# Patient Record
Sex: Male | Born: 1945 | Race: White | Hispanic: No | Marital: Married | Smoking: Former smoker
Health system: Southern US, Community
[De-identification: ages and names within clinical notes are randomized; demographics above are authoritative.]

## PROBLEM LIST (undated history)

## (undated) DIAGNOSIS — I82409 Acute embolism and thrombosis of unspecified deep veins of unspecified lower extremity: Secondary | ICD-10-CM

## (undated) DIAGNOSIS — M2540 Effusion, unspecified joint: Secondary | ICD-10-CM

## (undated) DIAGNOSIS — N189 Chronic kidney disease, unspecified: Secondary | ICD-10-CM

## (undated) DIAGNOSIS — M1990 Unspecified osteoarthritis, unspecified site: Secondary | ICD-10-CM

## (undated) DIAGNOSIS — G4730 Sleep apnea, unspecified: Secondary | ICD-10-CM

## (undated) DIAGNOSIS — M2550 Pain in unspecified joint: Secondary | ICD-10-CM

## (undated) DIAGNOSIS — Z8719 Personal history of other diseases of the digestive system: Secondary | ICD-10-CM

## (undated) DIAGNOSIS — Z860100 Personal history of colon polyps, unspecified: Secondary | ICD-10-CM

## (undated) DIAGNOSIS — Z8711 Personal history of peptic ulcer disease: Secondary | ICD-10-CM

## (undated) DIAGNOSIS — I10 Essential (primary) hypertension: Secondary | ICD-10-CM

## (undated) DIAGNOSIS — E785 Hyperlipidemia, unspecified: Secondary | ICD-10-CM

## (undated) DIAGNOSIS — K219 Gastro-esophageal reflux disease without esophagitis: Secondary | ICD-10-CM

## (undated) DIAGNOSIS — Z8679 Personal history of other diseases of the circulatory system: Secondary | ICD-10-CM

## (undated) DIAGNOSIS — M109 Gout, unspecified: Secondary | ICD-10-CM

## (undated) DIAGNOSIS — I6529 Occlusion and stenosis of unspecified carotid artery: Secondary | ICD-10-CM

## (undated) DIAGNOSIS — Z9289 Personal history of other medical treatment: Secondary | ICD-10-CM

## (undated) HISTORY — DX: Essential (primary) hypertension: I10

## (undated) HISTORY — DX: Hyperlipidemia, unspecified: E78.5

## (undated) HISTORY — DX: Unspecified osteoarthritis, unspecified site: M19.90

## (undated) HISTORY — DX: Occlusion and stenosis of unspecified carotid artery: I65.29

## (undated) HISTORY — PX: ESOPHAGOGASTRODUODENOSCOPY: SHX1529

## (undated) HISTORY — DX: Personal history of other diseases of the circulatory system: Z86.79

## (undated) HISTORY — PX: COLONOSCOPY: SHX174

## (undated) HISTORY — PX: HERNIA REPAIR: SHX51

## (undated) HISTORY — DX: Acute embolism and thrombosis of unspecified deep veins of unspecified lower extremity: I82.409

## (undated) HISTORY — DX: Gout, unspecified: M10.9

## (undated) HISTORY — PX: COLON SURGERY: SHX602

## (undated) HISTORY — DX: Personal history of other diseases of the digestive system: Z87.19

## (undated) HISTORY — PX: CHOLECYSTECTOMY: SHX55

---

## 1998-12-11 ENCOUNTER — Ambulatory Visit (HOSPITAL_COMMUNITY): Admission: RE | Admit: 1998-12-11 | Discharge: 1998-12-11 | Payer: Self-pay | Admitting: Gastroenterology

## 1999-06-24 ENCOUNTER — Inpatient Hospital Stay (HOSPITAL_COMMUNITY): Admission: EM | Admit: 1999-06-24 | Discharge: 1999-06-27 | Payer: Self-pay | Admitting: Emergency Medicine

## 1999-06-25 ENCOUNTER — Encounter: Payer: Self-pay | Admitting: Gastroenterology

## 1999-06-26 ENCOUNTER — Encounter: Payer: Self-pay | Admitting: Gastroenterology

## 1999-07-28 HISTORY — PX: OTHER SURGICAL HISTORY: SHX169

## 1999-11-09 ENCOUNTER — Inpatient Hospital Stay (HOSPITAL_COMMUNITY): Admission: EM | Admit: 1999-11-09 | Discharge: 1999-11-11 | Payer: Self-pay | Admitting: Emergency Medicine

## 1999-11-09 ENCOUNTER — Encounter: Payer: Self-pay | Admitting: Emergency Medicine

## 1999-11-10 ENCOUNTER — Encounter: Payer: Self-pay | Admitting: Gastroenterology

## 1999-12-17 ENCOUNTER — Encounter: Payer: Self-pay | Admitting: Gastroenterology

## 1999-12-17 ENCOUNTER — Ambulatory Visit (HOSPITAL_COMMUNITY): Admission: RE | Admit: 1999-12-17 | Discharge: 1999-12-17 | Payer: Self-pay | Admitting: Gastroenterology

## 1999-12-17 ENCOUNTER — Inpatient Hospital Stay (HOSPITAL_COMMUNITY): Admission: EM | Admit: 1999-12-17 | Discharge: 1999-12-20 | Payer: Self-pay | Admitting: Emergency Medicine

## 1999-12-29 ENCOUNTER — Encounter: Payer: Self-pay | Admitting: Neurology

## 1999-12-29 ENCOUNTER — Encounter: Admission: RE | Admit: 1999-12-29 | Discharge: 1999-12-29 | Payer: Self-pay | Admitting: Neurology

## 2000-01-08 ENCOUNTER — Encounter: Payer: Self-pay | Admitting: Surgery

## 2000-01-12 ENCOUNTER — Encounter (INDEPENDENT_AMBULATORY_CARE_PROVIDER_SITE_OTHER): Payer: Self-pay | Admitting: *Deleted

## 2000-01-12 ENCOUNTER — Encounter: Payer: Self-pay | Admitting: Surgery

## 2000-01-12 ENCOUNTER — Inpatient Hospital Stay (HOSPITAL_COMMUNITY): Admission: RE | Admit: 2000-01-12 | Discharge: 2000-01-18 | Payer: Self-pay | Admitting: Surgery

## 2004-10-09 ENCOUNTER — Ambulatory Visit: Payer: Self-pay | Admitting: Oncology

## 2005-05-21 ENCOUNTER — Ambulatory Visit: Payer: Self-pay | Admitting: Oncology

## 2006-01-01 ENCOUNTER — Encounter: Admission: RE | Admit: 2006-01-01 | Discharge: 2006-01-01 | Payer: Self-pay | Admitting: Family Medicine

## 2006-04-02 ENCOUNTER — Ambulatory Visit (HOSPITAL_COMMUNITY): Admission: RE | Admit: 2006-04-02 | Discharge: 2006-04-02 | Payer: Self-pay | Admitting: Gastroenterology

## 2006-04-02 ENCOUNTER — Encounter (INDEPENDENT_AMBULATORY_CARE_PROVIDER_SITE_OTHER): Payer: Self-pay | Admitting: Specialist

## 2006-05-19 ENCOUNTER — Ambulatory Visit: Payer: Self-pay | Admitting: Oncology

## 2006-07-27 DIAGNOSIS — I82409 Acute embolism and thrombosis of unspecified deep veins of unspecified lower extremity: Secondary | ICD-10-CM

## 2006-07-27 HISTORY — PX: CAROTID ENDARTERECTOMY: SUR193

## 2006-07-27 HISTORY — DX: Acute embolism and thrombosis of unspecified deep veins of unspecified lower extremity: I82.409

## 2007-02-25 ENCOUNTER — Ambulatory Visit: Payer: Self-pay | Admitting: Vascular Surgery

## 2007-03-07 ENCOUNTER — Ambulatory Visit: Payer: Self-pay | Admitting: Surgery

## 2007-04-11 ENCOUNTER — Ambulatory Visit: Payer: Self-pay | Admitting: Surgery

## 2007-04-21 ENCOUNTER — Encounter: Payer: Self-pay | Admitting: Surgery

## 2007-04-21 ENCOUNTER — Ambulatory Visit: Payer: Self-pay | Admitting: Surgery

## 2007-04-21 ENCOUNTER — Inpatient Hospital Stay (HOSPITAL_COMMUNITY): Admission: AD | Admit: 2007-04-21 | Discharge: 2007-04-22 | Payer: Self-pay | Admitting: Surgery

## 2007-05-09 ENCOUNTER — Ambulatory Visit: Payer: Self-pay | Admitting: Surgery

## 2007-05-12 ENCOUNTER — Ambulatory Visit: Payer: Self-pay | Admitting: Oncology

## 2007-05-17 LAB — COMPREHENSIVE METABOLIC PANEL
ALT: 18 U/L (ref 0–53)
AST: 17 U/L (ref 0–37)
Albumin: 4.4 g/dL (ref 3.5–5.2)
Alkaline Phosphatase: 94 U/L (ref 39–117)
BUN: 27 mg/dL — ABNORMAL HIGH (ref 6–23)
CO2: 23 mEq/L (ref 19–32)
Calcium: 9.3 mg/dL (ref 8.4–10.5)
Chloride: 103 mEq/L (ref 96–112)
Creatinine, Ser: 1.59 mg/dL — ABNORMAL HIGH (ref 0.40–1.50)
Glucose, Bld: 99 mg/dL (ref 70–99)
Potassium: 4.5 mEq/L (ref 3.5–5.3)
Sodium: 138 mEq/L (ref 135–145)
Total Bilirubin: 0.6 mg/dL (ref 0.3–1.2)
Total Protein: 7.2 g/dL (ref 6.0–8.3)

## 2007-05-17 LAB — CBC WITH DIFFERENTIAL/PLATELET
BASO%: 0.7 % (ref 0.0–2.0)
Basophils Absolute: 0 10*3/uL (ref 0.0–0.1)
EOS%: 3.4 % (ref 0.0–7.0)
Eosinophils Absolute: 0.2 10*3/uL (ref 0.0–0.5)
HCT: 36.7 % — ABNORMAL LOW (ref 38.7–49.9)
HGB: 12.8 g/dL — ABNORMAL LOW (ref 13.0–17.1)
LYMPH%: 24.5 % (ref 14.0–48.0)
MCH: 28.4 pg (ref 28.0–33.4)
MCHC: 35 g/dL (ref 32.0–35.9)
MCV: 81.1 fL — ABNORMAL LOW (ref 81.6–98.0)
MONO#: 0.5 10*3/uL (ref 0.1–0.9)
MONO%: 8.4 % (ref 0.0–13.0)
NEUT#: 3.4 10*3/uL (ref 1.5–6.5)
NEUT%: 63 % (ref 40.0–75.0)
Platelets: 247 10*3/uL (ref 145–400)
RBC: 4.52 10*6/uL (ref 4.20–5.71)
RDW: 13.6 % (ref 11.2–14.6)
WBC: 5.4 10*3/uL (ref 4.0–10.0)
lymph#: 1.3 10*3/uL (ref 0.9–3.3)

## 2007-05-17 LAB — IRON AND TIBC
%SAT: 29 % (ref 20–55)
Iron: 88 ug/dL (ref 42–165)
TIBC: 307 ug/dL (ref 215–435)
UIBC: 219 ug/dL

## 2007-05-17 LAB — FERRITIN: Ferritin: 204 ng/mL (ref 22–322)

## 2007-05-17 LAB — LACTATE DEHYDROGENASE: LDH: 135 U/L (ref 94–250)

## 2007-05-19 LAB — TRANSFERRIN RECEPTOR, SOLUABLE: Transferrin Receptor, Soluble: 15.2 nmol/L

## 2007-07-28 HISTORY — PX: RENAL ARTERY STENT: SHX2321

## 2007-08-01 ENCOUNTER — Ambulatory Visit: Payer: Self-pay | Admitting: Surgery

## 2007-12-14 ENCOUNTER — Encounter: Admission: RE | Admit: 2007-12-14 | Discharge: 2007-12-14 | Payer: Self-pay | Admitting: Family Medicine

## 2007-12-29 ENCOUNTER — Encounter: Admission: RE | Admit: 2007-12-29 | Discharge: 2007-12-29 | Payer: Self-pay | Admitting: Family Medicine

## 2008-01-23 ENCOUNTER — Ambulatory Visit: Payer: Self-pay | Admitting: Surgery

## 2008-02-08 ENCOUNTER — Inpatient Hospital Stay (HOSPITAL_COMMUNITY): Admission: AD | Admit: 2008-02-08 | Discharge: 2008-02-09 | Payer: Self-pay | Admitting: Surgery

## 2008-02-09 ENCOUNTER — Ambulatory Visit: Payer: Self-pay | Admitting: Surgery

## 2008-03-19 ENCOUNTER — Ambulatory Visit: Payer: Self-pay | Admitting: Surgery

## 2008-05-16 ENCOUNTER — Ambulatory Visit: Payer: Self-pay | Admitting: Oncology

## 2008-05-18 LAB — CBC WITH DIFFERENTIAL/PLATELET
BASO%: 0.4 % (ref 0.0–2.0)
Basophils Absolute: 0 10*3/uL (ref 0.0–0.1)
EOS%: 3 % (ref 0.0–7.0)
Eosinophils Absolute: 0.2 10*3/uL (ref 0.0–0.5)
HCT: 41.4 % (ref 38.7–49.9)
HGB: 14.2 g/dL (ref 13.0–17.1)
LYMPH%: 20.2 % (ref 14.0–48.0)
MCH: 27.9 pg — ABNORMAL LOW (ref 28.0–33.4)
MCHC: 34.3 g/dL (ref 32.0–35.9)
MCV: 81.3 fL — ABNORMAL LOW (ref 81.6–98.0)
MONO#: 0.4 10*3/uL (ref 0.1–0.9)
MONO%: 7.2 % (ref 0.0–13.0)
NEUT#: 4.1 10*3/uL (ref 1.5–6.5)
NEUT%: 69.2 % (ref 40.0–75.0)
Platelets: 208 10*3/uL (ref 145–400)
RBC: 5.09 10*6/uL (ref 4.20–5.71)
RDW: 14.4 % (ref 11.2–14.6)
WBC: 5.9 10*3/uL (ref 4.0–10.0)
lymph#: 1.2 10*3/uL (ref 0.9–3.3)

## 2008-05-22 LAB — COMPREHENSIVE METABOLIC PANEL
ALT: 17 U/L (ref 0–53)
AST: 18 U/L (ref 0–37)
Albumin: 4.6 g/dL (ref 3.5–5.2)
Alkaline Phosphatase: 97 U/L (ref 39–117)
BUN: 18 mg/dL (ref 6–23)
CO2: 25 mEq/L (ref 19–32)
Calcium: 9.2 mg/dL (ref 8.4–10.5)
Chloride: 104 mEq/L (ref 96–112)
Creatinine, Ser: 1.44 mg/dL (ref 0.40–1.50)
Glucose, Bld: 111 mg/dL — ABNORMAL HIGH (ref 70–99)
Potassium: 4.2 mEq/L (ref 3.5–5.3)
Sodium: 140 mEq/L (ref 135–145)
Total Bilirubin: 0.7 mg/dL (ref 0.3–1.2)
Total Protein: 7.1 g/dL (ref 6.0–8.3)

## 2008-05-22 LAB — TRANSFERRIN RECEPTOR, SOLUABLE: Transferrin Receptor, Soluble: 22 nmol/L

## 2008-05-22 LAB — FERRITIN: Ferritin: 44 ng/mL (ref 22–322)

## 2008-05-22 LAB — IRON AND TIBC
%SAT: 23 % (ref 20–55)
Iron: 73 ug/dL (ref 42–165)
TIBC: 317 ug/dL (ref 215–435)
UIBC: 244 ug/dL

## 2008-05-22 LAB — LACTATE DEHYDROGENASE: LDH: 136 U/L (ref 94–250)

## 2008-09-17 ENCOUNTER — Ambulatory Visit: Payer: Self-pay | Admitting: Surgery

## 2009-03-26 ENCOUNTER — Ambulatory Visit: Payer: Self-pay | Admitting: Surgery

## 2009-05-22 ENCOUNTER — Ambulatory Visit: Payer: Self-pay | Admitting: Oncology

## 2009-05-24 LAB — CBC WITH DIFFERENTIAL/PLATELET
BASO%: 0.5 % (ref 0.0–2.0)
Basophils Absolute: 0 10*3/uL (ref 0.0–0.1)
EOS%: 2.2 % (ref 0.0–7.0)
Eosinophils Absolute: 0.2 10*3/uL (ref 0.0–0.5)
HCT: 44.3 % (ref 38.4–49.9)
HGB: 15.1 g/dL (ref 13.0–17.1)
LYMPH%: 22.1 % (ref 14.0–49.0)
MCH: 28.7 pg (ref 27.2–33.4)
MCHC: 34.1 g/dL (ref 32.0–36.0)
MCV: 84.1 fL (ref 79.3–98.0)
MONO#: 0.4 10*3/uL (ref 0.1–0.9)
MONO%: 5.7 % (ref 0.0–14.0)
NEUT#: 5.4 10*3/uL (ref 1.5–6.5)
NEUT%: 69.5 % (ref 39.0–75.0)
Platelets: 243 10*3/uL (ref 140–400)
RBC: 5.26 10*6/uL (ref 4.20–5.82)
RDW: 13.6 % (ref 11.0–14.6)
WBC: 7.8 10*3/uL (ref 4.0–10.3)
lymph#: 1.7 10*3/uL (ref 0.9–3.3)

## 2009-05-24 LAB — COMPREHENSIVE METABOLIC PANEL
ALT: 16 U/L (ref 0–53)
AST: 16 U/L (ref 0–37)
Albumin: 4.4 g/dL (ref 3.5–5.2)
Alkaline Phosphatase: 111 U/L (ref 39–117)
BUN: 14 mg/dL (ref 6–23)
CO2: 26 mEq/L (ref 19–32)
Calcium: 9.4 mg/dL (ref 8.4–10.5)
Chloride: 104 mEq/L (ref 96–112)
Creatinine, Ser: 1.2 mg/dL (ref 0.40–1.50)
Glucose, Bld: 112 mg/dL — ABNORMAL HIGH (ref 70–99)
Potassium: 4.1 mEq/L (ref 3.5–5.3)
Sodium: 141 mEq/L (ref 135–145)
Total Bilirubin: 0.7 mg/dL (ref 0.3–1.2)
Total Protein: 6.9 g/dL (ref 6.0–8.3)

## 2009-05-24 LAB — LACTATE DEHYDROGENASE: LDH: 152 U/L (ref 94–250)

## 2009-05-24 LAB — IRON AND TIBC
%SAT: 29 % (ref 20–55)
Iron: 93 ug/dL (ref 42–165)
TIBC: 318 ug/dL (ref 215–435)
UIBC: 225 ug/dL

## 2009-05-24 LAB — FERRITIN: Ferritin: 105 ng/mL (ref 22–322)

## 2009-09-23 ENCOUNTER — Ambulatory Visit: Payer: Self-pay | Admitting: Surgery

## 2010-04-04 ENCOUNTER — Ambulatory Visit: Payer: Self-pay | Admitting: Surgery

## 2010-05-21 ENCOUNTER — Ambulatory Visit: Payer: Self-pay | Admitting: Oncology

## 2010-05-23 LAB — CBC WITH DIFFERENTIAL/PLATELET
BASO%: 0.6 % (ref 0.0–2.0)
Basophils Absolute: 0 10*3/uL (ref 0.0–0.1)
EOS%: 3.1 % (ref 0.0–7.0)
Eosinophils Absolute: 0.2 10*3/uL (ref 0.0–0.5)
HCT: 41.5 % (ref 38.4–49.9)
HGB: 14.3 g/dL (ref 13.0–17.1)
LYMPH%: 22.3 % (ref 14.0–49.0)
MCH: 28.7 pg (ref 27.2–33.4)
MCHC: 34.5 g/dL (ref 32.0–36.0)
MCV: 83.3 fL (ref 79.3–98.0)
MONO#: 0.4 10*3/uL (ref 0.1–0.9)
MONO%: 7 % (ref 0.0–14.0)
NEUT#: 4.2 10*3/uL (ref 1.5–6.5)
NEUT%: 67 % (ref 39.0–75.0)
Platelets: 228 10*3/uL (ref 140–400)
RBC: 4.98 10*6/uL (ref 4.20–5.82)
RDW: 13.7 % (ref 11.0–14.6)
WBC: 6.2 10*3/uL (ref 4.0–10.3)
lymph#: 1.4 10*3/uL (ref 0.9–3.3)

## 2010-05-23 LAB — COMPREHENSIVE METABOLIC PANEL
ALT: 13 U/L (ref 0–53)
AST: 17 U/L (ref 0–37)
Albumin: 4.2 g/dL (ref 3.5–5.2)
Alkaline Phosphatase: 125 U/L — ABNORMAL HIGH (ref 39–117)
BUN: 14 mg/dL (ref 6–23)
CO2: 24 mEq/L (ref 19–32)
Calcium: 9.2 mg/dL (ref 8.4–10.5)
Chloride: 104 mEq/L (ref 96–112)
Creatinine, Ser: 1.26 mg/dL (ref 0.40–1.50)
Glucose, Bld: 106 mg/dL — ABNORMAL HIGH (ref 70–99)
Potassium: 3.9 mEq/L (ref 3.5–5.3)
Sodium: 140 mEq/L (ref 135–145)
Total Bilirubin: 0.8 mg/dL (ref 0.3–1.2)
Total Protein: 6.5 g/dL (ref 6.0–8.3)

## 2010-05-23 LAB — FERRITIN: Ferritin: 126 ng/mL (ref 22–322)

## 2010-05-23 LAB — IRON AND TIBC
%SAT: 21 % (ref 20–55)
Iron: 62 ug/dL (ref 42–165)
TIBC: 299 ug/dL (ref 215–435)
UIBC: 237 ug/dL

## 2010-08-17 ENCOUNTER — Encounter: Payer: Self-pay | Admitting: Interventional Radiology

## 2010-10-06 ENCOUNTER — Other Ambulatory Visit (INDEPENDENT_AMBULATORY_CARE_PROVIDER_SITE_OTHER): Payer: Medicare Other

## 2010-10-06 DIAGNOSIS — Z48812 Encounter for surgical aftercare following surgery on the circulatory system: Secondary | ICD-10-CM

## 2010-10-06 DIAGNOSIS — I6529 Occlusion and stenosis of unspecified carotid artery: Secondary | ICD-10-CM

## 2010-10-08 NOTE — Procedures (Unsigned)
CAROTID DUPLEX EXAM  INDICATION:  Right CEA 04/21/2007.  HISTORY: Diabetes:  No. Cardiac:  No. Hypertension:  Yes. Smoking:  Previous. Previous Surgery:  Right CEA. CV History: Amaurosis Fugax No, Paresthesias No, Hemiparesis No                                      RIGHT             LEFT Brachial systolic pressure:         162               162 Brachial Doppler waveforms:         WNL               WNL Vertebral direction of flow:        Antegrade         Antegrade DUPLEX VELOCITIES (cm/sec) CCA peak systolic                   131               123456 ECA peak systolic                   112               [id number] ICA peak systolic                   87                [id number] ICA end diastolic                   22                70 PLAQUE MORPHOLOGY:                                    Heterogeneous PLAQUE AMOUNT:                      N/A               Moderate PLAQUE LOCATION:                                      CCA, ICA, ECA  IMPRESSION: 1. Widely patent right common carotid artery without evidence of     restenosis or hyperplasia. 2. 60% to 79% left internal carotid artery stenosis beginning in the     common carotid artery.  This is an increase from prior exam of     09/23/2009. 3. Antegrade bilateral vertebral arteries. 4. Abnormal left external carotid artery. 5. Results were communicated to Dr. Trula Slade on 10/06/2010.  ___________________________________________ V. Leia Alf, MD  LT/MEDQ  D:  10/06/2010  T:  10/06/2010  Job:  [id number]

## 2010-12-09 NOTE — Procedures (Signed)
RENAL ARTERY DUPLEX EVALUATION   INDICATION:  Right renal artery stent.   HISTORY:  Diabetes:  No.  Cardiac:  No.  Hypertension:  Yes.  Smoking:  Previous.   RENAL ARTERY DUPLEX FINDINGS:  Aorta-Proximal:  58 cm/s  Aorta-Mid:  67 cm/s  Aorta-Distal:  104 cm/s  Celiac Artery Origin:  116 cm/s  SMA Origin:  209 cm/s                                    RIGHT               LEFT  Renal Artery Origin:             124 cm/s            154 cm/s  Renal Artery Proximal:           94 cm/s             135 cm/s  Renal Artery Mid:                54 cm/s             129 cm/s  Renal Artery Distal:             41 cm/s             83 cm/s  Hilar Acceleration Time (AT):  Renal-Aortic Ratio (RAR):        1.9                 2.3  Kidney Size:                     10.5 cm             0000000 cm  End Diastolic Ratio (EDR):  Resistive Index (RI):            0.77                0.69   IMPRESSION:  1. Patent right renal artery stent with no increase in Doppler      velocities of the bilateral renal arteries noted.  2. The right intrarenal resistive index is abnormal with the left      intrarenal resistive index and bilateral kidney length measurements      falling within normal limits.  3. Stable Doppler velocities noted when compared to the previous      studies.   ___________________________________________  V. Leia Alf, MD   CH/MEDQ  D:  03/26/2009  T:  03/26/2009  Job:  [id number]

## 2010-12-09 NOTE — Assessment & Plan Note (Signed)
OFFICE VISIT   James [name], James [name]  DOB:  1946/05/10                                       05/09/2007  [id number]   REASON FOR VISIT:  Status post right carotid endarterectomy.   HISTORY:  This is a 65 year old gentleman who underwent right carotid  endarterectomy with bovine pericardial patch performed on 04/21/2007.  This was done secondary to high grade stenosis as well as  atherosclerotic changes and Hollenhorst plaque on ophthalmologic exam.  Patient had an uncomplicated postoperative course and was discharged  home the day following surgery.  In the interim, since being home, he  has had no complaints.  He has not had any pain.  He has not had any  symptoms consistent with TIA or stroke.   PHYSICAL EXAMINATION:  Heart rate 63, respirations 18, blood pressure on  the left is 140/85, on the right is 147/82.  General:  He is well-  appearing in no acute distress.  Incision:  His right incision is well  healed.  There is no evidence of infection.  Neuro:  The patient has  normal strength, sensation, and motor function in his bilateral upper  and lower extremities.  His tongue is in the midline.  He does not have  any hoarseness.   DIAGNOSTIC STUDIES:  Duplex evaluation of the right carotid was  performed today with no evidence of restenosis in the right internal  carotid artery.   IMPRESSION:  Doing well status post right carotid end arterectomy.   PLAN:  Patient is instructed to continue taking an aspirin every day.  He will follow up in six months with repeat duplex ultrasound.   Eldridge Abrahams, MD  Electronically Signed   VWB/MEDQ  [name]:  05/09/2007  T:  05/10/2007  Job:  [id number]

## 2010-12-09 NOTE — Assessment & Plan Note (Signed)
OFFICE VISIT   KNIGHTON, GEYMAN  DOB:  [birth year]                                       03/07/2007  [id number]   HISTORY:  This is a 65 year old gentleman, referred for evaluation of  carotid disease by Dr. Laverta Baltimore.  Patient comes with an ultrasound.  Per the  patient's report, he was seen in July by the ophthalmologist, who  suggested he had atherosclerotic changes within his eyes.  He was  subsequently referred for a carotid duplex, which revealed high-grade  stenosis on the right and moderate on the left.  He denies having any  symptoms.  Specifically, he denies numbness or weakness in either  extremity.  He denies amaurosis fugax.  He denies episodes of aphasia.   REVIEW OF SYMPTOMS:  As per above.  Denies chest pain, weight-loss, no shortness of breath.  No GI or GU discomfort.  Neurologically, he is without complaints.  VASCULAR:  He denies mini-strokes, claudication.   PAST MEDICAL HISTORY:  Significant for high blood pressure, high  cholesterol.   PAST SURGICAL HISTORY:  Significant for cholecystectomy and small bowel  resection.   FAMILY HISTORY:  No history of heart disease, etc.   SOCIAL HISTORY:  He has a history of tobacco use.  He does not drink.   PHYSICAL EXAMINATION:  Heart rate 73, blood pressure left arm is 198/88,  blood pressure in the right arm is 182/89.  Generally, he is well-  appearing, in no acute distress.  HEENT:  Normocephalic, atraumatic.  Neck is without carotid bruits.  Cardiovascular is regular rate and  rhythm.  Pulmonary:  Respirations are nonlabored.  Abdomen is soft,  nontender, midline incision is well-healed.  Extremities are warm and  well-perfused.   DIAGNOSTIC STUDIES:  Duplex exam was reviewed today.  This revealed an  80-99% stenosis in the right internal carotid artery and 40-59% stenosis  in the left internal carotid artery.  The lesion in the right is in the  proximal internal carotid.   ASSESSMENT AND PLAN:  Asymptomatic carotid stenosis.   PLAN:  At this point in time, I recommended that the patient be placed  on an aspirin.  We talked about blood pressure management, as well as  cholesterol management.  We discussed the risks and benefits of an  operation, including risk of perioperative stroke, versus medical  management.  We also discussed incidence of nerve injury, as well as  bleeding and cardiac risks.  At this point in time, patient is leaning  towards an operation.  However, he has an appointment to see his  cardiologist this afternoon for cardiac evaluation.  He will call the  office once this has been done and schedule a followup appointment.   Eldridge Abrahams, MD  Electronically Signed   VWB/MEDQ  D:  03/07/2007  T:  03/09/2007  Job:  [id number]

## 2010-12-09 NOTE — Assessment & Plan Note (Signed)
OFFICE VISIT   JARQUEZ, MCKNIGHT D  DOB:  02-Apr-1946                                       09/17/2008  [id number]   REASON FOR VISIT:  Follow-up.   HISTORY:  This is a 64 year old gentleman who underwent a right carotid  endarterectomy on April 21, 2007, for asymptomatic disease.  He has  also undergone right renal artery stenting on February 08, 2008, for  hypertension and elevated creatinine.  He has had 1 episode of GI  bleeding which has been attributed to Plavix and he subsequently stopped  that.  He is continuing to take 2 baby aspirin.  He has had no  significant interval medical history.   PHYSICAL EXAMINATION:  Vital Signs:  Blood pressure is 154/79.  He is  well-appearing, in no distress.  Neck:  supple.  No JVD.  No carotid  bruits.  Incision is well-healed.  Cardiovascular:  Regular rate and  rhythm.  Abdomen:  Soft, nontender.  No bruits.  Extremities:  Warm and  well-perfused.   DIAGNOSTIC STUDIES:  Carotid duplex reveals widely patent right carotid  arterial system with no stenosis.  The left shows 40% to 59% stenosis.   Renal duplex reveals no evidence of right renal artery stenosis.   ASSESSMENT/PLAN:  Status post right carotid endarterectomy and right  renal artery stent.   Plan:  The patient will continue on carotid and renal ultrasound  protocols.  He will continue with his medical management of his vascular  disease including aspirin, blood pressure medication and cholesterol  regimen.  I will plan on seeing him back in 1 year, unless he has  interval issues with his duplex screening.   Eldridge Abrahams, MD  Electronically Signed   VWB/MEDQ  D:  09/17/2008  T:  09/18/2008  Job:  [id number]   cc:   Shanon Rosser, Dr.

## 2010-12-09 NOTE — Procedures (Signed)
CAROTID DUPLEX EXAM   INDICATION:  Follow up carotid artery disease.   HISTORY:  Diabetes:  No.  Cardiac:  No.  Hypertension:  Yes.  Smoking:  Previous.  Previous Surgery:  Right CEA with BPP 04/21/07 by Dr. Trula Slade.  CV History:  Asymptomatic.  Amaurosis Fugax No, Paresthesias No, Hemiparesis No.                                       RIGHT             LEFT  Brachial systolic pressure:         140               126  Brachial Doppler waveforms:         WNL               WNL  Vertebral direction of flow:        Antegrade         Antegrade  DUPLEX VELOCITIES (cm/sec)  CCA peak systolic                   136               123456  ECA peak systolic                   109               [id number]  ICA peak systolic                   97                [id number]  ICA end diastolic                   26                55  PLAQUE MORPHOLOGY:                                    Heterogenous  PLAQUE AMOUNT:                      None              Moderate  PLAQUE LOCATION:                                      ICA, ECA   IMPRESSION:  1. Right internal carotid artery shows no evidence of restenosis,      status post carotid endarterectomy.  2. Left internal carotid artery shows evidence of 40-59% stenosis.  3. Left external carotid artery stenosis.  4. No significant changes from previous study, 09/17/2008.   ___________________________________________  V. Leia Alf, MD   AS/MEDQ  D:  09/23/2009  T:  09/23/2009  Job:  [id number]

## 2010-12-09 NOTE — Assessment & Plan Note (Signed)
OFFICE VISIT   James [name], James [name]  DOB:  [birth year]                                       01/23/2008  [id number]   REASON FOR VISIT:  Renal artery stenosis.   HISTORY:  This is a 65 year old gentleman who underwent right carotid  endarterectomy on 04/21/2007.  He has done well from this perspective  and comes in now today to evaluate for right renal artery stenosis.  He  recently has had an increase in his creatinine.  He underwent a renal  duplex which showed a decrease in size of his right kidney, and an MRI  suggested high grade right renal artery stenosis.  He comes in today for  followup.  He is on 2 medicines for blood pressure.  His blood pressure  has been relatively well controlled.  He has had an increase in his  creatinine to 1.7.  Otherwise he is doing well without complaints.   EXAM:  Blood pressure 160/81, pulse 81.  General:  Well-appearing, no  acute distress.  His right carotid endarterectomy incision is well  healed.  He is neurologically intact.  There are no abdominal bruits.   ASSESSMENT/PLAN:  Right renal stenosis.   PLAN:  The patient will be scheduled for an arteriogram with possible  stenting of his right renal artery.  I have scheduled this for Thursday,  July 16th.  Given the patient's increase in his creatinine, he will be  admitted today prior to this procedure for IV hydration.  At that time  he within also receive Mucomyst with the plan of doing bicarb the  morning of his procedure.   Eldridge Abrahams, MD  Electronically Signed   VWB/MEDQ  [name]:  01/23/2008  T:  01/24/2008  Job:  [id number]   cc:   Dr. Shanon Rosser

## 2010-12-09 NOTE — Op Note (Signed)
NAMETERROL, PUSKARICH           ACCOUNT NO.:  [id number]   MEDICAL RECORD NO.:  [id number]          PATIENT TYPE:  OIB   LOCATION:  [id number]                         FACILITY:  [facility]   PHYSICIAN:  Theotis Burrow IV, MDDATE OF BIRTH:  Oct 07, 1945   DATE OF PROCEDURE:  DATE OF DISCHARGE:                               OPERATIVE REPORT   PREOPERATIVE DIAGNOSIS:  Right carotid stenosis.   POSTOPERATIVE DIAGNOSIS:  Right carotid stenosis.   PROCEDURE:  Right carotid endarterectomy with bovine pericardial patch.   CO-SURGEONJudeth Cornfield. Scot Dock, M.D.   ANESTHESIA:  General.   ESTIMATED BLOOD LOSS:  150 mL.   DRAINS:  Jackson-Pratt in right neck.   FINDINGS:  95% stenosis in the right internal carotid.   INDICATIONS FOR PROCEDURE:  This is a 65 year old who initially  presented to me with an ultrasound finding of 80-99% stenosis.  At this  time, patient was asymptomatic.  We had scheduled him for carotid  endarterectomy today.  Upon interviewing him prior to the operation, he  apparently has had some periodic numbness in his left hand over the  weekend.  I am not sure if this represents symptomatic TIAs or not.  Regardless, he had no deficits prior to his surgery and therefore we  have proceeded.   DESCRIPTION OF PROCEDURE:  The patient was identified in the holding  area and taken to room 8.  He was placed supine on the table.  General  endotracheal anesthesia was administered.  A time-out was called and  perioperative antibiotics were administered.  Incision was made along  the anterior border of the sternocleidomastoid.  Bovie cautery was used  to dissect through the subcutaneous tissue and divide the platysma  muscle.  Weitlaner retractors were then placed.  The internal jugular  vein was identified and mobilized into the lateral aspect of the wound.  Dissection was then carried down sharply to the common carotid artery.  The common carotid artery was then exposed  proximally and distally and  an umbilical tape was passed.  The common facial vein was then  identified and divided between 2-0 silk ties.  At this point, the  superior thyroid and external carotid artery were individually isolated  and a red vest loop was placed around the external carotid and a 2-0 tie  around the superior thyroid artery.  Next, I began dissecting the  internal carotid distal.  This was a very high bifurcation of his  carotid.  The hypoglossal nerve was identified and skeletonized and this  enabled [place] to get to a distance of healthy appearing internal carotid  artery.  Once this was done and felt there was adequate exposure to the  internal carotid, patient was systemically heparinized.  Stump pressures  were checked.  Stump pressure had a mean of 40.  For that reason, I  elected not to shunt given the location of the lesion, as well as  adequate perfusion pressure by stump pressure measurements.  Fogarty  clamp was placed proximally on the common carotid artery.  Vessels were  tightened on the external and superior thyroid and a Gregory clamp  was  placed on the internal carotid.  A #11 blade was used to make an  arteriotomy and this was extended with Pott scissors.  A Penfield  elevator was used to perform an endarterectomy.  Heparinized saline was  then used to irrigate the endarterectomized bed to make sure there were  no intimal flaps.  The distal end of the endarterectomy had to be tacked  down with three 7-0 Prolene sutures.  Next, a Bovie pericardial patch  was selected.  It was cut to fit the size of the arteriotomy.  It was  sutured into place using a 6-0 Prolene.  Prior to completion of the  anastomosis, the internal carotid was opened for back-bleeding to flush  the internal carotid out.  This was then reclamped, following by  flushing of the external carotid and the common carotid arteries.  The  anastomosis was completed and the clamp was released, first off  the  internal carotid, followed by the common carotid artery, and ultimately  internal carotid.  Two repair sutures had to be placed at the toe of the  graft.  At this point in time, everything was hemostatic.  Continuous  wave Doppler was used to evaluate the signal in the common, external and  internal carotid arteries, which were normal in auscultation.  Next, the  wound was copiously irrigated.  A drain was placed.  The fascia of the  sternocleidomastoid was reapproximated with interrupted 3-0 Vicryl.  The  platysma muscle was then closed using interrupted 3-0 Vicryl.  The skin  was closed with a running 4-0 Vicryl.  The drain was sutured into place  with a 2-0 nylon.  Dermabond was used on the skin.  Patient tolerated  the procedure well.  He was successfully extubated and found to be  moving all 4 extremities.  He was then taken to the PACU in stable  condition.      Eldridge Abrahams, MD  Electronically Signed     VWB/MEDQ  D:  04/21/2007  T:  04/21/2007  Job:  [id number]

## 2010-12-09 NOTE — Procedures (Signed)
CAROTID DUPLEX EXAM   INDICATION:  Left eye problem (per eye doctor).   HISTORY:  Diabetes:  No  Cardiac:  No  Hypertension:  Yes  Smoking:  Quit in 2001 after smoking 3 packs per day for 15-20 years  Previous Surgery:  No  CV History:  Amaurosis Fugax No  Paresthesias No  Hemiparesis No                                       RIGHT             LEFT  Brachial systolic pressure:         156               150  Brachial Doppler waveforms:         Triphasic         Triphasic  Vertebral direction of flow:        Antegrade         Antegrade  DUPLEX VELOCITIES (cm/sec)  CCA peak systolic                   114               123456  ECA peak systolic                   129               [id number]  ICA peak systolic                   450               123456  ICA end diastolic                   199               58  PLAQUE MORPHOLOGY:                  Calcified         Mixed  PLAQUE AMOUNT:                      Large             Mild to moderate  PLAQUE LOCATION:                    ICA               ICA   IMPRESSION:  80-99% right internal carotid artery stenosis.  40-59% left internal carotid artery stenosis.  A preliminary report was called to Dr. Hayden Pedro office, and they requested  that the patient be seen by one of our surgeons in the near future.  The  patient was set up with a follow-up appointment a week from Monday,  which will be March 07, 2007.   ___________________________________________  Nelda Severe. Kellie Simmering, M.D.   DP/MEDQ  D:  02/25/2007  T:  02/26/2007  Job:  [id number]

## 2010-12-09 NOTE — Assessment & Plan Note (Signed)
OFFICE VISIT   SIG, CLISHAM D  DOB:  1945/11/04                                       03/19/2008  [id number]   REASON FOR VISIT:  Followup.   HISTORY:  This is a 65 year old gentleman who is status post right  carotid endarterectomy on April 21, 2007 for asymptomatic disease.  He most recently was found to have an increase in his creatinine as well  as a renal duplex which shows a right renal artery stenosis which was  also suggested by MRI.  He underwent arteriogram and stenting of right  renal artery stenosis on February 08, 2008.  Comes back in today for  followup.  He states he did have an episode of GI bleeding, which has  been attributed to Plavix, which he has stopped.  He does continue to  take an aspirin.   PHYSICAL EXAMINATION:  He is in no acute distress.  His blood pressure  is 159/82, pulse 69.  His extremities are warm and well perfused.  He is  neurologically intact.   DIAGNOSTIC STUDIES:  The patient had carotid duplexes to be his 1-year  study which shows no evidence of restenosis status post carotid  endarterectomy.  The left side shows 40-59% stenosis at the lower end of  the range.  Renal duplex was also performed.  The right kidney measures  9.1 cm compared the left at 11.6 cm.  There is no evidence of renal  artery stenosis.   ASSESSMENT/PLAN:  Status post right carotid endarterectomy and right  renal artery stent.   PLAN:  1. Carotid stenosis:  The patient's carotid arteries are widely patent      by duplex today.  He will continue on a carotid ultrasound      protocol.  2. Renal stenosis.  The patient's renal stenosis has been corrected by      a, right renal stenting.  He did have an episode of GI bleeding and      has now stopped his Plavix.  We discussed that he should at least      continue his aspirin therapy.  I am a little concerned that he is      stopping his Plavix.  However, the GI bleed seems to be a  more      pressing issue.  He will have follow up renal duplex in 6 months.   Eldridge Abrahams, MD  Electronically Signed   VWB/MEDQ  D:  03/19/2008  T:  03/20/2008  Job:  [id number]   cc:   Dr. Shanon Rosser

## 2010-12-09 NOTE — Procedures (Signed)
RENAL ARTERY DUPLEX EVALUATION   INDICATION:  Right renal artery stent.   HISTORY:  Diabetes:  No.  Cardiac:  No.  Hypertension:  Yes.  Smoking:  Previous.   RENAL ARTERY DUPLEX FINDINGS:  Aorta-Proximal:  80 cm/s  Aorta-Mid:  82 cm/s  Aorta-Distal:  105 cm/s  Celiac Artery Origin:  119 cm/s  SMA Origin:  209 cm/s                                    RIGHT               LEFT  Renal Artery Origin:             140 cm/s            131 cm/s  Renal Artery Proximal:           123 cm/s            127 cm/s  Renal Artery Mid:                72 cm/s             106 cm/s  Renal Artery Distal:             69 cm/s             71 cm/s  Hilar Acceleration Time (AT):  Renal-Aortic Ratio (RAR):        1.75                1.64  Kidney Size:                     10.6 cm             [id number] cm  End Diastolic Ratio (EDR):  Resistive Index (RI):            0.76                0.74   IMPRESSION:  1. Patent right renal artery stent with no increase in Doppler      velocities of the bilateral renal arteries noted.  2. Right resistive index is slightly abnormal.  Left is within normal      limits.  3. Bilateral kidneys appear within normal limits with respect to shape      and size.  4. No significant changes from previous study of 03/26/2009.   ___________________________________________  V. Leia Alf, MD   AS/MEDQ  D:  09/23/2009  T:  09/23/2009  Job:  [id number]

## 2010-12-09 NOTE — Discharge Summary (Signed)
NAMEABHAY, PIASECKI           ACCOUNT NO.:  [id number]   MEDICAL RECORD NO.:  [id number]          PATIENT TYPE:  INP   LOCATION:  [id number]                         FACILITY:  [facility]   PHYSICIAN:  Theotis Burrow IV, MDDATE OF BIRTH:  May 27, 1946   DATE OF ADMISSION:  04/21/2007  DATE OF DISCHARGE:  04/22/2007                               DISCHARGE SUMMARY   REASON FOR ADMISSION:  Right carotid stenosis.   PROCEDURES PERFORMED:  On September 25, patient underwent right carotid  endarterectomy without complication.   SUMMARY OF HOSPITAL COURSE:  A 65 year old gentleman who was admitted  the day following a right carotid endarterectomy which went without  difficulty.  Postoperatively, he was found to be neurologically intact.  He has done quite well overnight.  His drain was removed on the day  following surgery with normal output.  Again, he is neurologically  intact.  Patient will be scheduled to go home today.   DISCHARGE INSTRUCTIONS:  1. The patient is allowed to shower.  2. He should avoid driving while on narcotics.  3. He should gently cleanse the wound.  4. He should call if any signs of weakness, numbness or neurologic      deficit, otherwise he will follow up in two weeks' time.   DISCHARGE MEDICATIONS:  The patient will be discharged on his home  medications which include:  1. The only new medicine is Percocet.  2. He will also be on aspirin and resume all home medicines.      Eldridge Abrahams, MD  Electronically Signed     VWB/MEDQ  D:  04/22/2007  T:  04/22/2007  Job:  [id number]

## 2010-12-09 NOTE — Procedures (Signed)
CAROTID DUPLEX EXAM   INDICATION:  Followup evaluation of known carotid artery disease.   HISTORY:  Diabetes:  No.  Cardiac:  No.  Hypertension:  Yes.  Smoking:  Quit in 2001.  Previous Surgery:  Right carotid endarterectomy with bovine pericardial  patch, done on 04/21/2007, by Dr. Trula Slade.  CV History:  Patient reports no cerebrovascular symptoms at this time.  Amaurosis Fugax:  No, Paresthesias:  No, Hemiparesis:  No                                       RIGHT             LEFT  Brachial systolic pressure:         144               130  Brachial Doppler waveforms:         Triphasic         Triphasic  Vertebral direction of flow:        Antegrade         Antegrade  DUPLEX VELOCITIES (cm/sec)  CCA peak systolic                   86                [id number]  ECA peak systolic                   159               97  ICA peak systolic                   77                [id number]  ICA end diastolic                   18                26  PLAQUE MORPHOLOGY:                  Soft              Soft  PLAQUE AMOUNT:                      Mild              Mild  PLAQUE LOCATION:                    Proximal ECA      Proximal ICA   IMPRESSION:  1. No right internal carotid artery stenosis, status post      endarterectomy.  2. 20% to 39% left internal carotid artery stenosis.  3. No significant change on the right side since previous study      performed 05/09/2007.   ___________________________________________  V. Leia Alf, MD   MC/MEDQ  D:  08/01/2007  T:  08/02/2007  Job:  [id number]

## 2010-12-09 NOTE — Procedures (Signed)
RENAL ARTERY DUPLEX EVALUATION   INDICATION:  Followup right renal artery stent.   HISTORY:  Diabetes:  No.  Cardiac:  No.  Hypertension:  Yes.  Smoking:  Previous.   RENAL ARTERY DUPLEX FINDINGS:  Aorta-Proximal:  53 cm/s  Aorta-Mid:  79 cm/s  Aorta-Distal:  114 cm/s  Celiac Artery Origin:  106 cm/s  SMA Origin:  156 cm/s                                    RIGHT               LEFT  Renal Artery Origin:             158 cm/s            93 cm/s  Renal Artery Proximal:           136 cm/s            96 cm/s  Renal Artery Mid:                82 cm/s             73 cm/s  Renal Artery Distal:             45 cm/s             73 cm/s  Hilar Acceleration Time (AT):  Renal-Aortic Ratio (RAR):        2.0                 1.2  Kidney Size:                     9.9 cm              [id number] cm  End Diastolic Ratio (EDR):  Resistive Index (RI):            0.74                0.74   IMPRESSION:  1. Patent right renal artery stent with no significantly increased      velocities of the bilateral renal arteries noted.  2. The bilateral kidney length measurements and intrarenal resistive      indices are within normal limits.  3. No significant change noted when compared to the previous exam on      09/23/2009.  4. Incidental finding:  There is a cystic structure noted on the upper      pole of the right kidney measuring 1.7 x 1.2 cm.   ___________________________________________  V. Leia Alf, MD   CH/MEDQ  D:  04/04/2010  T:  04/04/2010  Job:  [id number]

## 2010-12-09 NOTE — Procedures (Signed)
CAROTID DUPLEX EXAM   INDICATION:  Follow up of known carotid artery disease.  Patient is  asymptomatic.   HISTORY:  Diabetes:  No.  Cardiac:  No.  Hypertension:  Yes.  Smoking:  Quit in 2001.  Previous Surgery:  Right CEA with bovine paracardial patch done on  04/21/2007 by Dr. Trula Slade IV.  CV History:  Amaurosis Fugax No, Paresthesias No, Hemiparesis No                                       RIGHT             LEFT  Brachial systolic pressure:         130               130  Brachial Doppler waveforms:         WNL  Vertebral direction of flow:        Antegrade  DUPLEX VELOCITIES (cm/sec)  CCA peak systolic                   96  ECA peak systolic                   64  ICA peak systolic                   53  ICA end diastolic                   20  PLAQUE MORPHOLOGY:                  N/A  PLAQUE AMOUNT:                      N/A  PLAQUE LOCATION:                    N/A   IMPRESSION:  No restenosis in right internal carotid artery, status post  carotid endarterectomy.   ___________________________________________  V. Leia Alf, MD   PB/MEDQ  D:  05/09/2007  T:  05/10/2007  Job:  [id number]

## 2010-12-09 NOTE — Assessment & Plan Note (Signed)
OFFICE VISIT   James [name], James [name]  DOB:  1946/02/07                                       04/11/2007  [id number]   REASON FOR VISIT:  Follow up carotid disease.   HISTORY:  This is a 65 year old gentleman whom I saw last month for  evaluation of asymptomatic right carotid stenosis.  In the interim, he  has been evaluated by cardiology and found to have no significant  ischemia demonstrated on his workup.  He comes in to further discuss  carotid endarterectomy.  After discussion regarding the risks and  benefits, he elected to proceed with carotid endarterectomy.  He has a  right-sided stenosis measuring 80-90%.  He has been scheduled for a  right carotid endarterectomy to be performed Thursday September 25.   Eldridge Abrahams, MD  Electronically Signed   VWB/MEDQ  [name]:  04/11/2007  T:  04/12/2007  Job:  [id number]

## 2010-12-09 NOTE — Procedures (Signed)
CAROTID DUPLEX EXAM   INDICATION:  Follow up carotid artery disease.   HISTORY:  Diabetes:  No.  Cardiac:  No.  Hypertension:  Yes.  Smoking:  Quit.  Previous Surgery:  Right CEA with bovine pericardial patch on 04/21/07  by Dr. Trula Slade IV.  CV History:  No.  Amaurosis Fugax No, Paresthesias No, Hemiparesis No.                                       RIGHT             LEFT  Brachial systolic pressure:         148               132  Brachial Doppler waveforms:         Triphasic         Triphasic  Vertebral direction of flow:        Antegrade         Antegrade  DUPLEX VELOCITIES (cm/sec)  CCA peak systolic                   97                97  ECA peak systolic                   102               [id number]  ICA peak systolic                   84                [id number]  ICA end diastolic                   30                50  PLAQUE MORPHOLOGY:                  N/A               Homogenous  PLAQUE AMOUNT:                      N/A               Mild  PLAQUE LOCATION:                    ECA               ICA/bifurcation   IMPRESSION:  1. Right internal carotid artery shows no evidence of restenosis,      status post carotid endarterectomy.  2. Left internal carotid artery shows evidence of 40-59% stenosis (low      end of range).  This is an increase from previous study; however,      consistent with study from one year ago.   ___________________________________________  V. Leia Alf, MD   AS/MEDQ  D:  03/19/2008  T:  03/19/2008  Job:  [id number]

## 2010-12-09 NOTE — Procedures (Signed)
CAROTID DUPLEX EXAM   INDICATION:  Follow-up carotid artery disease.   HISTORY:  Diabetes:  No.  Cardiac:  No.  Hypertension:  Yes.  Smoking:  Quit.  Previous Surgery:  Right carotid endarterectomy with bovine pericardial  patch on 04/21/2007 by Dr. Trula Slade.  CV History:  No.  Amaurosis Fugax No, Paresthesias No, Hemiparesis No.                                       RIGHT             LEFT  Brachial systolic pressure:  Brachial Doppler waveforms:  Vertebral direction of flow:        Antegrade         Antegrade  DUPLEX VELOCITIES (cm/sec)  CCA peak systolic                   122               [id number]  ECA peak systolic                   156               [id number]  ICA peak systolic                   110               0000000  ICA end diastolic                   33                50  PLAQUE MORPHOLOGY:                  None              Heterogeneous  PLAQUE AMOUNT:                      None              Mild  PLAQUE LOCATION:                    None              BIF/ICA   IMPRESSION:  1. Patent right internal carotid artery with no evidence of      restenosis.  2. A 40% to 59% stenosis of the left internal carotid artery.   ___________________________________________  V. Leia Alf, MD   AC/MEDQ  D:  09/17/2008  T:  09/17/2008  Job:  [id number]

## 2010-12-09 NOTE — Assessment & Plan Note (Signed)
OFFICE VISIT   James [name], James [name]  DOB:  07-18-1946                                       09/23/2009  [id number]   REASON FOR VISIT:  Follow-up.   Patient is a 65 year old gentleman who is status post right carotid  endarterectomy on 04/21/07 for asymptomatic disease.  He also has  undergone stenting of his right renal artery on 02/08/08 for  hypertension and elevated creatinine.  He has been unable to take Plavix  secondary to GI bleed.  He comes back in today for follow-up.  There has  been no significant interval change since I last saw him.   REVIEW OF SYSTEMS:  Negative x10, as documented in the encounter form.   SOCIAL HISTORY:  He continues to be a nonsmoker.  Does not drink  alcohol.   PAST MEDICAL HISTORY:  Hypertension, hypercholesterolemia.   PHYSICAL EXAMINATION:  Heart rate 65, blood pressure 146/71 in the right  arm and 149/71 in the left.  Temperature is 98.1.  general:  He is well-  appearing in no distress.  HEENT:  Within normal limits.  Lungs are  clear bilaterally.  No wheezes or rhonchi.  Cardiovascular:  He has a  regular rate and rhythm.  No carotid bruits.  The right carotid incision  is well-healed.  Abdomen:  Soft, nontender.  No pulsatile mass.  Musculoskeletal is without major deformity.  Neuro:  He has no focal  weakness.  Skin is without rash.  Lower extremities reveal mild pitting  edema up to the mid calf.   DIAGNOSTIC STUDIES:  I have independently reviewed his renal artery  duplex which shows no significant stenosis within the stent.  The left  renal artery is widely open.  This is not significantly changed from his  previous study.   Carotid duplex shows no evidence of restenosis, status post carotid  endarterectomy.  The left internal carotid stenosis is stable at 40-59%  stenosis.   ASSESSMENT/PLAN:  1. Renal artery stenosis:  The patient's renal duplex suggested the      stent is widely patent  without evidence of restenosis.  His      creatinine and blood pressure have been stable, according to the      patient.  We will continue with his protocol scan.  The next 1 is      in 6 months.  2. Carotid stenosis:  The patient remains asymptomatic.  The      endarterectomy side on the right is widely patent without evidence      of restenosis.  The left has stable disease in the 40-60% range.      He will be on protocol scan.  I will see him in 1 year.  We will      see him in 1 year for follow-up appointment.     Eldridge Abrahams, MD  Electronically Signed   VWB/MEDQ  [name]:  09/23/2009  T:  09/24/2009  Job:  [id number]   cc:   Chipper Herb, M.[name].  [place]

## 2010-12-09 NOTE — Procedures (Signed)
RENAL ARTERY DUPLEX EVALUATION   INDICATION:  Status post right renal artery stent.   HISTORY:  Diabetes:  No.  Cardiac:  No.  Hypertension:  Yes.  Smoking:  Quit.   RENAL ARTERY DUPLEX FINDINGS:  Aorta-Proximal:  59 cm/s  Aorta-Mid:  56 cm/s  Aorta-Distal:  68 cm/s  Celiac Artery Origin:  179 cm/s  SMA Origin:  203 cm/s                                    RIGHT               LEFT  Renal Artery Origin:             178 cm/s            210 cm/s  Renal Artery Proximal:           157 cm/s            183 cm/s  Renal Artery Mid:                145 cm/s            168 cm/s  Renal Artery Distal:             73 cm/s             107 cm/s  Hilar Acceleration Time (AT):  Renal-Aortic Ratio (RAR):        3.02                3.56  Kidney Size:                     11.1 cm             123456 cm  End Diastolic Ratio (EDR):  Resistive Index (RI):            0.63                0.70   IMPRESSION:  1. Right renal-aortic ratio suggests 40-60% stenosis.  2. Left renal-aortic ratio suggests >60% stenosis.  3. Normal kidney size noted bilaterally.   ___________________________________________  V. Leia Alf, MD   AC/MEDQ  D:  09/17/2008  T:  09/17/2008  Job:  [id number]

## 2010-12-09 NOTE — Op Note (Signed)
NAMEHAZEN, PLOWDEN           ACCOUNT NO.:  [id number]   MEDICAL RECORD NO.:  [id number]          PATIENT TYPE:  INP   LOCATION:  2040                         FACILITY:  [facility]   PHYSICIAN:  Theotis Burrow IV, MDDATE OF BIRTH:  [birth year]   DATE OF PROCEDURE:  02/08/2008  DATE OF DISCHARGE:                               OPERATIVE REPORT   PREPROCEDURE DIAGNOSIS:  Right renal stenosis.   POSTPROCEDURE DIAGNOSIS:  Right renal stenosis.   PROCEDURES PERFORMED:  1. Ultrasound access, right common femoral artery.  2. Abdominal aortogram.  3. First-order catheterization (right renal artery).  4. Right renal artery angiogram.  5. Right renal artery stent.  6. Retrograde right femoral artery closure with right femoral artery      angiogram.  7. Closure device (Star-Close).   HISTORY:  This is a 65 year old gentleman with hypertension who has  recently undergone imaging that has revealed a decreased size in his  right kidney as well as a high-grade stenosis at the origin of his right  renal artery.  His creatinine is 1.7.  He comes in today for his  arteriogram with a possibility of right renal artery intervention.   OPERATIVE PROCEDURE:  After informed consent, the patient was taken to  room #8, where he was placed supine on the table.  Bilateral groins were  prepped and draped in standard sterile fashion.  A time-out was called.  The right common femoral artery was evaluated with ultrasound and found  to be patent.  A 1% lidocaine was used for local anesthesia.  Using  ultrasound, the right common femoral artery was accessed with an 18-  gauge needle and an 0.035 wire was advanced into the abdomen under  fluoroscopic visualization.  A 6-French sheath was then placed.  Over  the wire, a pigtail catheter was placed at the level L1 and abdominal  aortogram was obtained.   FINDINGS:  Aortogram:  The visualized portion of the suprarenal  abdominal aorta showed minimal disease.   There is high-grade stenosis at  the origin of right renal artery.  The left renal artery is widely  patent.   At this point in time, I elected to proceed with intervention on the  right renal artery.  The patient was given systemic heparinization.  A  JR4 guide catheter was advanced over the wire and placed near the  orifice of the right renal artery.  Using the Montrose catheter and a  stabilizer 0.014 wire, the right renal artery was selected.  Catheter  was advanced out to the origin of the right renal artery and a right  renal angiogram was obtained.  This identified high-grade stenosis at  the ostium of the right renal artery.  The lesion was primarily stented.  A Genesis 6 x 12 stent was placed across the lesion.  The balloon was  taken to 10 atmospheres.  Followup angiogram reveals widely patent right  renal artery.  At this point in time, I was satisfied with the results.  Catheters and wires were removed.  A retrograde right femoral angiogram  was obtained for closure device and found to be  adequate and Star-Close  was successfully deployed.  The patient was taken to the recovery room  in stable condition.  There were no complications.  Again, the patient  was systemically heparinized for this procedure.   FINDINGS:  A 95% right renal artery stenosis, successfully treated with  a Genesis 6 x 12 balloon expandable stent.  Residual stenosis was less  than 10%.           ______________________________  V. Leia Alf, MD  Electronically Signed     VWB/MEDQ  D:  02/09/2008  T:  02/09/2008  Job:  [id number]

## 2010-12-09 NOTE — Procedures (Signed)
RENAL ARTERY DUPLEX EVALUATION   INDICATION:  Status post right renal artery stent.   HISTORY:  Diabetes:  No.  Cardiac:  No.  Hypertension:  Yes.  Smoking:  Quit.   RENAL ARTERY DUPLEX FINDINGS:  Aorta-Proximal:  79 cm/s  Aorta-Mid:  Aorta-Distal:  Celiac Artery Origin:  157 cm/s  SMA Origin:  203 cm/s                                    RIGHT               LEFT  Renal Artery Origin:             128/35 cm/s         91/25 cm/s  Renal Artery Proximal:           157/26 cm/s         119/32 cm/s  Renal Artery Mid:                137/30 cm/s         97/29 cm/s  Renal Artery Distal:             79/20 cm/s          72/18 cm/s  Hilar Acceleration Time (AT):    0.029 sec           0.048 sec  Renal-Aortic Ratio (RAR):        1.98                1.51  Kidney Size:                     9.1 cm X 4.69 cm    11.6 cm X 6.3 cm  End Diastolic Ratio (EDR):  Resistive Index (RI):            0.72                0.71   IMPRESSION:  1. Right kidney appears smaller, compared to left, measuring 9.1 cm      and 11.6 cm, respectively.  Right kidney also shows evidence of      cyst at upper pole, measuring 1.31 cm X 1.58 cm.  2. No evidence of right renal artery stenosis, status post stent.  3. No evidence of left renal artery stenosis.   ___________________________________________  V. Leia Alf, MD   AS/MEDQ  D:  03/19/2008  T:  03/19/2008  Job:  [id number]

## 2010-12-12 NOTE — H&P (Signed)
[place]. [facility]  Patient:    James [name], James [name]                  MRN: [id number] Adm. Date:  [id number] Attending:  Ernie Avena CC:         Jeanie Cooks, M.D.             Lillette Boxer. Sabra Heck, M.D., [facility]                         History and Physical  HISTORY OF PRESENT ILLNESS:  This 65 year old gentleman is being admitted to the hospital because of active GI bleeding.  James [name] has been known to me for the past year or so because of recurrent GI bleeding, with both occult and acute presentations.  He initially presented with iron deficiency anemia and heme-negative stool on multiple occasions but eventually he was found to be heme-positive.  His hemoglobin level did not respond well to iron supplementation.  Eventually, approximately six months ago, he had to be hospitalized because of more active GI bleeding characterized by multiple melenic stools and a low hemoglobin and he received several units of packed cells at that time.  More recently, a couple of months ago, he required further transfusion during another hospitalization because of a similar presentation.  Diagnostic evaluation so far has included multiple studies including endoscopy, small bowel enteroscopy (performed at [facility]), CT scanning, mesenteric angiography and colonoscopy.  None of these have ever demonstrated any actual bleeding nor any source of blood loss.  With this in mind, the patient noted a dark stool yesterday evening which was not frankly melenic and this morning, he then passed a frankly melenic stool, without any orthostatic symptoms or significant abdominal pain.  He used some guaiac cards which we had provided him for home use to confirm that the stool was indeed heme-positive and then presented to our office where he was hemodynamically stable and fully ambulatory.  His stool was melenic on exam and we  decided to send him over for a bleeding scan, which was positive, so inpatient management with urgent mesenteric angiography was felt to be clinically appropriate.  ALLERGIES:  No known allergies.  OUTPATIENT MEDICATIONS:  K-Dur, Maxzide 50/75 one q.a.m. and ferrous sulfate three tablets daily.  OPERATIONS:  Laparoscopic cholecystectomy by Dr. Orson Ape. [place], 1992.  CHRONIC MEDICAL ILLNESSES:  None.  HABITS:  The patient stopped smoking about six months ago.  SOCIAL HISTORY:  The patient is married and lives with his wife.  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  REVIEW OF SYSTEMS:  The patient felt slightly queasy today but has not had frank abdominal pain nor any vomiting.  PHYSICAL EXAMINATION:  VITAL SIGNS:  Blood pressure 140/92 with pulse of 100, supine, and 136/100 with a pulse of 110 after standing for a couple of minutes (these vital signs were obtained in my office earlier this afternoon).  HEENT:  The patient is anicteric and has just a little bit of pallor of his palate.  CHEST:  Clear to auscultation.  HEART:  No gallops, rubs, murmurs, clicks or arrhythmias.  ABDOMEN:  Without organomegaly, guarding, mass or tenderness.  RECTAL:  Melenic stool, 4+ heme-positive.  MENTAL STATUS:  The patient is alert, oriented, coherent and appropriate, without obvious cranial nerves or focal motor deficits.  The patient is fully ambulatory.  LABORATORY DATA:  Labs obtained in my  office this afternoon showed a hemoglobin of 11.8 (as compared with 13.0 in the office of Dr. Ralene Ok five days ago), BUN 35, creatinine approximately 1.8, potassium 3.1.  BLEEDING SCAN:  The bleeding scan was positive for active extravasation of blood from the small bowel.  IMPRESSION:  Active but non-destabilizing small bowel bleed of indeterminate etiology.  PLAN: 1. Urgent mesenteric angiography. 2. Repeat blood work with aggressive IV fluids. 3. Further management  to depend on the mesenteric angiographic findings. DD:  12/17/99 TD:  12/18/99 Job: [id number]

## 2010-12-12 NOTE — Op Note (Signed)
[place]. [facility]  Patient:    James [name], James [name]                  MRN: [id number] Proc. Date: 01/12/00 Adm. Date:  [id number] Attending:  Coralie Keens A                           Operative Report  PREOPERATIVE DIAGNOSIS:  Upper gastrointestinal hemorrhage.  POSTOPERATIVE DIAGNOSIS:  Upper gastrointestinal hemorrhage.  PROCEDURES: 1. Exploratory laparotomy. 2. Small bowel resection.  SURGEON:  Coralie Keens, M.D.  ASSISTANT:  Orson Ape. Rise Patience, M.D.  ESTIMATED BLOOD LOSS:  Minimal.  ANESTHESIA:  General endotracheal anesthesia.  INDICATIONS:  The patient is a 65 year old gentleman who has had a GI bleed that has been investigated for approximately a year with multiple endoscopies, arteriograms, and bleeding scans.  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  Prior to surgery today, the patient went to the arteriotomy suite, where the AVM was again demonstrated and coils were placed in the distal supplying vessel.  PROCEDURE IN DETAIL:  The patient was brought to the operating room, identified as [name].  He was placed supine on the operating table and then general anesthesia was induced.  His abdomen was then prepped and draped in the usual sterile fashion.  Using a #10 blade, an upper midline incision was then created.  The incision was carried down through the abdominal fascia with electrocautery.  The peritoneum was then opened the entire length of the incision.  The small bowel was then eviscerated.  The small bowel was then run from the ligament of Treitz to the terminal ileum. The coils placed in interventional radiology can be palpated in the midportion of the proximal mesenteric.  An area of questionable ischemia could be identified also in the proximal small bowel.  At this point, the  small bowel was transected approximately 10 cm distal to this with the GIA-55 stapler.  The mesentry was then taken down with clamps and 2-0 silk ties, as well as, silk ligatures.  The proximal bowel was then clamped, grasped with an Allen clamp, and transected with the scalpel.  The small bowel specimen was then removed from the field.  Fluoroscopy identified the coils in the mesentry of the specimen.  At this point, an end-to-end small bowel anastomosis was performed with interrupted 3-0 silk pop-off sutures and adequate anastomosis appeared to be achieved.  The mesenteric defect was closed with interrupted silks as well.  The abdomen was then copiously irrigated with normal saline.  Hemostasis appeared to be achieved.  The fascia was then closed with a running #1 PDS suture.  The skin was then irrigated and closed with skin staples.  The patient tolerated the procedure well.  All sponge, needle, and instrument counts were correct at the end of the procedure.  The patient was then extubated in the operating room and taken in stable condition to the recovery room. DD:  01/12/00 TD:  01/15/00 Job: [id number]

## 2010-12-12 NOTE — Op Note (Signed)
James [name], James [name]           ACCOUNT NO.:  [id number]   MEDICAL RECORD NO.:  [id number]          PATIENT TYPE:  AMB   LOCATION:  ENDO                         FACILITY:  [facility]   PHYSICIAN:  Ronald Lobo, M.D.   DATE OF BIRTH:  1945/12/01   DATE OF PROCEDURE:  04/02/2006  DATE OF DISCHARGE:                                 OPERATIVE REPORT   PROCEDURE:  Upper endoscopy.   INDICATIONS:  A 65 year old gentleman with transiently Hemoccult positive  stool, without anemia.  He has been on p.r.n. indomethacin as an outpatient  and does have a history of some hemorrhoids which might account for the heme-  positivity.  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  Since that surgery, the patient has  not had any recurrent bleeding problems.   FINDINGS:  Minimal gastric antral erythema.   PROCEDURE:  The nature, purpose and risks of the procedure were familiar to  the patient who provided written consent.  Sedation was fentanyl 50 mcg and  Versed 5 mg IV without arrhythmias or desaturation.  The Olympus small-  caliber adult video endoscope was passed under direct vision.  The larynx  looked normal.  The esophagus was readily entered.   The esophagus was normal, without evidence of reflux esophagitis, Mallory  Weiss tear or any ring stricture, varices, Barrett's esophagus,  inflammation, infection or neoplasia.  No hiatal hernia was appreciated.  The stomach contained a minimal bilious residual and the antrum of the  stomach had some mild erythema without erosive changes or evidence of  vascular ectasia.  No ulcers, polyps or masses were seen including a  retroflexed view of the cardia.   The pylorus, duodenal bulb and second and third portions of the duodenum  looked normal.  I advanced the scope as far as I could down the duodenum.  It is not clear  exactly whether or not I reached the jejunum or whether I  encountered the anastomosis but no anastomotic changes were endoscopically  evident up to the limit of my exam.   The scope was then removed from the patient.  No biopsies were obtained.  He  tolerated the procedure well and there were no apparent complications.   IMPRESSION:  Essentially normal endoscopy, without source of heme-positive  stool identified.  However, it is quite possible that he has had transient  erosive gastric inflammation from his intermittent use of indomethacin,  which might well have accounted for his previous heme positivity.   PLAN:  Proceed to colonoscopic evaluation.           ______________________________  Ronald Lobo, M.D.     RB/MEDQ  D:  04/02/2006  T:  04/02/2006  Job:  [id number]   cc:   Chipper Herb, M.D.

## 2010-12-12 NOTE — Discharge Summary (Signed)
[place]. [facility]  Patient:    James [name], James [name]                  MRN: [id number] Adm. Date:  [id number] Disc. Date: [id number] Attending:  Ernie Avena Dictator:   Evalee Jefferson, P.A. CC:         Jeryl Columbia, M.D.             Jeanie Cooks, M.D.             Posey Rea, M.D., [facility]                           Discharge Summary  DATE OF BIRTH:  March 07, 1946  REASON FOR ADMISSION:  Melenic stools.  FINAL DIAGNOSES: 1. Cryptogenic gastrointestinal bleed, presumably of small-bowel origin. 2. Posthemorrhagic anemia. 3. Hypokalemia.  CONSULTATIONS:  None.  COMPLICATIONS:  None.  PROCEDURES:  The patient received one unit of packed red blood cells on the date of admission due to an admitting hemoglobin level of 7.6.  LABORATORY DATA:  CBC obtained on his date of admission of November 09, 1999, revealed a white blood cell count of 10.4, with a hemoglobin level of 7.6, hematocrit of 20.8, and a platelet count of 240.  His mean cell volume was 76.1, and he had an RDW of 17.2.  A PT was obtained and was 15.4 (slightly elevated), and his INR was 1.4.  His CMET on November 09, 1999, revealed a sodium of 135, potassium of 2.6, chloride of 93, CO2 of 31, BUN of 16, creatinine of 1.0, and a glucose of 122.  His total protein was 6.1, with an albumin of 3.4. His liver functions were within normal range with an AST of 30, an ALT of 19, alkaline phosphatase of 69, and a total bilirubin of 0.3.  Urinalysis obtained on the date of admission was negative.  During the course of his admission, the patients hemoglobin level responded by 1 g status post one unit of packed red blood cells and, by the date of discharge, his hemoglobin level was at 10.1.  His other parameters were essentially unchanged from date of admission, with the exception of his potassium which, after supplementation, increased from an original 2.6 at  admission to 2.8, and it was at 3.2 on the date of discharge.  HOSPITAL COURSE:  James [name] is well known to [place], after having undergone a hospitalization approximately six months ago for a lower GI bleed.  He presented on November 09, 1999, with an approximate 24-hour history of melenic stools and feeling weak and tired.  On the date of his admission, he was found to be guaiac-positive and had a hemoglobin level of 7.6.  He was promptly admitted to the hospital for supportive care and received one unit of packed red blood cells on his date of admission.  He also underwent a nuclear medicine bleed scan on November 09, 1999, which was negative for being able to localize any source of bleeding.  After his transfusion, his hemoglobin responded nicely to a level of 8.8.  His potassium level on the date of admission was low, and this was supplemented in his IV fluids; however, he did not respond appropriately to this IV regimen and was, therefore, given p.o. K-Dur tablets 20 mEq x 3.  Also, on November 10, 1999, the patient underwent an abdominal and pelvic CT to further define abdominal anatomy  and to rule out tumors and other possible sources of bleed.  The CT scan was negative for an intra-abdominal sources of bleeding.  By the morning of November 11, 1999, the patient had been increased to a regular diet and was tolerating this well.  As mentioned above, his hemoglobin by this morning was stable at 10.1, and his potassium was still slightly low but improved at 3.2.  He was discharged to home on November 11, 1999, with some p.o. potassium supplementation, and he will be further worked up for this cryptogenic GI bleed on an outpatient basis.  DISPOSITION:  The patient was discharged to home.  FOLLOW-UP:  He does have a follow-up appointment with Dr. Cristina Gong on Thursday, November 20, 1999, at 10 a.m.  MEDICATIONS:  He was advised to continue his home medications including his hydrochlorothiazide and his  iron supplementation, and he was also given a new prescription for K-Dur 20 mEq, #14, with instructions to take 1 tablet p.o. q.d.  ACTIVITY:  Without restrictions, as tolerated.  DIET:  No restrictions.  SPECIAL INSTRUCTIONS:  Avoid aspirin or aspirin products until his next office visit with Dr. Cristina Gong.  CONDITION ON DISCHARGE:  Improved. DD:  11/11/99 TD:  11/11/99 Job: [id number]

## 2010-12-12 NOTE — Op Note (Signed)
[name], James           ACCOUNT NO.:  [id number]   MEDICAL RECORD NO.:  [id number]          PATIENT TYPE:  AMB   LOCATION:  ENDO                         FACILITY:  [facility]   PHYSICIAN:  Ronald Lobo, M.D.   DATE OF BIRTH:  Dec 12, 1945   DATE OF PROCEDURE:  04/02/2006  DATE OF DISCHARGE:                                 OPERATIVE REPORT   PROCEDURE:  Colonoscopy with biopsy.   INDICATIONS:  Transiently Hemoccult positive stool in a patient with a  negative colonoscopy about 7 years ago.   FINDINGS:  Diminutive rectal polyp removed by cold biopsy.   PROCEDURE:  The nature, purpose and risks of the procedure were familiar to  the patient from prior examinations and he provided written consent.  Sedation was fentanyl 62.5 mcg and Versed 6 mg IV without arrhythmias or  desaturation.  The Olympus adjustable tension pediatric video colonoscope  was advanced to the terminal ileum without significant difficulty and  pullback was then performed.  The terminal ileum looked normal.   The quality of prep was very good and it is felt that all areas were  adequately seen.   The only abnormality on this exam was an erythematous, smooth 5 mm sessile  polyp on a fold in the proximal rectum at about 15 cm from the external anal  opening.  It was removed by two cold biopsies.   No other polyps were seen and there was no evidence of cancer, colitis,  vascular malformations or diverticular disease.  Retroflexion in the rectum  and reinspection of the rectum were unremarkable.  The patient tolerated the  procedure well and there were no apparent complications.   IMPRESSION:  1. Diminutive rectal polyp removed as described above.  2. No source of transiently heme-positive stool identified.  It is thought      possibly to be due to intermittent hemorrhoidal excoriation or      intermittent gastric inflammation from use of indomethacin.   PLAN:  Await pathology on the rectal polyp.         ______________________________  Ronald Lobo, M.D.     RB/MEDQ  D:  04/02/2006  T:  04/02/2006  Job:  [id number]   cc:   Chipper Herb, M.D.

## 2010-12-12 NOTE — Procedures (Signed)
[place]. [facility]  Patient:    James [name]                   MRN: [id number] Proc. Date: 06/25/99 Adm. Date:  [id number] Attending:  Ernie Avena CC:         Lillette Boxer. Sabra Heck, M.D.             Jeanie Cooks, M.D.                           Procedure Report  PROCEDURE PERFORMED:  Colonoscopy.  ENDOSCOPIST:  Cleotis Nipper, M.D.  INDICATIONS:  A 65 year old gentleman with longstanding iron deficiency anemia,  who, for the most part, has had heme negative stool, although on one occasion a  couple of months ago had some dark stools in associated with a drop in hemoglobin. He has had to have intravenous iron to restore his iron levels and hemoglobin level which risen to approximately 14 earlier this month.  Then, he began feeling a little bit weak in associated with some darker than usual stools approximately our days ago and presented to his primary physicians office where his hemoglobin had dropped from 14 to 8 over a three week period.  His stool was heme positive. He was sent home, bleeding scan was arranged, and he returned the next day to the office where the hemoglobin had dropped another two grams to approximately 6. e was therefore admitted to the hospital last night and the bleeding scan, which as performed this morning, came back negative.  The patient has just had a repeat small bowel endoscopy, which was unrevealing.  This procedure is being done to elp exclude a lower tract source of GI blood loss.  Of note, a colonoscopy performed several months ago showed a question of some minimal cecal vascular ectasia.  FINDINGS:  Melenic-appearing stool present in the proximal colon with lighter colored stool present more distally.  No source of bleeding identified.  No evidence of active bleeding at this time.  DESCRIPTION OF PROCEDURE:  The nature, purpose and risks of the procedure were familiar to the patient who  provided written consent.  This procedure was performed immediately following his endoscopic evaluations (proximal small bowel endoscopy) and used the same Olympus pediatric video colonoscope.  Total sedation for the wo procedures was fentanyl 75 mcg and Versed 7 mg without arrhythmias or desaturation.  Digital exam of the prostate was unremarkable. The PCF140L colonoscope was advanced without much difficulty through the sigmoid region (this had previously been examined on his prior colonoscopy with an adult colonoscope which had great difficulty negotiating that area, but there was no trouble using the pediatric colonoscope).  The scope was then advanced fairly easily around the colon to the area just above the cecum.  Unfortunately, we then had to spend about 15 minutes to get the tip of the scope to advance the next three or four inches into the base of the cecum, nd ultimately, this was accomplished with the patient in the prone position. Pull-back was then performed.  There was no evidence of any cecal vascular ectasia as had been noted on his colonoscopy several months ago.  There was no red blood or fresh blood anywhere in the colon but in the proximal colon, there was a film of black pasty material consistent with melena, as opposed to the more distal portions of the colon where the stool was bulkier  and somewhat lighter in color, basically a dark brown/green. During pullback, I encountered no evidence of any fresh blood or red blood nor ny prospective bleeding sites.  Specifically, I did not see any vascular malformations, polyps, cancer, colitis or diverticular disease.  Retroflexion was not performed in the rectum.  This procedure was done unprepped, and as such, isolated or small lesions may have been missed.  The patient tolerated the procedure well and there were no apparent complications. No biopsies were obtained.  IMPRESSION:  Evidence for melenic  stool in the proximal colon but otherwise grossly normal unprepped exam.  No bleeding site identified.  PLAN:  Mesenteric angiography tomorrow. DD:  06/25/99 TD:  06/26/99 Job: [id number]

## 2010-12-12 NOTE — Procedures (Signed)
[place]. [facility]  Patient:    James [name], James [name]                  MRN: [id number] Proc. Date: 12/18/99 Adm. Date:  [id number] Disc. Date: [id number] Attending:  Ernie Avena CC:         Lillette Boxer. Sabra Heck, M.D., [facility]             Coralie Keens, M.D.             Jeanie Cooks, M.D.                           Procedure Report  PROCEDURE:  Small bowel endoscopy.  ENDOSCOPIST:  Cleotis Nipper, M.D.  ANESTHESIA:  INDICATIONS:  A 65 year old gentleman with recurrent cryptogenic GI bleeding, who finally, was diagnosed as having a proximal small bowel AVM, actively bleeding during mesenteric angiography performed urgently following the onset of dark stool and apparent recurrent bleeding.  The bleeding lesion appeared to be just distal to the ligament of Treitz.  The intent of this procedure is to try to localize the offending lesion so that it can be marked in preparation for surgical resection.  FINDINGS:  No bleeding at time of exam and bleeding site was not able to be identified.  DESCRIPTION OF PROCEDURE:  The nature, purpose, and risks of the procedure were familiar to the patient who provided written consent.  Sedation was fentanyl 75 mcg and Versed 6 mg IV plus Glucagon 0.5 mg IV, given to reduce duodenal contractility.  There was no problem with arrhythmias or desaturation during the course of the procedure.  The Olympus PCF-160L adjustable tension pediatric video colonoscope was advanced into the esophagus quite easily.  The vocal cords are not well seen. 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  No esophagitis, Barretts esophagus, varices, infection, neoplasia, or Mallory-Weiss tear were identified.  The stomach was entered.  There were a couple of mucosal hemorrhages, but no blood or  coffee-ground material within the gastric lumen.  The gastric mucosa was normal in all locations without evidence of gastritis, erosions, ulcers, polyps, masses, or any vascular malformations within the stomach including retroflexed view of the proximal stomach.  The pylorus, duodenal bulb, and proximal duodenum looked normal.  The scope was able to be advanced quite far down the small bowel and I estimate that we reached the region of the proximal jejunum, almost certainly as far as the lesion identified on last nights angiography.  However, there was absolutely no blood or coffee-ground material within the lumen of the small bowel, nor did I see any perspective bleeding site.  I reexamined this area twice endoscopically and still could not identify any lesions.  At this point, the scope was withdrawn from the patient.   No biopsies were obtained.  He tolerated the procedure well and there were no apparent complications.  IMPRESSION:  Small hiatal hernia with esophageal ring, otherwise normal proximal small bowel endoscopy without visualization of the bleeding site noted angiographically in the proximal jejunum last night.  PLAN:  If the patient is stable overnight, he will probably be discharged tomorrow morning with the anticipation of elective surgery for resumption of the bleeding lesion (_______ malformation) next week, with anticipation also of preoperative angiography to inject coils and/or dye into the specific vessel  that was feeding the bleeding lesion last night to facilitate intraoperative targeted resection. DD:  12/18/99 TD:  12/23/99 Job: [id number]

## 2010-12-12 NOTE — Discharge Summary (Signed)
[place]. [facility]  Patient:    James [name], James [name]                  MRN: [id number] Adm. Date:  [id number] Disc. Date: [id number] Attending:  Coralie Keens A Dictator:   Evalee Jefferson, P.A. CC:         Jeanie Cooks, M.D.             Alain Honey, M.D., [facility]             Coralie Keens, M.D.                           Discharge Summary  CHIEF COMPLAINT: Gastrointestinal bleed.  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  2. Post-hemorrhagic anemia.  3. Hypertension.  CONSULTATION: Coralie Keens, M.D. (surgery).  COMPLICATIONS: None.  PROCEDURES:  1. Upper endoscopy with small bowel endoscopy performed, looking for a more     proximal source for the patients bleed.  There was no bleeding at the     time of the examination and a bleeding site was not able to be identified.  2. Mesenteric arteriogram, which was revealing for angiodysplasia within the     proximal jejunum and also suspicion of a second larger angiodysplasia in     the cecum arising from the ileocolic artery.  3. Prior to arteriogram the patient underwent a gastrointestinal bleeding     scan, which was revealing of positive activity in the upper abdomen and in     the right lower quadrant.  LABORATORY DATA: A CBC was obtained on the date of admission, revealing a WBC of 10.2, hemoglobin level 11.2, hematocrit 30.5, MCV 79.9, RDW 13.9; platelet count 346,000.  During the course of his three day admission these values were relatively stable with the exception of his hemoglobin and hematocrit, which drifted down to a low of hemoglobin 7.0 and corresponding hematocrit of 19.4. A coag was obtained on Dec 18, 1999, with PT of 15.5 seconds and INR of 1.4, PTT 35 seconds.  A CMET the date of admission revealed a sodium of 137, potassium 2.9,  chloride 97, CO2 28, glucose 124, BUN 34, creatinine 1.1. Calcium was 9.5.  These numbers were relatively unchanged during the course of his hospitalization.  His discharge potassium was 3.2.  HOSPITAL COURSE: [name] was admitted on Dec 17, 1999 due to a long-standing history of occult GI bleeding with current acute active bleed in progress.  The date of his admission he was sent for a bleed scan, which was positive in the small bowel, and he was therefore admitted for emergent mesenteric angiogram and possible surgical intervention.  Throughout the course of his hospitalization Mr. Presser was comfortable, yet tired.  His hemoglobin dropped to a low of 7.0 during the course of his admission, but he did not become hemodynamically unstable, with no evidence of orthostatic hypotension, shortness of breath, or chest pain.  He was maintained on NPO status after his small bowel AVMs were diagnosed via mesenteric angiogram. Due to the course of his hospitalization the patients stools remained dark but they were not melanotic, and although they were Hemoccult positive it did not appear the patient had a continued GI bleed, rather this being a bleed that occurred and then stopped on its own prior  to this admission.  After surgical consultation there was concern over the difficulty in localizing the site of the AVMs intraoperatively.  Because the patient was clinically stable, and there was no further evidence of GI bleeding, the patient was discharged home on Dec 20, 1999.  After discussing options with the patient the decision was made to try to further localize the source of the AVMs within the small bowel using coils and dye on an outpatient basis, and then be readmitted to the hospital once better localization was obtained for small bowel resection.  DISCHARGE MEDICATIONS: He was asked to resume all previous home medications, including:  1. Iron sulfate t.i.d.  2. Maxzide 70/50  mg q.d.  3. K-Dur 20 mEq b.i.d.  DISCHARGE ACTIVITY: He was asked to resume activity level as tolerated, resuming more normal activity as strength returns.  DISCHARGE DIET: There were no dietary restrictions.  SPECIAL INSTRUCTIONS: He was given special instructions to contact our office day or not if recurrent bleeding occurs.  Additionally, he was asked to calcium Dr. Pablo Lawrence office for a follow-up office visit in approximately two months, and also to contact Dr. Adela Ports office for an appointment the week after his discharge.  DISCHARGE CONDITION: Improved. DD:  01/23/00 TD:  01/23/00 Job: [id number]

## 2010-12-12 NOTE — Procedures (Signed)
[place]. [facility]  Patient:    James [name]                   MRN: [id number] Proc. Date: 06/25/99 Adm. Date:  [id number] Attending:  Ernie Avena CC:         Posey Rea, M.D.; [facility]                           Procedure Report  PROCEDURE:  Small bowel endoscopy.  INDICATIONS:  A 65 year old with heme-positive stool, abrupt drop in hemoglobin, and subacute clinical GI bleeding with long-standing history of iron-deficiency  anemia attributed to obscure GI blood loss.  FINDINGS:  Small hiatal hernia with esophageal ring.  Small amount of retained food.  INFORMED CONSENT:  The nature, purpose, and risks of the procedure were familiar to the patient from prior examinations.  He provided written consent.  SEDATION:  Fentanyl 75 mcg and Versed 7 mg IV for this procedure and the colonoscopy which followed it.  No problem with arrhythmias or desaturation.  DESCRIPTION OF PROCEDURE:  The Olympus PCF140L pediatric video extended length colonoscope was used as a small bowel enteroscope and passed under direct vision. The esophagus was entered without undue difficulty and had normal mucosa without evidence of reflux, esophagitis, Barretts esophagus, varices, infection, neoplasia, or any Mallory-Weiss tear.  There was a nice esophageal ring at the squamocolumnar junction, and below this was a 2 cm hiatal hernia.  The stomach was entered and noted to contain a small amount of residual food, most of which was liquid in character and could be suctioned out, although a small amount of debris remained behind.  Along the proximal and greater curve, there as noted to be a small, red, blotchy area thought to be a suction artifact since it corresponded to where the tip of the scope, and the suction portion of the scope, had been located.  The remainder of the stomach was normal, without evidence of  gastritis,  erosions, ulcers, polyps, or masses.  A retroflexed view was unremarkable, showing the cardia of the stomach.  The pylorus, duodenal bulb, second duodenum, third duodenum, and what I believed was probably the fourth portion of the duodenum and/or proximal jejunum, were unremarkable in appearance. Although, a previous small bowel series had raised the question of small bowel nodularity, that was not appreciated on the current exam, even as it had not been noted on a recent small bowel enteroscopy performed at [facility].  The scope was removed from the patient.  No biopsies were obtained.  He tolerated the procedure well, and there were no apparent complications.  IMPRESSION:  Small hiatal hernia, Schatzkis ring, otherwise normal exam without  source of heme-positive stool or severe anemia evident on this exam.  PLAN:  Proceed to colonoscopic evaluation. DD:  06/25/99 TD:  06/26/99 Job: [id number]

## 2010-12-12 NOTE — H&P (Signed)
[place]. [facility]  Patient:    James [name], James [name]                    MRN: [id number] Adm. Date:  11/09/99 Attending:  Jeryl Columbia, M.D. CC:         Jeryl Columbia, M.D.             Cleotis Nipper, M.D.             Jeanie Cooks, M.D.             Posey Rea, M.D., [place]                         History and Physical  HISTORY:  Patient well-known to my partner, Dr. Youlanda Mighty Buccini, with obscure GI bleeding with multiple negative colons and ______  angiograms, CT scan, enteroscopy at [facility], nuclear bleeding scans, who called me last night saying he was feeling weaker, has had black stools for two days and has seen Dr. Haynes Bast. [place]inson in the past for IV iron and requested ER evaluation, which I thought was appropriate.  Dr. Mylinda Latina III of the emergency room kindly saw him for me, found him to be guaiac-positive, a hemoglobin of 7 and he is admitted for further workup, plans and observation.  Mr. Sutherby had been as low at in the past and does say after five days of IV iron, however, his hemoglobin had gotten up to 14.  Currently, he did have a bout of vomiting yesterday after dinner without any blood and has had some vague stomach discomforts and possibly his stools have been a little darker than usual and a little more frequent with just a rare amount of diarrhea.  He, himself, has not seen any blood and they are not ery often black.  PAST MEDICAL HISTORY:  Pertinent for a cholecystectomy, ulcers in the 1970s and  high blood pressure.  ALLERGIES:  No known allergies.  CURRENT MEDICINES:  Iron, potassium and Maxzide.  FAMILY HISTORY:  Negative for any GI problems, bleeding problems, etc.  SOCIAL HISTORY:  He has quit smoking and drinking and does not take any over-the-counter medicines, including aspirin, ibuprofen, Motrin, Goodys, etc.  REVIEW OF SYSTEMS:  Negative except  above.  PHYSICAL EXAMINATION:  GENERAL:  In no acute distress, lying comfortably in bed.  VITAL SIGNS:  Blood pressure 133/79, pulse of 100, respirations 20, temperature  99.3.  HEENT:  Sclerae nonicteric.  NECK:  Supple without adenopathy.  LUNGS:  Clear.  HEART:  Regular rate and rhythm.  ABDOMEN:  Soft and nontender.  RECTAL:  Exam by Dr. Monia Pouch, guaiac positive.  EXTREMITIES:  No pedal edema.  Good peripheral pulses.  LABORATORY DATA:  Labs pertinent for the hemoglobin as mentioned above, 7.6, with an MCV of 76, WBC 10.4, platelets 240,000.  Urinalysis is negative. Chemistries are normal, particularly with a BUN of 16, potassium of 2.6. Liver tests normal.  ASSESSMENT: 1. Chronic gastrointestinal blood loss with negative workup so far, rather    extensive. 2. Chronic anemia, now with a hemoglobin of 7.6. 3. High blood pressure. 4. History of cholecystectomy.  PLAN:  Will admit for observation, clear liquids only, go ahead and transfuse, et a STAT nuclear bleeding scan.  If he is stable, there is no obvious acute bleeding and the scan is negative, I will leave it to Dr. Cristina Gong tomorrow to decide  how  much further workup and plans are needed; however, might proceed with an endoscopy today if signs of continued bleeding or pending bleeding scan results, as above. DD:  11/09/99 TD:  11/09/99 Job: [id number]

## 2010-12-12 NOTE — Discharge Summary (Signed)
[place]. [facility]  Patient:    James [name], James [name]                  MRN: [id number] Adm. Date:  [id number] Disc. Date: [id number] Attending:  Coralie Keens A CC:         Coralie Keens, M.D.                           Discharge Summary  HISTORY OF PRESENT ILLNESS:  James [name] is a pleasant 65 year old gentleman who has had a year long GI bleed which has undergone intensive evaluation in order to determine its source.  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  The patient was admitted for selective visceroarteriography with coil placement and then elective laparotomy.  HOSPITAL COURSE:  The patient was admitted on January 12, 2000, and taken to arteriography where arteriogram was performed.  The patient was found to have the AV malformation.  Coils were placed at this time and the patient was then taken to the operating room where he underwent exploratory laparotomy with a small bowel resection.  Postoperatively, the patient was taken to the surgical floor.  His postoperative hemoglobin was 13.7.  Postoperatively, he did well and was on NG drainage the first several postoperative days until this resolved.  By postop day #4, he tolerated the NG tube being removed.  His hemoglobin at this point was 9.1.  Pathology did show superficial mucosa erosions and mucosal hyperemia with no evidence of malignancy.  He was started on clear liquids which were slowly advanced.  By January 18, 2000, the patient was ambulating well and had good bowel movements.  Hemoglobin was 9.4 and the decision was made to discharge the patient home.  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  DISCHARGE INSTRUCTIONS: 1. Activity as tolerated. 2. No heavy lifting.  DISCHARGE MEDICATIONS: 1. Vicodin for pain. 2. Resume home medications.  FOLLOW-UP  ARRANGEMENTS:  Follow up in Dr. Adela Ports office one week postdischarge. DD:  02/03/00 TD:  02/04/00 Job: [id number]

## 2011-04-24 LAB — CBC
HCT: 38.7 — ABNORMAL LOW
Hemoglobin: 13.1
MCHC: 33.8
MCV: 82.7
Platelets: 228
RBC: 4.69
RDW: 13.5
WBC: 6.2

## 2011-04-24 LAB — BASIC METABOLIC PANEL
BUN: 13
CO2: 27
Calcium: 8.8
Chloride: 104
Creatinine, Ser: 1.22
GFR calc Af Amer: >60
GFR calc non Af Amer: >60
Glucose, Bld: 125 — ABNORMAL HIGH
Potassium: 3.9
Sodium: 138

## 2011-05-07 LAB — CBC
HCT: 35.6 — ABNORMAL LOW
HCT: 42.9
Hemoglobin: 12.2 — ABNORMAL LOW
Hemoglobin: 14.5
MCHC: 33.8
MCHC: 34.2
MCV: 82.8
MCV: 82.8
Platelets: 210
Platelets: 247
RBC: 4.3
RBC: 5.18
RDW: 13.2
RDW: 13.6
WBC: 8.4
WBC: 9.6

## 2011-05-07 LAB — COMPREHENSIVE METABOLIC PANEL
ALT: 18
AST: 19
Albumin: 4
Alkaline Phosphatase: 91
BUN: 26 — ABNORMAL HIGH
CO2: 28
Calcium: 9.6
Chloride: 105
Creatinine, Ser: 1.58 — ABNORMAL HIGH
GFR calc Af Amer: 54 — ABNORMAL LOW
GFR calc non Af Amer: 45 — ABNORMAL LOW
Glucose, Bld: 105 — ABNORMAL HIGH
Potassium: 4.1
Sodium: 138
Total Bilirubin: 0.6
Total Protein: 7.3

## 2011-05-07 LAB — URINALYSIS, ROUTINE W REFLEX MICROSCOPIC
Bilirubin Urine: NEGATIVE
Glucose, UA: NEGATIVE
Hgb urine dipstick: NEGATIVE
Ketones, ur: NEGATIVE
Nitrite: NEGATIVE
Protein, ur: NEGATIVE
Specific Gravity, Urine: 1.021
Urobilinogen, UA: 0.2
pH: 5

## 2011-05-07 LAB — BASIC METABOLIC PANEL
BUN: 22
CO2: 25
Calcium: 8.4
Chloride: 107
Creatinine, Ser: 1.46
GFR calc Af Amer: 59 — ABNORMAL LOW
GFR calc non Af Amer: 49 — ABNORMAL LOW
Glucose, Bld: 131 — ABNORMAL HIGH
Potassium: 4.5
Sodium: 134 — ABNORMAL LOW

## 2011-05-07 LAB — TYPE AND SCREEN
ABO/RH(D): O NEG
Antibody Screen: NEGATIVE

## 2011-05-07 LAB — APTT: aPTT: 39 — ABNORMAL HIGH

## 2011-05-07 LAB — PROTIME-INR
INR: 1
Prothrombin Time: 13.2

## 2011-05-07 LAB — ABO/RH: ABO/RH(D): O NEG

## 2011-05-25 ENCOUNTER — Other Ambulatory Visit (HOSPITAL_COMMUNITY): Payer: Self-pay | Admitting: Oncology

## 2011-05-25 ENCOUNTER — Encounter (HOSPITAL_BASED_OUTPATIENT_CLINIC_OR_DEPARTMENT_OTHER): Payer: Medicare Other | Admitting: Oncology

## 2011-05-25 DIAGNOSIS — D509 Iron deficiency anemia, unspecified: Secondary | ICD-10-CM

## 2011-05-25 LAB — IRON AND TIBC
%SAT: 22 % (ref 20–55)
Iron: 63 ug/dL (ref 42–165)
TIBC: 292 ug/dL (ref 215–435)
UIBC: 229 ug/dL (ref 125–400)

## 2011-05-25 LAB — CBC WITH DIFFERENTIAL/PLATELET
BASO%: 0.7 % (ref 0.0–2.0)
Basophils Absolute: 0 10*3/uL (ref 0.0–0.1)
EOS%: 2.1 % (ref 0.0–7.0)
Eosinophils Absolute: 0.1 10*3/uL (ref 0.0–0.5)
HCT: 43.1 % (ref 38.4–49.9)
HGB: 14.5 g/dL (ref 13.0–17.1)
LYMPH%: 21.3 % (ref 14.0–49.0)
MCH: 28.2 pg (ref 27.2–33.4)
MCHC: 33.5 g/dL (ref 32.0–36.0)
MCV: 84.1 fL (ref 79.3–98.0)
MONO#: 0.4 10*3/uL (ref 0.1–0.9)
MONO%: 7.4 % (ref 0.0–14.0)
NEUT#: 3.8 10*3/uL (ref 1.5–6.5)
NEUT%: 68.5 % (ref 39.0–75.0)
Platelets: 194 10*3/uL (ref 140–400)
RBC: 5.13 10*6/uL (ref 4.20–5.82)
RDW: 13.9 % (ref 11.0–14.6)
WBC: 5.5 10*3/uL (ref 4.0–10.3)
lymph#: 1.2 10*3/uL (ref 0.9–3.3)

## 2011-05-25 LAB — LACTATE DEHYDROGENASE: LDH: 139 U/L (ref 94–250)

## 2011-05-25 LAB — COMPREHENSIVE METABOLIC PANEL
ALT: 17 U/L (ref 0–53)
AST: 16 U/L (ref 0–37)
Albumin: 4.2 g/dL (ref 3.5–5.2)
Alkaline Phosphatase: 143 U/L — ABNORMAL HIGH (ref 39–117)
BUN: 13 mg/dL (ref 6–23)
CO2: 26 mEq/L (ref 19–32)
Calcium: 9.4 mg/dL (ref 8.4–10.5)
Chloride: 104 mEq/L (ref 96–112)
Creatinine, Ser: 1.24 mg/dL (ref 0.50–1.35)
Glucose, Bld: 105 mg/dL — ABNORMAL HIGH (ref 70–99)
Potassium: 3.8 mEq/L (ref 3.5–5.3)
Sodium: 140 mEq/L (ref 135–145)
Total Bilirubin: 1 mg/dL (ref 0.3–1.2)
Total Protein: 6.8 g/dL (ref 6.0–8.3)

## 2011-05-25 LAB — FERRITIN: Ferritin: 203 ng/mL (ref 22–322)

## 2011-10-05 ENCOUNTER — Encounter: Payer: Self-pay | Admitting: Surgery

## 2011-10-09 ENCOUNTER — Encounter: Payer: Self-pay | Admitting: Surgery

## 2011-10-12 ENCOUNTER — Encounter: Payer: Self-pay | Admitting: Surgery

## 2011-10-12 ENCOUNTER — Ambulatory Visit (INDEPENDENT_AMBULATORY_CARE_PROVIDER_SITE_OTHER): Payer: Medicare Other | Admitting: Surgery

## 2011-10-12 ENCOUNTER — Ambulatory Visit (INDEPENDENT_AMBULATORY_CARE_PROVIDER_SITE_OTHER): Payer: Medicare Other | Admitting: Vascular Surgery

## 2011-10-12 VITALS — BP 141/68 | HR 64 | Resp 16 | Ht 70.0 in | Wt 203.0 lb

## 2011-10-12 DIAGNOSIS — Z48812 Encounter for surgical aftercare following surgery on the circulatory system: Secondary | ICD-10-CM

## 2011-10-12 DIAGNOSIS — I6529 Occlusion and stenosis of unspecified carotid artery: Secondary | ICD-10-CM

## 2011-10-12 DIAGNOSIS — I701 Atherosclerosis of renal artery: Secondary | ICD-10-CM | POA: Diagnosis not present

## 2011-10-12 NOTE — Progress Notes (Signed)
[facility] [place]   Patient name: James [name] MRN: [id number] DOB: 1946/01/31 Sex: male     Chief Complaint  Patient presents with  . Carotid    one year f/up w/Labs  Carotid and Renal Artery    HISTORY OF PRESENT ILLNESS: The patient comes back today for followup. He is status post right carotid endarterectomy in September of 2008. This was initially scheduled as an asymptomatic operation with greater than 80% stenosis by ultrasound. Upon interviewing the patient prior to his operation he describes periodic numbness in his left hand over the preceding several days. Intraoperative findings revealed a tight 95% stenosis. The patient has also undergone right renal artery stenting in July of 2009 4 uncontrolled hypertension. This was associated with elevated creatinine. He is back today for routine followup. He denies having any symptoms. Specifically he denies numbness or weakness in either extremity. He denies slurred speech she denies amaurosis fugax. He states his blood pressure runs in the 125 to 130 range. He denies symptoms of claudication  Past Medical History  Diagnosis Date  . Hypertension   . Arthritis   . Hyperlipidemia   . H/O: GI bleed   . Carotid artery occlusion   . H/O renal artery stenosis   . DVT (deep venous thrombosis) 2008    Past Surgical History  Procedure Date  . Renal artery stent 2009    Right renal artery stenting  . Carotid endarterectomy 2008    right CEA    History   Social History  . Marital Status: Married    Spouse Name: N/A    Number of Children: N/A  . Years of Education: N/A   Occupational History  . Not on file.   Social History Main Topics  . Smoking status: Former Research scientist (life sciences)  . Smokeless tobacco: Former Systems developer    Quit date: 12/26/1999  . Alcohol Use: No  . Drug Use: No  . Sexually Active:    Other Topics Concern  . Not on file   Social History Narrative  . No narrative on file    Family History    Problem Relation Age of Onset  . Hypertension Mother   . Hyperlipidemia Father   . Hypertension Father   . Hypertension Sister   . Hypertension Brother     Allergies as of 10/12/2011  . (No Known Allergies)    Current Outpatient Prescriptions on File Prior to Visit  Medication Sig Dispense Refill  . amLODipine (NORVASC) 10 MG tablet Take 10 mg by mouth daily.      Marland Kitchen aspirin 81 MG tablet Take 81 mg by mouth daily.      . fish oil-omega-3 fatty acids 1000 MG capsule Take 1 g by mouth daily.      . metoprolol (LOPRESSOR) 50 MG tablet Take 50 mg by mouth daily.      . pantoprazole (PROTONIX) 40 MG tablet Take 40 mg by mouth daily.      . simvastatin (ZOCOR) 20 MG tablet Take 20 mg by mouth at bedtime.         REVIEW OF SYSTEMS: See history of present illness otherwise negative  PHYSICAL EXAMINATION:   Vital signs are BP 141/68  Pulse 64  Resp 16  Ht 5\' 10"  (1.778 m)  Wt 203 lb (92.08 kg)  BMI 29.13 kg/m2  SpO2 100% General: The patient appears their stated age. HEENT:  No gross abnormalities Pulmonary:  Non labored breathing Abdomen: Soft and non-tender no abdominal  bruits Musculoskeletal: There are no major deformities. Neurologic: No focal weakness or paresthesias are detected, Skin: There are no ulcer or rashes noted. Psychiatric: The patient has normal affect. Cardiovascular: There is a regular rate and rhythm without significant murmur appreciated. No carotid bruits   Diagnostic Studies Renal ultrasound shows a widely patent right renal artery stent without elevated velocities.  Carotid ultrasound reveals a widely patent right carotid endarterectomy site. The stenosis in the left carotid artery has increased. His is in the 60-79% range. He also has common carotid disease.  Assessment: Renal stenosis and carotid stenosis. Plan: The patient's renal artery stenosis has been corrected with right renal artery stenting. Ultrasound shows he has no evidence of renal  artery stenosis. I will follow this with a repeat ultrasound in one year.  With regards to his carotid arteries, his endarterectomy site on the right remains widely patent. There has been slight progression on the left side. For that reason now have him come back in 6 months for a repeat duplex. I may need to proceed with angiogram or CT angiogram to fully evaluate the disease within his common carotid artery as this is not well characterized by duplex ultrasound  V. Leia Alf, M.D. [facility] [place] Office: [phone] Pager:  [phone]

## 2011-10-19 NOTE — Procedures (Unsigned)
RENAL ARTERY DUPLEX EVALUATION  INDICATION:  Renal artery stenosis, history of right renal stent placement 02/08/2008  HISTORY: Diabetes:  no Cardiac:  no Hypertension:  yes Smoking:  previous  RENAL ARTERY DUPLEX FINDINGS: Aorta-Proximal:  80 cm/s Aorta-Mid:  87 cm/s Aorta-Distal:  220 cm/s Celiac Artery Origin:  133 cm/s SMA Origin:  341 cm/s                                   RIGHT               LEFT Renal Artery Origin:             141 cm/s            118 cm/s Renal Artery Proximal:           157 cm/s            171 cm/s Renal Artery Mid:                167 cm/s            175 cm/s Renal Artery Distal:             87 cm/s             88 cm/s Hilar Acceleration Time (AT): Renal-Aortic Ratio (RAR):        2.09                2.19 Kidney Size:                     10.1 cm X  cm       11.3 cm X  cm End Diastolic Ratio (EDR): Resistive Index (RI):  IMPRESSION:  Patent right renal artery stent without significantly increased velocities of the bilateral renal arteries noted. Bilateral renal diameters are within normal limits. Bilateral renal aortic ratios are consistent with stenosis of less than 60% bilaterally. Elevated velocity noted at the distal aorta of 220 cm/s which may suggest stenosis. Essentially unchanged renal artery stenosis since study on 04/04/2010.  ___________________________________________ V. Leia Alf, MD  SH/MEDQ  D:  10/12/2011  T:  10/12/2011  Job:  [id number]

## 2011-10-19 NOTE — Procedures (Unsigned)
CAROTID DUPLEX EXAM  INDICATION:  Carotid stenosis  HISTORY: Diabetes:  No Cardiac:  No Hypertension:  Yes Smoking:  Previous Previous Surgery:  Right carotid endarterectomy on 04/21/2007 CV History:  Currently asymptomatic Amaurosis Fugax No, Paresthesias No, Hemiparesis No                                      RIGHT             LEFT Brachial systolic pressure:         158               150 Brachial Doppler waveforms:         WNL               WNL Vertebral direction of flow:        Antegrade         Antegrade DUPLEX VELOCITIES (cm/sec) CCA peak systolic                   120               [id number] ECA peak systolic                   89                0000000 ICA peak systolic                   79                [id number] ICA end diastolic                   22                93 PLAQUE MORPHOLOGY:                  Not visualized    Heterogeneous PLAQUE AMOUNT:                      Not visualized    Moderate to severe PLAQUE LOCATION:                    Not visualized    CCA/ICA origin  IMPRESSION: 1. Right internal carotid artery is patent with history of     endarterectomy.  No hyperplasia or stenosis is evident. 2. Right external carotid artery is patent. 3. Left external carotid artery stenosis present. 4. Left internal carotid artery stenosis estimated in the 60%-79%     range (high end of range), this may be underestimated due to distal     common carotid artery disease. 5. Stable on the right and slight increase on the left since study on     10/06/2010.  ___________________________________________ V. Leia Alf, MD  SH/MEDQ  D:  10/12/2011  T:  10/12/2011  Job:  [id number]

## 2011-10-22 DIAGNOSIS — I10 Essential (primary) hypertension: Secondary | ICD-10-CM | POA: Diagnosis not present

## 2011-10-22 DIAGNOSIS — Z125 Encounter for screening for malignant neoplasm of prostate: Secondary | ICD-10-CM | POA: Diagnosis not present

## 2011-10-22 DIAGNOSIS — E559 Vitamin D deficiency, unspecified: Secondary | ICD-10-CM | POA: Diagnosis not present

## 2011-10-22 DIAGNOSIS — E785 Hyperlipidemia, unspecified: Secondary | ICD-10-CM | POA: Diagnosis not present

## 2011-10-22 DIAGNOSIS — D539 Nutritional anemia, unspecified: Secondary | ICD-10-CM | POA: Diagnosis not present

## 2011-10-22 DIAGNOSIS — M109 Gout, unspecified: Secondary | ICD-10-CM | POA: Diagnosis not present

## 2012-01-19 DIAGNOSIS — S61409A Unspecified open wound of unspecified hand, initial encounter: Secondary | ICD-10-CM | POA: Diagnosis not present

## 2012-04-08 ENCOUNTER — Other Ambulatory Visit: Payer: Self-pay | Admitting: *Deleted

## 2012-04-08 DIAGNOSIS — Z48812 Encounter for surgical aftercare following surgery on the circulatory system: Secondary | ICD-10-CM

## 2012-04-08 DIAGNOSIS — I6529 Occlusion and stenosis of unspecified carotid artery: Secondary | ICD-10-CM

## 2012-04-11 ENCOUNTER — Other Ambulatory Visit: Payer: Self-pay | Admitting: *Deleted

## 2012-04-11 DIAGNOSIS — Z48812 Encounter for surgical aftercare following surgery on the circulatory system: Secondary | ICD-10-CM

## 2012-04-11 DIAGNOSIS — I701 Atherosclerosis of renal artery: Secondary | ICD-10-CM

## 2012-04-13 ENCOUNTER — Other Ambulatory Visit (INDEPENDENT_AMBULATORY_CARE_PROVIDER_SITE_OTHER): Payer: Medicare Other | Admitting: *Deleted

## 2012-04-13 DIAGNOSIS — I6529 Occlusion and stenosis of unspecified carotid artery: Secondary | ICD-10-CM | POA: Diagnosis not present

## 2012-04-13 DIAGNOSIS — Z48812 Encounter for surgical aftercare following surgery on the circulatory system: Secondary | ICD-10-CM

## 2012-04-20 DIAGNOSIS — K219 Gastro-esophageal reflux disease without esophagitis: Secondary | ICD-10-CM | POA: Diagnosis not present

## 2012-04-20 DIAGNOSIS — I10 Essential (primary) hypertension: Secondary | ICD-10-CM | POA: Diagnosis not present

## 2012-04-20 DIAGNOSIS — E785 Hyperlipidemia, unspecified: Secondary | ICD-10-CM | POA: Diagnosis not present

## 2012-04-20 DIAGNOSIS — M109 Gout, unspecified: Secondary | ICD-10-CM | POA: Diagnosis not present

## 2012-04-22 ENCOUNTER — Telehealth: Payer: Self-pay | Admitting: Oncology

## 2012-04-22 NOTE — Telephone Encounter (Signed)
S/w the pt and he is aware of his nov 2013 appts

## 2012-05-06 ENCOUNTER — Encounter: Payer: Self-pay | Admitting: Surgery

## 2012-05-09 ENCOUNTER — Ambulatory Visit (INDEPENDENT_AMBULATORY_CARE_PROVIDER_SITE_OTHER): Payer: Medicare Other | Admitting: Surgery

## 2012-05-09 ENCOUNTER — Encounter: Payer: Self-pay | Admitting: Surgery

## 2012-05-09 VITALS — BP 138/70 | HR 65 | Ht 70.0 in | Wt 202.0 lb

## 2012-05-09 DIAGNOSIS — I6529 Occlusion and stenosis of unspecified carotid artery: Secondary | ICD-10-CM

## 2012-05-09 NOTE — Progress Notes (Signed)
[facility] [place]   Patient name: James [name] MRN: [id number] DOB: 07-12-1946 Sex: male     Chief Complaint  Patient presents with  . Carotid    discuss results of V lab from 04/13/2012    HISTORY OF PRESENT ILLNESS: The patient comes in today for evaluation of his left carotid stenosis. He is well known to me from having undergone right carotid endarterectomy and September of 2008 as well as a right renal stent in July 2009. He has been unable to take Plavix secondary to a GI bleed. He has had progression of the stenosis within his left carotid artery is now progressed to greater than 80%.  He comes in to have this further evaluated. He denies symptoms. Specifically, he denies numbness or weakness in either extremity. He denies slurred speech. He denies amaurosis fugax.  The patient continues to be medically managed for his hypertension and hyperlipidemia. He has had a GI bleed which required small bowel resection.  Past Medical History  Diagnosis Date  . Hypertension   . Arthritis   . Hyperlipidemia   . H/O: GI bleed   . Carotid artery occlusion   . H/O renal artery stenosis   . DVT (deep venous thrombosis) 2008    Past Surgical History  Procedure Date  . Renal artery stent 2009    Right renal artery stenting  . Carotid endarterectomy 2008    right CEA    History   Social History  . Marital Status: Married    Spouse Name: N/A    Number of Children: N/A  . Years of Education: N/A   Occupational History  . Not on file.   Social History Main Topics  . Smoking status: Former Research scientist (life sciences)  . Smokeless tobacco: Former Systems developer    Quit date: 12/26/1999  . Alcohol Use: No  . Drug Use: No  . Sexually Active: Not on file   Other Topics Concern  . Not on file   Social History Narrative  . No narrative on file    Family History  Problem Relation Age of Onset  . Hypertension Mother   . Hyperlipidemia Father   . Hypertension Father   .  Hypertension Sister   . Hypertension Brother     Allergies as of 05/09/2012  . (No Known Allergies)    Current Outpatient Prescriptions on File Prior to Visit  Medication Sig Dispense Refill  . allopurinol (ZYLOPRIM) 100 MG tablet Take 100 mg by mouth daily.      Marland Kitchen amLODipine (NORVASC) 10 MG tablet Take 10 mg by mouth daily.      Marland Kitchen aspirin 81 MG tablet Take 81 mg by mouth daily.      Marland Kitchen atorvastatin (LIPITOR) 40 MG tablet Take 40 mg by mouth daily.      . fish oil-omega-3 fatty acids 1000 MG capsule Take 1 g by mouth daily.      . metoprolol (LOPRESSOR) 50 MG tablet Take 50 mg by mouth daily.      . pantoprazole (PROTONIX) 40 MG tablet Take 40 mg by mouth daily.      . simvastatin (ZOCOR) 20 MG tablet Take 20 mg by mouth at bedtime.         REVIEW OF SYSTEMS: Cardiovascular: No chest pain, chest pressure, palpitations, orthopnea, or dyspnea on exertion. No claudication or rest pain,  Pulmonary: No productive cough, asthma or wheezing. Neurologic: No weakness, paresthesias, aphasia, or amaurosis. No dizziness. Hematologic: History of GI bleeding,  now resolved after small bowel resection Musculoskeletal: No joint pain or joint swelling. Gastrointestinal: No blood in stool or hematemesis current Genitourinary: No dysuria or hematuria. Psychiatric:: No history of major depression. Integumentary: No rashes or ulcers. Constitutional: No fever or chills.  PHYSICAL EXAMINATION:   Vital signs are BP 138/70  Pulse 65  Ht 5\' 10"  (1.778 m)  Wt 202 lb (91.627 kg)  BMI 28.98 kg/m2  SpO2 100% General: The patient appears their stated age. HEENT:  No gross abnormalities Pulmonary:  Non labored breathing Abdomen: Soft and non-tender Musculoskeletal: There are no major deformities. Neurologic: No focal weakness or paresthesias are detected, Skin: There are no ulcer or rashes noted. Psychiatric: The patient has normal affect. Cardiovascular: There is a regular rate and rhythm without  significant murmur appreciated. No abdominal bruit. No carotid bruit   Diagnostic Studies Duplex ultrasound has been reviewed. This shows greater than 80% left carotid stenosis. He has a widely patent right endarterectomy site. The patient has normal carotid past the stenosis. His bifurcation his mid hyoid.  Assessment: Asymptomatic left carotid stenosis. Plan: The patient has been scheduled for left carotid endarterectomy to be performed on Thursday, November 7. The risks and benefits have been discussed with the patient, including the risk of nerve injury, the risk of stroke, the risk of cardiopulmonary complications, and the risk of infection. All of his questions were answered. Because he has previously undergone right carotid endarterectomy I will sent into the ENTto have his vocal cords evaluated prior to his operation. I have told him not to stop his aspirin  V. Leia Alf, M.D. [facility] [place] Office: [phone] Pager:  [phone]

## 2012-05-11 ENCOUNTER — Other Ambulatory Visit: Payer: Self-pay | Admitting: *Deleted

## 2012-05-12 DIAGNOSIS — I6529 Occlusion and stenosis of unspecified carotid artery: Secondary | ICD-10-CM | POA: Diagnosis not present

## 2012-05-19 ENCOUNTER — Encounter (HOSPITAL_COMMUNITY): Payer: Self-pay | Admitting: Pharmacy Technician

## 2012-05-23 ENCOUNTER — Encounter (HOSPITAL_COMMUNITY): Payer: Self-pay

## 2012-05-23 ENCOUNTER — Encounter (HOSPITAL_COMMUNITY)
Admission: RE | Admit: 2012-05-23 | Discharge: 2012-05-23 | Disposition: A | Discharge status: Home / Self Care | Payer: Medicare Other | Source: Ambulatory Visit | Attending: Surgery | Admitting: Surgery

## 2012-05-23 DIAGNOSIS — I10 Essential (primary) hypertension: Secondary | ICD-10-CM | POA: Diagnosis not present

## 2012-05-23 DIAGNOSIS — I2789 Other specified pulmonary heart diseases: Secondary | ICD-10-CM | POA: Diagnosis not present

## 2012-05-23 DIAGNOSIS — I6529 Occlusion and stenosis of unspecified carotid artery: Secondary | ICD-10-CM | POA: Diagnosis not present

## 2012-05-23 DIAGNOSIS — E785 Hyperlipidemia, unspecified: Secondary | ICD-10-CM | POA: Diagnosis not present

## 2012-05-23 DIAGNOSIS — M109 Gout, unspecified: Secondary | ICD-10-CM | POA: Diagnosis not present

## 2012-05-23 HISTORY — DX: Effusion, unspecified joint: M25.40

## 2012-05-23 HISTORY — DX: Personal history of other diseases of the digestive system: Z87.19

## 2012-05-23 HISTORY — DX: Personal history of colonic polyps: Z86.010

## 2012-05-23 HISTORY — DX: Personal history of colon polyps, unspecified: Z86.0100

## 2012-05-23 HISTORY — DX: Personal history of peptic ulcer disease: Z87.11

## 2012-05-23 HISTORY — DX: Gastro-esophageal reflux disease without esophagitis: K21.9

## 2012-05-23 HISTORY — DX: Pain in unspecified joint: M25.50

## 2012-05-23 HISTORY — DX: Personal history of other medical treatment: Z92.89

## 2012-05-23 HISTORY — DX: Gout, unspecified: M10.9

## 2012-05-23 LAB — URINALYSIS, ROUTINE W REFLEX MICROSCOPIC
Bilirubin Urine: NEGATIVE
Glucose, UA: NEGATIVE mg/dL
Hgb urine dipstick: NEGATIVE
Ketones, ur: NEGATIVE mg/dL
Leukocytes, UA: NEGATIVE
Nitrite: NEGATIVE
Protein, ur: NEGATIVE mg/dL
Specific Gravity, Urine: 1.019 (ref 1.005–1.030)
Urobilinogen, UA: 1 mg/dL (ref 0.0–1.0)
pH: 6.5 (ref 5.0–8.0)

## 2012-05-23 LAB — COMPREHENSIVE METABOLIC PANEL
ALT: 18 U/L (ref 0–53)
AST: 18 U/L (ref 0–37)
Albumin: 4 g/dL (ref 3.5–5.2)
Alkaline Phosphatase: 154 U/L — ABNORMAL HIGH (ref 39–117)
BUN: 13 mg/dL (ref 6–23)
CO2: 30 mEq/L (ref 19–32)
Calcium: 9.4 mg/dL (ref 8.4–10.5)
Chloride: 103 mEq/L (ref 96–112)
Creatinine, Ser: 1.17 mg/dL (ref 0.50–1.35)
GFR calc Af Amer: 73 mL/min — ABNORMAL LOW (ref >90)
GFR calc non Af Amer: 63 mL/min — ABNORMAL LOW (ref >90)
Glucose, Bld: 97 mg/dL (ref 70–99)
Potassium: 3.9 mEq/L (ref 3.5–5.1)
Sodium: 141 mEq/L (ref 135–145)
Total Bilirubin: 0.7 mg/dL (ref 0.3–1.2)
Total Protein: 7.5 g/dL (ref 6.0–8.3)

## 2012-05-23 LAB — CBC
HCT: 42 % (ref 39.0–52.0)
Hemoglobin: 14.6 g/dL (ref 13.0–17.0)
MCH: 28.1 pg (ref 26.0–34.0)
MCHC: 34.8 g/dL (ref 30.0–36.0)
MCV: 80.9 fL (ref 78.0–100.0)
Platelets: 217 10*3/uL (ref 150–400)
RBC: 5.19 MIL/uL (ref 4.22–5.81)
RDW: 13.5 % (ref 11.5–15.5)
WBC: 8.9 10*3/uL (ref 4.0–10.5)

## 2012-05-23 LAB — TYPE AND SCREEN
ABO/RH(D): O NEG
Antibody Screen: NEGATIVE

## 2012-05-23 LAB — SURGICAL PCR SCREEN
MRSA, PCR: NEGATIVE
Staphylococcus aureus: NEGATIVE

## 2012-05-23 LAB — PROTIME-INR
INR: 0.93 (ref 0.00–1.49)
Prothrombin Time: 12.4 seconds (ref 11.6–15.2)

## 2012-05-23 LAB — APTT: aPTT: 40 seconds — ABNORMAL HIGH (ref 24–37)

## 2012-05-23 NOTE — Pre-Procedure Instructions (Signed)
[place]  05/23/2012   Your procedure is scheduled on:  Thurs,Nov 7 @ 7:30 AM  Report to [place] at 5:30 AM.  Call this number if you have problems the morning of surgery: [phone]   Remember:   Do not eat food:After Midnight.    Take these medicines the morning of surgery with A SIP OF WATER: Allopurinol(Zyloprim),Amlodipine(Norvasc),Metoprolol(Lopressor),and Pantoprazole(Protonix)   Do not wear jewelry  Do not wear lotions, powders, or colognes. You may wear deodorant.  Men may shave face and neck.  Do not bring valuables to the hospital.  Contacts, dentures or bridgework may not be worn into surgery.  Leave suitcase in the car. After surgery it may be brought to your room.  For patients admitted to the hospital, checkout time is 11:00 AM the day of discharge.   Patients discharged the day of surgery will not be allowed to drive home.    Special Instructions: Shower using CHG 2 nights before surgery and the night before surgery.  If you shower the day of surgery use CHG.  Use special wash - you have one bottle of CHG for all showers.  You should use approximately 1/3 of the bottle for each shower.   Please read over the following fact sheets that you were given: Pain Booklet, Coughing and Deep Breathing, Blood Transfusion Information, MRSA Information and Surgical Site Infection Prevention

## 2012-05-23 NOTE — Progress Notes (Addendum)
Pt doesn't have a cardiologist-saw Dr.Solomon only once for clearance for a surgery in 2008  Stress test done many,many yrs ago per pt Denies ever having an echo or heart cath  Dr.Moore with [facility] in [place] is Medical MD     Denies EKG or CXR being done within the past yr--more like 21yrs ago

## 2012-05-24 NOTE — Consult Note (Signed)
Anesthesia chart review: Patient is a 66 year old male scheduled for a left carotid endarterectomy by Dr. Trula Slade on 06/02/2012. History includes right CEA 03/2007, former smoker, GERD, renal artery stenosis status post right renal stent '09, DVT 2008, gout, gastric ulcer, colon polyps, hypertension, hyperlipidemia, arthritis.  PCP is Dr. Laurance Flatten at [facility] in [place].  Labs noted.  CXR on 05/23/2012 showed no evidence of acute cardiopulmonary disease.  EKG on 05/23/2012 showed normal sinus rhythm, cannot rule out anterior infarct, age undetermined.  He had finding of cannot rule out anterior infarct on a prior EKG dating back to 01/08/00.  He reported saw Dr. Elisabeth Cara (formerly of [facility]) prior to his CEA in 2008 for a pre-operative evaluation, but is not routinely followed by a Cardiologist.  No CV symptoms were documented at his PAT appointment.  Clinical correlation on the day of surgery, but anticipate that if he remains asymptomatic then he can proceed as planned.  Myra Gianotti, PA-C

## 2012-05-27 ENCOUNTER — Telehealth: Payer: Self-pay | Admitting: *Deleted

## 2012-05-27 ENCOUNTER — Ambulatory Visit: Payer: Medicare Other | Admitting: Oncology

## 2012-05-27 ENCOUNTER — Other Ambulatory Visit: Payer: Self-pay | Admitting: *Deleted

## 2012-05-27 ENCOUNTER — Other Ambulatory Visit: Payer: Medicare Other

## 2012-05-27 DIAGNOSIS — D500 Iron deficiency anemia secondary to blood loss (chronic): Secondary | ICD-10-CM

## 2012-05-27 NOTE — Telephone Encounter (Signed)
Left voice message to inform the patient of the new date and time on 05-30-2012 starting at 9:30am with labs

## 2012-05-27 NOTE — Telephone Encounter (Signed)
Spoke with James [name] and was informed that James [name] will not be able to keep f/u appt today due to upset stomach.   James [name] wished to reschedule f/u appt with Dr. Ralene Ok.   MD notified. James [name]'s  Phone   [phone].

## 2012-05-27 NOTE — Telephone Encounter (Signed)
Pt called and asking for a f/u appt in December with Dr. Ralene Ok.  Pt stated he will have Left Carotid Artery surgery by Dr. Abbie Sons on  Jun 02, 2012.    Pt wished to f/u with md at a later date after surgery.   Message to Dr. Ralene Ok for review.

## 2012-05-30 ENCOUNTER — Other Ambulatory Visit: Payer: Medicare Other | Admitting: Lab

## 2012-05-30 ENCOUNTER — Ambulatory Visit: Payer: Medicare Other | Admitting: Family

## 2012-06-01 MED ORDER — DEXTROSE 5 % IV SOLN
1.5000 g | INTRAVENOUS | Status: AC
  Administered 2012-06-02: 1.5 g via INTRAVENOUS
  Filled 2012-06-01: qty 1.5

## 2012-06-02 ENCOUNTER — Encounter (HOSPITAL_COMMUNITY)
Admission: RE | Disposition: A | Discharge status: Home / Self Care | Payer: Self-pay | Source: Ambulatory Visit | Attending: Surgery

## 2012-06-02 ENCOUNTER — Encounter (HOSPITAL_COMMUNITY): Payer: Self-pay | Admitting: Vascular Surgery

## 2012-06-02 ENCOUNTER — Encounter (HOSPITAL_COMMUNITY): Payer: Self-pay | Admitting: *Deleted

## 2012-06-02 ENCOUNTER — Inpatient Hospital Stay (HOSPITAL_COMMUNITY): Payer: Medicare Other | Admitting: Vascular Surgery

## 2012-06-02 ENCOUNTER — Inpatient Hospital Stay (HOSPITAL_COMMUNITY)
Admission: RE | Admit: 2012-06-02 | Discharge: 2012-06-03 | DRG: 039 | Disposition: A | Discharge status: Home / Self Care | Payer: Medicare Other | Source: Ambulatory Visit | Attending: Surgery | Admitting: Surgery

## 2012-06-02 DIAGNOSIS — Z79899 Other long term (current) drug therapy: Secondary | ICD-10-CM | POA: Diagnosis not present

## 2012-06-02 DIAGNOSIS — Z8711 Personal history of peptic ulcer disease: Secondary | ICD-10-CM

## 2012-06-02 DIAGNOSIS — Z86718 Personal history of other venous thrombosis and embolism: Secondary | ICD-10-CM | POA: Diagnosis not present

## 2012-06-02 DIAGNOSIS — I739 Peripheral vascular disease, unspecified: Secondary | ICD-10-CM | POA: Diagnosis present

## 2012-06-02 DIAGNOSIS — M1990 Unspecified osteoarthritis, unspecified site: Secondary | ICD-10-CM | POA: Diagnosis present

## 2012-06-02 DIAGNOSIS — I10 Essential (primary) hypertension: Secondary | ICD-10-CM | POA: Diagnosis present

## 2012-06-02 DIAGNOSIS — Z860100 Personal history of colon polyps, unspecified: Secondary | ICD-10-CM

## 2012-06-02 DIAGNOSIS — M109 Gout, unspecified: Secondary | ICD-10-CM | POA: Diagnosis present

## 2012-06-02 DIAGNOSIS — I6529 Occlusion and stenosis of unspecified carotid artery: Principal | ICD-10-CM | POA: Diagnosis present

## 2012-06-02 DIAGNOSIS — Z87891 Personal history of nicotine dependence: Secondary | ICD-10-CM | POA: Diagnosis not present

## 2012-06-02 DIAGNOSIS — E785 Hyperlipidemia, unspecified: Secondary | ICD-10-CM | POA: Diagnosis not present

## 2012-06-02 DIAGNOSIS — K219 Gastro-esophageal reflux disease without esophagitis: Secondary | ICD-10-CM | POA: Diagnosis present

## 2012-06-02 DIAGNOSIS — Z7982 Long term (current) use of aspirin: Secondary | ICD-10-CM | POA: Diagnosis not present

## 2012-06-02 HISTORY — PX: PATCH ANGIOPLASTY: SHX6230

## 2012-06-02 HISTORY — PX: ENDARTERECTOMY: SHX5162

## 2012-06-02 SURGERY — ENDARTERECTOMY, CAROTID
Anesthesia: General | Site: Neck | Laterality: Left | Wound class: Clean

## 2012-06-02 MED ORDER — PROTAMINE SULFATE 10 MG/ML IV SOLN
INTRAVENOUS | Status: DC | PRN
  Administered 2012-06-02: 10 mg via INTRAVENOUS
  Administered 2012-06-02: 40 mg via INTRAVENOUS

## 2012-06-02 MED ORDER — PROPOFOL 10 MG/ML IV BOLUS
INTRAVENOUS | Status: DC | PRN
  Administered 2012-06-02: 200 mg via INTRAVENOUS

## 2012-06-02 MED ORDER — LIDOCAINE HCL (PF) 1 % IJ SOLN
INTRAMUSCULAR | Status: AC
  Filled 2012-06-02: qty 30

## 2012-06-02 MED ORDER — ASPIRIN 81 MG PO CHEW
81.0000 mg | CHEWABLE_TABLET | Freq: Two times a day (BID) | ORAL | Status: DC
  Administered 2012-06-03: 81 mg via ORAL
  Filled 2012-06-02: qty 1

## 2012-06-02 MED ORDER — PHENOL 1.4 % MT LIQD: 1.0000 | OROMUCOSAL | Status: DC | PRN

## 2012-06-02 MED ORDER — HYDRALAZINE HCL 20 MG/ML IJ SOLN: 10.0000 mg | INTRAMUSCULAR | Status: DC | PRN

## 2012-06-02 MED ORDER — SODIUM CHLORIDE 0.9 % IV SOLN: 500.0000 mL | Freq: Once | INTRAVENOUS | Status: AC | PRN

## 2012-06-02 MED ORDER — LIDOCAINE HCL (CARDIAC) 20 MG/ML IV SOLN
INTRAVENOUS | Status: DC | PRN
  Administered 2012-06-02: 100 mg via INTRAVENOUS

## 2012-06-02 MED ORDER — LACTATED RINGERS IV SOLN
INTRAVENOUS | Status: DC | PRN
  Administered 2012-06-02: 07:00:00 via INTRAVENOUS

## 2012-06-02 MED ORDER — OXYCODONE-ACETAMINOPHEN 5-325 MG PO TABS: 1.0000 | ORAL_TABLET | ORAL | Status: DC | PRN

## 2012-06-02 MED ORDER — METOPROLOL TARTRATE 50 MG PO TABS
50.0000 mg | ORAL_TABLET | Freq: Every day | ORAL | Status: DC
  Administered 2012-06-03: 50 mg via ORAL
  Filled 2012-06-02: qty 1

## 2012-06-02 MED ORDER — ROCURONIUM BROMIDE 100 MG/10ML IV SOLN
INTRAVENOUS | Status: DC | PRN
  Administered 2012-06-02: 50 mg via INTRAVENOUS

## 2012-06-02 MED ORDER — GLYCOPYRROLATE 0.2 MG/ML IJ SOLN
INTRAMUSCULAR | Status: DC | PRN
Start: 2012-06-02 — End: 2012-06-02
  Administered 2012-06-02: .4 mg via INTRAVENOUS

## 2012-06-02 MED ORDER — SODIUM CHLORIDE 0.9 % IR SOLN
Status: DC | PRN
  Administered 2012-06-02: 07:00:00

## 2012-06-02 MED ORDER — ALUM & MAG HYDROXIDE-SIMETH 200-200-20 MG/5ML PO SUSP: 15.0000 mL | ORAL | Status: DC | PRN

## 2012-06-02 MED ORDER — METOCLOPRAMIDE HCL 5 MG/ML IJ SOLN: 10.0000 mg | Freq: Once | INTRAMUSCULAR | Status: DC | PRN

## 2012-06-02 MED ORDER — ACETAMINOPHEN 325 MG PO TABS: 325.0000 mg | ORAL_TABLET | ORAL | Status: DC | PRN

## 2012-06-02 MED ORDER — HEPARIN SODIUM (PORCINE) 1000 UNIT/ML IJ SOLN
INTRAMUSCULAR | Status: DC | PRN
  Administered 2012-06-02: 8000 [IU] via INTRAVENOUS

## 2012-06-02 MED ORDER — OXYCODONE HCL 5 MG PO TABS: 5.0000 mg | ORAL_TABLET | Freq: Once | ORAL | Status: DC | PRN

## 2012-06-02 MED ORDER — METOPROLOL TARTRATE 1 MG/ML IV SOLN: 2.0000 mg | INTRAVENOUS | Status: DC | PRN

## 2012-06-02 MED ORDER — PHENYLEPHRINE HCL 10 MG/ML IJ SOLN
10.0000 mg | INTRAVENOUS | Status: DC | PRN
  Administered 2012-06-02: 25 ug/min via INTRAVENOUS

## 2012-06-02 MED ORDER — NEOSTIGMINE METHYLSULFATE 1 MG/ML IJ SOLN
INTRAMUSCULAR | Status: DC | PRN
  Administered 2012-06-02: 3 mg via INTRAVENOUS

## 2012-06-02 MED ORDER — FENTANYL CITRATE 0.05 MG/ML IJ SOLN
INTRAMUSCULAR | Status: DC | PRN
  Administered 2012-06-02 (×2): 50 ug via INTRAVENOUS
  Administered 2012-06-02: 100 ug via INTRAVENOUS

## 2012-06-02 MED ORDER — FENTANYL CITRATE 0.05 MG/ML IJ SOLN
INTRAMUSCULAR | Status: AC
  Administered 2012-06-02: 50 ug via INTRAVENOUS
  Filled 2012-06-02: qty 2

## 2012-06-02 MED ORDER — ONDANSETRON HCL 4 MG/2ML IJ SOLN
INTRAMUSCULAR | Status: DC | PRN
  Administered 2012-06-02: 4 mg via INTRAVENOUS

## 2012-06-02 MED ORDER — LABETALOL HCL 5 MG/ML IV SOLN
INTRAVENOUS | Status: DC | PRN
  Administered 2012-06-02 (×2): 5 mg via INTRAVENOUS

## 2012-06-02 MED ORDER — DEXTROSE 5 % IV SOLN
INTRAVENOUS | Status: DC | PRN
  Administered 2012-06-02: 08:00:00 via INTRAVENOUS

## 2012-06-02 MED ORDER — SIMVASTATIN 20 MG PO TABS
20.0000 mg | ORAL_TABLET | Freq: Every day | ORAL | Status: DC
Start: 2012-06-02 — End: 2012-06-03
  Administered 2012-06-02: 20 mg via ORAL
  Filled 2012-06-02 (×2): qty 1

## 2012-06-02 MED ORDER — HEPARIN (PORCINE) IN NACL 100-0.45 UNIT/ML-% IJ SOLN: 400.0000 [IU]/h | INTRAMUSCULAR | Status: DC

## 2012-06-02 MED ORDER — PANTOPRAZOLE SODIUM 40 MG PO TBEC
40.0000 mg | DELAYED_RELEASE_TABLET | Freq: Every day | ORAL | Status: DC
  Administered 2012-06-03: 40 mg via ORAL
  Filled 2012-06-02: qty 1

## 2012-06-02 MED ORDER — POTASSIUM CHLORIDE CRYS ER 20 MEQ PO TBCR: 20.0000 meq | EXTENDED_RELEASE_TABLET | Freq: Every day | ORAL | Status: DC | PRN

## 2012-06-02 MED ORDER — ONDANSETRON HCL 4 MG/2ML IJ SOLN: 4.0000 mg | Freq: Four times a day (QID) | INTRAMUSCULAR | Status: DC | PRN

## 2012-06-02 MED ORDER — 0.9 % SODIUM CHLORIDE (POUR BTL) OPTIME
TOPICAL | Status: DC | PRN
  Administered 2012-06-02: 1000 mL

## 2012-06-02 MED ORDER — DOPAMINE-DEXTROSE 3.2-5 MG/ML-% IV SOLN: 3.0000 ug/kg/min | INTRAVENOUS | Status: DC | PRN

## 2012-06-02 MED ORDER — MIDAZOLAM HCL 5 MG/5ML IJ SOLN
INTRAMUSCULAR | Status: DC | PRN
  Administered 2012-06-02 (×2): 1 mg via INTRAVENOUS

## 2012-06-02 MED ORDER — AMLODIPINE BESYLATE 10 MG PO TABS
10.0000 mg | ORAL_TABLET | Freq: Every day | ORAL | Status: DC
  Administered 2012-06-03: 10 mg via ORAL
  Filled 2012-06-02: qty 1

## 2012-06-02 MED ORDER — FENTANYL CITRATE 0.05 MG/ML IJ SOLN
25.0000 ug | INTRAMUSCULAR | Status: DC | PRN
  Administered 2012-06-02: 50 ug via INTRAVENOUS

## 2012-06-02 MED ORDER — DEXTROSE 5 % IV SOLN
1.5000 g | Freq: Two times a day (BID) | INTRAVENOUS | Status: AC
  Administered 2012-06-02 – 2012-06-03 (×2): 1.5 g via INTRAVENOUS
  Filled 2012-06-02 (×2): qty 1.5

## 2012-06-02 MED ORDER — HEMOSTATIC AGENTS (NO CHARGE) OPTIME
TOPICAL | Status: DC | PRN
  Administered 2012-06-02: 1 via TOPICAL

## 2012-06-02 MED ORDER — SODIUM CHLORIDE 0.9 % IV SOLN
INTRAVENOUS | Status: DC
  Administered 2012-06-02: 100 mL/h via INTRAVENOUS
  Administered 2012-06-02: via INTRAVENOUS

## 2012-06-02 MED ORDER — OXYCODONE HCL 5 MG/5ML PO SOLN: 5.0000 mg | Freq: Once | ORAL | Status: DC | PRN

## 2012-06-02 MED ORDER — ARTIFICIAL TEARS OP OINT
TOPICAL_OINTMENT | OPHTHALMIC | Status: DC | PRN
  Administered 2012-06-02: 1 via OPHTHALMIC

## 2012-06-02 MED ORDER — DOCUSATE SODIUM 100 MG PO CAPS
100.0000 mg | ORAL_CAPSULE | Freq: Every day | ORAL | Status: DC
  Administered 2012-06-03: 100 mg via ORAL
  Filled 2012-06-02: qty 1

## 2012-06-02 MED ORDER — ACETAMINOPHEN 650 MG RE SUPP: 325.0000 mg | RECTAL | Status: DC | PRN

## 2012-06-02 MED ORDER — ASPIRIN 81 MG PO TABS: 81.0000 mg | ORAL_TABLET | Freq: Two times a day (BID) | ORAL | Status: DC

## 2012-06-02 MED ORDER — MORPHINE SULFATE 2 MG/ML IJ SOLN
2.0000 mg | INTRAMUSCULAR | Status: DC | PRN
  Administered 2012-06-02: 2 mg via INTRAVENOUS
  Filled 2012-06-02: qty 1

## 2012-06-02 MED ORDER — GUAIFENESIN-DM 100-10 MG/5ML PO SYRP: 15.0000 mL | ORAL_SOLUTION | ORAL | Status: DC | PRN

## 2012-06-02 MED ORDER — LABETALOL HCL 5 MG/ML IV SOLN
10.0000 mg | INTRAVENOUS | Status: DC | PRN
  Administered 2012-06-02: 10 mg via INTRAVENOUS
  Filled 2012-06-02: qty 4

## 2012-06-02 MED ORDER — SODIUM CHLORIDE 0.9 % IV SOLN: INTRAVENOUS | Status: DC

## 2012-06-02 MED ORDER — ALLOPURINOL 100 MG PO TABS
100.0000 mg | ORAL_TABLET | Freq: Every day | ORAL | Status: DC
Start: 2012-06-03 — End: 2012-06-03
  Administered 2012-06-03: 100 mg via ORAL
  Filled 2012-06-02: qty 1

## 2012-06-02 SURGICAL SUPPLY — 54 items
CANISTER SUCTION 2500CC (MISCELLANEOUS) ×2 IMPLANT
CATH ROBINSON RED A/P 18FR (CATHETERS) ×2 IMPLANT
CATH SUCT 10FR WHISTLE TIP (CATHETERS) ×2 IMPLANT
CLIP TI MEDIUM 24 (CLIP) ×2 IMPLANT
CLIP TI WIDE RED SMALL 24 (CLIP) ×2 IMPLANT
CLOTH BEACON ORANGE TIMEOUT ST (SAFETY) ×2 IMPLANT
COVER SURGICAL LIGHT HANDLE (MISCELLANEOUS) ×2 IMPLANT
CRADLE DONUT ADULT HEAD (MISCELLANEOUS) ×2 IMPLANT
DERMABOND ADVANCED (GAUZE/BANDAGES/DRESSINGS) ×1
DERMABOND ADVANCED .7 DNX12 (GAUZE/BANDAGES/DRESSINGS) ×1 IMPLANT
DRAIN CHANNEL 15F RND FF W/TCR (WOUND CARE) IMPLANT
DRAPE WARM FLUID 44X44 (DRAPE) ×2 IMPLANT
ELECT REM PT RETURN 9FT ADLT (ELECTROSURGICAL) ×2
ELECTRODE REM PT RTRN 9FT ADLT (ELECTROSURGICAL) ×1 IMPLANT
EVACUATOR SILICONE 100CC (DRAIN) IMPLANT
GLOVE BIO SURGEON STRL SZ7 (GLOVE) ×2 IMPLANT
GLOVE BIOGEL PI IND STRL 6.5 (GLOVE) ×2 IMPLANT
GLOVE BIOGEL PI IND STRL 7.0 (GLOVE) ×3 IMPLANT
GLOVE BIOGEL PI IND STRL 7.5 (GLOVE) ×1 IMPLANT
GLOVE BIOGEL PI INDICATOR 6.5 (GLOVE) ×2
GLOVE BIOGEL PI INDICATOR 7.0 (GLOVE) ×3
GLOVE BIOGEL PI INDICATOR 7.5 (GLOVE) ×1
GLOVE SURG SS PI 6.5 STRL IVOR (GLOVE) ×6 IMPLANT
GLOVE SURG SS PI 7.0 STRL IVOR (GLOVE) ×2 IMPLANT
GLOVE SURG SS PI 7.5 STRL IVOR (GLOVE) ×2 IMPLANT
GOWN PREVENTION PLUS XLARGE (GOWN DISPOSABLE) ×2 IMPLANT
GOWN PREVENTION PLUS XXLARGE (GOWN DISPOSABLE) ×2 IMPLANT
GOWN STRL NON-REIN LRG LVL3 (GOWN DISPOSABLE) ×6 IMPLANT
GOWN STRL REIN XL XLG (GOWN DISPOSABLE) ×2 IMPLANT
HEMOSTAT SNOW SURGICEL 2X4 (HEMOSTASIS) ×2 IMPLANT
HEMOSTAT SURGICEL 2X14 (HEMOSTASIS) IMPLANT
INSERT FOGARTY SM (MISCELLANEOUS) IMPLANT
KIT BASIN OR (CUSTOM PROCEDURE TRAY) ×2 IMPLANT
KIT ROOM TURNOVER OR (KITS) ×2 IMPLANT
NS IRRIG 1000ML POUR BTL (IV SOLUTION) ×4 IMPLANT
PACK CAROTID (CUSTOM PROCEDURE TRAY) ×2 IMPLANT
PAD ARMBOARD 7.5X6 YLW CONV (MISCELLANEOUS) ×2 IMPLANT
PATCH VASCULAR VASCU GUARD 1X6 (Vascular Products) ×2 IMPLANT
SHUNT CAROTID BYPASS 10 (VASCULAR PRODUCTS) IMPLANT
SHUNT CAROTID BYPASS 12FRX15.5 (VASCULAR PRODUCTS) IMPLANT
SPECIMEN JAR SMALL (MISCELLANEOUS) ×2 IMPLANT
SPONGE INTESTINAL PEANUT (DISPOSABLE) ×2 IMPLANT
SUT ETHILON 3 0 PS 1 (SUTURE) IMPLANT
SUT PROLENE 6 0 BV (SUTURE) ×4 IMPLANT
SUT PROLENE 7 0 BV 1 (SUTURE) IMPLANT
SUT PROLENE 7 0 BV1 MDA (SUTURE) ×2 IMPLANT
SUT SILK 3 0 (SUTURE) ×2
SUT SILK 3-0 18XBRD TIE 12 (SUTURE) ×1 IMPLANT
SUT VIC AB 3-0 SH 27 (SUTURE) ×4
SUT VIC AB 3-0 SH 27X BRD (SUTURE) ×2 IMPLANT
SUT VICRYL 4-0 PS2 18IN ABS (SUTURE) ×2 IMPLANT
TOWEL OR 17X24 6PK STRL BLUE (TOWEL DISPOSABLE) ×2 IMPLANT
TOWEL OR 17X26 10 PK STRL BLUE (TOWEL DISPOSABLE) ×2 IMPLANT
WATER STERILE IRR 1000ML POUR (IV SOLUTION) ×2 IMPLANT

## 2012-06-02 NOTE — Transfer of Care (Signed)
Immediate Anesthesia Transfer of Care Note  Patient: James [name]  Procedure(s) Performed: Procedure(s) (LRB) with comments: ENDARTERECTOMY CAROTID (Left) PATCH ANGIOPLASTY (Left) - using Vascu-Guard Patch  Patient Location: PACU  Anesthesia Type:General  Level of Consciousness: awake, alert , oriented and patient cooperative  Airway & Oxygen Therapy: Patient Spontanous Breathing and Patient connected to nasal cannula oxygen  Post-op Assessment: Report given to PACU RN, Post -op Vital signs reviewed and stable, Patient moving all extremities X 4 and Patient able to stick tongue midline  Post vital signs: Reviewed and stable  Complications: No apparent anesthesia complications

## 2012-06-02 NOTE — Progress Notes (Signed)
Pt. Having difficulty voiding post op.  Will do bladder scan.

## 2012-06-02 NOTE — Addendum Note (Signed)
Addendum  created 06/02/12 1214 by Wanita Chamberlain, CRNA   Modules edited:Anesthesia Medication Administration

## 2012-06-02 NOTE — Discharge Summary (Signed)
[facility] Discharge Summary   Patient ID:  James [name] MRN: [id number] DOB/AGE: August 15, 1945 66 y.o.  Admit date: 06/02/2012 Discharge date: 06-03-2012 Date of Surgery: 06/02/2012 Surgeon: Surgeon(s): Serafina Mitchell, MD  Admission Diagnosis: LEFT ICA STENOSIS  Discharge Diagnoses:  LEFT ICA STENOSIS  Secondary Diagnoses: Past Medical History  Diagnosis Date  . Arthritis   . Hyperlipidemia     takes Lipitor daily  . H/O: GI bleed   . Carotid artery occlusion   . H/O renal artery stenosis   . DVT (deep venous thrombosis) 2008  . Hypertension     takes Metoprolol and Amlodipine daily  . Joint swelling   . Joint pain   . Gout     takes Allopurinol daily and Indomethacin prn  . GERD (gastroesophageal reflux disease)     takes Protonix daily  . History of gastric ulcer   . History of colon polyps   . History of blood transfusion     Procedure(s): ENDARTERECTOMY CAROTID PATCH ANGIOPLASTY  Discharged Condition: good  HPI:  James [name] is a 66 y.o. male was seen for evaluation of his left carotid stenosis. He is well known to me from having undergone right carotid endarterectomy and September of 2008 as well as a right renal stent in July 2009. He has been unable to take Plavix secondary to a GI bleed. He has had progression of the stenosis within his left carotid artery is now progressed to greater than 80%. He comes in to have this further evaluated. He denies symptoms. Specifically, he denies numbness or weakness in either extremity. He denies slurred speech. He denies amaurosis fugax.   Hospital Course:  James [name] is a 66 y.o. male is S/P Left Procedure(s): ENDARTERECTOMY CAROTID PATCH ANGIOPLASTY Extubated: POD # 0 Post-op wounds healing well Pt. Ambulating, voiding and taking PO diet without difficulty. Pt pain controlled with PO pain meds. Neuro exam is sensation intact smile is symetrical and no facial droop is  noted. Labs as below Complications:none  Consults:     Significant Diagnostic Studies: CBC Lab Results  Component Value Date   WBC 8.9 05/23/2012   HGB 14.6 05/23/2012   HCT 42.0 05/23/2012   MCV 80.9 05/23/2012   PLT 217 05/23/2012    BMET    Component Value Date/Time   NA 141 05/23/2012 1533   K 3.9 05/23/2012 1533   CL 103 05/23/2012 1533   CO2 30 05/23/2012 1533   GLUCOSE 97 05/23/2012 1533   BUN 13 05/23/2012 1533   CREATININE 1.17 05/23/2012 1533   CALCIUM 9.4 05/23/2012 1533   GFRNONAA 63* 05/23/2012 1533   GFRAA 73* 05/23/2012 1533   COAG Lab Results  Component Value Date   INR 0.93 05/23/2012   INR 1.0 04/19/2007     Disposition:  Discharge to :Home Discharge Orders    Future Appointments: Provider: Department: Dept Phone: Center:   10/17/2012 9:00 AM [facility] [phone] [facility]   10/17/2012 10:00 AM [facility] - [phone] [facility]   10/17/2012 11:00 AM Serafina Mitchell, MD [facility] [phone] [facility]     Future Orders Please Complete By Expires   Resume previous diet      Driving Restrictions      Comments:   No driving for 2 weeks   Lifting restrictions      Comments:   No lifting for 4 weeks  Call MD for:  temperature >100.5      Call MD for:  redness, tenderness, or signs of infection (pain, swelling, bleeding, redness, odor or green/yellow discharge around incision site)      Call MD for:  severe or increased pain, loss or decreased feeling  in affected limb(s)      Increase activity slowly      Comments:   Walk with assistance use walker or cane as needed   May shower       Scheduling Instructions:   Saturday   No dressing needed      may wash over wound with mild soap and water      CAROTID Sugery: Call MD for difficulty swallowing or speaking; weakness in arms or legs that is a new symtom; severe headache.  If you have  increased swelling in the neck and/or  are having difficulty breathing, CALL 911         Zappone, Nir Delage  Home Medication Instructions [id number]   Printed on:06/02/12 1015  Medication Information                    pantoprazole (PROTONIX) 40 MG tablet Take 40 mg by mouth daily.           metoprolol (LOPRESSOR) 50 MG tablet Take 50 mg by mouth daily.           amLODipine (NORVASC) 10 MG tablet Take 10 mg by mouth daily.           aspirin 81 MG tablet Take 81 mg by mouth 2 (two) times daily.            allopurinol (ZYLOPRIM) 100 MG tablet Take 100 mg by mouth daily.           indomethacin (INDOCIN) 50 MG capsule Take 50 mg by mouth 2 (two) times daily as needed. For gout flare           simvastatin (ZOCOR) 20 MG tablet Take 20 mg by mouth at bedtime.           oxyCODONE-acetaminophen (ROXICET) 5-325 MG per tablet Take 1 tablet by mouth every 4 (four) hours as needed for pain.            Verbal and written Discharge instructions given to the patient. Wound care per Discharge AVS Follow-up Information    Follow up with Trula Slade IV, [place], MD. In 2 weeks. (office will arrange - sent)    Contact information:   [place] [address] [phone]        D/C after patient voids independently.  SignedRichrd Prime 06/02/2012, 10:15 AM

## 2012-06-02 NOTE — Progress Notes (Signed)
Utilization review completed.  

## 2012-06-02 NOTE — Progress Notes (Signed)
Foley catheter 16 fr. Inserted per MD order.  Inserted per policy and procedure.  Pt. Tolerated well.  Urine immediately produced.  Pt. States relief.

## 2012-06-02 NOTE — Anesthesia Preprocedure Evaluation (Addendum)
Anesthesia Evaluation  Patient identified by MRN, date of birth, ID band Patient awake    Reviewed: Allergy & Precautions, H&P , NPO status , Patient's Chart, lab work & pertinent test results, reviewed documented beta blocker date and time   Airway Mallampati: II TM Distance: >3 FB Neck ROM: full    Dental  (+)    Pulmonary neg pulmonary ROS, former smoker,  --------------------------------------------------------------------  Study Conclusions   - Left ventricle: Septal apical and inferoapical akinesis The cavity    size was mildly dilated. Wall thickness was increased in a pattern    of mild LVH. Systolic function was mildly to moderately reduced.    The estimated ejection fraction was in the range of 40% to 45%.  - Aortic valve: Mild regurgitation.  - Left atrium: The atrium was mildly dilated.  - Atrial septum: No defect or patent foramen ovale was identified.  - Pulmonary arteries: PA peak pressure: 21mm Hg (S).  Transthoracic echocardiography. M-mode, complete 2D, spectral  Doppler, and color Doppler. Height: Height: 160cm. Height: 63in.  Weight: Weight: 86.7kg. Weight: 190.7lb. Body mass index: BMI:  33.9kg/m^2. Body surface area: BSA: 1.1m^2. Blood pressure: 109/72.  Patient status: Inpatient. Location: Echo laboratory. 05-23-12 breath sounds clear to auscultation        Cardiovascular hypertension, On Medications and On Home Beta Blockers + Peripheral Vascular Disease Rhythm:regular Rate:Normal + Carotid Bruit    Neuro/Psych negative psych ROS   GI/Hepatic Neg liver ROS, GERD-  Medicated and Controlled,  Endo/Other  negative endocrine ROS  Renal/GU negative Renal ROS  negative genitourinary   Musculoskeletal  (+) Arthritis -, Osteoarthritis,    Abdominal   Peds  Hematology negative hematology ROS (+)   Anesthesia Other Findings See surgeon's H&P   Reproductive/Obstetrics negative OB ROS                         Anesthesia Physical Anesthesia Plan  ASA: III  Anesthesia Plan: General   Post-op Pain Management:    Induction: Intravenous  Airway Management Planned: Oral ETT  Additional Equipment: Arterial line  Intra-op Plan:   Post-operative Plan: Extubation in OR  Informed Consent: I have reviewed the patients History and Physical, chart, labs and discussed the procedure including the risks, benefits and alternatives for the proposed anesthesia with the patient or authorized representative who has indicated his/her understanding and acceptance.   Dental Advisory Given and Dental advisory given  Plan Discussed with: CRNA and Surgeon  Anesthesia Plan Comments:        Anesthesia Quick Evaluation

## 2012-06-02 NOTE — Anesthesia Procedure Notes (Signed)
Procedure Name: Intubation Date/Time: 06/02/2012 7:52 AM Performed by: Wanita Chamberlain Pre-anesthesia Checklist: Timeout performed, Patient identified, Emergency Drugs available, Suction available and Patient being monitored Patient Re-evaluated:Patient Re-evaluated prior to inductionOxygen Delivery Method: Circle system utilized Preoxygenation: Pre-oxygenation with 100% oxygen Intubation Type: IV induction Ventilation: Mask ventilation without difficulty Laryngoscope Size: Mac and 3 Grade View: Grade III Tube type: Oral Tube size: 7.5 mm Number of attempts: 2 Airway Equipment and Method: Stylet Placement Confirmation: ETT inserted through vocal cords under direct vision,  positive ETCO2,  CO2 detector and breath sounds checked- equal and bilateral Secured at: 23 cm Tube secured with: Tape Dental Injury: Teeth and Oropharynx as per pre-operative assessment  Difficulty Due To: Difficult Airway- due to anterior larynx

## 2012-06-02 NOTE — Progress Notes (Signed)
Pt arrived from PACU, VSS, neuro intact, oriented to unit and routine, call bell within reach, family at bedside, will continue to monitor.

## 2012-06-02 NOTE — Op Note (Signed)
[facility]  Patient name: James [name] MRN: [id number] DOB: 16-Sep-1945 Sex: male  06/02/2012 Pre-operative Diagnosis: Asymptomatic  left carotid stenosis Post-operative diagnosis:  Same Surgeon:  Eldridge Abrahams Assistants:  Wray Kearns, P.A. Procedure:    left carotid Endarterectomy with bovine pericardial patch angioplasty Anesthesia:  General Blood Loss:  See anesthesia record Specimens:  Carotid Plaque to pathology  Findings:  85 %stenosis; Thrombus:  none  Indications:  66 yo s/p right CEA who I have been following for left carotid stenosis.  His last u/s revealed an increase to greater than 80%.  He remains asymptomatic.  He was seen by ENT and found to have normal vocal cord movement.  Procedure:  The patient was identified in the holding area and taken to [place] [facility]  The patient was then placed supine on the table.   General endotrachial anesthesia was administered.  The patient was prepped and draped in the usual sterile fashion.  A time out was called and antibiotics were administered.  The incision was made along the anterior border of the left sternocleidomastoid muscle.  Cautery was used to dissect through the subcutaneous tissue.  The platysma muscle was divided with cautery.  The internal jugular vein was exposed along its anterior medial border.  The common facial vein was exposed and then divided between 2-0 silk ties and metal clips.  The common carotid artery was then circumferentially exposed and encircled with an umbilical tape.  The vagus nerve was identified and protected.  Next sharp dissection was used to expose the external carotid artery and the superior thyroid artery.  The were encircled with a blue vessel loop and a 2-0 silk tie respectively.  Finally, the internal carotid was carefully dissected free.  An umbilical tape was placed around the internal carotid artery distal to the diseased segment.  The hypoglossal nerve  was visualized throughout and protected.  The patient was given systemic heparinization.  A bovine carotid patch was selected and prepared on the back table.  A 10 french shunt was also prepared.  After blood pressure readings were appropriate and the heparin had been given time to circulate, the internal carotid artery was occluded with a baby Gregory clamp.  The external and common carotid arteries were then occluded with vascular clamps and the 2-0 tie tightened on the superior thyroid artery.  A #11 blade was used to make an arteriotomy in the common carotid artery.  This was extended with Potts scissors along the anterior and lateral border of the common and internal carotid artery.  Approximately 85% stenosis was identified.  There was no thrombus identified. A shunt was not placed due to how high the lesion was  And due to the hypoglossal nerve being in the way.  There was pulsatile back-bleeding.  A kleiner kuntz elevator was used to perform endarterectomy.  An eversion endarterectomy was performed in the external carotid artery.  A good distal endpoint was obtained in the internal carotid artery.  The specimen was removed and sent to pathology.  Heparinized saline was used to irrigate the endarterectomized field.  All potential embolic debris was removed.  Bovine pericardial patch angioplasty was then performed using a running 6-0 Prolene. The common internal and external carotid arteries were all appropriately flushed. The artery was again irrigated with heparin saline.  The anastomosis was then secured. The clamp was first released on the external carotid artery followed by the common carotid artery approximately 30 seconds  later, bloodflow was reestablish through the internal carotid artery.  Next, a hand-held  Doppler was used to evaluate the signals in the common, external, and internal  carotid arteries, all of which had appropriate signals. I then administered  50 mg protamine. The wound was then  irrigated.  After hemostasis was achieved, the carotid sheath was reapproximated with 3-0 Vicryl. The  platysma muscle was reapproximated with running 3-0 Vicryl. The skin  was closed with 4-0 Vicryl. Dermabond was placed on the skin. The  patient was then successfully extubated. His neurologic exam was  similar to his preprocedural exam. The patient was then taken to recovery room  in stable condition. There were no complications.     Disposition:  To PACU in stable condition.  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  I did not use a shunt due to the high bifurcation and lesion as well as the hypoglossal nerve was in the way.  He had pulsatile backbleeding.  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  Theotis Burrow, M.D. [facility] [place] Office: [phone] Pager:  [phone]

## 2012-06-02 NOTE — H&P (View-Only) (Signed)
[facility] [place]   Patient name: James [name] MRN: [id number] DOB: 11/12/1945 Sex: male     Chief Complaint  Patient presents with  . Carotid    discuss results of V lab from 04/13/2012    HISTORY OF PRESENT ILLNESS: The patient comes in today for evaluation of his left carotid stenosis. He is well known to me from having undergone right carotid endarterectomy and September of 2008 as well as a right renal stent in July 2009. He has been unable to take Plavix secondary to a GI bleed. He has had progression of the stenosis within his left carotid artery is now progressed to greater than 80%.  He comes in to have this further evaluated. He denies symptoms. Specifically, he denies numbness or weakness in either extremity. He denies slurred speech. He denies amaurosis fugax.  The patient continues to be medically managed for his hypertension and hyperlipidemia. He has had a GI bleed which required small bowel resection.  Past Medical History  Diagnosis Date  . Hypertension   . Arthritis   . Hyperlipidemia   . H/O: GI bleed   . Carotid artery occlusion   . H/O renal artery stenosis   . DVT (deep venous thrombosis) 2008    Past Surgical History  Procedure Date  . Renal artery stent 2009    Right renal artery stenting  . Carotid endarterectomy 2008    right CEA    History   Social History  . Marital Status: Married    Spouse Name: N/A    Number of Children: N/A  . Years of Education: N/A   Occupational History  . Not on file.   Social History Main Topics  . Smoking status: Former Research scientist (life sciences)  . Smokeless tobacco: Former Systems developer    Quit date: 12/26/1999  . Alcohol Use: No  . Drug Use: No  . Sexually Active: Not on file   Other Topics Concern  . Not on file   Social History Narrative  . No narrative on file    Family History  Problem Relation Age of Onset  . Hypertension Mother   . Hyperlipidemia Father   . Hypertension Father   .  Hypertension Sister   . Hypertension Brother     Allergies as of 05/09/2012  . (No Known Allergies)    Current Outpatient Prescriptions on File Prior to Visit  Medication Sig Dispense Refill  . allopurinol (ZYLOPRIM) 100 MG tablet Take 100 mg by mouth daily.      Marland Kitchen amLODipine (NORVASC) 10 MG tablet Take 10 mg by mouth daily.      Marland Kitchen aspirin 81 MG tablet Take 81 mg by mouth daily.      Marland Kitchen atorvastatin (LIPITOR) 40 MG tablet Take 40 mg by mouth daily.      . fish oil-omega-3 fatty acids 1000 MG capsule Take 1 g by mouth daily.      . metoprolol (LOPRESSOR) 50 MG tablet Take 50 mg by mouth daily.      . pantoprazole (PROTONIX) 40 MG tablet Take 40 mg by mouth daily.      . simvastatin (ZOCOR) 20 MG tablet Take 20 mg by mouth at bedtime.         REVIEW OF SYSTEMS: Cardiovascular: No chest pain, chest pressure, palpitations, orthopnea, or dyspnea on exertion. No claudication or rest pain,  Pulmonary: No productive cough, asthma or wheezing. Neurologic: No weakness, paresthesias, aphasia, or amaurosis. No dizziness. Hematologic: History of GI bleeding,  now resolved after small bowel resection Musculoskeletal: No joint pain or joint swelling. Gastrointestinal: No blood in stool or hematemesis current Genitourinary: No dysuria or hematuria. Psychiatric:: No history of major depression. Integumentary: No rashes or ulcers. Constitutional: No fever or chills.  PHYSICAL EXAMINATION:   Vital signs are BP 138/70  Pulse 65  Ht 5\' 10"  (1.778 m)  Wt 202 lb (91.627 kg)  BMI 28.98 kg/m2  SpO2 100% General: The patient appears their stated age. HEENT:  No gross abnormalities Pulmonary:  Non labored breathing Abdomen: Soft and non-tender Musculoskeletal: There are no major deformities. Neurologic: No focal weakness or paresthesias are detected, Skin: There are no ulcer or rashes noted. Psychiatric: The patient has normal affect. Cardiovascular: There is a regular rate and rhythm without  significant murmur appreciated. No abdominal bruit. No carotid bruit   Diagnostic Studies Duplex ultrasound has been reviewed. This shows greater than 80% left carotid stenosis. He has a widely patent right endarterectomy site. The patient has normal carotid past the stenosis. His bifurcation his mid hyoid.  Assessment: Asymptomatic left carotid stenosis. Plan: The patient has been scheduled for left carotid endarterectomy to be performed on Thursday, November 7. The risks and benefits have been discussed with the patient, including the risk of nerve injury, the risk of stroke, the risk of cardiopulmonary complications, and the risk of infection. All of his questions were answered. Because he has previously undergone right carotid endarterectomy I will sent into the ENTto have his vocal cords evaluated prior to his operation. I have told him not to stop his aspirin  V. Leia Alf, M.D. [facility] [place] Office: [phone] Pager:  [phone]

## 2012-06-02 NOTE — Preoperative (Signed)
Beta Blockers   Took Lopressor @ 4am 06-02-12

## 2012-06-02 NOTE — Interval H&P Note (Signed)
History and Physical Interval Note:  06/02/2012 7:29 AM  James [name]  has presented today for surgery, with the diagnosis of LEFT ICA STENOSIS  The various methods of treatment have been discussed with the patient and family. After consideration of risks, benefits and other options for treatment, the patient has consented to  Procedure(s) (LRB) with comments: ENDARTERECTOMY CAROTID (Left) as a surgical intervention .  The patient's history has been reviewed, patient examined, no change in status, stable for surgery.  I have reviewed the patient's chart and labs.  Questions were answered to the patient's satisfaction.     BRABHAM IV, V. WELLS

## 2012-06-02 NOTE — Anesthesia Postprocedure Evaluation (Signed)
Anesthesia Post Note  Patient: James [name]  Procedure(s) Performed: Procedure(s) (LRB): ENDARTERECTOMY CAROTID (Left) PATCH ANGIOPLASTY (Left)  Anesthesia type: general  Patient location: PACU  Post pain: Pain level controlled  Post assessment: Patient's Cardiovascular Status Stable  Last Vitals:  Filed Vitals:   06/02/12 1145  BP:   Pulse: 75  Temp:   Resp: 13    Post vital signs: Reviewed and stable  Level of consciousness: sedated  Complications: No apparent anesthesia complications

## 2012-06-03 ENCOUNTER — Encounter (HOSPITAL_COMMUNITY): Payer: Self-pay | Admitting: Surgery

## 2012-06-03 ENCOUNTER — Telehealth: Payer: Self-pay | Admitting: Surgery

## 2012-06-03 LAB — BASIC METABOLIC PANEL
BUN: 9 mg/dL (ref 6–23)
CO2: 27 mEq/L (ref 19–32)
Calcium: 8.8 mg/dL (ref 8.4–10.5)
Chloride: 105 mEq/L (ref 96–112)
Creatinine, Ser: 0.95 mg/dL (ref 0.50–1.35)
GFR calc Af Amer: >90 mL/min (ref >90)
GFR calc non Af Amer: 85 mL/min — ABNORMAL LOW (ref >90)
Glucose, Bld: 111 mg/dL — ABNORMAL HIGH (ref 70–99)
Potassium: 3.8 mEq/L (ref 3.5–5.1)
Sodium: 141 mEq/L (ref 135–145)

## 2012-06-03 LAB — CBC
HCT: 37.2 % — ABNORMAL LOW (ref 39.0–52.0)
Hemoglobin: 12.7 g/dL — ABNORMAL LOW (ref 13.0–17.0)
MCH: 27.5 pg (ref 26.0–34.0)
MCHC: 34.1 g/dL (ref 30.0–36.0)
MCV: 80.7 fL (ref 78.0–100.0)
Platelets: 178 10*3/uL (ref 150–400)
RBC: 4.61 MIL/uL (ref 4.22–5.81)
RDW: 13.6 % (ref 11.5–15.5)
WBC: 9.9 10*3/uL (ref 4.0–10.5)

## 2012-06-03 MED ORDER — TAMSULOSIN HCL 0.4 MG PO CAPS
0.4000 mg | ORAL_CAPSULE | Freq: Once | ORAL | Status: AC
  Administered 2012-06-03: 0.4 mg via ORAL
  Filled 2012-06-03: qty 1

## 2012-06-03 NOTE — Progress Notes (Signed)
Pt d/c home per MD order, pt VSS, d/c instructions and prescription given, family at [facility] questions answered

## 2012-06-03 NOTE — Telephone Encounter (Addendum)
Message copied by Lujean Amel on Fri Jun 03, 2012 11:10 AM ------      Message from: Alfonso Patten      Created: Fri Jun 03, 2012 10:58 AM       HERE IT IS      ----- Message -----         From: Richrd Prime, PA         Sent: 06/02/2012  10:11 AM           To: Alfonso Patten, RN            2 week F/U - brabham - CEA      I scheduled an appt for the above pt on 06/20/12 at 8:45am. I left a msg for the pt regarding this appt and I also mailed a letter.awt

## 2012-06-03 NOTE — Progress Notes (Addendum)
VASCULAR AND VEIN SURGERY POST - OP CEA PROGRESS NOTE  Date of Surgery: 06/02/2012 Surgeon: Surgeon(s): Serafina Mitchell, MD 1 Day Post-Op left Carotid Endarterectomy .  HPI: James [name] is a 66 y.o. male who is 1 Day Post-Op left Carotid Endarterectomy . Patient is doing well. Pre-operative symptoms are Improved Patient denies headache; Patient denies difficulty swallowing; denies weakness in upper or lower extremities; Pt. denies other symptoms of stroke or TIA.  IMAGING: No results found.  Significant Diagnostic Studies: CBC Lab Results  Component Value Date   WBC 9.9 06/03/2012   HGB 12.7* 06/03/2012   HCT 37.2* 06/03/2012   MCV 80.7 06/03/2012   PLT 178 06/03/2012    BMET    Component Value Date/Time   NA 141 06/03/2012 0430   K 3.8 06/03/2012 0430   CL 105 06/03/2012 0430   CO2 27 06/03/2012 0430   GLUCOSE 111* 06/03/2012 0430   BUN 9 06/03/2012 0430   CREATININE 0.95 06/03/2012 0430   CALCIUM 8.8 06/03/2012 0430   GFRNONAA 85* 06/03/2012 0430   GFRAA >90 06/03/2012 0430    COAG Lab Results  Component Value Date   INR 0.93 05/23/2012   INR 1.0 04/19/2007   No results found for this basename: PTT      Intake/Output Summary (Last 24 hours) at 06/03/12 0745 Last data filed at 06/03/12 [id number]  Gross per 24 hour  Intake   3620 ml  Output   4300 ml  Net   -680 ml    Physical Exam:  BP Readings from Last 3 Encounters:  06/03/12 165/70  06/03/12 165/70  05/23/12 146/75   Temp Readings from Last 3 Encounters:  06/03/12 98.2 F (36.8 C) Oral  06/03/12 98.2 F (36.8 C) Oral  05/23/12 98.6 F (37 C)    SpO2 Readings from Last 3 Encounters:  06/03/12 97%  06/03/12 97%  05/23/12 96%   Pulse Readings from Last 3 Encounters:  06/03/12 79  06/03/12 79  05/23/12 65    Pt is A&O x 3 Gait is normal Speech is fluent left Neck Wound is healing well Patient with Negative tongue deviation and Negative facial droop Pt has good and equal strength in all  extremities  Assessment: James [name] is a 66 y.o. male is S/P Left Carotid endarterectomy Pt is voiding, ambulating and taking po well   Plan: Discharge to: Home Follow-up in 2 weeks Patient has not independently urinated yet.  We will hold D/C until he voids.   Laurence Slate [facility] [id number] 06/03/2012 7:45 AM  Neuro intact Incision looks fine For D/C today.  Judeth Cornfield. Scot Dock, [place], [place] [phone] 06/03/2012

## 2012-06-05 NOTE — Discharge Summary (Signed)
I agree with the above  James [name] 

## 2012-06-17 ENCOUNTER — Encounter: Payer: Self-pay | Admitting: Surgery

## 2012-06-20 ENCOUNTER — Encounter: Payer: Self-pay | Admitting: Surgery

## 2012-06-20 ENCOUNTER — Ambulatory Visit (INDEPENDENT_AMBULATORY_CARE_PROVIDER_SITE_OTHER): Payer: Medicare Other | Admitting: Surgery

## 2012-06-20 VITALS — BP 133/71 | HR 72 | Ht 70.0 in | Wt 197.6 lb

## 2012-06-20 DIAGNOSIS — I6529 Occlusion and stenosis of unspecified carotid artery: Secondary | ICD-10-CM

## 2012-06-20 NOTE — Progress Notes (Signed)
The patient is back today for followup. He is status post left carotid endarterectomy on November 7. This was done in the setting of a high-grade asymptomatic stenosis. His postoperative course was uncomplicated. He has no complaints today.  On examination: His incision is well-healed. He is neurologically intact.  Overall he is doing very well at this point. I will have him return in 6 months for a carotid ultrasound and a renal duplex to evaluate the previously placed renal stent.

## 2012-07-18 DIAGNOSIS — M545 Low back pain, unspecified: Secondary | ICD-10-CM | POA: Diagnosis not present

## 2012-07-18 DIAGNOSIS — IMO0002 Reserved for concepts with insufficient information to code with codable children: Secondary | ICD-10-CM | POA: Diagnosis not present

## 2012-08-12 ENCOUNTER — Other Ambulatory Visit: Payer: Self-pay | Admitting: *Deleted

## 2012-08-12 DIAGNOSIS — Z48812 Encounter for surgical aftercare following surgery on the circulatory system: Secondary | ICD-10-CM

## 2012-08-12 DIAGNOSIS — I701 Atherosclerosis of renal artery: Secondary | ICD-10-CM

## 2012-08-12 DIAGNOSIS — I6529 Occlusion and stenosis of unspecified carotid artery: Secondary | ICD-10-CM

## 2012-10-06 ENCOUNTER — Encounter: Payer: Self-pay | Admitting: *Deleted

## 2012-10-11 ENCOUNTER — Ambulatory Visit
Admission: RE | Admit: 2012-10-11 | Discharge: 2012-10-11 | Disposition: A | Discharge status: Home / Self Care | Payer: Self-pay | Source: Ambulatory Visit | Attending: Physician Assistant | Admitting: Physician Assistant

## 2012-10-11 ENCOUNTER — Encounter: Payer: Self-pay | Admitting: Physician Assistant

## 2012-10-11 ENCOUNTER — Ambulatory Visit (INDEPENDENT_AMBULATORY_CARE_PROVIDER_SITE_OTHER): Payer: Medicare Other

## 2012-10-11 ENCOUNTER — Ambulatory Visit (INDEPENDENT_AMBULATORY_CARE_PROVIDER_SITE_OTHER): Payer: Medicare Other | Admitting: Physician Assistant

## 2012-10-11 VITALS — BP 169/82 | HR 96 | Temp 98.1°F | Ht 69.0 in | Wt 204.2 lb

## 2012-10-11 DIAGNOSIS — Z87891 Personal history of nicotine dependence: Secondary | ICD-10-CM

## 2012-10-11 DIAGNOSIS — R7989 Other specified abnormal findings of blood chemistry: Secondary | ICD-10-CM

## 2012-10-11 DIAGNOSIS — R059 Cough, unspecified: Secondary | ICD-10-CM | POA: Diagnosis not present

## 2012-10-11 DIAGNOSIS — Z0000 Encounter for general adult medical examination without abnormal findings: Secondary | ICD-10-CM

## 2012-10-11 DIAGNOSIS — R05 Cough: Secondary | ICD-10-CM

## 2012-10-11 DIAGNOSIS — E785 Hyperlipidemia, unspecified: Secondary | ICD-10-CM

## 2012-10-11 DIAGNOSIS — M109 Gout, unspecified: Secondary | ICD-10-CM | POA: Insufficient documentation

## 2012-10-11 DIAGNOSIS — I10 Essential (primary) hypertension: Secondary | ICD-10-CM | POA: Diagnosis not present

## 2012-10-11 LAB — POCT URINALYSIS DIPSTICK
Bilirubin, UA: NEGATIVE
Glucose, UA: NEGATIVE
Ketones, UA: NEGATIVE
Nitrite, UA: NEGATIVE
Protein, UA: NEGATIVE
Spec Grav, UA: 1.015
Urobilinogen, UA: NEGATIVE
pH, UA: 6.5

## 2012-10-11 LAB — POCT UA - MICROSCOPIC ONLY
Bacteria, U Microscopic: NEGATIVE
Casts, Ur, LPF, POC: NEGATIVE
Crystals, Ur, HPF, POC: NEGATIVE
Mucus, UA: NEGATIVE
Yeast, UA: NEGATIVE

## 2012-10-11 LAB — POCT CBC
Granulocyte percent: 75.9 %G (ref 37–80)
HCT, POC: 44.5 % (ref 43.5–53.7)
Hemoglobin: 14.6 g/dL (ref 14.1–18.1)
Lymph, poc: 1.6 (ref 0.6–3.4)
MCH, POC: 27.2 pg (ref 27–31.2)
MCHC: 32.9 g/dL (ref 31.8–35.4)
MCV: 82.6 fL (ref 80–97)
MPV: 8.5 fL (ref 0–99.8)
POC Granulocyte: 7 — AB (ref 2–6.9)
POC LYMPH PERCENT: 17.1 %L (ref 10–50)
Platelet Count, POC: 221 10*3/uL (ref 142–424)
RBC: 5.4 M/uL (ref 4.69–6.13)
RDW, POC: 14.2 %
WBC: 9.2 10*3/uL (ref 4.6–10.2)

## 2012-10-11 NOTE — Progress Notes (Signed)
  Subjective:    Patient ID: James [name], male    DOB: 05/07/1946, 67 y.o.   MRN: [id number]  HPI Annual exam.    Review of Systems  Respiratory: Positive for cough.   All other systems reviewed and are negative.       Objective:   Physical Exam  Vitals reviewed. Constitutional: He is oriented to person, place, and time. He appears well-developed and well-nourished.  HENT:  Head: Normocephalic and atraumatic.  Right Ear: External ear normal.  Left Ear: External ear normal.  Mouth/Throat: Oropharynx is clear and moist.  Eyes: Conjunctivae and EOM are normal. Pupils are equal, round, and reactive to light.  Neck: Normal range of motion. Neck supple.  Bilateral carotid repair scars  Cardiovascular: Normal rate, regular rhythm, normal heart sounds and intact distal pulses.   Pulmonary/Chest: Effort normal. No respiratory distress. He has no rales. He exhibits no tenderness.  CXR - within normal limits  Musculoskeletal: Normal range of motion.  Neurological: He is alert and oriented to person, place, and time. He has normal reflexes.  Skin: Skin is warm and dry.          Assessment & Plan:  Complete Physical Exam Hypertension Hyperlipidemia Gout  Orders Placed This Encounter  Procedures  . DG Chest 2 View    Standing Status: Future     Number of Occurrences: 1     Standing Expiration Date: 12/11/2013    Order Specific Question:  Reason for Exam (SYMPTOM  OR DIAGNOSIS REQUIRED)    Answer:  prior smoker    Order Specific Question:  Preferred imaging location?    Answer:  Internal  . DG Chest 2 View    Standing Status: Future     Number of Occurrences: 1     Standing Expiration Date: 12/11/2013    Order Specific Question:  Reason for Exam (SYMPTOM  OR DIAGNOSIS REQUIRED)    Answer:  previous smoker    Order Specific Question:  Preferred imaging location?    Answer:  Internal  . Comprehensive metabolic panel  . Lipid panel  . Uric Acid  . POCT CBC  . POCT  urinalysis dipstick  . POCT UA - Microscopic Only

## 2012-10-12 ENCOUNTER — Telehealth: Payer: Self-pay | Admitting: *Deleted

## 2012-10-12 LAB — COMPREHENSIVE METABOLIC PANEL
ALT: 24 IU/L (ref 0–44)
AST: 24 IU/L (ref 0–40)
Albumin/Globulin Ratio: 1.4 (ref 1.1–2.5)
Albumin: 4.2 g/dL (ref 3.6–4.8)
Alkaline Phosphatase: 174 IU/L — ABNORMAL HIGH (ref 39–117)
BUN/Creatinine Ratio: 9 — ABNORMAL LOW (ref 10–22)
BUN: 10 mg/dL (ref 8–27)
CO2: 25 mmol/L (ref 19–28)
Calcium: 9.9 mg/dL (ref 8.6–10.2)
Chloride: 102 mmol/L (ref 97–108)
Creatinine, Ser: 1.17 mg/dL (ref 0.76–1.27)
GFR calc Af Amer: 74 mL/min/{1.73_m2} (ref >59)
GFR calc non Af Amer: 64 mL/min/{1.73_m2} (ref >59)
Globulin, Total: 3.1 g/dL (ref 1.5–4.5)
Glucose: 109 mg/dL — ABNORMAL HIGH (ref 65–99)
Potassium: 4.1 mmol/L (ref 3.5–5.2)
Sodium: 144 mmol/L (ref 134–144)
Total Bilirubin: 0.8 mg/dL (ref 0.0–1.2)
Total Protein: 7.3 g/dL (ref 6.0–8.5)

## 2012-10-12 LAB — LIPID PANEL
Chol/HDL Ratio: 3.2 ratio units (ref 0.0–5.0)
Cholesterol, Total: 172 mg/dL (ref 100–199)
HDL: 53 mg/dL (ref >39)
LDL Calculated: 90 mg/dL (ref 0–99)
Triglycerides: 146 mg/dL (ref 0–149)
VLDL Cholesterol Cal: 29 mg/dL (ref 5–40)

## 2012-10-12 LAB — URIC ACID: Uric Acid: 7.7 mg/dL (ref 3.7–8.6)

## 2012-10-12 LAB — POCT GLYCOSYLATED HEMOGLOBIN (HGB A1C): Hemoglobin A1C: 5.8

## 2012-10-12 NOTE — Telephone Encounter (Signed)
Pt informed chest x-ray results were normal.

## 2012-10-12 NOTE — Progress Notes (Signed)
Quick Note:  Labs were within normal limits ______ 

## 2012-10-12 NOTE — Addendum Note (Signed)
Addended by: Pollyann Kennedy F on: 10/12/2012 03:42 PM   Modules accepted: Orders

## 2012-10-12 NOTE — Progress Notes (Signed)
Pt given lab results and copy of labs up front for him to come get.

## 2012-10-12 NOTE — Telephone Encounter (Signed)
Message copied by Shelbie Ammons on Wed Oct 12, 2012  9:55 AM ------      Message from: Gearlean Alf      Created: Wed Oct 12, 2012  8:09 AM       Normal chest x-ray with mild hyperinflation of previous smoking ------

## 2012-10-17 ENCOUNTER — Other Ambulatory Visit: Payer: Medicare Other

## 2012-10-17 ENCOUNTER — Ambulatory Visit: Payer: Medicare Other | Admitting: Surgery

## 2012-11-29 ENCOUNTER — Other Ambulatory Visit: Payer: Self-pay

## 2012-11-29 MED ORDER — AMLODIPINE BESYLATE 10 MG PO TABS: 10.0000 mg | ORAL_TABLET | Freq: Every day | ORAL | Status: DC

## 2012-12-02 ENCOUNTER — Other Ambulatory Visit: Payer: Self-pay | Admitting: Nurse Practitioner

## 2012-12-02 ENCOUNTER — Other Ambulatory Visit: Payer: Self-pay | Admitting: Family Medicine

## 2012-12-05 ENCOUNTER — Other Ambulatory Visit: Payer: Self-pay

## 2012-12-05 MED ORDER — ALLOPURINOL 100 MG PO TABS: 100.0000 mg | ORAL_TABLET | Freq: Every day | ORAL | Status: DC

## 2012-12-07 ENCOUNTER — Other Ambulatory Visit: Payer: Self-pay | Admitting: Family Medicine

## 2012-12-23 ENCOUNTER — Encounter: Payer: Self-pay | Admitting: Surgery

## 2012-12-26 ENCOUNTER — Other Ambulatory Visit: Payer: Self-pay | Admitting: *Deleted

## 2012-12-26 ENCOUNTER — Other Ambulatory Visit (INDEPENDENT_AMBULATORY_CARE_PROVIDER_SITE_OTHER): Payer: Medicare Other | Admitting: *Deleted

## 2012-12-26 ENCOUNTER — Encounter: Payer: Self-pay | Admitting: Surgery

## 2012-12-26 ENCOUNTER — Ambulatory Visit (INDEPENDENT_AMBULATORY_CARE_PROVIDER_SITE_OTHER): Payer: Medicare Other | Admitting: Surgery

## 2012-12-26 DIAGNOSIS — Z48812 Encounter for surgical aftercare following surgery on the circulatory system: Secondary | ICD-10-CM

## 2012-12-26 DIAGNOSIS — I6529 Occlusion and stenosis of unspecified carotid artery: Secondary | ICD-10-CM | POA: Diagnosis not present

## 2012-12-26 DIAGNOSIS — I701 Atherosclerosis of renal artery: Secondary | ICD-10-CM

## 2012-12-26 NOTE — Progress Notes (Signed)
[facility] [place]   Patient name: James [name] MRN: [id number] DOB: 04/27/1946 Sex: male     Chief Complaint  Patient presents with  . Re-evaluation    1 year f/u and renal duplex    HISTORY OF PRESENT ILLNESS: The patient is back today for followup. He is status post left carotid endarterectomy for asymptomatic stenosis in November of 2013. He is status post right carotid endarterectomy in 2008. He has also undergone a right renal artery stenting. He reports no neurologic problems. He states that his blood pressure has been well controlled.  Past Medical History  Diagnosis Date  . Arthritis   . Hyperlipidemia     takes Lipitor daily  . H/O: GI bleed   . Carotid artery occlusion   . H/O renal artery stenosis   . DVT (deep venous thrombosis) 2008  . Hypertension     takes Metoprolol and Amlodipine daily  . Joint swelling   . Joint pain   . Gout     takes Allopurinol daily and Indomethacin prn  . GERD (gastroesophageal reflux disease)     takes Protonix daily  . History of gastric ulcer   . History of colon polyps   . History of blood transfusion   . Gout flare   . DVT (deep venous thrombosis)     Past Surgical History  Procedure Laterality Date  . Renal artery stent  2009    Right renal artery stenting  . Carotid endarterectomy  2008    right CEA  . Small intestine removed  2001    d/t random gi bleeding  . Esophagogastroduodenoscopy    . Cholecystectomy  mid 90's  . Colonoscopy    . Hernia repair      umbilical hernia  . Endarterectomy  06/02/2012    Procedure: ENDARTERECTOMY CAROTID;  Surgeon: Serafina Mitchell, MD;  Location: [facility];  Service: Vascular;  Laterality: Left;  . Patch angioplasty  06/02/2012    Procedure: PATCH ANGIOPLASTY;  Surgeon: Serafina Mitchell, MD;  Location: [facility];  Service: Vascular;  Laterality: Left;  using Vascu-Guard Patch  . Colon surgery      History   Social History  . Marital Status: Married   Spouse Name: N/A    Number of Children: N/A  . Years of Education: N/A   Occupational History  . Not on file.   Social History Main Topics  . Smoking status: Former Research scientist (life sciences)  . Smokeless tobacco: Former Systems developer    Quit date: 12/26/1999  . Alcohol Use: No     Comment: Quit in 1998  . Drug Use: No  . Sexually Active: Yes   Other Topics Concern  . Not on file   Social History Narrative  . No narrative on file    Family History  Problem Relation Age of Onset  . Hypertension Mother   . Hyperlipidemia Mother   . Other Mother     varicose veins  . Hyperlipidemia Father   . Hypertension Father   . Diabetes Father   . Clotting disorder Father   . Deep vein thrombosis Father   . Hypertension Sister   . Diabetes Sister   . Hyperlipidemia Sister   . Hypertension Brother   . Hyperlipidemia Brother     Allergies as of 12/26/2012 - Review Complete 12/26/2012  Allergen Reaction Noted  . Plavix (clopidogrel bisulfate)  10/06/2012    Current Outpatient Prescriptions on File Prior to Visit  Medication Sig Dispense  Refill  . allopurinol (ZYLOPRIM) 100 MG tablet Take 1 tablet (100 mg total) by mouth daily.  30 tablet  2  . amLODipine (NORVASC) 10 MG tablet TAKE 1 TABLET (10 MG TOTAL) BY MOUTH DAILY.  30 tablet  3  . aspirin 81 MG tablet Take 81 mg by mouth 2 (two) times daily.       Marland Kitchen atorvastatin (LIPITOR) 40 MG tablet TAKE 1 TABLET BY MOUTH AT BEDTIME  90 tablet  0  . indomethacin (INDOCIN) 50 MG capsule Take 50 mg by mouth 2 (two) times daily as needed. For gout flare      . metoprolol (LOPRESSOR) 50 MG tablet TAKE 1 TABLET BY MOUTH DAILY  90 tablet  0  . pantoprazole (PROTONIX) 40 MG tablet TAKE 1 TABLET BY MOUTH DAILY  90 tablet  0  . oxyCODONE-acetaminophen (ROXICET) 5-325 MG per tablet Take 1 tablet by mouth every 4 (four) hours as needed for pain.  30 tablet  0  . simvastatin (ZOCOR) 20 MG tablet Take 20 mg by mouth at bedtime.       No current facility-administered  medications on file prior to visit.     REVIEW OF SYSTEMS: All systems are negative as documented by the patient in the encounter form  PHYSICAL EXAMINATION:   Vital signs are BP 147/69  Pulse 65  Ht 5\' 9"  (1.753 m)  Wt 204 lb 9.6 oz (92.806 kg)  BMI 30.2 kg/m2  SpO2 100% General: The patient appears their stated age. HEENT:  No gross abnormalities Pulmonary:  Non labored breathingtender Musculoskeletal: There are no major deformities. Neurologic: No focal weakness or paresthesias are detected, Skin: There are no ulcer or rashes noted. Psychiatric: The patient has normal affect. Cardiovascular: There is a regular rate and rhythm without significant murmur appreciated. No carotid bruit   Diagnostic Studies Carotid duplex was ordered and reviewed. This shows widely patent carotid endarterectomy sites bilaterally. The right renal artery stent was difficult to visualize however appears to be patent  Assessment: Carotid stenosis Renal artery stenosis Plan: Carotid stenosis: The patient remained asymptomatic. By ultrasound, both endarterectomy sites are widely patent. I will repeat his ultrasound in one year Renal artery stenosis: By ultrasound, his stent appears to be widely patent. All repeat his ultrasound in one year.  Eldridge Abrahams, M.D. [facility] [place] Office: [phone] Pager:  [phone]

## 2013-04-01 ENCOUNTER — Other Ambulatory Visit: Payer: Self-pay | Admitting: Family Medicine

## 2013-04-04 NOTE — Telephone Encounter (Signed)
Last seen 10/11/12  [facility]  Pharmacy requesting 90 days

## 2013-04-19 ENCOUNTER — Telehealth: Payer: Self-pay | Admitting: Pharmacist

## 2013-04-19 NOTE — Telephone Encounter (Signed)
appt scheduled

## 2013-04-24 ENCOUNTER — Encounter: Payer: Self-pay | Admitting: Family Medicine

## 2013-04-24 ENCOUNTER — Ambulatory Visit (INDEPENDENT_AMBULATORY_CARE_PROVIDER_SITE_OTHER): Payer: Medicare Other | Admitting: Family Medicine

## 2013-04-24 VITALS — BP 154/75 | HR 63 | Temp 97.3°F | Ht 69.25 in | Wt 200.0 lb

## 2013-04-24 DIAGNOSIS — E785 Hyperlipidemia, unspecified: Secondary | ICD-10-CM | POA: Diagnosis not present

## 2013-04-24 DIAGNOSIS — K219 Gastro-esophageal reflux disease without esophagitis: Secondary | ICD-10-CM

## 2013-04-24 DIAGNOSIS — M109 Gout, unspecified: Secondary | ICD-10-CM | POA: Diagnosis not present

## 2013-04-24 DIAGNOSIS — I10 Essential (primary) hypertension: Secondary | ICD-10-CM | POA: Diagnosis not present

## 2013-04-24 DIAGNOSIS — Z0000 Encounter for general adult medical examination without abnormal findings: Secondary | ICD-10-CM | POA: Diagnosis not present

## 2013-04-24 DIAGNOSIS — Z125 Encounter for screening for malignant neoplasm of prostate: Secondary | ICD-10-CM | POA: Diagnosis not present

## 2013-04-24 LAB — POCT URINALYSIS DIPSTICK
Bilirubin, UA: NEGATIVE
Blood, UA: NEGATIVE
Glucose, UA: NEGATIVE
Ketones, UA: NEGATIVE
Leukocytes, UA: NEGATIVE
Nitrite, UA: NEGATIVE
Protein, UA: NEGATIVE
Spec Grav, UA: 1.02
Urobilinogen, UA: NEGATIVE
pH, UA: 5

## 2013-04-24 LAB — POCT UA - MICROSCOPIC ONLY
Bacteria, U Microscopic: NEGATIVE
Casts, Ur, LPF, POC: NEGATIVE
Crystals, Ur, HPF, POC: NEGATIVE
Mucus, UA: NEGATIVE
RBC, urine, microscopic: NEGATIVE
WBC, Ur, HPF, POC: NEGATIVE
Yeast, UA: NEGATIVE

## 2013-04-24 MED ORDER — AMLODIPINE BESYLATE 10 MG PO TABS: 10.0000 mg | ORAL_TABLET | Freq: Every day | ORAL | Status: DC

## 2013-04-24 MED ORDER — PANTOPRAZOLE SODIUM 40 MG PO TBEC: 40.0000 mg | DELAYED_RELEASE_TABLET | Freq: Every day | ORAL | Status: DC

## 2013-04-24 MED ORDER — METOPROLOL TARTRATE 50 MG PO TABS: 50.0000 mg | ORAL_TABLET | Freq: Two times a day (BID) | ORAL | Status: DC

## 2013-04-24 MED ORDER — ATORVASTATIN CALCIUM 40 MG PO TABS: 40.0000 mg | ORAL_TABLET | Freq: Every day | ORAL | Status: DC

## 2013-04-24 MED ORDER — ALLOPURINOL 100 MG PO TABS: 100.0000 mg | ORAL_TABLET | Freq: Every day | ORAL | Status: DC

## 2013-04-24 MED ORDER — METOPROLOL TARTRATE 50 MG PO TABS: 50.0000 mg | ORAL_TABLET | Freq: Every day | ORAL | Status: DC

## 2013-04-24 NOTE — Patient Instructions (Signed)
Atherosclerosis  Atherosclerosis, or hardening of the arteries, is the buildup of plaque within the major arteries in the body. Plaque is made up of fats (lipids), cholesterol, calcium, and fibrous tissue. Plaque can narrow or block blood flow within an artery. Plaque can break off and cause damage to the affected organ. Plaque can also "rupture." When plaque ruptures within an artery, a clot can form, causing a sudden (acute) blockage of the artery. Untreated atherosclerosis can cause serious health problems or death.   COMMON ATHEROSCLEROSIS RISK FACTORS  · High cholesterol levels.  · Smoking.  · Obesity.  · Lack of activity or exercise.  · Eating a diet high in saturated fat.  · Family history.  · Diabetes.  SYMPTOMS   Symptoms of atherosclerosis can occur when blood flow to an artery is slowed or blocked. Severity and onset of symptoms depends on how extensive the narrowing or blockage is. A sudden plaque rupture can bring immediate, life-threatening symptoms. Atherosclerosis can affect different arteries in the body, for example:  · Coronary arteries. The coronary arteries supply the heart with blood. When the coronary arteries are narrowed or blocked from atherosclerosis, this is known as coronary artery disease (CAD). CAD can cause a heart attack. Common heart attack symptoms include:  · Chest pain or pain that radiates to the neck, arm, jaw, or in the upper, middle back (mid-scapular pain).  · Shortness of breath without cause.  · Profuse sweating while at rest.  · Irregular heartbeats.  · Nausea or gastrointestinal upset.  · Carotid arteries. The carotid arteries supply the brain with blood. They are located on each side of your neck. When blood flow to these arteries is slowed or blocked, a transiant ischemic attack (TIA) or stroke can occur. A TIA is considered a "mini-stroke" or "warning stroke." TIA symptoms are the same as stroke symptoms, but they are temporary and last less than 24 hours. A stroke  can cause permanent damage or death. Common TIA and stroke symptoms include:  · Sudden numbness or weakness to one side of your body, such as the face, arm, or leg.  · Sudden confusion or trouble speaking or understanding.  · Sudden trouble seeing out of one or both eyes.  · Sudden trouble walking, loss of balance, or dizziness.  · Sudden, severe headache with no known cause.  · Arteries in the legs. When arteries in the lower legs become narrowed or blocked, this is known as peripheral vascular disease(PVD). PVD can cause a symptom called claudication. Claudication is pain or a burning feeling in your legs when walking or exercising and usually goes away with rest. Very severe PVD can cause pain in your legs while at rest.  · Renal arteries. The renal arteries supply the kidneys with blood. Blockage of the renal arteries can cause a decline in kidney function or high blood pressure (hypertension).  · Gastrointestinal arteries (mesenteric circulation). Abdominal pain may occur after eating.  DIAGNOSIS   Your caregiver may perform the following tests to diagnose atherosclerosis:  · Blood tests.  · Stress Test.  · Echocardiogram.  · Nuclear scan.  · Ankle/brachial index.  · Ultrasonography.  · Computed tomography (CT) scan.  · Angiography.  TREATMENT   Atherosclerosis treatment includes the following:  · Lifestyle changes such as:  · Quitting smoking. Your caregiver can help you with smoking cessation.  · Eat a diet low in saturated fat. A registered dietician can educate you on healthy food options such as helping you   understand the difference between good fat and bad fat.  · Following an exercise program approved by your caregiver.  · Maintaining a healthy weight. Lose weight as approved by your caregiver.  · Have your cholesterol levels checked as directed by your caregiver.  · Medicines. Cholesterol medicines can help slow or stop the progression of atherosclerosis.  · Different procedural or surgical  interventions to treat atherosclerosis include:  · Balloon angioplasty. The technical name for balloon angioplasty is called percutaneous transluminal angioplasty(PTA). In this procedure, a catheter with a small balloon at the tip is inserted through the blocked or narrowed artery. The balloon is then inflated. When the balloon is inflated, the fatty plaque is compressed against the artery wall, allowing better blood flow within the artery.  · Balloon angioplasty and stenting. In this procedure, balloon angioplasty is combined with a stenting procedure. A stent is a small, metal mesh tube that keeps the artery open. After the artery is opened up by the balloon technique, the stent is then deployed. The stent is permanent.  · Open heart surgery or bypass surgery. To perform this type of surgery, a healthy vessel is first "harvested" from either the leg or arm. The harvested vessel is then used to "bypass" the blocked atherosclerotic vessel so new blood flow can be established.  · Atherectomy. Atherectomy is a procedure that uses a catheter with a sharp blade to remove plaque from an artery. A chamber in the catheter collects the plaque.  · Endarterectomy. An endarterectomy is a surgical procedure where a surgeon removes plaque from an artery.  · Amputation. When blockages in the lower legs are very severe and circulation cannot be restored, amputation may be required.  SEEK IMMEDIATE MEDICAL CARE IF:  · You are having heart attack symptoms, such as:  · Chest pain or pain that radiates to the neck, arm, jaw, or in the upper, middle back (mid-scapular pain).  · Shortness of breath without cause.  · Profuse sweating while at rest.  · Irregular heartbeats.  · Nausea or gastrointestinal upset.  · You are having stroke symptoms, such as sudden:  · Numbness or weakness to one side of your body, such as the face, arm, or leg.  · Confusion or trouble speaking or understanding.  · Trouble seeing out of one or both  eyes.  · Trouble walking, loss of balance, or dizziness.  · Severe headache with no known cause.  · Your hands or feet are bluish, cold, or you have pain in them.  · You have bad abdominal pain after eating.  Seek help immediately if you have heart attack or stroke symptoms. Do not drive yourself to the hospital. Call your local emergency service immediately! Do not wait to see if these symptoms go away:  Document Released: 10/03/2003 Document Revised: 01/12/2012 Document Reviewed: 09/15/2011  [employer]® Patient Information ©2014 [employer].

## 2013-04-24 NOTE — Progress Notes (Signed)
  Subjective:    Patient ID: James [name], male    DOB: 1945/10/01, 67 y.o.   MRN: [id number]  HPI This 67 y.o. male presents for evaluation of routine follow up.  He has hx of left carotid artery stenosis and subsequent CEA surgery.  He sees vascular surgery.  He has hx of RAS and has a renal artery Stent.  He has hx of hypertension and gout.  He has hyperlipidemia.  He needs refills.   Review of Systems No chest pain, SOB, HA, dizziness, vision change, N/V, diarrhea, constipation, dysuria, urinary urgency or frequency, myalgias, arthralgias or rash.     Objective:   Physical Exam Vital signs noted  Well developed well nourished male.  HEENT - Head atraumatic Normocephalic                Eyes - PERRLA, Conjuctiva - clear Sclera- Clear EOMI                Ears - EAC's Wnl TM's Wnl Gross Hearing WNL                Nose - Nares patent                 Throat - oropharanx wnl Respiratory - Lungs CTA bilateral Cardiac - RRR S1 and S2 without murmur GI - Abdomen soft Nontender and bowel sounds active x 4 Extremities - No edema. Neuro - Grossly intact.       Assessment & Plan:  GERD (gastroesophageal reflux disease) - Plan: pantoprazole (PROTONIX) 40 MG tablet, CBC With differential/Platelet  Essential hypertension, benign - Plan: amLODipine (NORVASC) 10 MG tablet, CBC With differential/Platelet  Gout - Plan: allopurinol (ZYLOPRIM) 100 MG tablet, CBC With differential/Platelet  Other and unspecified hyperlipidemia - Plan: atorvastatin (LIPITOR) 40 MG tablet, CBC With differential/Platelet  Routine general medical examination at a health care facility - Plan: CMP14+EGFR, Lipid panel, Thyroid Panel With TSH, PSA, total and free, POCT UA - Microscopic Only, POCT urinalysis dipstick, CBC With differential/Platelet, CANCELED: POCT CBC.  Lysbeth Penner FNP

## 2013-04-25 LAB — CMP14+EGFR
ALT: 16 IU/L (ref 0–44)
AST: 21 IU/L (ref 0–40)
Albumin/Globulin Ratio: 1.6 (ref 1.1–2.5)
Albumin: 4.4 g/dL (ref 3.6–4.8)
Alkaline Phosphatase: 175 IU/L — ABNORMAL HIGH (ref 39–117)
BUN/Creatinine Ratio: 10 (ref 10–22)
BUN: 12 mg/dL (ref 8–27)
CO2: 22 mmol/L (ref 18–29)
Calcium: 9.5 mg/dL (ref 8.6–10.2)
Chloride: 99 mmol/L (ref 97–108)
Creatinine, Ser: 1.2 mg/dL (ref 0.76–1.27)
GFR calc Af Amer: 72 mL/min/{1.73_m2} (ref >59)
GFR calc non Af Amer: 62 mL/min/{1.73_m2} (ref >59)
Globulin, Total: 2.8 g/dL (ref 1.5–4.5)
Glucose: 97 mg/dL (ref 65–99)
Potassium: 4.4 mmol/L (ref 3.5–5.2)
Sodium: 141 mmol/L (ref 134–144)
Total Bilirubin: 0.5 mg/dL (ref 0.0–1.2)
Total Protein: 7.2 g/dL (ref 6.0–8.5)

## 2013-04-25 LAB — THYROID PANEL WITH TSH
Free Thyroxine Index: 2.5 (ref 1.2–4.9)
T3 Uptake Ratio: 26 % (ref 24–39)
T4, Total: 9.8 ug/dL (ref 4.5–12.0)
TSH: 1.01 u[IU]/mL (ref 0.450–4.500)

## 2013-04-25 LAB — CBC WITH DIFFERENTIAL
Basophils Absolute: 0.1 10*3/uL (ref 0.0–0.2)
Basos: 2 %
Eos: 2 %
Eosinophils Absolute: 0.2 10*3/uL (ref 0.0–0.4)
HCT: 46 % (ref 37.5–51.0)
Hemoglobin: 15.9 g/dL (ref 12.6–17.7)
Immature Grans (Abs): 0 10*3/uL (ref 0.0–0.1)
Immature Granulocytes: 0 %
Lymphocytes Absolute: 1.6 10*3/uL (ref 0.7–3.1)
Lymphs: 18 %
MCH: 27.9 pg (ref 26.6–33.0)
MCHC: 34.6 g/dL (ref 31.5–35.7)
MCV: 81 fL (ref 79–97)
Monocytes Absolute: 0.6 10*3/uL (ref 0.1–0.9)
Monocytes: 6 %
Neutrophils Absolute: 6.6 10*3/uL (ref 1.4–7.0)
Neutrophils Relative %: 72 %
Platelets: 272 10*3/uL (ref 150–379)
RBC: 5.69 x10E6/uL (ref 4.14–5.80)
RDW: 14.2 % (ref 12.3–15.4)
WBC: 9.2 10*3/uL (ref 3.4–10.8)

## 2013-04-25 LAB — LIPID PANEL
Chol/HDL Ratio: 2.5 ratio units (ref 0.0–5.0)
Cholesterol, Total: 135 mg/dL (ref 100–199)
HDL: 53 mg/dL (ref >39)
LDL Calculated: 58 mg/dL (ref 0–99)
Triglycerides: 120 mg/dL (ref 0–149)
VLDL Cholesterol Cal: 24 mg/dL (ref 5–40)

## 2013-04-25 LAB — PSA, TOTAL AND FREE
PSA, Free Pct: 28.7 %
PSA, Free: 0.86 ng/mL
PSA: 3 ng/mL (ref 0.0–4.0)

## 2013-07-11 ENCOUNTER — Other Ambulatory Visit: Payer: Self-pay | Admitting: Nurse Practitioner

## 2013-07-26 DIAGNOSIS — L719 Rosacea, unspecified: Secondary | ICD-10-CM | POA: Diagnosis not present

## 2013-07-26 DIAGNOSIS — L738 Other specified follicular disorders: Secondary | ICD-10-CM | POA: Diagnosis not present

## 2013-07-26 DIAGNOSIS — L821 Other seborrheic keratosis: Secondary | ICD-10-CM | POA: Diagnosis not present

## 2013-09-05 ENCOUNTER — Other Ambulatory Visit: Payer: Self-pay | Admitting: Surgery

## 2013-09-05 DIAGNOSIS — I701 Atherosclerosis of renal artery: Secondary | ICD-10-CM

## 2013-09-05 DIAGNOSIS — Z48812 Encounter for surgical aftercare following surgery on the circulatory system: Secondary | ICD-10-CM

## 2013-09-05 DIAGNOSIS — I6529 Occlusion and stenosis of unspecified carotid artery: Secondary | ICD-10-CM

## 2013-12-29 ENCOUNTER — Encounter: Payer: Self-pay | Admitting: Family

## 2014-01-01 ENCOUNTER — Ambulatory Visit (HOSPITAL_COMMUNITY)
Admission: RE | Admit: 2014-01-01 | Discharge: 2014-01-01 | Disposition: A | Discharge status: Home / Self Care | Payer: Medicare Other | Source: Ambulatory Visit | Attending: Family | Admitting: Family

## 2014-01-01 ENCOUNTER — Ambulatory Visit (INDEPENDENT_AMBULATORY_CARE_PROVIDER_SITE_OTHER): Payer: Medicare Other | Admitting: Family

## 2014-01-01 ENCOUNTER — Other Ambulatory Visit: Payer: Self-pay | Admitting: Surgery

## 2014-01-01 ENCOUNTER — Encounter: Payer: Self-pay | Admitting: Family

## 2014-01-01 ENCOUNTER — Other Ambulatory Visit (HOSPITAL_COMMUNITY): Payer: Self-pay

## 2014-01-01 ENCOUNTER — Ambulatory Visit (INDEPENDENT_AMBULATORY_CARE_PROVIDER_SITE_OTHER)
Admission: RE | Admit: 2014-01-01 | Discharge: 2014-01-01 | Disposition: A | Discharge status: Home / Self Care | Payer: Medicare Other | Source: Ambulatory Visit | Attending: Surgery | Admitting: Surgery

## 2014-01-01 VITALS — BP 162/87 | HR 68 | Resp 16 | Ht 70.0 in | Wt 205.0 lb

## 2014-01-01 DIAGNOSIS — Z48812 Encounter for surgical aftercare following surgery on the circulatory system: Secondary | ICD-10-CM

## 2014-01-01 DIAGNOSIS — I6529 Occlusion and stenosis of unspecified carotid artery: Secondary | ICD-10-CM

## 2014-01-01 DIAGNOSIS — I701 Atherosclerosis of renal artery: Secondary | ICD-10-CM | POA: Diagnosis not present

## 2014-01-01 NOTE — Progress Notes (Signed)
Established Carotid Patient   History of Present Illness  James [name] is a 68 y.o. male patient of Dr. Trula Slade. The patient is back today for followup. He is status post left carotid endarterectomy for asymptomatic stenosis in November of 2013. He is status post right carotid endarterectomy in 2008. He has also undergone a right renal artery stenting. Pt states his blood pressure usually is 130-140's/ 60's -75.  Patient has Negative history of TIA or stroke symptom.  The patient denies amaurosis fugax or monocular blindness.  The patient  denies facial drooping.  Pt. denies hemiplegia.  The patient denies receptive or expressive aphasia.  Pt. denies extremity weakness. Pt denies claudication symptoms with walking, denies non healing wounds.   Pt denies New Medical or Surgical History.  Pt Diabetic: No Pt smoker: former smoker, quit in 2001 when he had a partial small bowel resection for GI bleed  Pt meds include: Statin : Yes ASA: Yes Other anticoagulants/antiplatelets: no   Past Medical History  Diagnosis Date  . Arthritis   . Hyperlipidemia     takes Lipitor daily  . H/O: GI bleed   . Carotid artery occlusion   . H/O renal artery stenosis   . DVT (deep venous thrombosis) 2008  . Hypertension     takes Metoprolol and Amlodipine daily  . Joint swelling   . Joint pain   . Gout     takes Allopurinol daily and Indomethacin prn  . GERD (gastroesophageal reflux disease)     takes Protonix daily  . History of gastric ulcer   . History of colon polyps   . History of blood transfusion   . Gout flare   . DVT (deep venous thrombosis)     Social History History  Substance Use Topics  . Smoking status: Former Research scientist (life sciences)  . Smokeless tobacco: Former Systems developer    Quit date: 12/26/1999  . Alcohol Use: No     Comment: Quit in 1998    Family History Family History  Problem Relation Age of Onset  . Hypertension Mother   . Hyperlipidemia Mother   . Other Mother      varicose veins  . Hyperlipidemia Father   . Hypertension Father   . Diabetes Father   . Clotting disorder Father   . Deep vein thrombosis Father   . Hypertension Sister   . Diabetes Sister   . Hyperlipidemia Sister   . Hypertension Brother   . Hyperlipidemia Brother     Surgical History Past Surgical History  Procedure Laterality Date  . Renal artery stent  2009    Right renal artery stenting  . Carotid endarterectomy  2008    right CEA  . Small intestine removed  2001    d/t random gi bleeding  . Esophagogastroduodenoscopy    . Cholecystectomy  mid 90's  . Colonoscopy    . Hernia repair      umbilical hernia  . Endarterectomy  06/02/2012    Procedure: ENDARTERECTOMY CAROTID;  Surgeon: Serafina Mitchell, MD;  Location: [facility];  Service: Vascular;  Laterality: Left;  . Patch angioplasty  06/02/2012    Procedure: PATCH ANGIOPLASTY;  Surgeon: Serafina Mitchell, MD;  Location: [facility];  Service: Vascular;  Laterality: Left;  using Vascu-Guard Patch  . Colon surgery      Allergies  Allergen Reactions  . Plavix [Clopidogrel Bisulfate]     GI Bleed    Current Outpatient Prescriptions  Medication Sig Dispense Refill  . allopurinol (ZYLOPRIM)  100 MG tablet Take 1 tablet (100 mg total) by mouth daily.  90 tablet  3  . amLODipine (NORVASC) 10 MG tablet Take 1 tablet (10 mg total) by mouth daily.  90 tablet  3  . aspirin 81 MG tablet Take 81 mg by mouth 2 (two) times daily.       Marland Kitchen atorvastatin (LIPITOR) 40 MG tablet Take 1 tablet (40 mg total) by mouth daily.  90 tablet  3  . indomethacin (INDOCIN) 50 MG capsule TAKE 1 CAPSULE THREE TIMES A DAY  30 capsule  2  . metoprolol (LOPRESSOR) 50 MG tablet Take 1 tablet (50 mg total) by mouth daily.  90 tablet  3  . pantoprazole (PROTONIX) 40 MG tablet Take 1 tablet (40 mg total) by mouth daily.  90 tablet  3   No current facility-administered medications for this visit.    Review of Systems : See HPI for pertinent positives and  negatives.  Physical Examination  Filed Vitals:   01/01/14 1032 01/01/14 1036  BP: 159/87 162/87  Pulse: 66 68  Resp:  16  Height:  5\' 10"  (1.778 m)  Weight:  205 lb (92.987 kg)  SpO2:  98%   Body mass index is 29.41 kg/(m^2).  General: WDWN male in NAD GAIT: normal Eyes: PERRLA Pulmonary:  Non-labored, CTAB, Negative  Rales, Negative rhonchi, & Negative wheezing.  Cardiac: regular Rhythm ,  Negative detected murmur.  VASCULAR EXAM Carotid Bruits Left Right   Negative Negative    Aorta is not palpable. Radial pulses are 2+ palpable and equal.                                                                                                                            LE Pulses LEFT RIGHT       POPLITEAL  not palpable   not palpable       POSTERIOR TIBIAL  not palpable   not palpable        DORSALIS PEDIS      ANTERIOR TIBIAL 2+ palpable   2+ palpable     Gastrointestinal: soft, nontender, BS WNL, no r/g,  negative masses.  Musculoskeletal: Negative muscle atrophy/wasting. M/S 5/5 throughout, Extremities without ischemic changes.  Neurologic: A&O X 3; Appropriate Affect ; SENSATION ;normal;  Speech is normal CN 2-12 intact, Pain and light touch intact in extremities, Motor exam as listed above.   Non-Invasive Vascular Imaging CAROTID DUPLEX 01/01/2014   CEREBROVASCULAR DUPLEX EVALUATION    INDICATION: Carotid endarterectomy     PREVIOUS INTERVENTION(S): Right carotid endarterectomy on 04/21/07; Left carotid endarterectomy on 06/02/12    DUPLEX EXAM:     RIGHT  LEFT  Peak Systolic Velocities (cm/s) End Diastolic Velocities (cm/s) Plaque LOCATION Peak Systolic Velocities (cm/s) End Diastolic Velocities (cm/s) Plaque  115 20  CCA PROXIMAL 105 16   113 24  CCA MID 111 20   90 18  CCA DISTAL 97 21   106 14  ECA 30 7   68 20  ICA PROXIMAL 70 24   72 26  ICA MID 68 24   53 16  ICA DISTAL 47 19     Not Calculated ICA / CCA Ratio (PSV) Not Calculated  Antegrade  Vertebral Flow Antegrade  [id number] Brachial Systolic Pressure (mmHg) [id number]  Multiphasic (subclavian artery) Brachial Artery Waveforms Multiphasic (subclavian artery)    Plaque Morphology:  HM = Homogeneous, HT = Heterogeneous, CP = Calcific Plaque, SP = Smooth Plaque, IP = Irregular Plaque     ADDITIONAL FINDINGS: No significant stenosis of the bilateral external or common carotid arteries.    IMPRESSION: Patent bilateral carotid endarterectomy sites with no bilateral internal carotid artery stenoses.    Compared to the previous exam:  No significant change noted when compared to the previous exam on 12/26/12.      RENAL ARTERY DUPLEX EVALUATION    INDICATION: Renal artery stenosis    PREVIOUS INTERVENTION(S): Right renal artery stent on 02/08/08    DUPLEX EXAM:     AORTA Peak Systolic Velocity (cm/s): 65    RIGHT  LEFT   Peak Systolic Velocities (cm/s) Comments  Peak Systolic Velocities (cm/s) Comments  177  Renal Artery Origin/Proximal 109   118  Renal Artery Mid 121   86  Renal Artery Distal 60     Accessory Renal Artery (when present)    2.7  Renal / Aortic Ratio (RAR) 1.9  9.9 Kidney Size (cm) 10.6  Patent  Renal Vein Patent    ADDITIONAL FINDINGS:   Decreased visualization of the abdominal vasculature and renal artery stent due to overlying bowel gas patterns.   The bilateral kidney length measurements are within normal limits.   There appears to be a 1.5cm cyst in the upper pole of the right kidney.    IMPRESSION: Patent right renal artery stent with Doppler velocities suggestive of no stenosis of greater than 60% noted in the bilateral renal arteries, based on limited visualization, as described above.    Compared to the previous exam:  No significant change noted when compared to the previous exam on 12/26/12.      Assessment: James [name] is a 68 y.o. male who is status post left carotid endarterectomy for asymptomatic stenosis in November of 2013. He is status  post right carotid endarterectomy in 2008. He has also undergone a right renal artery stenting. He has no history of stroke or TIA. Patent bilateral carotid endarterectomy sites with no bilateral internal carotid artery stenoses. No significant change noted when compared to the previous exam on 12/26/12.  Pt states his blood pressure usually is 130-140's/ 60's -75. Patent right renal artery stent with Doppler velocities suggestive of no stenosis of greater than 60% noted in the bilateral renal arteries, based on limited visualization. No significant change noted when compared to the previous exam on 12/26/12.  Plan: Follow-up in 1 year with Carotid Duplex scan and bilateral renal artery Duplex.   I discussed in depth with the patient the nature of atherosclerosis, and emphasized the importance of maximal medical management including strict control of blood pressure, blood glucose, and lipid levels, obtaining regular exercise, and continued cessation of smoking.  The patient is aware that without maximal medical management the underlying atherosclerotic disease process will progress, limiting the benefit of any interventions. The patient was given information about stroke prevention and what symptoms should prompt the patient to seek immediate medical care. Thank you for allowing [place] to participate in this  patient's care.  Clemon Chambers, RN, MSN, FNP-C [facility] Office: [facility] Physician: Trula Slade  01/01/2014 10:03 AM

## 2014-01-01 NOTE — Patient Instructions (Addendum)
Stroke Prevention Some medical conditions and behaviors are associated with an increased chance of having a stroke. You may prevent a stroke by making healthy choices and managing medical conditions. HOW CAN I REDUCE MY RISK OF HAVING A STROKE?   Stay physically active. Get at least 30 minutes of activity on most or all days.  Do not smoke. It may also be helpful to avoid exposure to secondhand smoke.  Limit alcohol use. Moderate alcohol use is considered to be:  No more than 2 drinks per day for men.  No more than 1 drink per day for nonpregnant women.  Eat healthy foods. This involves  Eating 5 or more servings of fruits and vegetables a day.  Following a diet that addresses high blood pressure (hypertension), high cholesterol, diabetes, or obesity.  Manage your cholesterol levels.  A diet low in saturated fat, trans fat, and cholesterol and high in fiber may control cholesterol levels.  Take any prescribed medicines to control cholesterol as directed by your health care provider.  Manage your diabetes.  A controlled-carbohydrate, controlled-sugar diet is recommended to manage diabetes.  Take any prescribed medicines to control diabetes as directed by your health care provider.  Control your hypertension.  A low-salt (sodium), low-saturated fat, low-trans fat, and low-cholesterol diet is recommended to manage hypertension.  Take any prescribed medicines to control hypertension as directed by your health care provider.  Maintain a healthy weight.  A reduced-calorie, low-sodium, low-saturated fat, low-trans fat, low-cholesterol diet is recommended to manage weight.  Stop drug abuse.  Avoid taking birth control pills.  Talk to your health care provider about the risks of taking birth control pills if you are over 74 years old, smoke, get migraines, or have ever had a blood clot.  Get evaluated for sleep disorders (sleep apnea).  Talk to your health care provider about  getting a sleep evaluation if you snore a lot or have excessive sleepiness.  Take medicines as directed by your health care provider.  For some people, aspirin or blood thinners (anticoagulants) are helpful in reducing the risk of forming abnormal blood clots that can lead to stroke. If you have the irregular heart rhythm of atrial fibrillation, you should be on a blood thinner unless there is a good reason you cannot take them.  Understand all your medicine instructions.  Make sure that other other conditions (such as anemia or atherosclerosis) are addressed. SEEK IMMEDIATE MEDICAL CARE IF:   You have sudden weakness or numbness of the face, arm, or leg, especially on one side of the body.  Your face or eyelid droops to one side.  You have sudden confusion.  You have trouble speaking (aphasia) or understanding.  You have sudden trouble seeing in one or both eyes.  You have sudden trouble walking.  You have dizziness.  You have a loss of balance or coordination.  You have a sudden, severe headache with no known cause.  You have new chest pain or an irregular heartbeat. Any of these symptoms may represent a serious problem that is an emergency. Do not wait to see if the symptoms will go away. Get medical help at once. Call your local emergency services  (911 in [place] Do not drive yourself to the hospital. Document Released: 08/20/2004 Document Revised: 05/03/2013 Document Reviewed: 01/13/2013 [facility] Patient Information 2014 [place].   Renal Artery Stenosis Renal artery stenosis (RAS) is the narrowing of the artery that supplies blood to the kidney. If the narrowing is critical and  the kidney does not get enough blood, hypertension (high blood pressure) can develop. This is called renal vascular hypertension (RVH). This is a common, uncommon cause of secondary hypertension. It does not usually happen until there is at least a 70% narrowing of the artery. Decreased blood  flow through the renal artery causes the kidney to release increased amounts of a hormone. It is called renin. Renin is a strong blood pressure regulator. When it is high, it causes changes that lead to hypertension. Eventually the kidney not receiving enough blood may shrink in size and become less useful. The high blood pressure that is produced can eventually damage and destroy the remaining kidney. This is called hypertensive nephrosclerosis. If both kidneys fail, it will lead to chronic renal failure.  CAUSES  Most renal artery stenosis is caused by a hardening of the arteries (atherosclerosis). This is called Atherosclerotic Renal Artery Stenosis (AS-RAS). It is caused by a build-up of cholesterol (plaques) on the inner lining of the renal artery. A much less common cause is Fibromuscular Dysplasia (FMD). With it, there is an abnormality in the muscular lining of the renal artery. FMD-RAS occurs almost exclusively in women aged 50 to 107. It rarely affects African Americans or Asians.  SYMPTOMS  Often high blood pressure is discovered on a routine blood pressure check. It may be the only sign that something is wrong. Other problems that may occur are:  You may develop calf pain when walking. This is called intermittent claudication. It may be a sign of bad circulation in the legs.  Inability to use certain blood pressure pills such as angiotensin-I (ACE-I) inhibitors or angiotensin receptor blockers (ARB's). These could cause sudden drops in blood pressure with worsening of kidney function.  More than three antihypertensive medications may be needed for blood pressure control.  New onset of high blood pressure if you are over 55. DIAGNOSIS  Your caregiver may find suggestions of this on exam if he finds bruits (like murmurs) on listening to your abdomen (belly) or the large arteries in your neck. Your caregiver may also suspect this there is a sudden worsening of your blood pressure when it has  been well controlled and you are over age 25. Additional testing that may be done includes:  One diagnostic method used for renal artery stenosis (RAS) is to measure and compare the level of renin, (blood pressure-regulating hormone released by the kidneys), in the right to the left renal veins. If the amount of renin released by one-side is markedly higher than the other, this identifies a high renin-releasing kidney consistent with RAS.  FMD-RAS is often found on renal scan with ACE-inhibitor challenge, or ultrasound with Doppler.  FMD responds well to angioplasty and stenting. The results of stenting in FMD are usually long lasting. RISK FACTORS  Most renal artery stenosis is caused by a hardening of the arteries. This is called atherosclerosis. Other risk factors associated with the development of atherosclerotic RAS include the following:   Carotid artery disease.  Obesity.  High blood pressure.  Heredity.  Old age.  Fibromuscular dysplasia.  Diabetes mellitus.  Smoking.  Hardening of the arteries. TREATMENT   Renal vascular hypertension can be very severe. It can also be difficult to control.  Medication is used to control high blood pressure (hypertension). Blood pressure medications that directly affect the renin angiotensin pathway can be used toe help control blood pressure. ACE inhibitors and angiotensin receptor blockers (ARB's) are often effective in patients with unilateral RAS. In  some cases, patients with RAS are resistant to these medications.  In patients with bilateral RAS, these medications must be used carefully. They may cause acute renal failure (ARF). If acute renal failure develops (if creatinine increases by more than 30%), the medication is discontinued. The patient is evaluated for bilateral RAS.  Angioplasty and stenting may be used to improve blood flow. The goal is to improve the circulation of blood flow to the kidney and prevent the release of  excess renin, which can help to decrease blood pressure. This helps to prevent atrophy of the kidney. In general, patients with AS-RAS should have stenting done. This is because plasty by itself has a high incidence of re-stenosis.  Surgery to bypass the narrowing may be done. If the kidney with RAS has diminished in size or strength (atrophied ), surgical removal of the kidney may be advised. This is called nephrectomy. Document Released: 04/08/2005 Document Revised: 10/05/2011 Document Reviewed: 07/12/2008 [facility] Patient Information 2014 [place].

## 2014-01-01 NOTE — Addendum Note (Signed)
Addended by: Mena Goes on: 01/01/2014 05:24 PM   Modules accepted: Orders

## 2014-03-26 ENCOUNTER — Other Ambulatory Visit: Payer: Self-pay | Admitting: Family Medicine

## 2014-03-27 NOTE — Telephone Encounter (Signed)
Patient last seen in on 04-24-13. Please advise

## 2014-04-19 ENCOUNTER — Other Ambulatory Visit: Payer: Self-pay | Admitting: Family Medicine

## 2014-06-07 ENCOUNTER — Other Ambulatory Visit: Payer: Self-pay | Admitting: Family Medicine

## 2014-06-12 ENCOUNTER — Ambulatory Visit (INDEPENDENT_AMBULATORY_CARE_PROVIDER_SITE_OTHER): Payer: Medicare Other

## 2014-06-12 ENCOUNTER — Encounter: Payer: Self-pay | Admitting: Family Medicine

## 2014-06-12 ENCOUNTER — Ambulatory Visit (INDEPENDENT_AMBULATORY_CARE_PROVIDER_SITE_OTHER): Payer: Medicare Other | Admitting: Family Medicine

## 2014-06-12 VITALS — BP 126/69 | HR 80 | Temp 97.9°F | Ht 69.0 in | Wt 196.9 lb

## 2014-06-12 DIAGNOSIS — I2510 Atherosclerotic heart disease of native coronary artery without angina pectoris: Secondary | ICD-10-CM | POA: Diagnosis not present

## 2014-06-12 DIAGNOSIS — I10 Essential (primary) hypertension: Secondary | ICD-10-CM | POA: Diagnosis not present

## 2014-06-12 DIAGNOSIS — K2100 Gastro-esophageal reflux disease with esophagitis, without bleeding: Secondary | ICD-10-CM

## 2014-06-12 DIAGNOSIS — M10019 Idiopathic gout, unspecified shoulder: Secondary | ICD-10-CM | POA: Diagnosis not present

## 2014-06-12 DIAGNOSIS — M1000 Idiopathic gout, unspecified site: Secondary | ICD-10-CM | POA: Diagnosis not present

## 2014-06-12 DIAGNOSIS — Z23 Encounter for immunization: Secondary | ICD-10-CM

## 2014-06-12 DIAGNOSIS — E785 Hyperlipidemia, unspecified: Secondary | ICD-10-CM | POA: Diagnosis not present

## 2014-06-12 DIAGNOSIS — Z139 Encounter for screening, unspecified: Secondary | ICD-10-CM

## 2014-06-12 DIAGNOSIS — Z125 Encounter for screening for malignant neoplasm of prostate: Secondary | ICD-10-CM | POA: Diagnosis not present

## 2014-06-12 LAB — POCT UA - MICROSCOPIC ONLY
Bacteria, U Microscopic: NEGATIVE
Casts, Ur, LPF, POC: NEGATIVE
Crystals, Ur, HPF, POC: NEGATIVE
Mucus, UA: NEGATIVE
RBC, urine, microscopic: NEGATIVE
WBC, Ur, HPF, POC: NEGATIVE
Yeast, UA: NEGATIVE

## 2014-06-12 LAB — POCT CBC
Granulocyte percent: 74.6 %G (ref 37–80)
HCT, POC: 46.3 % (ref 43.5–53.7)
Hemoglobin: 15 g/dL (ref 14.1–18.1)
Lymph, poc: 1.6 (ref 0.6–3.4)
MCH, POC: 27 pg (ref 27–31.2)
MCHC: 32.3 g/dL (ref 31.8–35.4)
MCV: 83.6 fL (ref 80–97)
MPV: 8.9 fL (ref 0–99.8)
POC Granulocyte: 5.6 (ref 2–6.9)
POC LYMPH PERCENT: 20.8 %L (ref 10–50)
Platelet Count, POC: 222 10*3/uL (ref 142–424)
RBC: 5.5 M/uL (ref 4.69–6.13)
RDW, POC: 14.3 %
WBC: 7.5 10*3/uL (ref 4.6–10.2)

## 2014-06-12 LAB — POCT URINALYSIS DIPSTICK
Bilirubin, UA: NEGATIVE
Blood, UA: NEGATIVE
Glucose, UA: NEGATIVE
Ketones, UA: NEGATIVE
Leukocytes, UA: NEGATIVE
Nitrite, UA: NEGATIVE
Protein, UA: NEGATIVE
Spec Grav, UA: 1.02
Urobilinogen, UA: NEGATIVE
pH, UA: 5

## 2014-06-12 MED ORDER — AMLODIPINE BESYLATE 10 MG PO TABS: ORAL_TABLET | ORAL | Status: DC

## 2014-06-12 MED ORDER — PANTOPRAZOLE SODIUM 40 MG PO TBEC: DELAYED_RELEASE_TABLET | ORAL | Status: DC

## 2014-06-12 MED ORDER — ATORVASTATIN CALCIUM 40 MG PO TABS: ORAL_TABLET | ORAL | Status: DC

## 2014-06-12 MED ORDER — ALLOPURINOL 100 MG PO TABS: ORAL_TABLET | ORAL | Status: DC

## 2014-06-12 MED ORDER — INDOMETHACIN 50 MG PO CAPS: ORAL_CAPSULE | ORAL | Status: DC

## 2014-06-12 MED ORDER — METOPROLOL TARTRATE 50 MG PO TABS: 50.0000 mg | ORAL_TABLET | Freq: Every day | ORAL | Status: DC

## 2014-06-12 NOTE — Progress Notes (Signed)
   Subjective:    Patient ID: James [name], male    DOB: 03-28-1946, 68 y.o.   MRN: [id number]  HPI Patient is here for CPE.  He has hx of hyperlipidemia, htn, gout, GERD, carotid artery stenosis, and hypertension.  He denies any c/o chest pain or SOB.  He has s/p right ECA and sees vascular. He is due for labs.  He has hx of ASCVD and would like CXR.   Review of Systems  Constitutional: Negative for fever.  HENT: Negative for ear pain.   Eyes: Negative for discharge.  Respiratory: Negative for cough.   Cardiovascular: Negative for chest pain.  Gastrointestinal: Negative for abdominal distention.  Endocrine: Negative for polyuria.  Genitourinary: Negative for difficulty urinating.  Musculoskeletal: Negative for gait problem and neck pain.  Skin: Negative for color change and rash.  Neurological: Negative for speech difficulty and headaches.  Psychiatric/Behavioral: Negative for agitation.       Objective:    BP 126/69 mmHg  Pulse 80  Temp(Src) 97.9 F (36.6 C) (Oral)  Ht $R'5\' 9"'KW$  (1.753 m)  Wt 196 lb 14.4 oz (89.313 kg)  BMI 29.06 kg/m2   Physical Exam  Constitutional: He is oriented to person, place, and time. He appears well-developed and well-nourished.  HENT:  Head: Normocephalic and atraumatic.  Mouth/Throat: Oropharynx is clear and moist.  Eyes: Pupils are equal, round, and reactive to light.  Neck: Normal range of motion. Neck supple.  Cardiovascular: Normal rate and regular rhythm.   No murmur heard. Pulmonary/Chest: Effort normal and breath sounds normal.  Abdominal: Soft. Bowel sounds are normal. There is no tenderness.  Neurological: He is alert and oriented to person, place, and time.  Skin: Skin is warm and dry.  Psychiatric: He has a normal mood and affect.          Assessment & Plan:     ICD-9-CM ICD-10-CM   1. Hyperlipemia 272.4 E78.5 CMP14+EGFR     Lipid panel     atorvastatin (LIPITOR) 40 MG tablet  2. Essential hypertension, benign  401.1 I10 CMP14+EGFR     amLODipine (NORVASC) 10 MG tablet     metoprolol (LOPRESSOR) 50 MG tablet  3. Screening V82.9 Z13.9 POCT CBC     PSA, total and free     Thyroid Panel With TSH  4. Acute idiopathic gout of shoulder, unspecified laterality 274.01 M10.019 allopurinol (ZYLOPRIM) 100 MG tablet     indomethacin (INDOCIN) 50 MG capsule     Uric acid  5. Gastroesophageal reflux disease with esophagitis 530.11 K21.0 pantoprazole (PROTONIX) 40 MG tablet  6. ASCVD (arteriosclerotic cardiovascular disease) 429.2 I25.10 DG Chest 2 View   440.9       Return in about 1 year (around 06/13/2015).  Lysbeth Penner FNP

## 2014-06-12 NOTE — Addendum Note (Signed)
Addended by: Marin Olp on: 06/12/2014 11:10 AM   Modules accepted: Orders

## 2014-06-13 ENCOUNTER — Other Ambulatory Visit: Payer: Self-pay | Admitting: Family Medicine

## 2014-06-13 ENCOUNTER — Telehealth: Payer: Self-pay | Admitting: Family Medicine

## 2014-06-13 DIAGNOSIS — M10019 Idiopathic gout, unspecified shoulder: Secondary | ICD-10-CM

## 2014-06-13 LAB — CMP14+EGFR
ALT: 16 IU/L (ref 0–44)
AST: 20 IU/L (ref 0–40)
Albumin/Globulin Ratio: 1.8 (ref 1.1–2.5)
Albumin: 4.6 g/dL (ref 3.6–4.8)
Alkaline Phosphatase: 142 IU/L — ABNORMAL HIGH (ref 39–117)
BUN/Creatinine Ratio: 10 (ref 10–22)
BUN: 14 mg/dL (ref 8–27)
CO2: 25 mmol/L (ref 18–29)
Calcium: 9.9 mg/dL (ref 8.6–10.2)
Chloride: 98 mmol/L (ref 97–108)
Creatinine, Ser: 1.46 mg/dL — ABNORMAL HIGH (ref 0.76–1.27)
GFR calc Af Amer: 56 mL/min/{1.73_m2} — ABNORMAL LOW (ref >59)
GFR calc non Af Amer: 49 mL/min/{1.73_m2} — ABNORMAL LOW (ref >59)
Globulin, Total: 2.5 g/dL (ref 1.5–4.5)
Glucose: 112 mg/dL — ABNORMAL HIGH (ref 65–99)
Potassium: 5 mmol/L (ref 3.5–5.2)
Sodium: 140 mmol/L (ref 134–144)
Total Bilirubin: 0.8 mg/dL (ref 0.0–1.2)
Total Protein: 7.1 g/dL (ref 6.0–8.5)

## 2014-06-13 LAB — THYROID PANEL WITH TSH
Free Thyroxine Index: 3 (ref 1.2–4.9)
T3 Uptake Ratio: 29 % (ref 24–39)
T4, Total: 10.3 ug/dL (ref 4.5–12.0)
TSH: 1.09 u[IU]/mL (ref 0.450–4.500)

## 2014-06-13 LAB — PSA, TOTAL AND FREE
PSA, Free Pct: 34.8 %
PSA, Free: 0.87 ng/mL
PSA: 2.5 ng/mL (ref 0.0–4.0)

## 2014-06-13 LAB — LIPID PANEL
Chol/HDL Ratio: 2.7 ratio units (ref 0.0–5.0)
Cholesterol, Total: 131 mg/dL (ref 100–199)
HDL: 48 mg/dL (ref >39)
LDL Calculated: 60 mg/dL (ref 0–99)
Triglycerides: 116 mg/dL (ref 0–149)
VLDL Cholesterol Cal: 23 mg/dL (ref 5–40)

## 2014-06-13 LAB — URIC ACID: Uric Acid: 9.3 mg/dL — ABNORMAL HIGH (ref 3.7–8.6)

## 2014-06-13 MED ORDER — ALLOPURINOL 100 MG PO TABS: ORAL_TABLET | ORAL | Status: DC

## 2014-06-14 NOTE — Telephone Encounter (Signed)
Reviewed pt's lab results with him and then discussed the duplicate med orders, pt voiced understanding, will close encounter

## 2014-06-29 DIAGNOSIS — H02834 Dermatochalasis of left upper eyelid: Secondary | ICD-10-CM | POA: Diagnosis not present

## 2014-06-29 DIAGNOSIS — H02831 Dermatochalasis of right upper eyelid: Secondary | ICD-10-CM | POA: Diagnosis not present

## 2014-06-29 DIAGNOSIS — H2513 Age-related nuclear cataract, bilateral: Secondary | ICD-10-CM | POA: Diagnosis not present

## 2014-09-04 DIAGNOSIS — H02831 Dermatochalasis of right upper eyelid: Secondary | ICD-10-CM | POA: Diagnosis not present

## 2014-09-04 DIAGNOSIS — H02834 Dermatochalasis of left upper eyelid: Secondary | ICD-10-CM | POA: Diagnosis not present

## 2014-09-21 ENCOUNTER — Telehealth: Payer: Self-pay

## 2014-09-21 NOTE — Telephone Encounter (Signed)
Phone call from pt.  Stated he is scheduled to have surgery on his eyelids on 10/25/14.  Is questioning if it is okay to hold his Aspirin 10 days prior to surgery.  Discussed with Dr. Trula Slade.  Advised that it will be okay to hold his ASA prior to the scheduled surgery, and to resume as soon as the Opthalmologist gives the approval to get back on it.  Notified pt. Of Dr. Stephens Shire recommendation.  Verb. Understanding.

## 2014-09-24 ENCOUNTER — Telehealth: Payer: Self-pay | Admitting: Family Medicine

## 2014-09-24 NOTE — Telephone Encounter (Signed)
Pt had questions regarding upcoming eye surgery. He was given a list of medications he couldn't take 10 days prior to the surgery and wanted to make sure he wasn't on any of them or if the medications he currently took would have any adverse reaction. Advised pt to call the surgeon that will be doing his eye surgery to verify his medications. Pt voiced understanding.

## 2014-10-25 DIAGNOSIS — H02834 Dermatochalasis of left upper eyelid: Secondary | ICD-10-CM | POA: Diagnosis not present

## 2014-10-25 DIAGNOSIS — H53453 Other localized visual field defect, bilateral: Secondary | ICD-10-CM | POA: Diagnosis not present

## 2014-10-25 DIAGNOSIS — H02831 Dermatochalasis of right upper eyelid: Secondary | ICD-10-CM | POA: Diagnosis not present

## 2014-12-31 ENCOUNTER — Encounter: Payer: Self-pay | Admitting: Family

## 2015-01-01 ENCOUNTER — Encounter: Payer: Self-pay | Admitting: Family

## 2015-01-02 ENCOUNTER — Ambulatory Visit (INDEPENDENT_AMBULATORY_CARE_PROVIDER_SITE_OTHER): Payer: Medicare Other | Admitting: Family

## 2015-01-02 ENCOUNTER — Encounter: Payer: Self-pay | Admitting: Family

## 2015-01-02 ENCOUNTER — Ambulatory Visit (HOSPITAL_COMMUNITY)
Admission: RE | Admit: 2015-01-02 | Discharge: 2015-01-02 | Disposition: A | Discharge status: Home / Self Care | Payer: Medicare Other | Source: Ambulatory Visit | Attending: Family | Admitting: Family

## 2015-01-02 ENCOUNTER — Ambulatory Visit (INDEPENDENT_AMBULATORY_CARE_PROVIDER_SITE_OTHER)
Admission: RE | Admit: 2015-01-02 | Discharge: 2015-01-02 | Disposition: A | Discharge status: Home / Self Care | Payer: Medicare Other | Source: Ambulatory Visit | Attending: Family | Admitting: Family

## 2015-01-02 VITALS — BP 141/69 | HR 57 | Resp 14 | Ht 70.0 in | Wt 202.0 lb

## 2015-01-02 DIAGNOSIS — Z87448 Personal history of other diseases of urinary system: Secondary | ICD-10-CM

## 2015-01-02 DIAGNOSIS — Z8679 Personal history of other diseases of the circulatory system: Secondary | ICD-10-CM

## 2015-01-02 DIAGNOSIS — Z48812 Encounter for surgical aftercare following surgery on the circulatory system: Secondary | ICD-10-CM

## 2015-01-02 DIAGNOSIS — Z87891 Personal history of nicotine dependence: Secondary | ICD-10-CM | POA: Diagnosis not present

## 2015-01-02 DIAGNOSIS — I701 Atherosclerosis of renal artery: Secondary | ICD-10-CM

## 2015-01-02 DIAGNOSIS — Z9889 Other specified postprocedural states: Secondary | ICD-10-CM

## 2015-01-02 DIAGNOSIS — I6523 Occlusion and stenosis of bilateral carotid arteries: Secondary | ICD-10-CM | POA: Insufficient documentation

## 2015-01-02 DIAGNOSIS — Z959 Presence of cardiac and vascular implant and graft, unspecified: Secondary | ICD-10-CM

## 2015-01-02 NOTE — Patient Instructions (Signed)
Stroke Prevention Some medical conditions and behaviors are associated with an increased chance of having a stroke. You may prevent a stroke by making healthy choices and managing medical conditions. HOW CAN I REDUCE MY RISK OF HAVING A STROKE?   Stay physically active. Get at least 30 minutes of activity on most or all days.  Do not smoke. It may also be helpful to avoid exposure to secondhand smoke.  Limit alcohol use. Moderate alcohol use is considered to be:  No more than 2 drinks per day for men.  No more than 1 drink per day for nonpregnant women.  Eat healthy foods. This involves:  Eating 5 or more servings of fruits and vegetables a day.  Making dietary changes that address high blood pressure (hypertension), high cholesterol, diabetes, or obesity.  Manage your cholesterol levels.  Making food choices that are high in fiber and low in saturated fat, trans fat, and cholesterol may control cholesterol levels.  Take any prescribed medicines to control cholesterol as directed by your health care provider.  Manage your diabetes.  Controlling your carbohydrate and sugar intake is recommended to manage diabetes.  Take any prescribed medicines to control diabetes as directed by your health care provider.  Control your hypertension.  Making food choices that are low in salt (sodium), saturated fat, trans fat, and cholesterol is recommended to manage hypertension.  Take any prescribed medicines to control hypertension as directed by your health care provider.  Maintain a healthy weight.  Reducing calorie intake and making food choices that are low in sodium, saturated fat, trans fat, and cholesterol are recommended to manage weight.  Stop drug abuse.  Avoid taking birth control pills.  Talk to your health care provider about the risks of taking birth control pills if you are over 35 years old, smoke, get migraines, or have ever had a blood clot.  Get evaluated for sleep  disorders (sleep apnea).  Talk to your health care provider about getting a sleep evaluation if you snore a lot or have excessive sleepiness.  Take medicines only as directed by your health care provider.  For some people, aspirin or blood thinners (anticoagulants) are helpful in reducing the risk of forming abnormal blood clots that can lead to stroke. If you have the irregular heart rhythm of atrial fibrillation, you should be on a blood thinner unless there is a good reason you cannot take them.  Understand all your medicine instructions.  Make sure that other conditions (such as anemia or atherosclerosis) are addressed. SEEK IMMEDIATE MEDICAL CARE IF:   You have sudden weakness or numbness of the face, arm, or leg, especially on one side of the body.  Your face or eyelid droops to one side.  You have sudden confusion.  You have trouble speaking (aphasia) or understanding.  You have sudden trouble seeing in one or both eyes.  You have sudden trouble walking.  You have dizziness.  You have a loss of balance or coordination.  You have a sudden, severe headache with no known cause.  You have new chest pain or an irregular heartbeat. Any of these symptoms may represent a serious problem that is an emergency. Do not wait to see if the symptoms will go away. Get medical help at once. Call your local emergency services (911 in [place] Do not drive yourself to the hospital. Document Released: 08/20/2004 Document Revised: 11/27/2013 Document Reviewed: 01/13/2013 [employer] Patient Information 2015 [employer]. This information is not intended to replace advice given   to you by your health care provider. Make sure you discuss any questions you have with your health care provider.  Renal Artery Stenosis Renal artery stenosis (RAS) is the narrowing of the artery that supplies blood to the kidney. If the narrowing is critical and the kidney does not get enough blood, hypertension (high  blood pressure) can develop. This is called renal vascular hypertension (RVH). This is a common, uncommon cause of secondary hypertension. It does not usually happen until there is at least a 70% narrowing of the artery. Decreased blood flow through the renal artery causes the kidney to release increased amounts of a hormone. It is called renin. Renin is a strong blood pressure regulator. When it is high, it causes changes that lead to hypertension. Eventually the kidney not receiving enough blood may shrink in size and become less useful. The high blood pressure that is produced can eventually damage and destroy the remaining kidney. This is called hypertensive nephrosclerosis. If both kidneys fail, it will lead to chronic renal failure.  CAUSES  Most renal artery stenosis is caused by a hardening of the arteries (atherosclerosis). This is called Atherosclerotic Renal Artery Stenosis (AS-RAS). It is caused by a build-up of cholesterol (plaques) on the inner lining of the renal artery. A much less common cause is Fibromuscular Dysplasia (FMD). With it, there is an abnormality in the muscular lining of the renal artery. FMD-RAS occurs almost exclusively in women aged 58 to 33. It rarely affects African Americans or Asians.  SYMPTOMS  Often high blood pressure is discovered on a routine blood pressure check. It may be the only sign that something is wrong. Other problems that may occur are: You may develop calf pain when walking. This is called intermittent claudication. It may be a sign of bad circulation in the legs. Inability to use certain blood pressure pills such as angiotensin-I (ACE-I) inhibitors or angiotensin receptor blockers (ARB's). These could cause sudden drops in blood pressure with worsening of kidney function. More than three antihypertensive medications may be needed for blood pressure control. New onset of high blood pressure if you are over 55. DIAGNOSIS  Your caregiver may find  suggestions of this on exam if he finds bruits (like murmurs) on listening to your abdomen (belly) or the large arteries in your neck. Your caregiver may also suspect this there is a sudden worsening of your blood pressure when it has been well controlled and you are over age 84. Additional testing that may be done includes: One diagnostic method used for renal artery stenosis (RAS) is to measure and compare the level of renin, (blood pressure-regulating hormone released by the kidneys), in the right to the left renal veins. If the amount of renin released by one-side is markedly higher than the other, this identifies a high renin-releasing kidney consistent with RAS. FMD-RAS is often found on renal scan with ACE-inhibitor challenge, or ultrasound with Doppler. FMD responds well to angioplasty and stenting. The results of stenting in FMD are usually long lasting. RISK FACTORS  Most renal artery stenosis is caused by a hardening of the arteries. This is called atherosclerosis. Other risk factors associated with the development of atherosclerotic RAS include the following:  Carotid artery disease. Obesity. High blood pressure. Heredity. Old age. Fibromuscular dysplasia. Diabetes mellitus. Smoking. Hardening of the arteries. TREATMENT  Renal vascular hypertension can be very severe. It can also be difficult to control. Medication is used to control high blood pressure (hypertension). Blood pressure medications that directly affect  the renin angiotensin pathway can be used toe help control blood pressure. ACE inhibitors and angiotensin receptor blockers (ARB's) are often effective in patients with unilateral RAS. In some cases, patients with RAS are resistant to these medications. In patients with bilateral RAS, these medications must be used carefully. They may cause acute renal failure (ARF). If acute renal failure develops (if creatinine increases by more than 30%), the medication is discontinued.  The patient is evaluated for bilateral RAS. Angioplasty and stenting may be used to improve blood flow. The goal is to improve the circulation of blood flow to the kidney and prevent the release of excess renin, which can help to decrease blood pressure. This helps to prevent atrophy of the kidney. In general, patients with AS-RAS should have stenting done. This is because plasty by itself has a high incidence of re-stenosis. Surgery to bypass the narrowing may be done. If the kidney with RAS has diminished in size or strength (atrophied ), surgical removal of the kidney may be advised. This is called nephrectomy. Document Released: 04/08/2005 Document Revised: 10/05/2011 Document Reviewed: 10/26/2013 [facility] Patient Information 2015 [place], [place]. This information is not intended to replace advice given to you by your health care provider. Make sure you discuss any questions you have with your health care provider.

## 2015-01-02 NOTE — Progress Notes (Signed)
Established Carotid Patient   History of Present Illness  James [name] is a 69 y.o. male patient of Dr. Trula Slade. The patient is back today for followup. He is status post left carotid endarterectomy for asymptomatic stenosis in November of 2013. He is status post right carotid endarterectomy in 2008. He has also undergone a right renal artery stenting. Pt states his blood pressure usually is 140's/75, but is higher in a medical office.  Patient has Negative history of TIA or stroke symptom. The patient denies amaurosis fugax or monocular blindness. The patient denies facial drooping. Pt. denies hemiplegia. The patient denies receptive or expressive aphasia. Pt. denies extremity weakness. Pt denies claudication symptoms with walking, denies non healing wounds.  Pt denies New Medical or Surgical History.  Pt Diabetic: No Pt smoker: former smoker, quit in 2001 when he had a partial small bowel resection for GI bleed  Pt meds include: Statin : Yes ASA: Yes Other anticoagulants/antiplatelets: no  Past Medical History  Diagnosis Date  . Arthritis   . Hyperlipidemia     takes Lipitor daily  . H/O: GI bleed   . Carotid artery occlusion   . H/O renal artery stenosis   . DVT (deep venous thrombosis) 2008  . Hypertension     takes Metoprolol and Amlodipine daily  . Joint swelling   . Joint pain   . Gout     takes Allopurinol daily and Indomethacin prn  . GERD (gastroesophageal reflux disease)     takes Protonix daily  . History of gastric ulcer   . History of colon polyps   . History of blood transfusion   . Gout flare   . DVT (deep venous thrombosis)     Social History History  Substance Use Topics  . Smoking status: Former Smoker    Quit date: 01/02/2000  . Smokeless tobacco: Former Systems developer    Quit date: 12/26/1999  . Alcohol Use: No     Comment: Quit in 1998    Family History Family History  Problem Relation Age of Onset  . Hypertension Mother   .  Hyperlipidemia Mother   . Other Mother     varicose veins  . Hyperlipidemia Father   . Hypertension Father   . Diabetes Father   . Clotting disorder Father   . Deep vein thrombosis Father   . Hypertension Sister   . Diabetes Sister   . Hyperlipidemia Sister   . Varicose Veins Sister   . Other Sister     Bleeding problems  . Hypertension Brother   . Hyperlipidemia Brother     Surgical History Past Surgical History  Procedure Laterality Date  . Renal artery stent  2009    Right renal artery stenting-  Right Kidney  . Carotid endarterectomy  2008    right CEA  . Small intestine removed  2001    d/t random gi bleeding  . Esophagogastroduodenoscopy    . Cholecystectomy  mid 90's  . Colonoscopy    . Hernia repair      umbilical hernia  . Endarterectomy  06/02/2012    Procedure: ENDARTERECTOMY CAROTID;  Surgeon: Serafina Mitchell, MD;  Location: [facility];  Service: Vascular;  Laterality: Left;  . Patch angioplasty  06/02/2012    Procedure: PATCH ANGIOPLASTY;  Surgeon: Serafina Mitchell, MD;  Location: [facility];  Service: Vascular;  Laterality: Left;  using Vascu-Guard Patch  . Colon surgery      Allergies  Allergen Reactions  . Plavix [Clopidogrel  Bisulfate] Other (See Comments)    GI Bleed    Current Outpatient Prescriptions  Medication Sig Dispense Refill  . allopurinol (ZYLOPRIM) 100 MG tablet Take 2 po qd 60 tablet 11  . amLODipine (NORVASC) 10 MG tablet TAKE 1 TABLET DAILY 90 tablet 3  . aspirin 81 MG tablet Take 81 mg by mouth 2 (two) times daily.     Marland Kitchen atorvastatin (LIPITOR) 40 MG tablet TAKE 1 TABLET DAILY 90 tablet 3  . indomethacin (INDOCIN) 50 MG capsule PRN 30 capsule 3  . metoprolol (LOPRESSOR) 50 MG tablet Take 1 tablet (50 mg total) by mouth daily. 90 tablet 3  . pantoprazole (PROTONIX) 40 MG tablet TAKE 1 TABLET DAILY 90 tablet 3   No current facility-administered medications for this visit.    Review of Systems : See HPI for pertinent positives and  negatives.  Physical Examination  Filed Vitals:   01/02/15 1008 01/02/15 1013  BP: 158/78 141/69  Pulse: 58 57  Resp:  14  Height:  5\' 10"  (1.778 m)  Weight:  202 lb (91.627 kg)  SpO2:  98%   Body mass index is 28.98 kg/(m^2).  General: WDWN male in NAD GAIT: normal Eyes: PERRLA Pulmonary: Non-labored, CTAB, Negative Rales, Negative rhonchi, & Negative wheezing.  Cardiac: regular Rhythm , Negative detected murmur.  VASCULAR EXAM Carotid Bruits Left Right   Negative Negative   Aorta is not palpable. Radial pulses are 2+ palpable and equal.      LE Pulses LEFT RIGHT   POPLITEAL not palpable  not palpable   POSTERIOR TIBIAL not palpable  not palpable    DORSALIS PEDIS  ANTERIOR TIBIAL 2+ palpable  2+ palpable     Gastrointestinal: soft, nontender, BS WNL, no r/g,no palpable masses.  Musculoskeletal: Negative muscle atrophy/wasting. M/S 5/5 throughout, Extremities without ischemic changes.  Neurologic: A&O X 3; Appropriate Affect, Speech is normal CN 2-12 intact, Pain and light touch intact in extremities, Motor exam as listed above.        Non-Invasive Vascular Imaging CAROTID DUPLEX 01/02/2015   CEREBROVASCULAR DUPLEX EVALUATION    INDICATION: Carotid artery disease    PREVIOUS INTERVENTION(S): Right carotid endarterectomy 04/21/2007; Left carotid endarterectomy 06/02/2012    DUPLEX EXAM: Carotid duplex    RIGHT  LEFT  Peak Systolic Velocities (cm/s) End Diastolic Velocities (cm/s) Plaque LOCATION Peak Systolic Velocities (cm/s) End Diastolic Velocities (cm/s) Plaque  123 26 - CCA PROXIMAL 123 19 -  116 26 - CCA MID 123 24 -  116 17 HM CCA DISTAL 122 22 HM  116 17 - ECA 146 18 HT  77 16 - ICA PROXIMAL 68 18 -  97 26 - ICA MID 85 28 -  81 28 - ICA DISTAL 77 25 -    N/A ICA / CCA  Ratio (PSV) N/A  Antegrade Vertebral Flow Antegrade  [id number] Brachial Systolic Pressure (mmHg) [id number]  Triphasic Brachial Artery Waveforms Triphasic    Plaque Morphology:  HM = Homogeneous, HT = Heterogeneous, CP = Calcific Plaque, SP = Smooth Plaque, IP = Irregular Plaque  ADDITIONAL FINDINGS:     IMPRESSION: 1. Patent bilateral carotid endartectomy sites with less than 49%  internal carotid artery stenosis    Compared to the previous exam:  No significant change since 01/01/2014    RENAL ARTERY DUPLEX EVALUATION    INDICATION: Renal artery stenosis    PREVIOUS INTERVENTION(S): Right renal artery stent placed 02/08/2008    DUPLEX EXAM:     AORTA Peak Systolic Velocity (  cm/s): 77    RIGHT  LEFT   Peak Systolic Velocities (cm/s) Comments  Peak Systolic Velocities (cm/s) Comments  -  Renal Artery Origin/Proximal 121   50  Renal Artery Mid 147   158  Renal Artery Distal 96   -  Accessory Renal Artery (when present) -   N/A  Renal / Aortic Ratio (RAR) 1.9  9.8 Kidney Size (cm) 11.0  Phasic  Renal Vein Phasic    Peak Systolic Velocities (cm/s) Comments  Renal Artery Stent (  ) Peak Systolic Velocities (cm/s) Comments  >245  Proximal    >245  Mid    >274  Distal      ADDITIONAL FINDINGS: 1.3 x 1.2 cm right kidney cyst; 1.9 x 1.7 left kidney cyst.    IMPRESSION: 1. Patent right renal artery stent with velocities in the 60 - 99% range 2. 1 - 59% left renal artery stenosis. 3. Technically difficult exam due to body habitus and bowel gas.    Compared to the previous exam:  No significant change      Assessment: MYRNA MAJID is a 69 y.o. male who is status post left carotid endarterectomy for asymptomatic stenosis in November of 2013. He is status post right carotid endarterectomy in 2008. He has also undergone a right renal artery stenting. Pt states his blood pressure usually is 140's/75, but is higher in a medical office. Pt denies any history of stroke or TIA. Today's  carotid Duplex suggests bilateral carotid endartectomy sites with less than 49%  internal carotid artery stenosis; no significant change since 01/01/2014.  Today's renal artery Duplex suggests right renal artery stent with velocities in the 60 - 99% range and 1 - 59% left renal artery stenosis. Technically difficult exam due to body habitus and bowel gas. Right renal stent velocities are >245 and >274; discussed with Dr. Oneida Alar.  No serum creatinine recent results on file. Pt states his blood pressure has been stable, no new blood pressure medications added, he takes 2 antihypertensive medications now.  Pt will return in 3 months with repeat right renal stent Duplex and see Dr. Trula Slade. He next sees his PCP in possibly September 2016. If his serum creatinine shows a steady increase, and/or his blood pressure becomes more difficult to control, these would be possible indications for intervention and re intervention of renal arteries.   Face to face time with patient was 25 minutes. Over 50% of this time was spent on counseling and coordination of care.   Plan: Follow-up in 3 months with right renal artery stent evaluation and see Dr. Trula Slade, and carotid Duplex in a year.   I discussed in depth with the patient the nature of atherosclerosis, and emphasized the importance of maximal medical management including strict control of blood pressure, blood glucose, and lipid levels, obtaining regular exercise, and continued cessation of smoking.  The patient is aware that without maximal medical management the underlying atherosclerotic disease process will progress, limiting the benefit of any interventions. The patient was given information about stroke prevention and what symptoms should prompt the patient to seek immediate medical care. Thank you for allowing [place] to participate in this patient's care.  Clemon Chambers, RN, MSN, FNP-C [facility] Office:  [phone]  Clinic Physician: Scot Dock  01/02/2015 10:25 AM

## 2015-01-04 NOTE — Addendum Note (Signed)
Addended by: Mena Goes on: 01/04/2015 04:34 PM   Modules accepted: Orders

## 2015-01-17 ENCOUNTER — Ambulatory Visit (INDEPENDENT_AMBULATORY_CARE_PROVIDER_SITE_OTHER): Payer: Medicare Other | Admitting: Nurse Practitioner

## 2015-01-17 ENCOUNTER — Encounter: Payer: Self-pay | Admitting: Nurse Practitioner

## 2015-01-17 DIAGNOSIS — M545 Low back pain, unspecified: Secondary | ICD-10-CM

## 2015-01-17 MED ORDER — PREDNISONE 20 MG PO TABS: ORAL_TABLET | ORAL | Status: DC

## 2015-01-17 MED ORDER — CYCLOBENZAPRINE HCL 5 MG PO TABS: 5.0000 mg | ORAL_TABLET | Freq: Three times a day (TID) | ORAL | Status: DC | PRN

## 2015-01-17 NOTE — Patient Instructions (Signed)
Back Pain, Adult Low back pain is very common. About 1 in 5 people have back pain.The cause of low back pain is rarely dangerous. The pain often gets better over time.About half of people with a sudden onset of back pain feel better in just 2 weeks. About 8 in 10 people feel better by 6 weeks.  CAUSES Some common causes of back pain include:  Strain of the muscles or ligaments supporting the spine.  Wear and tear (degeneration) of the spinal discs.  Arthritis.  Direct injury to the back. DIAGNOSIS Most of the time, the direct cause of low back pain is not known.However, back pain can be treated effectively even when the exact cause of the pain is unknown.Answering your caregiver's questions about your overall health and symptoms is one of the most accurate ways to make sure the cause of your pain is not dangerous. If your caregiver needs more information, he or she may order lab work or imaging tests (X-rays or MRIs).However, even if imaging tests show changes in your back, this usually does not require surgery. HOME CARE INSTRUCTIONS For many people, back pain returns.Since low back pain is rarely dangerous, it is often a condition that people can learn to manageon their own.   Remain active. It is stressful on the back to sit or stand in one place. Do not sit, drive, or stand in one place for more than 30 minutes at a time. Take short walks on level surfaces as soon as pain allows.Try to increase the length of time you walk each day.  Do not stay in bed.Resting more than 1 or 2 days can delay your recovery.  Do not avoid exercise or work.Your body is made to move.It is not dangerous to be active, even though your back may hurt.Your back will likely heal faster if you return to being active before your pain is gone.  Pay attention to your body when you bend and lift. Many people have less discomfortwhen lifting if they bend their knees, keep the load close to their bodies,and  avoid twisting. Often, the most comfortable positions are those that put less stress on your recovering back.  Find a comfortable position to sleep. Use a firm mattress and lie on your side with your knees slightly bent. If you lie on your back, put a pillow under your knees.  Only take over-the-counter or prescription medicines as directed by your caregiver. Over-the-counter medicines to reduce pain and inflammation are often the most helpful.Your caregiver may prescribe muscle relaxant drugs.These medicines help dull your pain so you can more quickly return to your normal activities and healthy exercise.  Put ice on the injured area.  Put ice in a plastic bag.  Place a towel between your skin and the bag.  Leave the ice on for 15-20 minutes, 03-04 times a day for the first 2 to 3 days. After that, ice and heat may be alternated to reduce pain and spasms.  Ask your caregiver about trying back exercises and gentle massage. This may be of some benefit.  Avoid feeling anxious or stressed.Stress increases muscle tension and can worsen back pain.It is important to recognize when you are anxious or stressed and learn ways to manage it.Exercise is a great option. SEEK MEDICAL CARE IF:  You have pain that is not relieved with rest or medicine.  You have pain that does not improve in 1 week.  You have new symptoms.  You are generally not feeling well. SEEK   IMMEDIATE MEDICAL CARE IF:   You have pain that radiates from your back into your legs.  You develop new bowel or bladder control problems.  You have unusual weakness or numbness in your arms or legs.  You develop nausea or vomiting.  You develop abdominal pain.  You feel faint. Document Released: 07/13/2005 Document Revised: 01/12/2012 Document Reviewed: 11/14/2013 [employer] Patient Information 2015 [employer]. This information is not intended to replace advice given to you by your health care provider. Make sure you  discuss any questions you have with your health care provider.  

## 2015-01-17 NOTE — Progress Notes (Signed)
   Subjective:    Patient ID: James [name], male    DOB: Sep 11, 1945, 69 y.o.   MRN: [id number]  HPI Patient in today c/o low back pain that started 4 days ago. Denies injury- but says that he was lifting heavy material the day before it started. Pain in constant on left side- does not radiate down leg. Rates pain 7-8/10 and is intermittent. Certain movements can increase pain. Rising from sitting also increases pain- pain improves when he gets on a comfortable position. Denies any urinary symptoms.    Review of Systems  Constitutional: Negative.   HENT: Negative.   Respiratory: Negative.   Cardiovascular: Negative.   Gastrointestinal: Negative.   Genitourinary: Negative.  Negative for dysuria, urgency and frequency.  Neurological: Negative.   Psychiatric/Behavioral: Negative.   All other systems reviewed and are negative.      Objective:   Physical Exam  Constitutional: He is oriented to person, place, and time. He appears well-developed and well-nourished.  Cardiovascular: Normal rate, regular rhythm and normal heart sounds.   Pulmonary/Chest: Effort normal and breath sounds normal.  Musculoskeletal:  Decrease ROM of lumbar spine with pain on rotation to the right (-) SLR bil Motor strength and sensation distally intact  Neurological: He is alert and oriented to person, place, and time.  Skin: Skin is warm.  Psychiatric: He has a normal mood and affect. His behavior is normal. Judgment and thought content normal.   BP 164/84 mmHg  Pulse 73  Temp(Src) 96.8 F (36 C) (Oral)  Ht 5\' 10"  (1.778 m)  Wt 200 lb (90.719 kg)  BMI 28.70 kg/m2        Assessment & Plan:  1. Left-sided low back pain without sciatica Moist heat  To area No heavy lifting RTO prn Sedation precautions with flexeril - cyclobenzaprine (FLEXERIL) 5 MG tablet; Take 1 tablet (5 mg total) by mouth 3 (three) times daily as needed for muscle spasms.  Dispense: 30 tablet; Refill: 1 - predniSONE  (DELTASONE) 20 MG tablet; 2 po at sametime daily for 5 days  Dispense: 10 tablet; Refill: 0  Mary-Margaret Hassell Done, FNP

## 2015-03-31 ENCOUNTER — Other Ambulatory Visit: Payer: Self-pay | Admitting: Family Medicine

## 2015-04-02 NOTE — Telephone Encounter (Signed)
no more refills without being seen  

## 2015-04-02 NOTE — Telephone Encounter (Signed)
Last seen 06/12/14  [facility] [place]  Requesting 90 day supply

## 2015-04-08 ENCOUNTER — Ambulatory Visit: Payer: Medicare Other | Admitting: Surgery

## 2015-04-08 ENCOUNTER — Encounter (HOSPITAL_COMMUNITY): Payer: Medicare Other

## 2015-04-25 ENCOUNTER — Encounter: Payer: Self-pay | Admitting: Surgery

## 2015-04-29 ENCOUNTER — Ambulatory Visit (INDEPENDENT_AMBULATORY_CARE_PROVIDER_SITE_OTHER): Payer: Medicare Other | Admitting: Surgery

## 2015-04-29 ENCOUNTER — Encounter: Payer: Self-pay | Admitting: Surgery

## 2015-04-29 ENCOUNTER — Ambulatory Visit (HOSPITAL_COMMUNITY)
Admission: RE | Admit: 2015-04-29 | Discharge: 2015-04-29 | Disposition: A | Discharge status: Home / Self Care | Payer: Medicare Other | Source: Ambulatory Visit | Attending: Family | Admitting: Family

## 2015-04-29 VITALS — BP 179/84 | HR 66 | Ht 70.0 in | Wt 196.1 lb

## 2015-04-29 DIAGNOSIS — I739 Peripheral vascular disease, unspecified: Secondary | ICD-10-CM

## 2015-04-29 DIAGNOSIS — Z959 Presence of cardiac and vascular implant and graft, unspecified: Secondary | ICD-10-CM | POA: Insufficient documentation

## 2015-04-29 DIAGNOSIS — I701 Atherosclerosis of renal artery: Secondary | ICD-10-CM | POA: Diagnosis not present

## 2015-04-29 NOTE — Addendum Note (Signed)
Addended by: Dorthula Rue L on: 04/29/2015 04:06 PM   Modules accepted: Orders

## 2015-04-29 NOTE — Progress Notes (Signed)
Patient name: James [name] MRN: [id number] DOB: [birth year] Sex: male     Chief Complaint  Patient presents with  . Re-evaluation    3 month f/u     HISTORY OF PRESENT ILLNESS: The patient is back today for followup. He is status post left carotid endarterectomy for asymptomatic stenosis in November of 2013. He is status post right carotid endarterectomy in 2008. He has also undergone a right renal artery stenting.  His blood pressure has been trending up.  His creatinine to 2 years ago was 1.2.  Last year was 1.4.  He is scheduled for repeat labs next month.  He has had ultrasound studies which have shown elevated velocities within his right renal artery stent, however visualization was limited secondary to body habitus.  Past Medical History  Diagnosis Date  . Arthritis   . Hyperlipidemia     takes Lipitor daily  . H/O: GI bleed   . Carotid artery occlusion   . H/O renal artery stenosis   . DVT (deep venous thrombosis) ([place]) 2008  . Hypertension     takes Metoprolol and Amlodipine daily  . Joint swelling   . Joint pain   . Gout     takes Allopurinol daily and Indomethacin prn  . GERD (gastroesophageal reflux disease)     takes Protonix daily  . History of gastric ulcer   . History of colon polyps   . History of blood transfusion   . Gout flare   . DVT (deep venous thrombosis) [facility])     Past Surgical History  Procedure Laterality Date  . Renal artery stent  2009    Right renal artery stenting-  Right Kidney  . Carotid endarterectomy  2008    right CEA  . Small intestine removed  2001    d/t random gi bleeding  . Esophagogastroduodenoscopy    . Cholecystectomy  mid 90's  . Colonoscopy    . Hernia repair      umbilical hernia  . Endarterectomy  06/02/2012    Procedure: ENDARTERECTOMY CAROTID;  Surgeon: Serafina Mitchell, MD;  Location: [facility];  Service: Vascular;  Laterality: Left;  . Patch angioplasty  06/02/2012    Procedure: PATCH ANGIOPLASTY;  Surgeon:  Serafina Mitchell, MD;  Location: [place];  Service: Vascular;  Laterality: Left;  using Vascu-Guard Patch  . Colon surgery      Social History   Social History  . Marital Status: Married    Spouse Name: N/A  . Number of Children: N/A  . Years of Education: N/A   Occupational History  . Not on file.   Social History Main Topics  . Smoking status: Former Smoker    Quit date: 01/02/2000  . Smokeless tobacco: Former Systems developer    Quit date: 12/26/1999  . Alcohol Use: No     Comment: Quit in 1998  . Drug Use: No  . Sexual Activity: Yes   Other Topics Concern  . Not on file   Social History Narrative    Family History  Problem Relation Age of Onset  . Hypertension Mother   . Hyperlipidemia Mother   . Other Mother     varicose veins  . Hyperlipidemia Father   . Hypertension Father   . Diabetes Father   . Clotting disorder Father   . Deep vein thrombosis Father   . Hypertension Sister   . Diabetes Sister   . Hyperlipidemia Sister   . Varicose Veins Sister   .  Other Sister     Bleeding problems  . Hypertension Brother   . Hyperlipidemia Brother     Allergies as of 04/29/2015 - Review Complete 04/29/2015  Allergen Reaction Noted  . Plavix [clopidogrel bisulfate] Other (See Comments) 10/06/2012    Current Outpatient Prescriptions on File Prior to Visit  Medication Sig Dispense Refill  . allopurinol (ZYLOPRIM) 100 MG tablet Take 2 po qd 60 tablet 11  . amLODipine (NORVASC) 10 MG tablet TAKE 1 TABLET DAILY 90 tablet 3  . aspirin 81 MG tablet Take 81 mg by mouth 2 (two) times daily.     Marland Kitchen atorvastatin (LIPITOR) 40 MG tablet TAKE 1 TABLET DAILY 90 tablet 3  . indomethacin (INDOCIN) 50 MG capsule PRN 30 capsule 3  . metoprolol (LOPRESSOR) 50 MG tablet TAKE 1 TABLET DAILY 90 tablet 0  . pantoprazole (PROTONIX) 40 MG tablet TAKE 1 TABLET DAILY 90 tablet 3  . cyclobenzaprine (FLEXERIL) 5 MG tablet Take 1 tablet (5 mg total) by mouth 3 (three) times daily as needed for muscle  spasms. (Patient not taking: Reported on 04/29/2015) 30 tablet 1  . predniSONE (DELTASONE) 20 MG tablet 2 po at sametime daily for 5 days (Patient not taking: Reported on 04/29/2015) 10 tablet 0   No current facility-administered medications on file prior to visit.     REVIEW OF SYSTEMS: No changes from visit this summer.  PHYSICAL EXAMINATION:   Vital signs are  Filed Vitals:   04/29/15 0955 04/29/15 0957  BP: 187/80 179/84  Pulse: 66   Height: 5\' 10"  (1.778 m)   Weight: 196 lb 1.6 oz (88.95 kg)   SpO2: 98%    Body mass index is 28.14 kg/(m^2). General: The patient appears their stated age. HEENT:  No gross abnormalities Pulmonary:  Non labored breathing Abdomen: Soft and non-tender Musculoskeletal: There are no major deformities. Neurologic: No focal weakness or paresthesias are detected, Skin: There are no ulcer or rashes noted. Psychiatric: The patient has normal affect.   Diagnostic Studies Patient had a repeat renal artery ultrasound today which was again a technically limited study there do appear to be elevated velocities on the right in the 60-79% category.  There is also velocity elevation within the left common iliac artery  Assessment: Renal artery stenosis Plan: I discussed with the patient that he needs to contact my office after the results of his lab work have returned.  If his creatinine has increased, he will need to undergo angiography with possible intervention on the right renal artery stent.  If he does undergo angiography, I would like to evaluate the stenosis within the left common iliac artery detected on ultrasound today which is asymptomatic.  I have scheduled the patient for return follow-up in 6 months with a renal artery duplex.  Eldridge Abrahams, M.D. [facility] Office: [phone] Pager:  [phone]

## 2015-04-29 NOTE — Addendum Note (Signed)
Addended by: Mena Goes on: 04/29/2015 03:08 PM   Modules accepted: Orders

## 2015-04-29 NOTE — Addendum Note (Signed)
Addended by: Dorthula Rue L on: 04/29/2015 12:08 PM   Modules accepted: Orders

## 2015-04-30 ENCOUNTER — Other Ambulatory Visit: Payer: Self-pay | Admitting: Family Medicine

## 2015-05-14 ENCOUNTER — Encounter: Payer: Self-pay | Admitting: Family Medicine

## 2015-06-10 IMAGING — CR DG CHEST 2V
2 series · 2 of 2 positions shown · non-contrast
Comparison: October 11, 2012.

CLINICAL DATA: Atherosclerotic cardiovascular disease.

EXAM:
CHEST  2 VIEW

[view not recorded (1 of 2)]
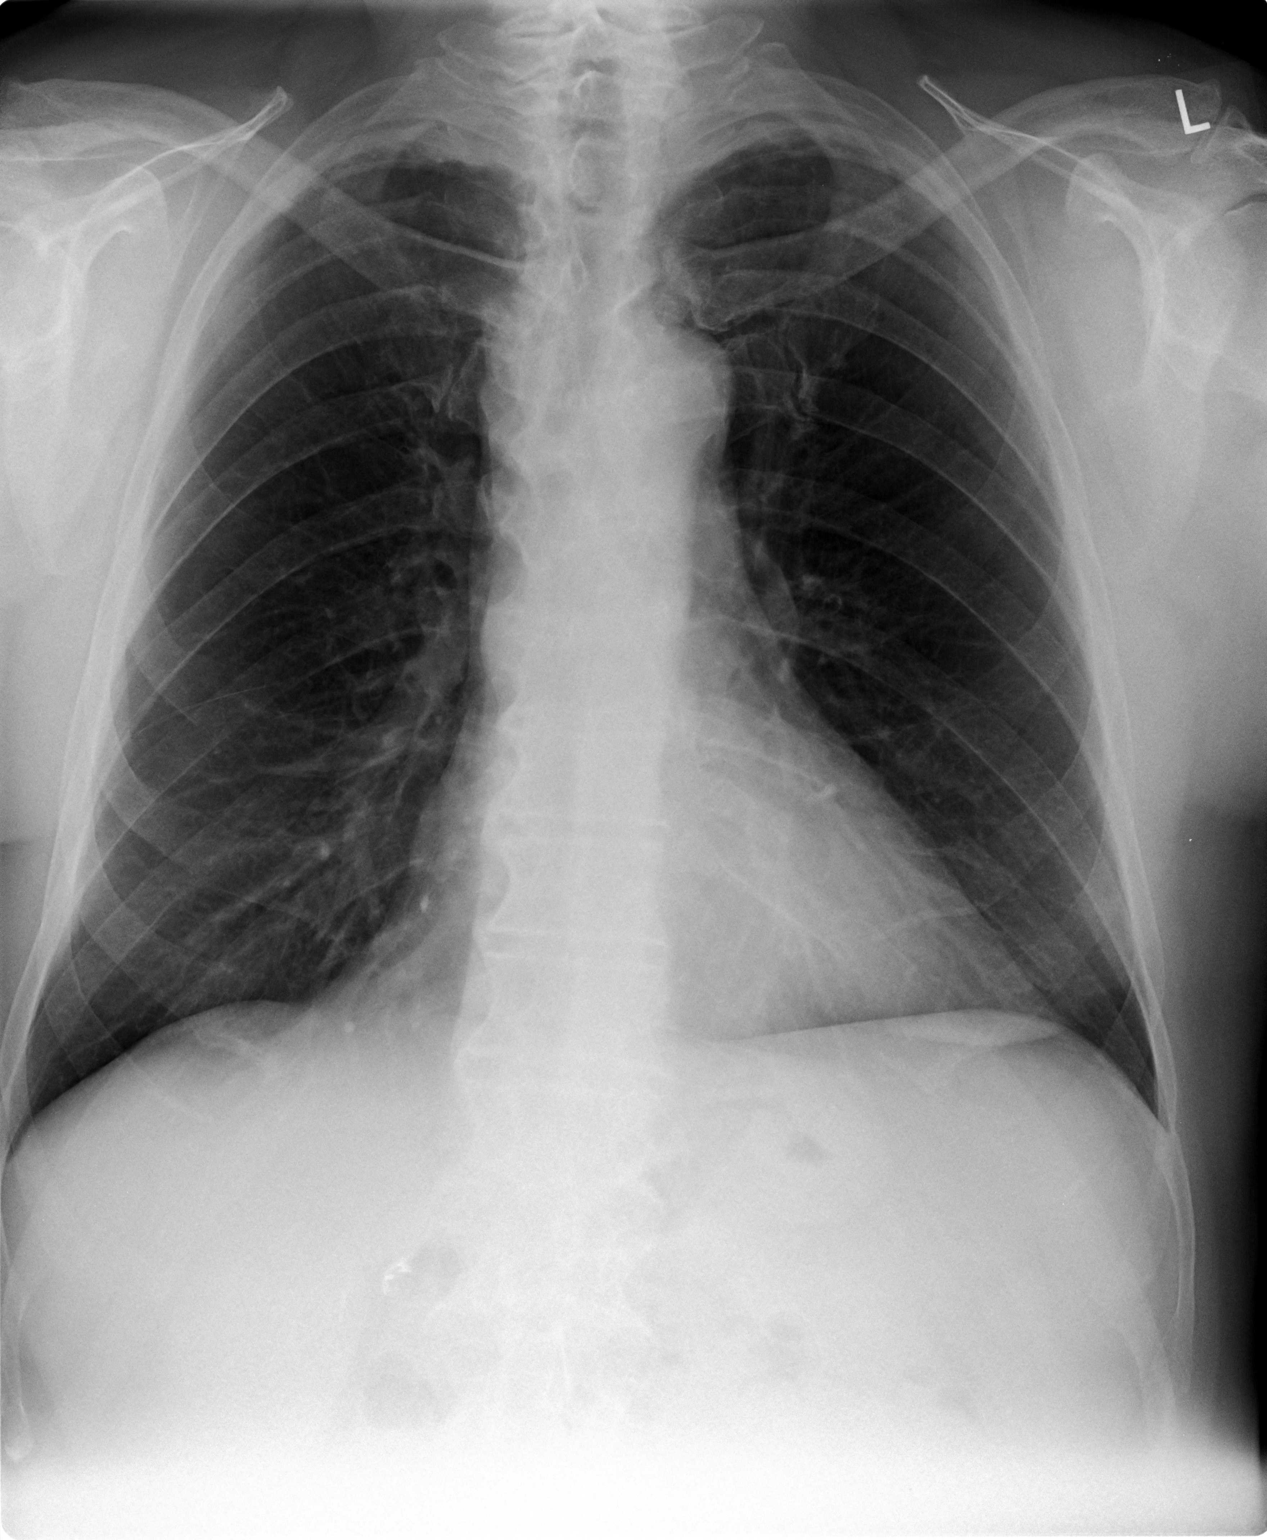

[view not recorded (2 of 2)]
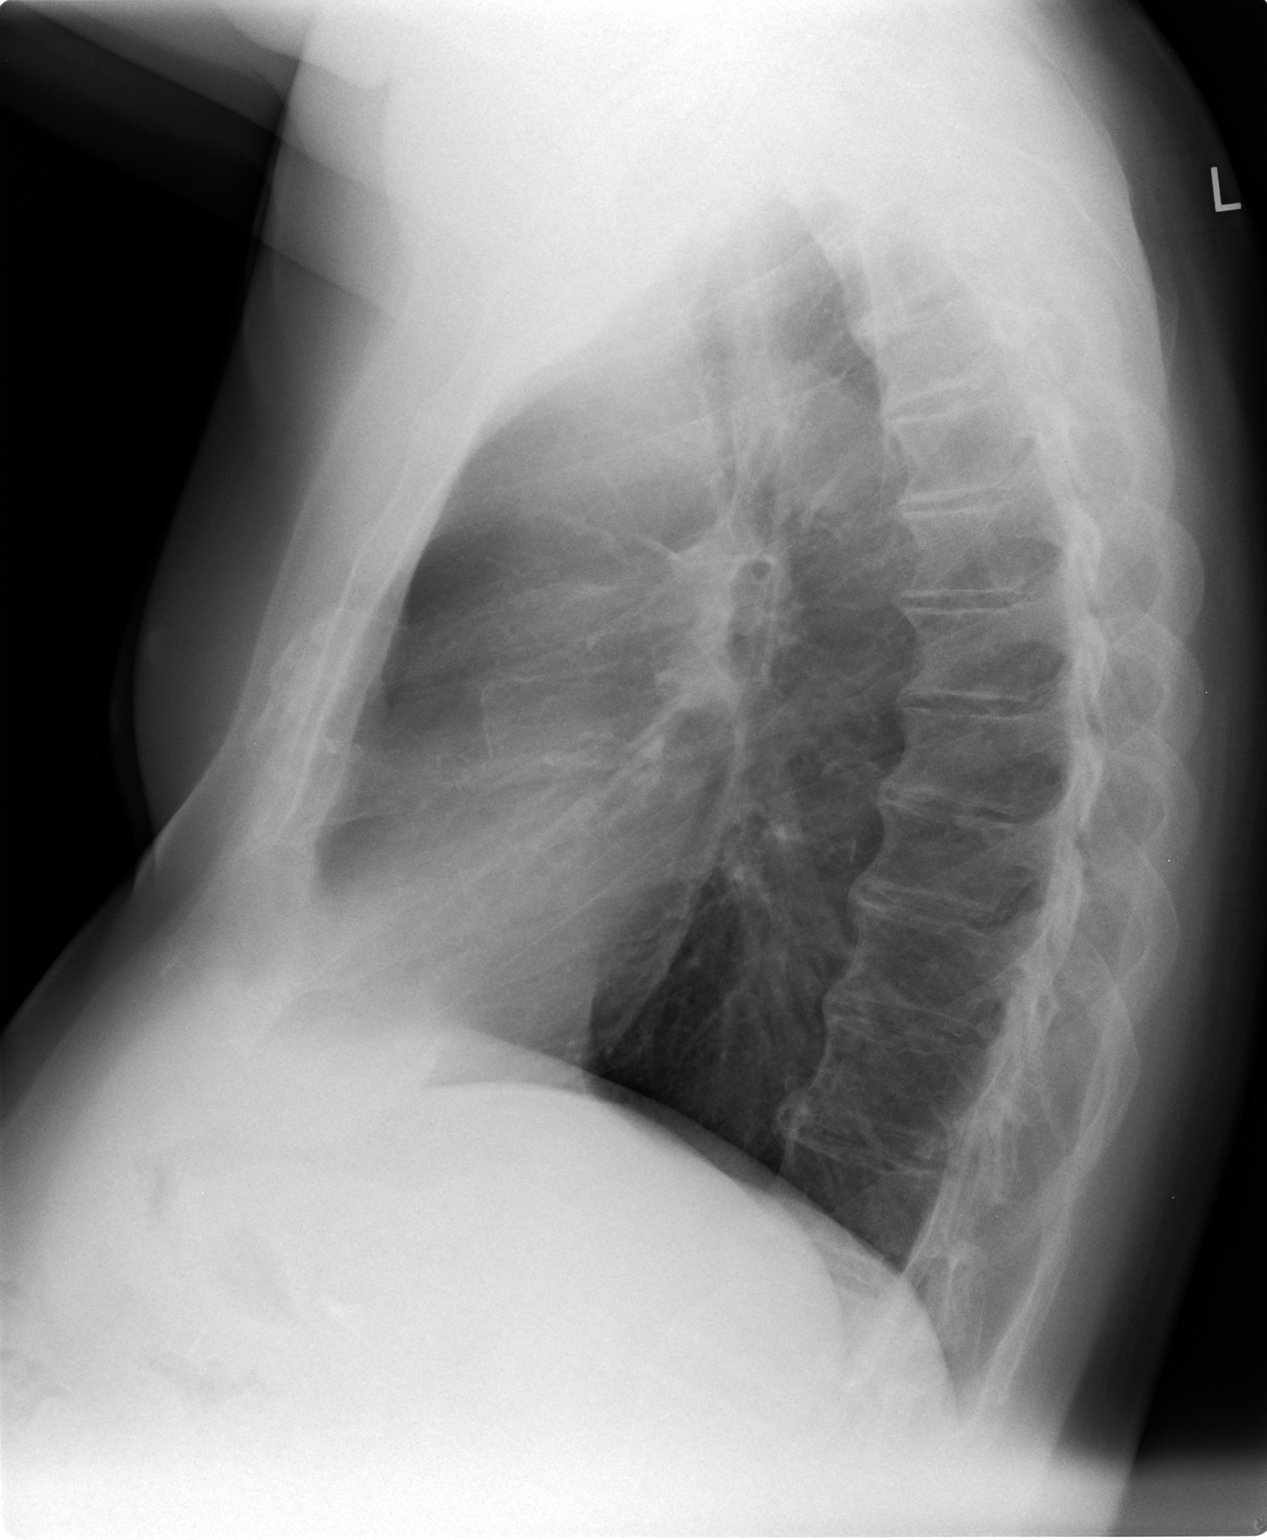

[2 of 2 positions shown; findings below may reference images not displayed]

FINDINGS: The heart size and mediastinal contours are within normal limits.
Both lungs are clear. No pneumothorax or pleural effusion is noted.
The visualized skeletal structures are unremarkable.
IMPRESSION: No acute cardiopulmonary abnormality seen.

## 2015-06-14 ENCOUNTER — Encounter: Payer: Self-pay | Admitting: Family Medicine

## 2015-06-14 ENCOUNTER — Ambulatory Visit (INDEPENDENT_AMBULATORY_CARE_PROVIDER_SITE_OTHER): Payer: Medicare Other | Admitting: Family Medicine

## 2015-06-14 VITALS — BP 154/73 | HR 68 | Temp 97.2°F | Ht 70.0 in | Wt 193.2 lb

## 2015-06-14 DIAGNOSIS — Z1159 Encounter for screening for other viral diseases: Secondary | ICD-10-CM | POA: Diagnosis not present

## 2015-06-14 DIAGNOSIS — E785 Hyperlipidemia, unspecified: Secondary | ICD-10-CM

## 2015-06-14 DIAGNOSIS — M109 Gout, unspecified: Secondary | ICD-10-CM

## 2015-06-14 DIAGNOSIS — R7301 Impaired fasting glucose: Secondary | ICD-10-CM

## 2015-06-14 DIAGNOSIS — I10 Essential (primary) hypertension: Secondary | ICD-10-CM | POA: Diagnosis not present

## 2015-06-14 DIAGNOSIS — I701 Atherosclerosis of renal artery: Secondary | ICD-10-CM | POA: Diagnosis not present

## 2015-06-14 DIAGNOSIS — Z23 Encounter for immunization: Secondary | ICD-10-CM

## 2015-06-14 DIAGNOSIS — R351 Nocturia: Secondary | ICD-10-CM

## 2015-06-14 LAB — POCT GLYCOSYLATED HEMOGLOBIN (HGB A1C): Hemoglobin A1C: 5.9

## 2015-06-14 NOTE — Progress Notes (Signed)
BP 154/73 mmHg  Pulse 68  Temp(Src) 97.2 F (36.2 C) (Oral)  Ht 5' 10"  (1.778 m)  Wt 193 lb 3.2 oz (87.635 kg)  BMI 27.72 kg/m2   Subjective:    Patient ID: James [name], male    DOB: [birth year], 69 y.o.   MRN: [id number]  HPI: James [name] is a 69 y.o. male presenting on 06/14/2015 for Annual Exam   HPI Hypertension Patient is coming in today for recheck on his hypertension. He is currently taking Norvasc 10 mg and metoprolol 50 mg. His blood pressure today is slightly elevated at 154/73. Patient denies headaches, blurred vision, chest pains, shortness of breath, or weakness. Denies any side effects from medication and is content with current medication. He says his blood pressure has been running better at home. He does have some baseline renal dysfunction.  Hyperlipidemia Patient is also coming in for a recheck on his cholesterol and he is currently taking atorvastatin 40 mg. He denies any difficulties with the medication or myalgias. He is due for recheck.  Gout Patient would like to discuss some of his gout medication. He is currently taking allopurinol 100 mg twice daily and indomethacin as needed 50 mg when he gets a flare. He says he is currently having a flare in his left leg and knee that has been lasting for a month and is starting to get better.  Nocturia Patient is content is been having increasing nocturia and would like to get some labs checked such as prostate and diabetes and CVA, because for why he is having nocturia.  Relevant past medical, surgical, family and social history reviewed and updated as indicated. Interim medical history since our last visit reviewed. Allergies and medications reviewed and updated.  Review of Systems  Constitutional: Negative for fever.  HENT: Negative for ear discharge and ear pain.   Eyes: Negative for discharge and visual disturbance.  Respiratory: Negative for shortness of breath and wheezing.   Cardiovascular:  Negative for chest pain and leg swelling.  Gastrointestinal: Negative for abdominal pain, diarrhea and constipation.  Genitourinary: Positive for frequency (especially at night). Negative for difficulty urinating.  Musculoskeletal: Positive for arthralgias. Negative for back pain and gait problem.  Skin: Negative for rash.  Neurological: Negative for syncope, light-headedness and headaches.  All other systems reviewed and are negative.   Per HPI unless specifically indicated above     Medication List       This list is accurate as of: 06/14/15 11:22 AM.  Always use your most recent med list.               allopurinol 100 MG tablet  Commonly known as:  ZYLOPRIM  TAKE 2 TABLETS DAILY     amLODipine 10 MG tablet  Commonly known as:  NORVASC  TAKE 1 TABLET DAILY     aspirin 81 MG tablet  Take 81 mg by mouth 2 (two) times daily.     atorvastatin 40 MG tablet  Commonly known as:  LIPITOR  TAKE 1 TABLET DAILY     indomethacin 50 MG capsule  Commonly known as:  INDOCIN  PRN     metoprolol 50 MG tablet  Commonly known as:  LOPRESSOR  TAKE 1 TABLET DAILY     pantoprazole 40 MG tablet  Commonly known as:  PROTONIX  TAKE 1 TABLET DAILY           Objective:    BP 154/73 mmHg  Pulse 68  Temp(Src)  97.2 F (36.2 C) (Oral)  Ht 5' 10"  (1.778 m)  Wt 193 lb 3.2 oz (87.635 kg)  BMI 27.72 kg/m2  Wt Readings from Last 3 Encounters:  06/14/15 193 lb 3.2 oz (87.635 kg)  04/29/15 196 lb 1.6 oz (88.95 kg)  01/17/15 200 lb (90.719 kg)    Physical Exam  Constitutional: He is oriented to person, place, and time. He appears well-developed and well-nourished. No distress.  Eyes: Conjunctivae and EOM are normal. Pupils are equal, round, and reactive to light. Right eye exhibits no discharge. No scleral icterus.  Cardiovascular: Normal rate, regular rhythm, normal heart sounds and intact distal pulses.   No murmur heard. Pulmonary/Chest: Effort normal and breath sounds normal.  No respiratory distress. He has no wheezes.  Musculoskeletal: Normal range of motion. He exhibits no edema.  Tenderness to palpation in right knee and ankle but no erythema or warmth is noted. No swelling noted as well.  Neurological: He is alert and oriented to person, place, and time. Coordination normal.  Skin: Skin is warm and dry. No rash noted. He is not diaphoretic.  Psychiatric: He has a normal mood and affect. His behavior is normal.  Vitals reviewed.   Results for orders placed or performed in visit on 06/12/14  Lipid panel  Result Value Ref Range   Cholesterol, Total 131 100 - 199 mg/dL   Triglycerides 116 0 - 149 mg/dL   HDL 48 >39 mg/dL   VLDL Cholesterol Cal 23 5 - 40 mg/dL   LDL Calculated 60 0 - 99 mg/dL   Chol/HDL Ratio 2.7 0.0 - 5.0 ratio units  PSA, total and free  Result Value Ref Range   PSA 2.5 0.0 - 4.0 ng/mL   PSA, Free 0.87 N/A ng/mL   PSA, Free Pct 34.8 %  Thyroid Panel With TSH  Result Value Ref Range   TSH 1.090 0.450 - 4.500 uIU/mL   T4, Total 10.3 4.5 - 12.0 ug/dL   T3 Uptake Ratio 29 24 - 39 %   Free Thyroxine Index 3.0 1.2 - 4.9  Uric acid  Result Value Ref Range   Uric Acid 9.3 (H) 3.7 - 8.6 mg/dL  CMP14+EGFR  Result Value Ref Range   Glucose 112 (H) 65 - 99 mg/dL   BUN 14 8 - 27 mg/dL   Creatinine, Ser 1.46 (H) 0.76 - 1.27 mg/dL   GFR calc non Af Amer 49 (L) >59 mL/min/1.73   GFR calc Af Amer 56 (L) >59 mL/min/1.73   BUN/Creatinine Ratio 10 10 - 22   Sodium 140 134 - 144 mmol/L   Potassium 5.0 3.5 - 5.2 mmol/L   Chloride 98 97 - 108 mmol/L   CO2 25 18 - 29 mmol/L   Calcium 9.9 8.6 - 10.2 mg/dL   Total Protein 7.1 6.0 - 8.5 g/dL   Albumin 4.6 3.6 - 4.8 g/dL   Globulin, Total 2.5 1.5 - 4.5 g/dL   Albumin/Globulin Ratio 1.8 1.1 - 2.5   Total Bilirubin 0.8 0.0 - 1.2 mg/dL   Alkaline Phosphatase 142 (H) 39 - 117 IU/L   AST 20 0 - 40 IU/L   ALT 16 0 - 44 IU/L  POCT CBC  Result Value Ref Range   WBC 7.5 4.6 - 10.2 K/uL   Lymph, poc  1.6 0.6 - 3.4   POC LYMPH PERCENT 20.8 10 - 50 %L   POC Granulocyte 5.6 2 - 6.9   Granulocyte percent 74.6 37 - 80 %G   RBC  5.5 4.69 - 6.13 M/uL   Hemoglobin 15.0 14.1 - 18.1 g/dL   HCT, POC 46.3 43.5 - 53.7 %   MCV 83.6 80 - 97 fL   MCH, POC 27.0 27 - 31.2 pg   MCHC 32.3 31.8 - 35.4 g/dL   RDW, POC 14.3 %   Platelet Count, POC 222.0 142 - 424 K/uL   MPV 8.9 0 - 99.8 fL  POCT UA - Microscopic Only  Result Value Ref Range   WBC, Ur, HPF, POC NEG    RBC, urine, microscopic NEG    Bacteria, U Microscopic NEG    Mucus, UA NEG    Epithelial cells, urine per micros OCC    Crystals, Ur, HPF, POC NEG    Casts, Ur, LPF, POC NEG    Yeast, UA NEG   POCT urinalysis dipstick  Result Value Ref Range   Color, UA STRAW    Clarity, UA CLEAR    Glucose, UA NEG    Bilirubin, UA NEG    Ketones, UA NEG    Spec Grav, UA 1.020    Blood, UA NEG    pH, UA 5.0    Protein, UA NEG    Urobilinogen, UA negative    Nitrite, UA NEG    Leukocytes, UA Negative       Assessment & Plan:   Problem List Items Addressed This Visit      Cardiovascular and Mediastinum   HTN (hypertension) - Primary   Relevant Orders   CMP14+EGFR (Completed)   Microalbumin, urine (Completed)     Other   Gout   Relevant Orders   Uric acid (Completed)   Microalbumin, urine (Completed)    Other Visit Diagnoses    Hyperlipidemia LDL goal <130        Relevant Orders    Lipid panel (Completed)    Microalbumin, urine (Completed)    Elevated fasting glucose        Relevant Orders    POCT glycosylated hemoglobin (Hb A1C) (Completed)    Microalbumin, urine (Completed)    Need for hepatitis C screening test        Relevant Orders    Microalbumin, urine (Completed)    Nocturia        Relevant Orders    PSA, total and free (Completed)    Microalbumin, urine (Completed)    Encounter for immunization            Follow up plan: Return in about 4 weeks (around 07/12/2015), or if symptoms worsen or fail to improve,  for htn recheck.  Counseling provided for all of the vaccine components Orders Placed This Encounter  Procedures  . CMP14+EGFR  . Lipid panel  . Uric acid  . POCT glycosylated hemoglobin (Hb A1C)    Caryl Pina, MD [facility] 06/14/2015, 11:22 AM

## 2015-06-15 LAB — CMP14+EGFR
ALT: 12 IU/L (ref 0–44)
AST: 20 IU/L (ref 0–40)
Albumin/Globulin Ratio: 1.8 (ref 1.1–2.5)
Albumin: 4.4 g/dL (ref 3.6–4.8)
Alkaline Phosphatase: 180 IU/L — ABNORMAL HIGH (ref 39–117)
BUN/Creatinine Ratio: 11 (ref 10–22)
BUN: 14 mg/dL (ref 8–27)
Bilirubin Total: 0.9 mg/dL (ref 0.0–1.2)
CO2: 26 mmol/L (ref 18–29)
Calcium: 9.4 mg/dL (ref 8.6–10.2)
Chloride: 100 mmol/L (ref 97–106)
Creatinine, Ser: 1.27 mg/dL (ref 0.76–1.27)
GFR calc Af Amer: 66 mL/min/{1.73_m2} (ref >59)
GFR calc non Af Amer: 57 mL/min/{1.73_m2} — ABNORMAL LOW (ref >59)
Globulin, Total: 2.4 g/dL (ref 1.5–4.5)
Glucose: 105 mg/dL — ABNORMAL HIGH (ref 65–99)
Potassium: 4 mmol/L (ref 3.5–5.2)
Sodium: 141 mmol/L (ref 136–144)
Total Protein: 6.8 g/dL (ref 6.0–8.5)

## 2015-06-15 LAB — LIPID PANEL
Chol/HDL Ratio: 2.6 ratio units (ref 0.0–5.0)
Cholesterol, Total: 134 mg/dL (ref 100–199)
HDL: 51 mg/dL (ref >39)
LDL Calculated: 61 mg/dL (ref 0–99)
Triglycerides: 111 mg/dL (ref 0–149)
VLDL Cholesterol Cal: 22 mg/dL (ref 5–40)

## 2015-06-15 LAB — MICROALBUMIN, URINE: Microalbumin, Urine: 163.9 ug/mL

## 2015-06-15 LAB — PSA, TOTAL AND FREE
PSA, Free Pct: 28.1 %
PSA, Free: 0.87 ng/mL
Prostate Specific Ag, Serum: 3.1 ng/mL (ref 0.0–4.0)

## 2015-06-15 LAB — URIC ACID: Uric Acid: 6.6 mg/dL (ref 3.7–8.6)

## 2015-06-29 ENCOUNTER — Other Ambulatory Visit: Payer: Self-pay | Admitting: Nurse Practitioner

## 2015-07-15 ENCOUNTER — Encounter: Payer: Self-pay | Admitting: Family Medicine

## 2015-07-15 ENCOUNTER — Ambulatory Visit (INDEPENDENT_AMBULATORY_CARE_PROVIDER_SITE_OTHER): Payer: Medicare Other | Admitting: Family Medicine

## 2015-07-15 VITALS — BP 158/87 | HR 71 | Temp 97.2°F | Ht 70.0 in | Wt 192.6 lb

## 2015-07-15 DIAGNOSIS — I10 Essential (primary) hypertension: Secondary | ICD-10-CM

## 2015-07-15 DIAGNOSIS — K2100 Gastro-esophageal reflux disease with esophagitis, without bleeding: Secondary | ICD-10-CM

## 2015-07-15 DIAGNOSIS — I701 Atherosclerosis of renal artery: Secondary | ICD-10-CM

## 2015-07-15 NOTE — Progress Notes (Signed)
BP 158/87 mmHg  Pulse 71  Temp(Src) 97.2 F (36.2 C) (Oral)  Ht _0  (1.778 m)  Wt 192 lb 9.6 oz (87.363 kg)  BMI 27.64 kg/m2   Subjective:    Patient ID: James [name], male    DOB: 15-Apr-1946, 69 y.o.   MRN: [id number]  HPI: James [name] is a 69 y.o. male presenting on 07/15/2015 for Hypertension   HPI GERD Patient has been taking Protonix and needs a refill for Protonix. Feels like Protonix is doing very well for him to control his reflux symptoms. He denies any vomiting he denies any blood in the stool. And he did denies any abdominal pain currently. Discussed dietary changes that he could implement to help prevent GERD from worsening.  Hypertension Patient is currently on metoprolol 50 mg extended release daily and Norvasc 10 mg daily for his hypertension. He is seen in nephrology because of kidney damage and they are concerned about a left-sided renal artery stenosis. He is artery had his right side stented. Discussed his blood pressure being elevated to 158/87 and that it's been up there consistently. He has seen his nephrologist but they did not mention that. I discussed with him the option of some medications that we could use to control his blood pressure better. He would like to go discussed these options with the nephrologist prior to starting anything.  Relevant past medical, surgical, family and social history reviewed and updated as indicated. Interim medical history since our last visit reviewed. Allergies and medications reviewed and updated.  Review of Systems  Constitutional: Negative for fever and chills.  HENT: Negative for congestion, ear discharge and ear pain.   Eyes: Negative for discharge and visual disturbance.  Respiratory: Negative for shortness of breath and wheezing.   Cardiovascular: Negative for chest pain and leg swelling.  Gastrointestinal: Negative for nausea, vomiting, abdominal pain, diarrhea, constipation and abdominal  distention.  Genitourinary: Negative for difficulty urinating.  Musculoskeletal: Negative for back pain and gait problem.  Skin: Negative for rash.  Neurological: Negative for dizziness, syncope, light-headedness and headaches.  All other systems reviewed and are negative.   Per HPI unless specifically indicated above     Medication List       This list is accurate as of: 07/15/15 10:23 AM.  Always use your most recent med list.               allopurinol 100 MG tablet  Commonly known as:  ZYLOPRIM  TAKE 2 TABLETS DAILY     amLODipine 10 MG tablet  Commonly known as:  NORVASC  TAKE 1 TABLET DAILY     aspirin 81 MG tablet  Take 81 mg by mouth 2 (two) times daily.     atorvastatin 40 MG tablet  Commonly known as:  LIPITOR  TAKE 1 TABLET DAILY     indomethacin 50 MG capsule  Commonly known as:  INDOCIN  PRN     metoprolol 50 MG tablet  Commonly known as:  LOPRESSOR  TAKE 1 TABLET DAILY     pantoprazole 40 MG tablet  Commonly known as:  PROTONIX  TAKE 1 TABLET DAILY           Objective:    BP 158/87 mmHg  Pulse 71  Temp(Src) 97.2 F (36.2 C) (Oral)  Ht _1  (1.778 m)  Wt 192 lb 9.6 oz (87.363 kg)  BMI 27.64 kg/m2  Wt Readings from Last 3 Encounters:  07/15/15 192 lb 9.6 oz (  87.363 kg)  06/14/15 193 lb 3.2 oz (87.635 kg)  04/29/15 196 lb 1.6 oz (88.95 kg)    Physical Exam  Constitutional: He is oriented to person, place, and time. He appears well-developed and well-nourished. No distress.  Eyes: Conjunctivae and EOM are normal. Pupils are equal, round, and reactive to light. Right eye exhibits no discharge. No scleral icterus.  Cardiovascular: Normal rate, regular rhythm, normal heart sounds and intact distal pulses.   No murmur heard. Pulmonary/Chest: Effort normal and breath sounds normal. No respiratory distress. He has no wheezes.  Abdominal: Soft. Bowel sounds are normal. He exhibits no distension. There is no tenderness. There is no rebound.    Musculoskeletal: Normal range of motion. He exhibits no edema.  Neurological: He is alert and oriented to person, place, and time. Coordination normal.  Skin: Skin is warm and dry. No rash noted. He is not diaphoretic.  Psychiatric: He has a normal mood and affect. His behavior is normal.  Vitals reviewed.   Results for orders placed or performed in visit on 06/14/15  CMP14+EGFR  Result Value Ref Range   Glucose 105 (H) 65 - 99 mg/dL   BUN 14 8 - 27 mg/dL   Creatinine, Ser 1.27 0.76 - 1.27 mg/dL   GFR calc non Af Amer 57 (L) >59 mL/min/1.73   GFR calc Af Amer 66 >59 mL/min/1.73   BUN/Creatinine Ratio 11 10 - 22   Sodium 141 136 - 144 mmol/L   Potassium 4.0 3.5 - 5.2 mmol/L   Chloride 100 97 - 106 mmol/L   CO2 26 18 - 29 mmol/L   Calcium 9.4 8.6 - 10.2 mg/dL   Total Protein 6.8 6.0 - 8.5 g/dL   Albumin 4.4 3.6 - 4.8 g/dL   Globulin, Total 2.4 1.5 - 4.5 g/dL   Albumin/Globulin Ratio 1.8 1.1 - 2.5   Bilirubin Total 0.9 0.0 - 1.2 mg/dL   Alkaline Phosphatase 180 (H) 39 - 117 IU/L   AST 20 0 - 40 IU/L   ALT 12 0 - 44 IU/L  Lipid panel  Result Value Ref Range   Cholesterol, Total 134 100 - 199 mg/dL   Triglycerides 111 0 - 149 mg/dL   HDL 51 >39 mg/dL   VLDL Cholesterol Cal 22 5 - 40 mg/dL   LDL Calculated 61 0 - 99 mg/dL   Chol/HDL Ratio 2.6 0.0 - 5.0 ratio units  Uric acid  Result Value Ref Range   Uric Acid 6.6 3.7 - 8.6 mg/dL  PSA, total and free  Result Value Ref Range   Prostate Specific Ag, Serum 3.1 0.0 - 4.0 ng/mL   PSA, Free 0.87 N/A ng/mL   PSA, Free Pct 28.1 %  Microalbumin, urine  Result Value Ref Range   Microalbum.,U,Random 163.9 Not Estab. ug/mL  POCT glycosylated hemoglobin (Hb A1C)  Result Value Ref Range   Hemoglobin A1C 5.9       Assessment & Plan:   Problem List Items Addressed This Visit      Cardiovascular and Mediastinum   Essential hypertension, benign    Hypertension, elevated wrote down recommendations of possibilities but patient will  go to discuss them with his nephrologist first        Digestive   Gastroesophageal reflux disease with esophagitis - Primary    Continue Protonix, controlled at this point.          Follow up plan: Return if symptoms worsen or fail to improve.  Counseling provided for all of the  vaccine components No orders of the defined types were placed in this encounter.    Caryl Pina, MD [facility] 07/15/2015, 10:23 AM

## 2015-07-17 DIAGNOSIS — I159 Secondary hypertension, unspecified: Secondary | ICD-10-CM | POA: Insufficient documentation

## 2015-07-17 DIAGNOSIS — K2100 Gastro-esophageal reflux disease with esophagitis, without bleeding: Secondary | ICD-10-CM | POA: Insufficient documentation

## 2015-07-17 NOTE — Assessment & Plan Note (Signed)
Continue Protonix, controlled at this point.

## 2015-07-17 NOTE — Assessment & Plan Note (Signed)
Hypertension, elevated wrote down recommendations of possibilities but patient will go to discuss them with his nephrologist first

## 2015-07-29 ENCOUNTER — Other Ambulatory Visit: Payer: Self-pay | Admitting: Family Medicine

## 2015-07-30 ENCOUNTER — Other Ambulatory Visit: Payer: Self-pay | Admitting: Family Medicine

## 2015-08-01 ENCOUNTER — Encounter: Payer: Self-pay | Admitting: Family Medicine

## 2015-08-01 ENCOUNTER — Ambulatory Visit (INDEPENDENT_AMBULATORY_CARE_PROVIDER_SITE_OTHER): Payer: Medicare Other | Admitting: Family Medicine

## 2015-08-01 VITALS — BP 174/83 | HR 80 | Temp 97.9°F | Ht 70.0 in | Wt 194.2 lb

## 2015-08-01 DIAGNOSIS — I10 Essential (primary) hypertension: Secondary | ICD-10-CM | POA: Diagnosis not present

## 2015-08-01 DIAGNOSIS — M545 Low back pain, unspecified: Secondary | ICD-10-CM

## 2015-08-01 DIAGNOSIS — M549 Dorsalgia, unspecified: Secondary | ICD-10-CM | POA: Insufficient documentation

## 2015-08-01 MED ORDER — TRAMADOL HCL 50 MG PO TABS: 50.0000 mg | ORAL_TABLET | Freq: Three times a day (TID) | ORAL | Status: DC | PRN

## 2015-08-01 MED ORDER — CYCLOBENZAPRINE HCL 5 MG PO TABS: 5.0000 mg | ORAL_TABLET | Freq: Three times a day (TID) | ORAL | Status: DC | PRN

## 2015-08-01 NOTE — Patient Instructions (Signed)
Great to see you!  Come back in 2 weeks for a nurse visit to check your blood pressure  You will be called to arrange physical therapy  Both medications I have prescribed can make you sleepy, so be very cautious after you take them  Come back in 6 weeks to see how you are doing

## 2015-08-01 NOTE — Progress Notes (Signed)
   HPI  Patient presents today  Discussed left-sided low back pain.  He describes a L sided sharp stabbing type pain worse with exercise or movement., No radiation NO leg weakness, saddle anestsesia or loss of bowel or bladder control No fevers or malaise Has been taking indocin with some improvement Has had previous episode which was very responsive to flexeril  HTN Good med compliance No HA, Chest pain, dyspnea, or palpitations States it is higher at the doctor's office usually.   PMH: Smoking status noted ROS: Per HPI  Objective: BP 174/83 mmHg  Pulse 80  Temp(Src) 97.9 F (36.6 C) (Oral)  Ht 5\' 10"  (1.778 m)  Wt 194 lb 3.2 oz (88.089 kg)  BMI 27.86 kg/m2 Gen: NAD, alert, cooperative with exam HEENT: NCAT CV: RRR, good S1/S2, no murmur Resp: CTABL, no wheezes, non-labored Ext: No edema, warm Neuro: Alert and oriented, Normal gait  MSK: No paraspinal muscle or bony tenderness of BL lumbar back  Assessment and plan:  # Low back pain MSK back pain without sciatica 3 weeks duration Flexeril, tramadol as I am concerned indocin is contributing to elevated BP PT Follow up 6 weeks  # HTN Elevated today Nurse BP check in 2 weeks No new meds given pain and high NSAID load right now Suspect some portion of white coat HTN  Orders Placed This Encounter  Procedures  . Ambulatory referral to Physical Therapy    Referral Priority:  Routine    Referral Type:  Physical Medicine    Referral Reason:  Specialty Services Required    Requested Specialty:  Physical Therapy    Number of Visits Requested:  1    Meds ordered this encounter  Medications  . cyclobenzaprine (FLEXERIL) 5 MG tablet    Sig: Take 1 tablet (5 mg total) by mouth 3 (three) times daily as needed for muscle spasms.    Dispense:  30 tablet    Refill:  1  . traMADol (ULTRAM) 50 MG tablet    Sig: Take 1 tablet (50 mg total) by mouth every 8 (eight) hours as needed.    Dispense:  30 tablet    Refill:   0    Laroy Apple, MD [facility] 08/01/2015, 3:16 PM

## 2015-08-15 ENCOUNTER — Encounter: Payer: Self-pay | Admitting: Physical Therapy

## 2015-08-15 ENCOUNTER — Ambulatory Visit: Payer: Medicare Other | Attending: Family Medicine | Admitting: Physical Therapy

## 2015-08-15 DIAGNOSIS — M545 Low back pain, unspecified: Secondary | ICD-10-CM

## 2015-08-15 DIAGNOSIS — M2560 Stiffness of unspecified joint, not elsewhere classified: Secondary | ICD-10-CM | POA: Diagnosis not present

## 2015-08-15 DIAGNOSIS — R6889 Other general symptoms and signs: Secondary | ICD-10-CM | POA: Diagnosis not present

## 2015-08-15 NOTE — Therapy (Signed)
[facility] [place], [place], [address] Phone: [phone]   Fax:  [phone]  Physical Therapy Evaluation  Patient Details  Name: James [name] MRN: [id number] Date of Birth: [birth year] Referring Provider: Kenn File, MD  Encounter Date: 08/15/2015      PT End of Session - 08/15/15 0905    Visit Number 1   Number of Visits 12   Date for PT Re-Evaluation 09/26/15   PT Start Time 0915   PT Stop Time 0955   PT Time Calculation (min) 40 min   Activity Tolerance Patient tolerated treatment well   Behavior During Therapy [facility] for tasks assessed/performed      Past Medical History  Diagnosis Date  . Arthritis   . Hyperlipidemia     takes Lipitor daily  . H/O: GI bleed   . Carotid artery occlusion   . H/O renal artery stenosis   . DVT (deep venous thrombosis) ([place]) 2008  . Hypertension     takes Metoprolol and Amlodipine daily  . Joint swelling   . Joint pain   . Gout     takes Allopurinol daily and Indomethacin prn  . GERD (gastroesophageal reflux disease)     takes Protonix daily  . History of gastric ulcer   . History of colon polyps   . History of blood transfusion   . Gout flare   . DVT (deep venous thrombosis) [facility])     Past Surgical History  Procedure Laterality Date  . Renal artery stent  2009    Right renal artery stenting-  Right Kidney  . Carotid endarterectomy  2008    right CEA  . Small intestine removed  2001    d/t random gi bleeding  . Esophagogastroduodenoscopy    . Cholecystectomy  mid 90's  . Colonoscopy    . Hernia repair      umbilical hernia  . Endarterectomy  06/02/2012    Procedure: ENDARTERECTOMY CAROTID;  Surgeon: Serafina Mitchell, MD;  Location: [place];  Service: Vascular;  Laterality: Left;  . Patch angioplasty  06/02/2012    Procedure: PATCH ANGIOPLASTY;  Surgeon: Serafina Mitchell, MD;  Location: [place];  Service: Vascular;  Laterality: Left;  using Vascu-Guard Patch   . Colon surgery      There were no vitals filed for this visit.  Visit Diagnosis:  Left-sided low back pain without sciatica - Plan: PT plan of care cert/re-cert  Stiffness of joints, multiple sites - Plan: PT plan of care cert/re-cert  Activity intolerance - Plan: PT plan of care cert/re-cert      Subjective Assessment - 08/15/15 0912    Subjective Patient reports onset of LBP about 2 months ago after picking up some heavy items. He had another incident with pain in the same place about 1.5 years ago again with pickig up something on an angle. He really notices it when he's driving his tractor and looking back.   Pertinent History HTN   Patient Stated Goals decrease pain, increase strength and movement   Currently in Pain? Yes   Pain Score 3    Pain Location Back   Pain Orientation Left   Pain Descriptors / Indicators Stabbing   Pain Type Acute pain   Pain Onset More than a month ago   Pain Frequency Intermittent   Aggravating Factors  bending, twisting   Pain Relieving Factors changing positions   Effect of Pain on Daily Activities doesn't want to move  [facility] PT Assessment - 08/15/15 0001    Assessment   Medical Diagnosis L sided LBP without sciatica   Referring Provider Kenn File, MD   Onset Date/Surgical Date 06/15/15   Next MD Visit March   Prior Therapy n   Precautions   Precautions None   Restrictions   Weight Bearing Restrictions No   Balance Screen   Has the patient fallen in the past 6 months No   Has the patient had a decrease in activity level because of a fear of falling?  No   Is the patient reluctant to leave their home because of a fear of falling?  No   Prior Function   Level of Independence Independent   Vocation Retired   ROM / Strength   AROM / PROM / Strength AROM;Strength   AROM   AROM Assessment Site Lumbar   Lumbar Flexion full   Lumbar Extension 12  feels it   Lumbar - Right Side Bend 13   Lumbar - Left Side Bend  28   Lumbar - Right Rotation 75%   Lumbar - Left Rotation 75%   Strength   Strength Assessment Site Hip;Knee   Right/Left Hip Right;Left   Right Hip Flexion 4+/5   Right Hip Extension 5/5   Right Hip ABduction 5/5   Left Hip Flexion 3-/5   Left Hip Extension 5/5   Left Hip ABduction 5/5   Right/Left Knee Right;Left   Right Knee Flexion 5/5   Right Knee Extension 5/5   Left Knee Flexion 4+/5   Left Knee Extension 5/5   Flexibility   Soft Tissue Assessment /Muscle Length --  tight B piriformis, hip ER and L HS   Palpation   Spinal mobility decreased PA mobility L4/5/S1   Palpation comment Left QL                   [facility] Adult PT Treatment/Exercise - 08/15/15 0001    Modalities   Modalities Electrical Stimulation;Moist Heat   Moist Heat Therapy   Number Minutes Moist Heat 15 Minutes   Moist Heat Location Lumbar Spine   Electrical Stimulation   Electrical Stimulation Location L QL and R Lumbar; Premod 80-150 hz to tolerance x 15 min   Electrical Stimulation Goals Pain                     PT Long Term Goals - 08/15/15 0905    PT LONG TERM GOAL #1   Title I with HEP   Time 6   Period Weeks   Status New   PT LONG TERM GOAL #2   Title Able to perform ADLS with decreased pain in LB to 1/10 or less.   Time 6   Period Weeks   Status New   PT LONG TERM GOAL #3   Title Able to move around in bed with 1/10 LBP or less   Time 6   Period Weeks   Status New               Plan - 08/15/15 0906    Clinical Impression Statement Patient is 70 year old male with c/o L LBP beginning 2 months ago limiting movement, bed mobility and ADLS. He has active TPs in L QL and decreased spinal mobility.   Pt will benefit from skilled therapeutic intervention in order to improve on the following deficits Decreased range of motion;Increased muscle spasms;Decreased activity tolerance;Pain;Impaired flexibility;Decreased mobility;Decreased strength   Rehab Potential  Excellent   PT Frequency 2x / week   PT Duration 6 weeks   PT Treatment/Interventions ADLs/Self Care Home Management;Electrical Stimulation;Moist Heat;Therapeutic exercise;Manual techniques;Passive range of motion;Patient/family education;Neuromuscular re-education;Ultrasound;Dry needling   PT Next Visit Plan [place] to R QL, STW, spinal mobs, B hip flexibility, core strengthening. Estim for pain prn.   Consulted and Agree with Plan of Care Patient          G-Codes - 21-Aug-2015 1250    Functional Assessment Tool Used FOTO = 51% LIMITED   Functional Limitation Mobility: Walking and moving around   Mobility: Walking and Moving Around Current Status [phone]) At least 40 percent but less than 60 percent impaired, limited or restricted   Mobility: Walking and Moving Around Goal Status [phone]) At least 20 percent but less than 40 percent impaired, limited or restricted       Problem List Patient Active Problem List   Diagnosis Date Noted  . Back pain 08/01/2015  . Gastroesophageal reflux disease with esophagitis 07/17/2015  . Essential hypertension, benign 07/17/2015  . S/P arterial stent 04/29/2015  . Renal artery stenosis ([place]) 12/26/2012  . Other and unspecified hyperlipidemia 10/11/2012  . Gout 10/11/2012  . Occlusion and stenosis of carotid artery without mention of cerebral infarction 10/12/2011   Madelyn Flavors PT  August 21, 2015, 12:53 PM  [facility] [address] [place], [place], [address] Phone: [phone]   Fax:  [phone]  Name: James [name] MRN: [id number] Date of Birth: 12-28-1945

## 2015-08-21 ENCOUNTER — Encounter: Payer: Self-pay | Admitting: Physical Therapy

## 2015-08-21 ENCOUNTER — Ambulatory Visit: Payer: Medicare Other | Admitting: Physical Therapy

## 2015-08-21 DIAGNOSIS — M2560 Stiffness of unspecified joint, not elsewhere classified: Secondary | ICD-10-CM | POA: Diagnosis not present

## 2015-08-21 DIAGNOSIS — R6889 Other general symptoms and signs: Secondary | ICD-10-CM | POA: Diagnosis not present

## 2015-08-21 DIAGNOSIS — M545 Low back pain, unspecified: Secondary | ICD-10-CM

## 2015-08-21 NOTE — Patient Instructions (Signed)
Stretching: Hamstring (Sitting)    With right leg straight, tuck other foot near groin. Reach down until stretch is felt in back of thigh. Keep back straight. Hold _30___ seconds. Repeat __3__ times per set. Do __1__ sets per session. Do __2-3__ sessions per day.  http://orth.exer.us/660   Copyright  VHI. All rights reserved.  Stretching: Piriformis   Bend your right knee up and cross your left ankle over your right knee while laying on your back. Hold __30__ seconds. Repeat _3___ times per set. Do _1___ sets per session. Do __2-3__ sessions per day.  http://orth.exer.us/650   Copyright  VHI. All rights reserved.  Knee-to-Chest Stretch: Unilateral    With hand behind right knee, pull knee in to chest until a comfortable stretch is felt in lower back and buttocks. Keep back relaxed. Hold __30__ seconds. Repeat _3__ times per set. Do __1__ sets per session. Do _2-3___ sessions per day.  http://orth.exer.us/126   Copyright  VHI. All rights reserved.

## 2015-08-21 NOTE — Therapy (Signed)
[facility] [place], [place], [address] Phone: [phone]   Fax:  [phone]  Physical Therapy Treatment  Patient Details  Name: James [name] MRN: [id number] Date of Birth: [birth year] Referring Provider: Kenn File, MD  Encounter Date: 08/21/2015      PT End of Session - 08/21/15 0905    Visit Number 2   Number of Visits 12   Date for PT Re-Evaluation 09/26/15   PT Start Time 0903   PT Stop Time 0947   PT Time Calculation (min) 44 min   Activity Tolerance Patient tolerated treatment well   Behavior During Therapy [facility] for tasks assessed/performed      Past Medical History  Diagnosis Date  . Arthritis   . Hyperlipidemia     takes Lipitor daily  . H/O: GI bleed   . Carotid artery occlusion   . H/O renal artery stenosis   . DVT (deep venous thrombosis) ([employer]) 2008  . Hypertension     takes Metoprolol and Amlodipine daily  . Joint swelling   . Joint pain   . Gout     takes Allopurinol daily and Indomethacin prn  . GERD (gastroesophageal reflux disease)     takes Protonix daily  . History of gastric ulcer   . History of colon polyps   . History of blood transfusion   . Gout flare   . DVT (deep venous thrombosis) [facility])     Past Surgical History  Procedure Laterality Date  . Renal artery stent  2009    Right renal artery stenting-  Right Kidney  . Carotid endarterectomy  2008    right CEA  . Small intestine removed  2001    d/t random gi bleeding  . Esophagogastroduodenoscopy    . Cholecystectomy  mid 90's  . Colonoscopy    . Hernia repair      umbilical hernia  . Endarterectomy  06/02/2012    Procedure: ENDARTERECTOMY CAROTID;  Surgeon: Serafina Mitchell, MD;  Location: [place];  Service: Vascular;  Laterality: Left;  . Patch angioplasty  06/02/2012    Procedure: PATCH ANGIOPLASTY;  Surgeon: Serafina Mitchell, MD;  Location: [place];  Service: Vascular;  Laterality: Left;  using Vascu-Guard Patch   . Colon surgery      There were no vitals filed for this visit.  Visit Diagnosis:  Left-sided low back pain without sciatica  Stiffness of joints, multiple sites  Activity intolerance      Subjective Assessment - 08/21/15 0902    Subjective Reports that his low back is much better than it was last week. Reports pain with certain movements.   Pertinent History HTN   Patient Stated Goals decrease pain, increase strength and movement   Currently in Pain? No/denies            [facility] PT Assessment - 08/21/15 0001    Assessment   Medical Diagnosis L sided LBP without sciatica   Onset Date/Surgical Date 06/15/15   Next MD Visit March   Precautions   Precautions None                     [facility] Adult PT Treatment/Exercise - 08/21/15 0001    Exercises   Exercises Lumbar   Lumbar Exercises: Stretches   Active Hamstring Stretch 3 reps;30 seconds  LLE   Single Knee to Chest Stretch 3 reps;30 seconds;Other (comment)  LLE   Piriformis Stretch 3 reps;30 seconds;Other (comment)  LLE  Modalities   Modalities Electrical Stimulation;Moist Heat;Ultrasound   Moist Heat Therapy   Number Minutes Moist Heat 15 Minutes   Moist Heat Location Lumbar Spine   Electrical Stimulation   Electrical Stimulation Location L low back   Electrical Stimulation Action Pre-Mod   Electrical Stimulation Parameters 80-150 Hz x15 min   Electrical Stimulation Goals Pain   Ultrasound   Ultrasound Location L low back   Ultrasound Parameters 1.5 w/cm2, 100%,1 mhz x10 min   Ultrasound Goals Pain                PT Education - 08/21/15 0916    Education provided Yes   Education Details HEP- SKTC, HS, and Piriforimis stretch   Person(s) Educated Patient   Methods Explanation;Demonstration;Verbal cues;Handout   Comprehension Verbalized understanding;Returned demonstration;Verbal cues required             PT Long Term Goals - 08/21/15 0954    PT LONG TERM GOAL #1   Title I  with HEP   Time 6   Period Weeks   Status On-going   PT LONG TERM GOAL #2   Title Able to perform ADLS with decreased pain in LB to 1/10 or less.   Time 6   Period Weeks   Status On-going   PT LONG TERM GOAL #3   Title Able to move around in bed with 1/10 LBP or less   Time 6   Period Weeks   Status On-going               Plan - 08/21/15 0935    Clinical Impression Statement Patient tolerated today's treatment well with only reports of pain with bed mobility from supine to sidelying and with initial movement into L SKTC. Patient's ability to complete log rolling for bed mobility was assessed and he is competant of the technique but further enforcement may be required for patient to understand the importance of logrolling to reduce pain. Tolerated low back and hip stretches well with moderate multimodal cueing for correct stretch technique. Patient had no pain at rest today per patient report only with lumbar rotation motions which he was educated to avoid. Patient was able to pinpoint the area of pain which he indicated as L low back just superior to L iliac crest. Patient accepted low back stretches HEP without questions and verbalized understanding. Normal modalities response noted following removal of the modalities. Patient denied pain with sitting following today's treatment.   Pt will benefit from skilled therapeutic intervention in order to improve on the following deficits Decreased range of motion;Increased muscle spasms;Decreased activity tolerance;Pain;Impaired flexibility;Decreased mobility;Decreased strength   Rehab Potential Excellent   PT Frequency 2x / week   PT Duration 6 weeks   PT Treatment/Interventions ADLs/Self Care Home Management;Electrical Stimulation;Moist Heat;Therapeutic exercise;Manual techniques;Passive range of motion;Patient/family education;Neuromuscular re-education;Ultrasound;Dry needling   PT Next Visit Plan [place] to R QL, STW, spinal mobs, B hip  flexibility, core strengthening. Estim for pain prn.   PT Home Exercise Plan HEP- HS, SKTC, Piriformis stretch   Consulted and Agree with Plan of Care Patient        Problem List Patient Active Problem List   Diagnosis Date Noted  . Back pain 08/01/2015  . Gastroesophageal reflux disease with esophagitis 07/17/2015  . Essential hypertension, benign 07/17/2015  . S/P arterial stent 04/29/2015  . Renal artery stenosis ([place]) 12/26/2012  . Other and unspecified hyperlipidemia 10/11/2012  . Gout 10/11/2012  . Occlusion and stenosis of carotid artery without  mention of cerebral infarction 10/12/2011    Wynelle Fanny, PTA 08/21/2015, 9:56 AM  [facility] [address] [place], [place], [address] Phone: [phone]   Fax:  [phone]  Name: KAVIK YERTON MRN: [id number] Date of Birth: Jul 06, 1946

## 2015-08-23 ENCOUNTER — Ambulatory Visit: Payer: Medicare Other | Admitting: Physical Therapy

## 2015-08-23 DIAGNOSIS — M2560 Stiffness of unspecified joint, not elsewhere classified: Secondary | ICD-10-CM | POA: Diagnosis not present

## 2015-08-23 DIAGNOSIS — R6889 Other general symptoms and signs: Secondary | ICD-10-CM

## 2015-08-23 DIAGNOSIS — M545 Low back pain, unspecified: Secondary | ICD-10-CM

## 2015-08-23 NOTE — Therapy (Signed)
[facility] [place], [place], [address] Phone: [phone]   Fax:  [phone]  Physical Therapy Treatment  Patient Details  Name: James [name] MRN: [id number] Date of Birth: [birth year] Referring Provider: Kenn File, MD  Encounter Date: 08/23/2015      PT End of Session - 08/23/15 0853    Visit Number 3   Number of Visits 12   Date for PT Re-Evaluation 09/26/15   PT Start Time 0900   PT Stop Time 1000   PT Time Calculation (min) 60 min   Activity Tolerance Patient tolerated treatment well   Behavior During Therapy [facility] for tasks assessed/performed      Past Medical History  Diagnosis Date  . Arthritis   . Hyperlipidemia     takes Lipitor daily  . H/O: GI bleed   . Carotid artery occlusion   . H/O renal artery stenosis   . DVT (deep venous thrombosis) ([place]) 2008  . Hypertension     takes Metoprolol and Amlodipine daily  . Joint swelling   . Joint pain   . Gout     takes Allopurinol daily and Indomethacin prn  . GERD (gastroesophageal reflux disease)     takes Protonix daily  . History of gastric ulcer   . History of colon polyps   . History of blood transfusion   . Gout flare   . DVT (deep venous thrombosis) [facility])     Past Surgical History  Procedure Laterality Date  . Renal artery stent  2009    Right renal artery stenting-  Right Kidney  . Carotid endarterectomy  2008    right CEA  . Small intestine removed  2001    d/t random gi bleeding  . Esophagogastroduodenoscopy    . Cholecystectomy  mid 90's  . Colonoscopy    . Hernia repair      umbilical hernia  . Endarterectomy  06/02/2012    Procedure: ENDARTERECTOMY CAROTID;  Surgeon: Serafina Mitchell, MD;  Location: [place];  Service: Vascular;  Laterality: Left;  . Patch angioplasty  06/02/2012    Procedure: PATCH ANGIOPLASTY;  Surgeon: Serafina Mitchell, MD;  Location: [place];  Service: Vascular;  Laterality: Left;  using Vascu-Guard Patch   . Colon surgery      There were no vitals filed for this visit.  Visit Diagnosis:  Left-sided low back pain without sciatica  Stiffness of joints, multiple sites  Activity intolerance      Subjective Assessment - 08/23/15 0904    Subjective Patient states that when he gets the pain it can be about a 10/10. He reports getting pain with certain movements like from supne to lumbar flexion, stand to supine, L hip flexion while sitting.   Patient Stated Goals decrease pain, increase strength and movement   Currently in Pain? Yes   Pain Score 2    Pain Location Back   Pain Orientation Left   Pain Descriptors / Indicators --  pulling   Pain Type Acute pain   Pain Onset More than a month ago   Pain Frequency Intermittent   Aggravating Factors  see subjective   Pain Relieving Factors changing positions   Effect of Pain on Daily Activities doesn't want to move                         [facility] Adult PT Treatment/Exercise - 08/23/15 0001    Exercises   Exercises Lumbar;Knee/Hip  Lumbar Exercises: Stretches   Prone on Elbows Stretch --  attempted but painful   Quadruped Mid Back Stretch --  attempted but difficul   Quadruped Mid Back Stretch Limitations also tried seated forward flexion but ended with standing "7" position grabbing plinth and doing left and right as well.   Lumbar Exercises: Supine   Ab Set 10 reps;5 seconds   Clam 10 reps   Heel Slides 10 reps   Bent Knee Raise 10 reps   Bridge 5 reps  pain reported   Lumbar Exercises: Prone   Other Prone Lumbar Exercises POE painful   Knee/Hip Exercises: Aerobic   Nustep L6 x 6 min   Modalities   Modalities Electrical Stimulation;Moist Heat;Ultrasound   Moist Heat Therapy   Number Minutes Moist Heat 15 Minutes   Moist Heat Location Lumbar Spine   Electrical Stimulation   Electrical Stimulation Location L low back   Electrical Stimulation Action premod   Electrical Stimulation Parameters 80-150 Hz to  tolerance x 15 min   Electrical Stimulation Goals Pain   Manual Therapy   Manual Therapy Joint mobilization   Joint Mobilization PA mobs to L2/3 to L5/S1 B                PT Education - 08/23/15 1235    Education provided Yes   Education Details HEP   Person(s) Educated Patient   Methods Explanation;Demonstration;Handout   Comprehension Returned demonstration;Verbalized understanding             PT Long Term Goals - 08/21/15 0954    PT LONG TERM GOAL #1   Title I with HEP   Time 6   Period Weeks   Status On-going   PT LONG TERM GOAL #2   Title Able to perform ADLS with decreased pain in LB to 1/10 or less.   Time 6   Period Weeks   Status On-going   PT LONG TERM GOAL #3   Title Able to move around in bed with 1/10 LBP or less   Time 6   Period Weeks   Status On-going               Plan - 08/23/15 1235    Clinical Impression Statement Patient did well with TE today. He was able to engage TrAb with minimal cueing. He has marked tightness in B quads/HF. Stiffness continues with PA mobs, but mobility improved as mobs progressed.  He has marked tightness of B paraspinals throughout spine.    Pt will benefit from skilled therapeutic intervention in order to improve on the following deficits Decreased range of motion;Increased muscle spasms;Decreased activity tolerance;Pain;Impaired flexibility;Decreased mobility;Decreased strength   Rehab Potential Excellent   PT Frequency 2x / week   PT Duration 6 weeks   PT Next Visit Plan STW to B paraspinals, spinal mobs, Add prone quad stretch, B hip flexibility, core strengthening. Estim/US for pain prn.   PT Home Exercise Plan Tr AB progression and "7" stretch for paraspinals.        Problem List Patient Active Problem List   Diagnosis Date Noted  . Back pain 08/01/2015  . Gastroesophageal reflux disease with esophagitis 07/17/2015  . Essential hypertension, benign 07/17/2015  . S/P arterial stent 04/29/2015   . Renal artery stenosis ([place]) 12/26/2012  . Other and unspecified hyperlipidemia 10/11/2012  . Gout 10/11/2012  . Occlusion and stenosis of carotid artery without mention of cerebral infarction 10/12/2011    Madelyn Flavors PT  08/23/2015, 12:41 PM  [facility] [place], [place], [address] Phone: [phone]   Fax:  [phone]  Name: KIEON COLETTI MRN: [id number] Date of Birth: August 24, 1945

## 2015-08-23 NOTE — Patient Instructions (Signed)
Lower abdominal/core stability exercises  1. Practice your breathing technique: Inhale through your nose expanding your belly and rib cage. Try not to breathe into your chest. Exhale slowly and gradually out your mouth feeling a sense of softness to your body. Practice multiple times. This can be performed unlimited.  2. Finding the lower abdominals. Laying on your back with the knees bent, place your fingers just below your belly button. Using your breathing technique from above, on your exhale gently pull the belly button away from your fingertips without tensing any other muscles. Practice this 5x. Next, as you exhale, draw belly button inwards and hold onto it...then feel as if you are pulling that muscle across your pelvis like you are tightening a belt. This can be hard to do at first so be patient and practice. Do 5-10 reps 1-3 x day. Always recognize quality over quantity; if your abdominal muscles become tired you will notice you may tighten/contract other muscles. This is the time to take a break.   Practice this first laying on your back, then in sitting, progressing to standing and finally adding it to all your daily movements.   3. Adding leg movements. Add the following leg movements to challenge your ability to keep your core stable:  1. Single leg drop outs: Laying on your back with knees bent feet flat. Inhale,  dropping one knee outward KEEPING YOUR PELVIS STILL. Exhale as you bring the leg back, simultaneously performing your lower abdominal contraction. Do 5-10 on each leg.   2. Marching: While keeping your pelvis still, lift the right foot a few inches, put it down then lift left foot. This will mimic a march. Start slow to establish control. Once you have control you may speed it up. Do 10-20x. You MUST keep your lower abdominlas contracted while you march. Breathe naturally    3. Single leg slides: Inhale while you slowly slide one leg out keeping your pelvis still. Only slide  your leg as far as you can keep your pelvis still. Exhale as you bring the leg back to the start, contracting the lower abdominals as you do that. Keep your upper body relaxed. Do 5-10 on each side.    James [name], PT 08/23/2015 9:46 AM [facility] [address] [place], [place], [address] Phone: [phone]   Fax:  [phone]

## 2015-08-27 ENCOUNTER — Encounter: Payer: Self-pay | Admitting: Physical Therapy

## 2015-08-27 ENCOUNTER — Ambulatory Visit: Payer: Medicare Other | Admitting: Physical Therapy

## 2015-08-27 DIAGNOSIS — M2560 Stiffness of unspecified joint, not elsewhere classified: Secondary | ICD-10-CM | POA: Diagnosis not present

## 2015-08-27 DIAGNOSIS — R6889 Other general symptoms and signs: Secondary | ICD-10-CM | POA: Diagnosis not present

## 2015-08-27 DIAGNOSIS — M545 Low back pain, unspecified: Secondary | ICD-10-CM

## 2015-08-27 NOTE — Therapy (Signed)
[facility] [place], [place], [address] Phone: [phone]   Fax:  [phone]  Physical Therapy Treatment  Patient Details  Name: James [name] MRN: [id number] Date of Birth: 20-Jun-1946 Referring Provider: Kenn File, MD  Encounter Date: 08/27/2015      PT End of Session - 08/27/15 1030    Visit Number 4   Number of Visits 12   Date for PT Re-Evaluation 09/26/15   PT Start Time 0954   PT Stop Time 1039   PT Time Calculation (min) 45 min   Activity Tolerance Patient tolerated treatment well;Patient limited by pain   Behavior During Therapy [facility] for tasks assessed/performed      Past Medical History  Diagnosis Date  . Arthritis   . Hyperlipidemia     takes Lipitor daily  . H/O: GI bleed   . Carotid artery occlusion   . H/O renal artery stenosis   . DVT (deep venous thrombosis) ([place]) 2008  . Hypertension     takes Metoprolol and Amlodipine daily  . Joint swelling   . Joint pain   . Gout     takes Allopurinol daily and Indomethacin prn  . GERD (gastroesophageal reflux disease)     takes Protonix daily  . History of gastric ulcer   . History of colon polyps   . History of blood transfusion   . Gout flare   . DVT (deep venous thrombosis) [facility])     Past Surgical History  Procedure Laterality Date  . Renal artery stent  2009    Right renal artery stenting-  Right Kidney  . Carotid endarterectomy  2008    right CEA  . Small intestine removed  2001    d/t random gi bleeding  . Esophagogastroduodenoscopy    . Cholecystectomy  mid 90's  . Colonoscopy    . Hernia repair      umbilical hernia  . Endarterectomy  06/02/2012    Procedure: ENDARTERECTOMY CAROTID;  Surgeon: Serafina Mitchell, MD;  Location: [place];  Service: Vascular;  Laterality: Left;  . Patch angioplasty  06/02/2012    Procedure: PATCH ANGIOPLASTY;  Surgeon: Serafina Mitchell, MD;  Location: [place];  Service: Vascular;  Laterality: Left;   using Vascu-Guard Patch  . Colon surgery      There were no vitals filed for this visit.  Visit Diagnosis:  Left-sided low back pain without sciatica  Stiffness of joints, multiple sites  Activity intolerance      Subjective Assessment - 08/27/15 1027    Subjective Reports that he woke up laying on L side and may have slept ackwardly. Reports pulling feeling in L low back and pain with movements in bed.   Pertinent History HTN   Patient Stated Goals decrease pain, increase strength and movement   Currently in Pain? Other (Comment)  Reports no pain at rest but pain with movement but no pain rating given            [facility] PT Assessment - 08/27/15 0001    Assessment   Medical Diagnosis L sided LBP without sciatica   Onset Date/Surgical Date 06/15/15   Next MD Visit March   Precautions   Precautions None   Restrictions   Weight Bearing Restrictions No                     [facility] Adult PT Treatment/Exercise - 08/27/15 0001    Lumbar Exercises: Standing   Other Standing  Lumbar Exercises Standing 7 stretch 3x30 sec   Lumbar Exercises: Supine   Other Supine Lumbar Exercises L Hip IR stretch in supine 3 x 30 sec   Modalities   Modalities Electrical Stimulation;Moist Heat;Ultrasound   Moist Heat Therapy   Number Minutes Moist Heat 15 Minutes   Moist Heat Location Lumbar Spine   Electrical Stimulation   Electrical Stimulation Location L low back   Electrical Stimulation Action Pre-Mod   Electrical Stimulation Parameters 80-150 Hz x15 min   Electrical Stimulation Goals Pain   Ultrasound   Ultrasound Location L lumbar QL/ lumbar paraspinals   Ultrasound Parameters 1.5 w/cm2, 100%,1 mhz x10 min   Ultrasound Goals Pain   Manual Therapy   Manual Therapy Myofascial release   Myofascial Release MFR to L QL, lumbar paraspinals, posterior hip musculature in R sidelying to decrease pain and tightness                     PT Long Term Goals - 08/21/15  0954    PT LONG TERM GOAL #1   Title I with HEP   Time 6   Period Weeks   Status On-going   PT LONG TERM GOAL #2   Title Able to perform ADLS with decreased pain in LB to 1/10 or less.   Time 6   Period Weeks   Status On-going   PT LONG TERM GOAL #3   Title Able to move around in bed with 1/10 LBP or less   Time 6   Period Weeks   Status On-going               Plan - 08/27/15 1037    Clinical Impression Statement Patient tolerated today's treatment fairly well although he reported low back discomfort upon waking this morning. Normal modalities response noted following end of the modaiities to decrease lumbar tightness. Tightness noted in L lumbar parspinals, QL and posterior hip musculature during manual therapy today. Tolerated stretches well today although transition movements still difficult and painful for patient even with use of log rolling. Patient experienced better mobility and decreased tightness but noted that he knew "its still there" following today's treatment.   Pt will benefit from skilled therapeutic intervention in order to improve on the following deficits Decreased range of motion;Increased muscle spasms;Decreased activity tolerance;Pain;Impaired flexibility;Decreased mobility;Decreased strength   Rehab Potential Excellent   PT Frequency 2x / week   PT Duration 6 weeks   PT Treatment/Interventions ADLs/Self Care Home Management;Electrical Stimulation;Moist Heat;Therapeutic exercise;Manual techniques;Passive range of motion;Patient/family education;Neuromuscular re-education;Ultrasound;Dry needling   PT Next Visit Plan STW to B paraspinals, spinal mobs, Add prone quad stretch, B hip flexibility, core strengthening. Estim/US for pain prn.   PT Home Exercise Plan Tr AB progression and "7" stretch for paraspinals.   Consulted and Agree with Plan of Care Patient        Problem List Patient Active Problem List   Diagnosis Date Noted  . Back pain 08/01/2015   . Gastroesophageal reflux disease with esophagitis 07/17/2015  . Essential hypertension, benign 07/17/2015  . S/P arterial stent 04/29/2015  . Renal artery stenosis ([place]) 12/26/2012  . Other and unspecified hyperlipidemia 10/11/2012  . Gout 10/11/2012  . Occlusion and stenosis of carotid artery without mention of cerebral infarction 10/12/2011    Wynelle Fanny, PTA 08/27/2015, 10:44 AM  [facility] [address] [place], [place], [address] Phone: [phone]   Fax:  [phone]  Name: James [name] MRN:  [id number] Date of Birth: [birth year]

## 2015-08-30 ENCOUNTER — Ambulatory Visit: Payer: Medicare Other | Attending: Family Medicine | Admitting: *Deleted

## 2015-08-30 ENCOUNTER — Encounter: Payer: Self-pay | Admitting: *Deleted

## 2015-08-30 DIAGNOSIS — M2560 Stiffness of unspecified joint, not elsewhere classified: Secondary | ICD-10-CM | POA: Diagnosis not present

## 2015-08-30 DIAGNOSIS — M545 Low back pain, unspecified: Secondary | ICD-10-CM

## 2015-08-30 DIAGNOSIS — R6889 Other general symptoms and signs: Secondary | ICD-10-CM | POA: Diagnosis not present

## 2015-08-30 NOTE — Therapy (Signed)
[facility] [place], [place], [address] Phone: [phone]   Fax:  [phone]  Physical Therapy Treatment  Patient Details  Name: James [name] MRN: [id number] Date of Birth: [birth year] Referring Provider: Kenn File, MD  Encounter Date: 08/30/2015      PT End of Session - 08/30/15 0926    Visit Number 5   Number of Visits 12   Date for PT Re-Evaluation 09/26/15   PT Start Time 0900   PT Stop Time 0951   PT Time Calculation (min) 51 min      Past Medical History  Diagnosis Date  . Arthritis   . Hyperlipidemia     takes Lipitor daily  . H/O: GI bleed   . Carotid artery occlusion   . H/O renal artery stenosis   . DVT (deep venous thrombosis) ([place]) 2008  . Hypertension     takes Metoprolol and Amlodipine daily  . Joint swelling   . Joint pain   . Gout     takes Allopurinol daily and Indomethacin prn  . GERD (gastroesophageal reflux disease)     takes Protonix daily  . History of gastric ulcer   . History of colon polyps   . History of blood transfusion   . Gout flare   . DVT (deep venous thrombosis) [facility])     Past Surgical History  Procedure Laterality Date  . Renal artery stent  2009    Right renal artery stenting-  Right Kidney  . Carotid endarterectomy  2008    right CEA  . Small intestine removed  2001    d/t random gi bleeding  . Esophagogastroduodenoscopy    . Cholecystectomy  mid 90's  . Colonoscopy    . Hernia repair      umbilical hernia  . Endarterectomy  06/02/2012    Procedure: ENDARTERECTOMY CAROTID;  Surgeon: Serafina Mitchell, MD;  Location: [place];  Service: Vascular;  Laterality: Left;  . Patch angioplasty  06/02/2012    Procedure: PATCH ANGIOPLASTY;  Surgeon: Serafina Mitchell, MD;  Location: [place];  Service: Vascular;  Laterality: Left;  using Vascu-Guard Patch  . Colon surgery      There were no vitals filed for this visit.  Visit Diagnosis:  Left-sided low back pain  without sciatica  Stiffness of joints, multiple sites  Activity intolerance      Subjective Assessment - 08/30/15 0902    Subjective Woke up with minimal LBP today. A good feeling   Patient Stated Goals decrease pain, increase strength and movement   Pain Location Back   Pain Orientation Left   Pain Type Acute pain   Pain Onset More than a month ago   Pain Frequency Intermittent   Pain Relieving Factors changing positions                         [facility] Adult PT Treatment/Exercise - 08/30/15 0001    Exercises   Exercises Lumbar;Knee/Hip   Lumbar Exercises: Stretches   Active Hamstring Stretch --   Single Knee to Chest Stretch 3 reps;30 seconds;Other (comment)  LLE   Piriformis Stretch 3 reps;30 seconds;Other (comment)  LLE   Lumbar Exercises: Supine   Ab Set 10 reps;5 seconds  Drawin   Clam 10 reps   Bent Knee Raise 10 reps  Marching   Modalities   Modalities Electrical Stimulation;Moist Heat;Ultrasound   Moist Heat Therapy   Number Minutes Moist Heat 15 Minutes  Moist Heat Location Lumbar Spine   Electrical Stimulation   Electrical Stimulation Location L low back Premod 80-150 hz x 15   Electrical Stimulation Goals Pain   Ultrasound   Ultrasound Location LT lumbar   Ultrasound Parameters 1.5 w/cm2 x 10 mins   Ultrasound Goals Pain                     PT Long Term Goals - 08/21/15 0954    PT LONG TERM GOAL #1   Title I with HEP   Time 6   Period Weeks   Status On-going   PT LONG TERM GOAL #2   Title Able to perform ADLS with decreased pain in LB to 1/10 or less.   Time 6   Period Weeks   Status On-going   PT LONG TERM GOAL #3   Title Able to move around in bed with 1/10 LBP or less   Time 6   Period Weeks   Status On-going               Plan - 08/30/15 0906    Clinical Impression Statement Pt was doing better today with minimal LBP when rising this morning. He was able to complete core exs today with minimal pain  and did better with bridging with Drawin. Normal response with modalities and pain had no pain when leaving.   Pt will benefit from skilled therapeutic intervention in order to improve on the following deficits Decreased range of motion;Increased muscle spasms;Decreased activity tolerance;Pain;Impaired flexibility;Decreased mobility;Decreased strength   Rehab Potential Excellent   PT Frequency 2x / week   PT Duration 6 weeks   PT Next Visit Plan STW to B paraspinals, spinal mobs, Add prone quad stretch, B hip flexibility, core strengthening. Estim/US for pain prn.   PT Home Exercise Plan Tr AB progression and "7" stretch for paraspinals.   Consulted and Agree with Plan of Care Patient        Problem List Patient Active Problem List   Diagnosis Date Noted  . Back pain 08/01/2015  . Gastroesophageal reflux disease with esophagitis 07/17/2015  . Essential hypertension, benign 07/17/2015  . S/P arterial stent 04/29/2015  . Renal artery stenosis ([place]) 12/26/2012  . Other and unspecified hyperlipidemia 10/11/2012  . Gout 10/11/2012  . Occlusion and stenosis of carotid artery without mention of cerebral infarction 10/12/2011    RAMSEUR,CHRIS, PTA 08/30/2015, 10:01 AM  [facility] [address] [place], [place], [address] Phone: [phone]   Fax:  [phone]  Name: James [name] MRN: [id number] Date of Birth: Nov 29, 1945

## 2015-09-02 ENCOUNTER — Ambulatory Visit: Payer: Medicare Other | Admitting: Physical Therapy

## 2015-09-02 ENCOUNTER — Encounter: Payer: Self-pay | Admitting: Physical Therapy

## 2015-09-02 DIAGNOSIS — R6889 Other general symptoms and signs: Secondary | ICD-10-CM

## 2015-09-02 DIAGNOSIS — M545 Low back pain, unspecified: Secondary | ICD-10-CM

## 2015-09-02 DIAGNOSIS — M2560 Stiffness of unspecified joint, not elsewhere classified: Secondary | ICD-10-CM

## 2015-09-02 NOTE — Therapy (Signed)
[facility] [place], [place], [address] Phone: [phone]   Fax:  [phone]  Physical Therapy Treatment  Patient Details  Name: James [name] MRN: [id number] Date of Birth: 05/09/1946 Referring Provider: Kenn File, MD  Encounter Date: 09/02/2015      PT End of Session - 09/02/15 1002    Visit Number 6   Number of Visits 12   Date for PT Re-Evaluation 09/26/15   PT Start Time 0916   PT Stop Time 1000   PT Time Calculation (min) 44 min   Activity Tolerance Patient tolerated treatment well;Patient limited by pain   Behavior During Therapy [facility] for tasks assessed/performed      Past Medical History  Diagnosis Date  . Arthritis   . Hyperlipidemia     takes Lipitor daily  . H/O: GI bleed   . Carotid artery occlusion   . H/O renal artery stenosis   . DVT (deep venous thrombosis) ([place]) 2008  . Hypertension     takes Metoprolol and Amlodipine daily  . Joint swelling   . Joint pain   . Gout     takes Allopurinol daily and Indomethacin prn  . GERD (gastroesophageal reflux disease)     takes Protonix daily  . History of gastric ulcer   . History of colon polyps   . History of blood transfusion   . Gout flare   . DVT (deep venous thrombosis) [facility])     Past Surgical History  Procedure Laterality Date  . Renal artery stent  2009    Right renal artery stenting-  Right Kidney  . Carotid endarterectomy  2008    right CEA  . Small intestine removed  2001    d/t random gi bleeding  . Esophagogastroduodenoscopy    . Cholecystectomy  mid 90's  . Colonoscopy    . Hernia repair      umbilical hernia  . Endarterectomy  06/02/2012    Procedure: ENDARTERECTOMY CAROTID;  Surgeon: Serafina Mitchell, MD;  Location: [place];  Service: Vascular;  Laterality: Left;  . Patch angioplasty  06/02/2012    Procedure: PATCH ANGIOPLASTY;  Surgeon: Serafina Mitchell, MD;  Location: [place];  Service: Vascular;  Laterality: Left;   using Vascu-Guard Patch  . Colon surgery      There were no vitals filed for this visit.  Visit Diagnosis:  Left-sided low back pain without sciatica  Stiffness of joints, multiple sites  Activity intolerance      Subjective Assessment - 09/02/15 0938    Subjective better after last treatment   Pertinent History HTN   Patient Stated Goals decrease pain, increase strength and movement   Currently in Pain? No/denies                         [facility] Adult PT Treatment/Exercise - 09/02/15 0001    Lumbar Exercises: Aerobic   Stationary Bike Nustep L4 x69min, draw in focus with hold relax, monitored for progresson, for core activation and strengthening   Moist Heat Therapy   Number Minutes Moist Heat 15 Minutes   Moist Heat Location Lumbar Spine   Electrical Stimulation   Electrical Stimulation Location L low back Premod 80-150 hz x 15   Electrical Stimulation Action premod   Electrical Stimulation Parameters 80-150hz    Electrical Stimulation Goals Pain   Ultrasound   Ultrasound Location lt lumbar   Ultrasound Parameters 1.5w/cm2/50%/50mhz x  10min   Ultrasound Goals  Pain                     PT Long Term Goals - 08/21/15 0954    PT LONG TERM GOAL #1   Title I with HEP   Time 6   Period Weeks   Status On-going   PT LONG TERM GOAL #2   Title Able to perform ADLS with decreased pain in LB to 1/10 or less.   Time 6   Period Weeks   Status On-going   PT LONG TERM GOAL #3   Title Able to move around in bed with 1/10 LBP or less   Time 6   Period Weeks   Status On-going               Plan - 09/02/15 1013    Clinical Impression Statement Patient tolerated treatment well today and reported he felt the best he has felt thus far. Today treatment on nustep with good hold relax core activation technique to strengthen back and to progress with lumbar strengthening. patient requested continued US/ES after ther ex today. goals ongoing at this time  due to pain deficits, yet if consistant will meet goals accordingly.   Pt will benefit from skilled therapeutic intervention in order to improve on the following deficits Decreased range of motion;Increased muscle spasms;Decreased activity tolerance;Pain;Impaired flexibility;Decreased mobility;Decreased strength   Rehab Potential Excellent   PT Frequency 2x / week   PT Duration 6 weeks   PT Treatment/Interventions ADLs/Self Care Home Management;Electrical Stimulation;Moist Heat;Therapeutic exercise;Manual techniques;Passive range of motion;Patient/family education;Neuromuscular re-education;Ultrasound;Dry needling   PT Next Visit Plan STW to B paraspinals, spinal mobs, Add prone quad stretch, B hip flexibility, core strengthening. Estim/US for pain prn.   Consulted and Agree with Plan of Care Patient        Problem List Patient Active Problem List   Diagnosis Date Noted  . Back pain 08/01/2015  . Gastroesophageal reflux disease with esophagitis 07/17/2015  . Essential hypertension, benign 07/17/2015  . S/P arterial stent 04/29/2015  . Renal artery stenosis ([place]) 12/26/2012  . Other and unspecified hyperlipidemia 10/11/2012  . Gout 10/11/2012  . Occlusion and stenosis of carotid artery without mention of cerebral infarction 10/12/2011    Phillips Climes, PTA 09/02/2015, 10:21 AM  [facility] [place], [place], [address] Phone: [phone]   Fax:  [phone]  Name: James [name] MRN: [id number] Date of Birth: [birth year]

## 2015-09-05 ENCOUNTER — Ambulatory Visit: Payer: Medicare Other | Admitting: Physical Therapy

## 2015-09-05 ENCOUNTER — Encounter: Payer: Self-pay | Admitting: Physical Therapy

## 2015-09-05 DIAGNOSIS — M545 Low back pain, unspecified: Secondary | ICD-10-CM

## 2015-09-05 DIAGNOSIS — R6889 Other general symptoms and signs: Secondary | ICD-10-CM | POA: Diagnosis not present

## 2015-09-05 DIAGNOSIS — M2560 Stiffness of unspecified joint, not elsewhere classified: Secondary | ICD-10-CM | POA: Diagnosis not present

## 2015-09-05 NOTE — Therapy (Signed)
[facility] [place], [place], [address] Phone: [phone]   Fax:  [phone]  Physical Therapy Treatment  Patient Details  Name: James [name] MRN: [id number] Date of Birth: 02/07/1946 Referring Provider: Kenn File, MD  Encounter Date: 09/05/2015      PT End of Session - 09/05/15 0900    Visit Number 7   Number of Visits 12   Date for PT Re-Evaluation 09/26/15   PT Start Time 0900   PT Stop Time 0940   PT Time Calculation (min) 40 min   Activity Tolerance Patient tolerated treatment well   Behavior During Therapy [facility] for tasks assessed/performed      Past Medical History  Diagnosis Date  . Arthritis   . Hyperlipidemia     takes Lipitor daily  . H/O: GI bleed   . Carotid artery occlusion   . H/O renal artery stenosis   . DVT (deep venous thrombosis) ([place]) 2008  . Hypertension     takes Metoprolol and Amlodipine daily  . Joint swelling   . Joint pain   . Gout     takes Allopurinol daily and Indomethacin prn  . GERD (gastroesophageal reflux disease)     takes Protonix daily  . History of gastric ulcer   . History of colon polyps   . History of blood transfusion   . Gout flare   . DVT (deep venous thrombosis) [facility])     Past Surgical History  Procedure Laterality Date  . Renal artery stent  2009    Right renal artery stenting-  Right Kidney  . Carotid endarterectomy  2008    right CEA  . Small intestine removed  2001    d/t random gi bleeding  . Esophagogastroduodenoscopy    . Cholecystectomy  mid 90's  . Colonoscopy    . Hernia repair      umbilical hernia  . Endarterectomy  06/02/2012    Procedure: ENDARTERECTOMY CAROTID;  Surgeon: Serafina Mitchell, MD;  Location: [facility];  Service: Vascular;  Laterality: Left;  . Patch angioplasty  06/02/2012    Procedure: PATCH ANGIOPLASTY;  Surgeon: Serafina Mitchell, MD;  Location: [place];  Service: Vascular;  Laterality: Left;  using Vascu-Guard Patch   . Colon surgery      There were no vitals filed for this visit.  Visit Diagnosis:  Left-sided low back pain without sciatica  Stiffness of joints, multiple sites  Activity intolerance      Subjective Assessment - 09/05/15 0903    Subjective Patient states that he is much better and inquired about D/C from therapy. Reports bed mobilty and ADLs easier but reports pain only with certain positions. States that he is no longer experiencing LE symptoms.   Pertinent History HTN   Patient Stated Goals decrease pain, increase strength and movement   Currently in Pain? No/denies            [facility] PT Assessment - 09/05/15 0001    Assessment   Medical Diagnosis L sided LBP without sciatica   Onset Date/Surgical Date 06/15/15   Next MD Visit March   Precautions   Precautions None   Restrictions   Weight Bearing Restrictions No                     [facility] Adult PT Treatment/Exercise - 09/05/15 0001    Lumbar Exercises: Aerobic   Stationary Bike L6 x15 min   Lumbar Exercises: Machines for Strengthening  Cybex Lumbar Extension 50# 3x10 reps   Other Lumbar Machine Exercise 60# 3x10 reps   Lumbar Exercises: Standing   Lifting Other (comment)  from low plinth with 14# box 3x10 reps   Row Strengthening;Both;Other (comment)  3x10 reps with Pink XTS   Shoulder Extension Strengthening;Both  3x10 reps with Pink XTS   Lumbar Exercises: Supine   Clam Other (comment)  3x10 reps yellow theraband   Bent Knee Raise Other (comment)  3x10 reps yellow theraband   Bridge Other (comment)  3x10 reps with lumbar pull reported but no pain   Straight Leg Raise Other (comment)  BLE 3x10 reps each                     PT Long Term Goals - 09-11-15 0901    PT LONG TERM GOAL #1   Title I with HEP   Time 6   Period Weeks   Status Achieved   PT LONG TERM GOAL #2   Title Able to perform ADLS with decreased pain in LB to 1/10 or less.   Time 6   Period Weeks   Status  Partially Met  Patient states that he can do ADLs around home and only has pain with certain positions per patient report 2015/09/11   PT LONG TERM GOAL #3   Title Able to move around in bed with 1/10 LBP or less   Time 6   Period Weeks   Status Partially Met  Patient states that bed mobility easier and only has pain with awakened due to ackward positioning per patient report 09-11-2015               Plan - 11-Sep-2015 0944    Clinical Impression Statement Patient tolerated today's treatment with no reports of pain prior to or after today's treatment. All exercises completed today with VCs for core activation to further core activation. Patient completed all exercises without complaint of pain only a pull with supine bridging. Patient demonstrated good posture following multimodal cueing for proper squat positioning. Patient educated to avoid twisting and rotation motion to not exaggerate low back pain. Patient has achieved most goals set at evaluation although two goals partially achieved secondary to patient reporting certain movements causing pain.   Pt will benefit from skilled therapeutic intervention in order to improve on the following deficits Decreased range of motion;Increased muscle spasms;Decreased activity tolerance;Pain;Impaired flexibility;Decreased mobility;Decreased strength   Rehab Potential Excellent   PT Frequency 2x / week   PT Duration 6 weeks   PT Treatment/Interventions ADLs/Self Care Home Management;Electrical Stimulation;Moist Heat;Therapeutic exercise;Manual techniques;Passive range of motion;Patient/family education;Neuromuscular re-education;Ultrasound;Dry needling   PT Next Visit Plan Communicate need for D/C summary to MPT.   PT Home Exercise Plan Tr AB progression and "7" stretch for paraspinals.   Consulted and Agree with Plan of Care Patient          G-Codes - 09/11/15 1414    Functional Assessment Tool Used FOTO.  16% at discharge.   Functional  Limitation Mobility: Walking and moving around   Mobility: Walking and Moving Around Current Status [phone]) At least 1 percent but less than 20 percent impaired, limited or restricted   Mobility: Walking and Moving Around Goal Status [phone]) At least 20 percent but less than 40 percent impaired, limited or restricted   Mobility: Walking and Moving Around Discharge Status [phone]) At least 1 percent but less than 20 percent impaired, limited or restricted      Problem List  Patient Active Problem List   Diagnosis Date Noted  . Back pain 08/01/2015  . Gastroesophageal reflux disease with esophagitis 07/17/2015  . Essential hypertension, benign 07/17/2015  . S/P arterial stent 04/29/2015  . Renal artery stenosis ([place]) 12/26/2012  . Other and unspecified hyperlipidemia 10/11/2012  . Gout 10/11/2012  . Occlusion and stenosis of carotid artery without mention of cerebral infarction 10/12/2011   PHYSICAL THERAPY DISCHARGE SUMMARY  Visits from Start of Care: 7  Current functional level related to goals / functional outcomes: Please see above.   Remaining deficits: Goals essentially met.   Education / Equipment: HEP.  Plan: Patient agrees to discharge.  Patient goals were partially met. Patient is being discharged due to being pleased with the current functional level.  ?????      APPLEGATE, [place] MPT 09/05/2015, 2:15 PM  [facility] [address] [place], [place], [address] Phone: [phone]   Fax:  [phone]  Name: TYCHO CHERAMIE MRN: [id number] Date of Birth: February 24, 1946

## 2015-09-05 NOTE — Therapy (Signed)
[facility] [place], [place], [address] Phone: [phone]   Fax:  [phone]  Physical Therapy Treatment  Patient Details  Name: James [name] MRN: [id number] Date of Birth: 11/20/1945 Referring Provider: Kenn File, MD  Encounter Date: 09/05/2015      PT End of Session - 09/05/15 0900    Visit Number 7   Number of Visits 12   Date for PT Re-Evaluation 09/26/15   PT Start Time 0900   PT Stop Time 0940   PT Time Calculation (min) 40 min   Activity Tolerance Patient tolerated treatment well   Behavior During Therapy [facility] for tasks assessed/performed      Past Medical History  Diagnosis Date  . Arthritis   . Hyperlipidemia     takes Lipitor daily  . H/O: GI bleed   . Carotid artery occlusion   . H/O renal artery stenosis   . DVT (deep venous thrombosis) ([place]) 2008  . Hypertension     takes Metoprolol and Amlodipine daily  . Joint swelling   . Joint pain   . Gout     takes Allopurinol daily and Indomethacin prn  . GERD (gastroesophageal reflux disease)     takes Protonix daily  . History of gastric ulcer   . History of colon polyps   . History of blood transfusion   . Gout flare   . DVT (deep venous thrombosis) [facility])     Past Surgical History  Procedure Laterality Date  . Renal artery stent  2009    Right renal artery stenting-  Right Kidney  . Carotid endarterectomy  2008    right CEA  . Small intestine removed  2001    d/t random gi bleeding  . Esophagogastroduodenoscopy    . Cholecystectomy  mid 90's  . Colonoscopy    . Hernia repair      umbilical hernia  . Endarterectomy  06/02/2012    Procedure: ENDARTERECTOMY CAROTID;  Surgeon: Serafina Mitchell, MD;  Location: [facility];  Service: Vascular;  Laterality: Left;  . Patch angioplasty  06/02/2012    Procedure: PATCH ANGIOPLASTY;  Surgeon: Serafina Mitchell, MD;  Location: [facility];  Service: Vascular;  Laterality: Left;  using Vascu-Guard Patch   . Colon surgery      There were no vitals filed for this visit.  Visit Diagnosis:  Left-sided low back pain without sciatica  Stiffness of joints, multiple sites  Activity intolerance      Subjective Assessment - 09/05/15 0903    Subjective Patient states that he is much better and inquired about D/C from therapy. Reports bed mobilty and ADLs easier but reports pain only with certain positions. States that he is no longer experiencing LE symptoms.   Pertinent History HTN   Patient Stated Goals decrease pain, increase strength and movement   Currently in Pain? No/denies            [facility] PT Assessment - 09/05/15 0001    Assessment   Medical Diagnosis L sided LBP without sciatica   Onset Date/Surgical Date 06/15/15   Next MD Visit March   Precautions   Precautions None   Restrictions   Weight Bearing Restrictions No                     [facility] Adult PT Treatment/Exercise - 09/05/15 0001    Lumbar Exercises: Aerobic   Stationary Bike L6 x15 min   Lumbar Exercises: Machines for Strengthening  Cybex Lumbar Extension 50# 3x10 reps   Other Lumbar Machine Exercise 60# 3x10 reps   Lumbar Exercises: Standing   Lifting Other (comment)  from low plinth with 14# box 3x10 reps   Row Strengthening;Both;Other (comment)  3x10 reps with Pink XTS   Shoulder Extension Strengthening;Both  3x10 reps with Pink XTS   Lumbar Exercises: Supine   Clam Other (comment)  3x10 reps yellow theraband   Bent Knee Raise Other (comment)  3x10 reps yellow theraband   Bridge Other (comment)  3x10 reps with lumbar pull reported but no pain   Straight Leg Raise Other (comment)  BLE 3x10 reps each                     PT Long Term Goals - 09/05/15 0901    PT LONG TERM GOAL #1   Title I with HEP   Time 6   Period Weeks   Status Achieved   PT LONG TERM GOAL #2   Title Able to perform ADLS with decreased pain in LB to 1/10 or less.   Time 6   Period Weeks   Status  Partially Met  Patient states that he can do ADLs around home and only has pain with certain positions per patient report 09/05/2015   PT LONG TERM GOAL #3   Title Able to move around in bed with 1/10 LBP or less   Time 6   Period Weeks   Status Partially Met  Patient states that bed mobility easier and only has pain with awakened due to ackward positioning per patient report 09/05/2015               Plan - 09/05/15 0944    Clinical Impression Statement Patient tolerated today's treatment with no reports of pain prior to or after today's treatment. All exercises completed today with VCs for core activation to further core activation. Patient completed all exercises without complaint of pain only a pull with supine bridging. Patient demonstrated good posture following multimodal cueing for proper squat positioning. Patient educated to avoid twisting and rotation motion to not exaggerate low back pain. Patient has achieved most goals set at evaluation although two goals partially achieved secondary to patient reporting certain movements causing pain.   Pt will benefit from skilled therapeutic intervention in order to improve on the following deficits Decreased range of motion;Increased muscle spasms;Decreased activity tolerance;Pain;Impaired flexibility;Decreased mobility;Decreased strength   Rehab Potential Excellent   PT Frequency 2x / week   PT Duration 6 weeks   PT Treatment/Interventions ADLs/Self Care Home Management;Electrical Stimulation;Moist Heat;Therapeutic exercise;Manual techniques;Passive range of motion;Patient/family education;Neuromuscular re-education;Ultrasound;Dry needling   PT Next Visit Plan Communicate need for D/C summary to MPT.   PT Home Exercise Plan Tr AB progression and "7" stretch for paraspinals.   Consulted and Agree with Plan of Care Patient        Problem List Patient Active Problem List   Diagnosis Date Noted  . Back pain 08/01/2015  .  Gastroesophageal reflux disease with esophagitis 07/17/2015  . Essential hypertension, benign 07/17/2015  . S/P arterial stent 04/29/2015  . Renal artery stenosis ([place]) 12/26/2012  . Other and unspecified hyperlipidemia 10/11/2012  . Gout 10/11/2012  . Occlusion and stenosis of carotid artery without mention of cerebral infarction 10/12/2011    Ahmed Prima, PTA 09/05/2015 9:58 AM  [facility] [address] [place], [place], [address] Phone: [phone]   Fax:  [phone]  Name: James [name] MRN:  [id number] Date of Birth: [birth year]

## 2015-10-04 ENCOUNTER — Encounter: Payer: Self-pay | Admitting: *Deleted

## 2015-10-18 ENCOUNTER — Encounter: Payer: Self-pay | Admitting: Surgery

## 2015-10-21 ENCOUNTER — Ambulatory Visit: Payer: Medicare Other | Admitting: Family Medicine

## 2015-10-28 ENCOUNTER — Ambulatory Visit (HOSPITAL_COMMUNITY)
Admission: RE | Admit: 2015-10-28 | Discharge: 2015-10-28 | Disposition: A | Discharge status: Home / Self Care | Payer: Medicare Other | Source: Ambulatory Visit | Attending: Surgery | Admitting: Surgery

## 2015-10-28 ENCOUNTER — Ambulatory Visit (INDEPENDENT_AMBULATORY_CARE_PROVIDER_SITE_OTHER): Payer: Medicare Other | Admitting: Surgery

## 2015-10-28 ENCOUNTER — Encounter: Payer: Self-pay | Admitting: Surgery

## 2015-10-28 VITALS — BP 202/94 | HR 66 | Ht 70.0 in | Wt 184.0 lb

## 2015-10-28 DIAGNOSIS — I10 Essential (primary) hypertension: Secondary | ICD-10-CM | POA: Insufficient documentation

## 2015-10-28 DIAGNOSIS — I701 Atherosclerosis of renal artery: Secondary | ICD-10-CM | POA: Diagnosis not present

## 2015-10-28 DIAGNOSIS — K219 Gastro-esophageal reflux disease without esophagitis: Secondary | ICD-10-CM | POA: Insufficient documentation

## 2015-10-28 DIAGNOSIS — Z959 Presence of cardiac and vascular implant and graft, unspecified: Secondary | ICD-10-CM | POA: Insufficient documentation

## 2015-10-28 DIAGNOSIS — E785 Hyperlipidemia, unspecified: Secondary | ICD-10-CM | POA: Diagnosis not present

## 2015-10-28 NOTE — Progress Notes (Signed)
[facility]  Patient name: James [name] MRN: [id number] DOB: [birth year] Sex: male  REASON FOR VISIT: follow up  HPI: The patient is back today for followup. He is status post left carotid endarterectomy for asymptomatic stenosis in November of 2013. He is status post right carotid endarterectomy in 2008. He has also undergone a right renal artery stenting. His blood pressure has been trending up. His creatinine to 2 years ago was 1.2. Last year was 1.4. It was back down to 1.2 in November 2016.  He reports some difficulty with his blood pressure and suffers from whitecoat hypertension.  He is also concerned about gout in his left foot.  He is scheduled see his medical doctor Monday  Past Medical History  Diagnosis Date  . Arthritis   . Hyperlipidemia     takes Lipitor daily  . H/O: GI bleed   . Carotid artery occlusion   . H/O renal artery stenosis   . DVT (deep venous thrombosis) ([place]) 2008  . Hypertension     takes Metoprolol and Amlodipine daily  . Joint swelling   . Joint pain   . Gout     takes Allopurinol daily and Indomethacin prn  . GERD (gastroesophageal reflux disease)     takes Protonix daily  . History of gastric ulcer   . History of colon polyps   . History of blood transfusion   . Gout flare   . DVT (deep venous thrombosis) ([facility])     Family History  Problem Relation Age of Onset  . Hypertension Mother   . Hyperlipidemia Mother   . Other Mother     varicose veins  . Hyperlipidemia Father   . Hypertension Father   . Diabetes Father   . Clotting disorder Father   . Deep vein thrombosis Father   . Hypertension Sister   . Diabetes Sister   . Hyperlipidemia Sister   . Varicose Veins Sister   . Other Sister     Bleeding problems  . Hypertension Brother   . Hyperlipidemia Brother     SOCIAL HISTORY: Social History  Substance Use Topics  . Smoking status: Former Smoker    Quit date: 01/02/2000  . Smokeless  tobacco: Former Systems developer    Quit date: 12/26/1999  . Alcohol Use: No     Comment: Quit in 1998    Allergies  Allergen Reactions  . Plavix [Clopidogrel Bisulfate] Other (See Comments)    GI Bleed    Current Outpatient Prescriptions  Medication Sig Dispense Refill  . amLODipine (NORVASC) 10 MG tablet TAKE 1 TABLET DAILY 90 tablet 1  . aspirin 81 MG tablet Take 81 mg by mouth 2 (two) times daily.     Marland Kitchen atorvastatin (LIPITOR) 40 MG tablet TAKE 1 TABLET DAILY 90 tablet 1  . indomethacin (INDOCIN) 50 MG capsule TAKE AS NEEDED 30 capsule 02  . metoprolol (LOPRESSOR) 50 MG tablet TAKE 1 TABLET DAILY 90 tablet 1  . pantoprazole (PROTONIX) 40 MG tablet TAKE 1 TABLET DAILY 90 tablet 1  . cyclobenzaprine (FLEXERIL) 5 MG tablet Take 1 tablet (5 mg total) by mouth 3 (three) times daily as needed for muscle spasms. (Patient not taking: Reported on 10/28/2015) 30 tablet 1  . traMADol (ULTRAM) 50 MG tablet Take 1 tablet (50 mg total) by mouth every 8 (eight) hours as needed. (Patient not taking: Reported on 10/28/2015) 30 tablet 0   No current facility-administered medications for this visit.  REVIEW OF SYSTEMS:  [X]  denotes positive finding, [ ]  denotes negative finding Cardiac  Comments:  Chest pain or chest pressure:    Shortness of breath upon exertion:    Short of breath when lying flat:    Irregular heart rhythm:        Vascular    Pain in calf, thigh, or hip brought on by ambulation:    Pain in feet at night that wakes you up from your sleep:     Blood clot in your veins:    Leg swelling:  x Gout       Pulmonary    Oxygen at home:    Productive cough:     Wheezing:         Neurologic    Sudden weakness in arms or legs:     Sudden numbness in arms or legs:     Sudden onset of difficulty speaking or slurred speech:    Temporary loss of vision in one eye:     Problems with dizziness:         Gastrointestinal    Blood in stool:     Vomited blood:         Genitourinary    Burning  when urinating:     Blood in urine:        Psychiatric    Major depression:         Hematologic    Bleeding problems:    Problems with blood clotting too easily:        Skin    Rashes or ulcers:        Constitutional    Fever or chills:      PHYSICAL EXAM: Filed Vitals:   10/28/15 0931 10/28/15 0933  BP: 200/94 202/94  Pulse: 66   Height: 5\' 10"  (1.778 m)   Weight: 184 lb (83.462 kg)   SpO2: 98%     GENERAL: The patient is a well-nourished male, in no acute distress. The vital signs are documented above. CARDIAC: There is a regular rate and rhythm.  VASCULAR: No carotid bruits.  Right sided abdominal bruit PULMONARY: There is good air exchange bilaterally without wheezing or rales. ABDOMEN: Soft and non-tender with normal pitched bowel sounds.  MUSCULOSKELETAL: There are no major deformities or cyanosis. NEUROLOGIC: No focal weakness or paresthesias are detected. SKIN: There are no ulcers or rashes noted. PSYCHIATRIC: The patient has a normal affect.  DATA:  The patient's last carotid Doppler studies were a year ago and showed less than 39% stenosis bilaterally.  Patient had a renal duplex today that shows in-stent stenosis on the right with peak velocity of 4 28 cm/s.  The right kidney is 9 cm the left is 11 cm  MEDICAL ISSUES: Carotid stenosis: Patient will have routine carotid Doppler studies in June as previously scheduled  Renal artery stenosis: I discussed the ultrasound findings today with the patient.  There is a discrepancy in the size of his right and left kidney with elevated velocities within the right renal stent.  Therefore I have recommended that the patient undergo abdominal aortogram with focus on the right renal stent and intervention as indicated.  The patient will contact me with the most appropriate timing.   Annamarie Major [facility] [employer]: [phone]

## 2015-10-31 ENCOUNTER — Other Ambulatory Visit: Payer: Self-pay

## 2015-11-04 ENCOUNTER — Encounter: Payer: Self-pay | Admitting: Family Medicine

## 2015-11-04 ENCOUNTER — Ambulatory Visit (INDEPENDENT_AMBULATORY_CARE_PROVIDER_SITE_OTHER): Payer: Medicare Other | Admitting: Family Medicine

## 2015-11-04 VITALS — BP 179/84 | HR 71 | Temp 98.6°F | Ht 70.0 in | Wt 183.4 lb

## 2015-11-04 DIAGNOSIS — E785 Hyperlipidemia, unspecified: Secondary | ICD-10-CM

## 2015-11-04 DIAGNOSIS — I701 Atherosclerosis of renal artery: Secondary | ICD-10-CM

## 2015-11-04 DIAGNOSIS — I10 Essential (primary) hypertension: Secondary | ICD-10-CM | POA: Diagnosis not present

## 2015-11-04 DIAGNOSIS — G4730 Sleep apnea, unspecified: Secondary | ICD-10-CM | POA: Diagnosis not present

## 2015-11-04 NOTE — Progress Notes (Signed)
BP 179/84 mmHg  Pulse 71  Temp(Src) 98.6 F (37 C) (Oral)  Ht 5' 10"  (1.778 m)  Wt 183 lb 6.4 oz (83.19 kg)  BMI 26.32 kg/m2   Subjective:    Patient ID: James [name], male    DOB: 06-27-1946, 70 y.o.   MRN: [id number]  HPI: James [name] is a 70 y.o. male presenting on 11/04/2015 for Hyperlipidemia and Hypertension   HPI Hypertension Patient comes in today for hypertension recheck. His blood pressure today is 179/84. He admits that he did not take his medication this morning. He didn't take his Norvasc last night though. He says his blood pressure is always running higher in the office then when it runs at home. He says over the past few weeks his blood pressure has been running in the 150s over 70s at home. Patient denies headaches, blurred vision, chest pains, shortness of breath, or weakness. Denies any side effects from medication and is content with current medication. She is urologist is talking about removing the renal artery stent that he has been seen if his blood pressure improved after that.  Hyperlipidemia Patient is coming in today for a cholesterol recheck. He is currently taking atorvastatin 40 mg. His cholesterol was controlled last time he came in.  Sleep apnea symptoms Patient has been told by family members that he snores like a "freight train". They have told him that he probably needs get checked out for sleep apnea. He also admits that he has nonrestorative sleep and can fall asleep easily throughout the day. He also has hypertension that is not well controlled.  Relevant past medical, surgical, family and social history reviewed and updated as indicated. Interim medical history since our last visit reviewed. Allergies and medications reviewed and updated.  Review of Systems  Constitutional: Negative for fever, chills and unexpected weight change.  HENT: Negative for congestion, ear discharge and ear pain.   Eyes: Negative for discharge and visual  disturbance.  Respiratory: Negative for cough, chest tightness, shortness of breath and wheezing.   Cardiovascular: Negative for chest pain and leg swelling.  Gastrointestinal: Negative for abdominal pain, diarrhea and constipation.  Genitourinary: Negative for difficulty urinating.  Musculoskeletal: Negative for back pain and gait problem.  Skin: Negative for rash.  Neurological: Negative for dizziness, syncope, light-headedness and headaches.  Psychiatric/Behavioral: Positive for sleep disturbance.  All other systems reviewed and are negative.   Per HPI unless specifically indicated above     Medication List       This list is accurate as of: 11/04/15  9:21 AM.  Always use your most recent med list.               amLODipine 10 MG tablet  Commonly known as:  NORVASC  TAKE 1 TABLET DAILY     aspirin 81 MG tablet  Take 81 mg by mouth 2 (two) times daily.     atorvastatin 40 MG tablet  Commonly known as:  LIPITOR  TAKE 1 TABLET DAILY     indomethacin 50 MG capsule  Commonly known as:  INDOCIN  TAKE AS NEEDED     metoprolol 50 MG tablet  Commonly known as:  LOPRESSOR  TAKE 1 TABLET DAILY     pantoprazole 40 MG tablet  Commonly known as:  PROTONIX  TAKE 1 TABLET DAILY           Objective:    BP 179/84 mmHg  Pulse 71  Temp(Src) 98.6 F (37 C) (Oral)  Ht 5' 10"  (1.778 m)  Wt 183 lb 6.4 oz (83.19 kg)  BMI 26.32 kg/m2  Wt Readings from Last 3 Encounters:  11/04/15 183 lb 6.4 oz (83.19 kg)  10/28/15 184 lb (83.462 kg)  08/01/15 194 lb 3.2 oz (88.089 kg)    Physical Exam  Constitutional: He is oriented to person, place, and time. He appears well-developed and well-nourished. No distress.  Eyes: Conjunctivae and EOM are normal. Pupils are equal, round, and reactive to light. Right eye exhibits no discharge. No scleral icterus.  Neck: Neck supple. No thyromegaly present.  Cardiovascular: Normal rate, regular rhythm, normal heart sounds and intact distal  pulses.   No murmur heard. Pulmonary/Chest: Effort normal and breath sounds normal. No respiratory distress. He has no wheezes.  Musculoskeletal: Normal range of motion. He exhibits no edema.  Lymphadenopathy:    He has no cervical adenopathy.  Neurological: He is alert and oriented to person, place, and time. Coordination normal.  Skin: Skin is warm and dry. No rash noted. He is not diaphoretic.  Psychiatric: He has a normal mood and affect. His behavior is normal.  Nursing note and vitals reviewed.     Assessment & Plan:   Problem List Items Addressed This Visit      Cardiovascular and Mediastinum   Essential hypertension, benign - Primary   Relevant Orders   CMP14+EGFR     Other   Hyperlipidemia LDL goal <100   Relevant Orders   Lipid panel    Other Visit Diagnoses    Sleep apnea        Relevant Orders    Ambulatory referral to Sleep Studies       Follow up plan: Return in about 3 months (around 02/03/2016), or if symptoms worsen or fail to improve, for Hypertension recheck.  Counseling provided for all of the vaccine components Orders Placed This Encounter  Procedures  . CMP14+EGFR  . Lipid panel  . Ambulatory referral to Sleep Studies    Caryl Pina, MD [facility] 11/04/2015, 9:21 AM

## 2015-11-05 LAB — CMP14+EGFR
ALT: 14 IU/L (ref 0–44)
AST: 21 IU/L (ref 0–40)
Albumin/Globulin Ratio: 1.6 (ref 1.2–2.2)
Albumin: 4.4 g/dL (ref 3.5–4.8)
Alkaline Phosphatase: 153 IU/L — ABNORMAL HIGH (ref 39–117)
BUN/Creatinine Ratio: 11 (ref 10–24)
BUN: 14 mg/dL (ref 8–27)
Bilirubin Total: 1.4 mg/dL — ABNORMAL HIGH (ref 0.0–1.2)
CO2: 25 mmol/L (ref 18–29)
Calcium: 9.3 mg/dL (ref 8.6–10.2)
Chloride: 99 mmol/L (ref 96–106)
Creatinine, Ser: 1.29 mg/dL — ABNORMAL HIGH (ref 0.76–1.27)
GFR calc Af Amer: 65 mL/min/{1.73_m2} (ref >59)
GFR calc non Af Amer: 56 mL/min/{1.73_m2} — ABNORMAL LOW (ref >59)
Globulin, Total: 2.8 g/dL (ref 1.5–4.5)
Glucose: 112 mg/dL — ABNORMAL HIGH (ref 65–99)
Potassium: 3.2 mmol/L — ABNORMAL LOW (ref 3.5–5.2)
Sodium: 144 mmol/L (ref 134–144)
Total Protein: 7.2 g/dL (ref 6.0–8.5)

## 2015-11-05 LAB — LIPID PANEL
Chol/HDL Ratio: 2.7 ratio units (ref 0.0–5.0)
Cholesterol, Total: 159 mg/dL (ref 100–199)
HDL: 59 mg/dL (ref >39)
LDL Calculated: 77 mg/dL (ref 0–99)
Triglycerides: 117 mg/dL (ref 0–149)
VLDL Cholesterol Cal: 23 mg/dL (ref 5–40)

## 2015-12-03 ENCOUNTER — Ambulatory Visit (HOSPITAL_COMMUNITY)
Admission: RE | Admit: 2015-12-03 | Discharge: 2015-12-03 | Disposition: A | Discharge status: Home / Self Care | Payer: Medicare Other | Source: Ambulatory Visit | Attending: Surgery | Admitting: Surgery

## 2015-12-03 ENCOUNTER — Encounter (HOSPITAL_COMMUNITY)
Admission: RE | Disposition: A | Discharge status: Home / Self Care | Payer: Self-pay | Source: Ambulatory Visit | Attending: Surgery

## 2015-12-03 DIAGNOSIS — E785 Hyperlipidemia, unspecified: Secondary | ICD-10-CM | POA: Diagnosis not present

## 2015-12-03 DIAGNOSIS — M109 Gout, unspecified: Secondary | ICD-10-CM | POA: Diagnosis not present

## 2015-12-03 DIAGNOSIS — M1990 Unspecified osteoarthritis, unspecified site: Secondary | ICD-10-CM | POA: Insufficient documentation

## 2015-12-03 DIAGNOSIS — I10 Essential (primary) hypertension: Secondary | ICD-10-CM | POA: Insufficient documentation

## 2015-12-03 DIAGNOSIS — I701 Atherosclerosis of renal artery: Secondary | ICD-10-CM | POA: Insufficient documentation

## 2015-12-03 DIAGNOSIS — T82858A Stenosis of vascular prosthetic devices, implants and grafts, initial encounter: Secondary | ICD-10-CM | POA: Diagnosis not present

## 2015-12-03 DIAGNOSIS — Z8719 Personal history of other diseases of the digestive system: Secondary | ICD-10-CM | POA: Insufficient documentation

## 2015-12-03 DIAGNOSIS — I6529 Occlusion and stenosis of unspecified carotid artery: Secondary | ICD-10-CM | POA: Diagnosis not present

## 2015-12-03 DIAGNOSIS — K219 Gastro-esophageal reflux disease without esophagitis: Secondary | ICD-10-CM | POA: Diagnosis not present

## 2015-12-03 DIAGNOSIS — Y812 Prosthetic and other implants, materials and accessory general- and plastic-surgery devices associated with adverse incidents: Secondary | ICD-10-CM | POA: Insufficient documentation

## 2015-12-03 DIAGNOSIS — Z87891 Personal history of nicotine dependence: Secondary | ICD-10-CM | POA: Insufficient documentation

## 2015-12-03 DIAGNOSIS — Z86010 Personal history of colonic polyps: Secondary | ICD-10-CM | POA: Diagnosis not present

## 2015-12-03 DIAGNOSIS — Z86718 Personal history of other venous thrombosis and embolism: Secondary | ICD-10-CM | POA: Diagnosis not present

## 2015-12-03 DIAGNOSIS — Z7982 Long term (current) use of aspirin: Secondary | ICD-10-CM | POA: Diagnosis not present

## 2015-12-03 DIAGNOSIS — Z8249 Family history of ischemic heart disease and other diseases of the circulatory system: Secondary | ICD-10-CM | POA: Diagnosis not present

## 2015-12-03 HISTORY — PX: PERIPHERAL VASCULAR CATHETERIZATION: SHX172C

## 2015-12-03 LAB — POCT I-STAT, CHEM 8
BUN: 13 mg/dL (ref 6–20)
Calcium, Ion: 1.06 mmol/L — ABNORMAL LOW (ref 1.13–1.30)
Chloride: 99 mmol/L — ABNORMAL LOW (ref 101–111)
Creatinine, Ser: 1.4 mg/dL — ABNORMAL HIGH (ref 0.61–1.24)
Glucose, Bld: 104 mg/dL — ABNORMAL HIGH (ref 65–99)
HCT: 41 % (ref 39.0–52.0)
Hemoglobin: 13.9 g/dL (ref 13.0–17.0)
Potassium: 3.1 mmol/L — ABNORMAL LOW (ref 3.5–5.1)
Sodium: 140 mmol/L (ref 135–145)
TCO2: 28 mmol/L (ref 0–100)

## 2015-12-03 LAB — POCT ACTIVATED CLOTTING TIME
Activated Clotting Time: 223 seconds
Activated Clotting Time: 193 seconds
Activated Clotting Time: 178 seconds

## 2015-12-03 SURGERY — PERIPHERAL VASCULAR BALLOON ANGIOPLASTY
Laterality: Right

## 2015-12-03 MED ORDER — GUAIFENESIN-DM 100-10 MG/5ML PO SYRP
15.0000 mL | ORAL_SOLUTION | ORAL | Status: DC | PRN
  Filled 2015-12-03: qty 15

## 2015-12-03 MED ORDER — MIDAZOLAM HCL 2 MG/2ML IJ SOLN
INTRAMUSCULAR | Status: DC | PRN
  Administered 2015-12-03: 1 mg via INTRAVENOUS

## 2015-12-03 MED ORDER — HEPARIN (PORCINE) IN NACL 2-0.9 UNIT/ML-% IJ SOLN
INTRAMUSCULAR | Status: AC
  Filled 2015-12-03: qty 1000

## 2015-12-03 MED ORDER — LIDOCAINE HCL (PF) 1 % IJ SOLN
INTRAMUSCULAR | Status: AC
  Filled 2015-12-03: qty 30

## 2015-12-03 MED ORDER — MIDAZOLAM HCL 2 MG/2ML IJ SOLN
INTRAMUSCULAR | Status: AC
  Filled 2015-12-03: qty 2

## 2015-12-03 MED ORDER — HYDRALAZINE HCL 20 MG/ML IJ SOLN: 5.0000 mg | INTRAMUSCULAR | Status: DC | PRN

## 2015-12-03 MED ORDER — ONDANSETRON HCL 4 MG/2ML IJ SOLN: 4.0000 mg | Freq: Four times a day (QID) | INTRAMUSCULAR | Status: DC | PRN

## 2015-12-03 MED ORDER — HEPARIN SODIUM (PORCINE) 1000 UNIT/ML IJ SOLN
INTRAMUSCULAR | Status: DC | PRN
Start: 2015-12-03 — End: 2015-12-03
  Administered 2015-12-03: 8000 [IU] via INTRAVENOUS

## 2015-12-03 MED ORDER — ALUM & MAG HYDROXIDE-SIMETH 200-200-20 MG/5ML PO SUSP
15.0000 mL | ORAL | Status: DC | PRN
  Filled 2015-12-03: qty 30

## 2015-12-03 MED ORDER — LIDOCAINE HCL (PF) 1 % IJ SOLN
INTRAMUSCULAR | Status: DC | PRN
  Administered 2015-12-03: 12 mL

## 2015-12-03 MED ORDER — SODIUM CHLORIDE 0.9 % IV SOLN: 1.0000 mL/kg/h | INTRAVENOUS | Status: DC

## 2015-12-03 MED ORDER — IODIXANOL 320 MG/ML IV SOLN
INTRAVENOUS | Status: DC | PRN
  Administered 2015-12-03: 50 mL via INTRAVENOUS

## 2015-12-03 MED ORDER — PHENOL 1.4 % MT LIQD: 1.0000 | OROMUCOSAL | Status: DC | PRN

## 2015-12-03 MED ORDER — METOPROLOL TARTRATE 5 MG/5ML IV SOLN: 2.0000 mg | INTRAVENOUS | Status: DC | PRN

## 2015-12-03 MED ORDER — SODIUM CHLORIDE 0.9 % IV SOLN
INTRAVENOUS | Status: DC
  Administered 2015-12-03: 08:00:00 via INTRAVENOUS

## 2015-12-03 MED ORDER — DOCUSATE SODIUM 100 MG PO CAPS
100.0000 mg | ORAL_CAPSULE | Freq: Every day | ORAL | Status: DC
  Filled 2015-12-03: qty 1

## 2015-12-03 MED ORDER — MORPHINE SULFATE (PF) 10 MG/ML IV SOLN: 2.0000 mg | INTRAVENOUS | Status: DC | PRN

## 2015-12-03 MED ORDER — LABETALOL HCL 5 MG/ML IV SOLN
INTRAVENOUS | Status: AC
Start: 2015-12-03 — End: 2015-12-04
  Filled 2015-12-03: qty 4

## 2015-12-03 MED ORDER — OXYCODONE HCL 5 MG PO TABS: 5.0000 mg | ORAL_TABLET | ORAL | Status: DC | PRN

## 2015-12-03 MED ORDER — HEPARIN (PORCINE) IN NACL 2-0.9 UNIT/ML-% IJ SOLN
INTRAMUSCULAR | Status: DC | PRN
  Administered 2015-12-03: 1000 mL

## 2015-12-03 MED ORDER — FENTANYL CITRATE (PF) 100 MCG/2ML IJ SOLN
INTRAMUSCULAR | Status: AC
  Filled 2015-12-03: qty 2

## 2015-12-03 MED ORDER — ACETAMINOPHEN 325 MG PO TABS
325.0000 mg | ORAL_TABLET | ORAL | Status: DC | PRN
  Filled 2015-12-03: qty 2

## 2015-12-03 MED ORDER — LABETALOL HCL 5 MG/ML IV SOLN
10.0000 mg | INTRAVENOUS | Status: DC | PRN
  Administered 2015-12-03: 10 mg via INTRAVENOUS

## 2015-12-03 MED ORDER — FENTANYL CITRATE (PF) 100 MCG/2ML IJ SOLN
INTRAMUSCULAR | Status: DC | PRN
  Administered 2015-12-03: 25 ug via INTRAVENOUS

## 2015-12-03 MED ORDER — ACETAMINOPHEN 325 MG RE SUPP
325.0000 mg | RECTAL | Status: DC | PRN
  Filled 2015-12-03: qty 2

## 2015-12-03 SURGICAL SUPPLY — 16 items
BALLN VIATRAC 6X15X135 (BALLOONS) ×3
BALLOON VIATRAC 6X15X135 (BALLOONS) ×2 IMPLANT
CATH OMNI FLUSH 5F 65CM (CATHETERS) ×3 IMPLANT
DEVICE CONTINUOUS FLUSH (MISCELLANEOUS) ×3 IMPLANT
GUIDE CATH VISTA JR4 6F (CATHETERS) ×3 IMPLANT
KIT ENCORE 26 ADVANTAGE (KITS) ×3 IMPLANT
KIT MICROINTRODUCER STIFF 5F (SHEATH) ×3 IMPLANT
KIT PV (KITS) ×3 IMPLANT
SHEATH PINNACLE 5F 10CM (SHEATH) ×3 IMPLANT
SHEATH PINNACLE 6F 10CM (SHEATH) ×3 IMPLANT
SHIELD RADPAD SCOOP 12X17 (MISCELLANEOUS) ×3 IMPLANT
SYR MEDRAD MARK V 150ML (SYRINGE) ×3 IMPLANT
TRANSDUCER W/STOPCOCK (MISCELLANEOUS) ×3 IMPLANT
TRAY PV CATH (CUSTOM PROCEDURE TRAY) ×3 IMPLANT
WIRE BENTSON .035X145CM (WIRE) ×3 IMPLANT
WIRE STABILIZER XS .014X180CM (WIRE) ×3 IMPLANT

## 2015-12-03 NOTE — Discharge Instructions (Signed)

## 2015-12-03 NOTE — H&P (Signed)
[facility]  Patient name: James Peres CocklereeceMRN: [id number] DOB: May 11, 1947Sex: male  REASON FOR VISIT: follow up  HPI: The patient is back today for followup. He is status post left carotid endarterectomy for asymptomatic stenosis in November of 2013. He is status post right carotid endarterectomy in 2008. He has also undergone a right renal artery stenting. His blood pressure has been trending up. His creatinine to 2 years ago was 1.2. Last year was 1.4. It was back down to 1.2 in November 2016. He reports some difficulty with his blood pressure and suffers from whitecoat hypertension. He is also concerned about gout in his left foot. He is scheduled see his medical doctor Monday  Past Medical History  Diagnosis Date  . Arthritis   . Hyperlipidemia     takes Lipitor daily  . H/O: GI bleed   . Carotid artery occlusion   . H/O renal artery stenosis   . DVT (deep venous thrombosis) ([place]) 2008  . Hypertension     takes Metoprolol and Amlodipine daily  . Joint swelling   . Joint pain   . Gout     takes Allopurinol daily and Indomethacin prn  . GERD (gastroesophageal reflux disease)     takes Protonix daily  . History of gastric ulcer   . History of colon polyps   . History of blood transfusion   . Gout flare   . DVT (deep venous thrombosis) ([facility])     Family History  Problem Relation Age of Onset  . Hypertension Mother   . Hyperlipidemia Mother   . Other Mother     varicose veins  . Hyperlipidemia Father   . Hypertension Father   . Diabetes Father   . Clotting disorder Father   . Deep vein thrombosis Father   . Hypertension Sister   . Diabetes Sister   . Hyperlipidemia Sister   . Varicose Veins Sister   . Other Sister     Bleeding problems  . Hypertension Brother   .  Hyperlipidemia Brother     SOCIAL HISTORY: Social History  Substance Use Topics  . Smoking status: Former Smoker    Quit date: 01/02/2000  . Smokeless tobacco: Former Systems developer    Quit date: 12/26/1999  . Alcohol Use: No     Comment: Quit in 1998    Allergies  Allergen Reactions  . Plavix [Clopidogrel Bisulfate] Other (See Comments)    GI Bleed    Current Outpatient Prescriptions  Medication Sig Dispense Refill  . amLODipine (NORVASC) 10 MG tablet TAKE 1 TABLET DAILY 90 tablet 1  . aspirin 81 MG tablet Take 81 mg by mouth 2 (two) times daily.     Marland Kitchen atorvastatin (LIPITOR) 40 MG tablet TAKE 1 TABLET DAILY 90 tablet 1  . indomethacin (INDOCIN) 50 MG capsule TAKE AS NEEDED 30 capsule 02  . metoprolol (LOPRESSOR) 50 MG tablet TAKE 1 TABLET DAILY 90 tablet 1  . pantoprazole (PROTONIX) 40 MG tablet TAKE 1 TABLET DAILY 90 tablet 1  . cyclobenzaprine (FLEXERIL) 5 MG tablet Take 1 tablet (5 mg total) by mouth 3 (three) times daily as needed for muscle spasms. (Patient not taking: Reported on 10/28/2015) 30 tablet 1  . traMADol (ULTRAM) 50 MG tablet Take 1 tablet (50 mg total) by mouth every 8 (eight) hours as needed. (Patient not taking: Reported on 10/28/2015) 30 tablet 0   No current facility-administered medications for this visit.    REVIEW OF SYSTEMS:  [X]  denotes  positive finding, [ ]  denotes negative finding Cardiac  Comments:  Chest pain or chest pressure:    Shortness of breath upon exertion:    Short of breath when lying flat:    Irregular heart rhythm:        Vascular    Pain in calf, thigh, or hip brought on by ambulation:    Pain in feet at night that wakes you up from your sleep:     Blood clot in your veins:    Leg swelling:  x Gout       Pulmonary    Oxygen at home:    Productive cough:     Wheezing:         Neurologic      Sudden weakness in arms or legs:     Sudden numbness in arms or legs:     Sudden onset of difficulty speaking or slurred speech:    Temporary loss of vision in one eye:     Problems with dizziness:         Gastrointestinal    Blood in stool:     Vomited blood:         Genitourinary    Burning when urinating:     Blood in urine:        Psychiatric    Major depression:         Hematologic    Bleeding problems:    Problems with blood clotting too easily:        Skin    Rashes or ulcers:        Constitutional    Fever or chills:      PHYSICAL EXAM: Filed Vitals:   10/28/15 0931 10/28/15 0933  BP: 200/94 202/94  Pulse: 66   Height: 5\' 10"  (1.778 m)   Weight: 184 lb (83.462 kg)   SpO2: 98%     GENERAL: The patient is a well-nourished male, in no acute distress. The vital signs are documented above. CARDIAC: There is a regular rate and rhythm.  VASCULAR: No carotid bruits. Right sided abdominal bruit PULMONARY: There is good air exchange bilaterally without wheezing or rales. ABDOMEN: Soft and non-tender with normal pitched bowel sounds.  MUSCULOSKELETAL: There are no major deformities or cyanosis. NEUROLOGIC: No focal weakness or paresthesias are detected. SKIN: There are no ulcers or rashes noted. PSYCHIATRIC: The patient has a normal affect.  DATA:  The patient's last carotid Doppler studies were a year ago and showed less than 39% stenosis bilaterally.  Patient had a renal duplex today that shows in-stent stenosis on the right with peak velocity of 4 28 cm/s. The right kidney is 9 cm the left is 11 cm  MEDICAL ISSUES: Carotid stenosis: Patient will have routine carotid Doppler studies in June as previously scheduled  Renal artery stenosis: I discussed the ultrasound findings today with the patient. There is a discrepancy in the size of his right and left kidney  with elevated velocities within the right renal stent. Therefore I have recommended that the patient undergo abdominal aortogram with focus on the right renal stent and intervention as indicated. The patient will contact me with the most appropriate timing.   Annamarie Major [facility] [place] Beeper: [phone]        No interval changes CV:RRR PULM:CTA  Plan angio, possible renal intervention  WElls BRabham

## 2015-12-03 NOTE — Progress Notes (Signed)
59fr sheath aspirated and removed from rfa.Manual pressure applied for 20 minutes. Groin level 0. Bedrest instructions given. Distal pulses 3+ dp bilaterally.  Bedrest begins at 14:15:00   Message left for Dr.Brabham to place post PV orders.

## 2015-12-04 ENCOUNTER — Encounter (HOSPITAL_COMMUNITY): Payer: Self-pay | Admitting: Surgery

## 2015-12-04 DIAGNOSIS — I701 Atherosclerosis of renal artery: Secondary | ICD-10-CM | POA: Diagnosis not present

## 2015-12-04 LAB — POCT ACTIVATED CLOTTING TIME: Activated Clotting Time: 260 seconds

## 2015-12-04 NOTE — Op Note (Signed)
    Patient name: James [name] MRN: [id number] DOB: April 02, 1946 Sex: male  12/03/2015 Pre-operative Diagnosis: Left renal artery stenosis Post-operative diagnosis:  Same Surgeon:  Annamarie Major Procedure Performed:  1.  Ultrasound-guided access, right femoral artery  2.  Abdominal aortogram  3.  First order catheterization (right renal artery)  4.  Renal artery angiogram  5.  Failed angioplasty, right renal artery   Indications:  The patient has a history of right renal artery stenting.  Ultrasound recently suggested a high-grade stenosis.  There is a 2 cm discrepancy in the kidney size.  The right measures 9 cm left measures 11.  He is here today for further evaluation.  Procedure:  The patient was identified in the holding area and taken to room 8.  The patient was then placed supine on the table and prepped and draped in the usual sterile fashion.  A time out was called.  Ultrasound was used to evaluate the right common femoral artery.  It was patent .  A digital ultrasound image was acquired.  A micropuncture needle was used to access the right common femoral artery under ultrasound guidance.  An 018 wire was advanced without resistance and a micropuncture sheath was placed.  The 018 wire was removed and a benson wire was placed.  The micropuncture sheath was exchanged for a 5 french sheath.  An omniflush catheter was advanced over the wire to the level of L-1.  An abdominal angiogram was obtained.    Findings:   Aortogram:  Significant right renal artery stenosis is identified.  No significant left renal artery stenosis.  The infrarenal abdominal aorta is widely patent  Intervention:  After the above images were acquired the decision was made to proceed with intervention.  A 6 French sheath was inserted.  The patient was fully heparinized.  A JR4 catheter was then selected.  It was used to cannulate the right renal artery stent.  I was able to advance a 018 wire out what I thought was  into the distal renal artery.  A 6 x 15 balloon was used to perform balloon angioplasty.  The balloon was reflux out into the aorta.  I then placed the catheter again within the origin of the right renal stent and perform multiple images and different obliquities.  With these maneuvers it appeared that the stent was actually occluded and what we were seeing was filling of the main renal artery through an accessory renal artery.  After these findings I elected to terminate the procedure.  Catheters and wires were removed.  The patient be taken the holding area for sheath pull once his coag profile corrects  Impression:  #1  occluded right renal artery stent.  The right kidney is perfused through a accessory vessel which does opacify the main renal artery.    Theotis Burrow, M.D. [facility] [place] Office: [phone] Pager:  [phone]

## 2015-12-05 ENCOUNTER — Other Ambulatory Visit: Payer: Self-pay

## 2015-12-05 DIAGNOSIS — Z48812 Encounter for surgical aftercare following surgery on the circulatory system: Secondary | ICD-10-CM

## 2015-12-05 DIAGNOSIS — I701 Atherosclerosis of renal artery: Secondary | ICD-10-CM

## 2015-12-05 DIAGNOSIS — I6523 Occlusion and stenosis of bilateral carotid arteries: Secondary | ICD-10-CM

## 2015-12-21 ENCOUNTER — Telehealth: Payer: Self-pay | Admitting: Surgery

## 2015-12-21 NOTE — Telephone Encounter (Signed)
-----   Message from Denman George, RN sent at 12/05/2015 10:52 AM EDT ----- Regarding: needs 6 mo. f/u with NP with Renal Artery Duplex and Carotid Duplex   ----- Message -----    From: Serafina Mitchell, MD    Sent: 12/04/2015  11:04 PM      To: Denman George, RN Subject: RE: need recommendation for Vasc. studies      Should have carotid duplex, as he is s/p CEA ----- Message -----    From: Denman George, RN    Sent: 12/04/2015   9:19 AM      To: Serafina Mitchell, MD Subject: need recommendation for Vasc. studies          Do you want anything other than a renal artery duplex? (because he doesn't currently have anything scheduled)    ----- Message -----    From: Serafina Mitchell, MD    Sent: 12/04/2015   9:10 AM      To: [facility] Charge Pool  12/04/2015:  Surgeon:  Annamarie Major Procedure Performed:  1.  Ultrasound-guided access, right femoral artery  2.  Abdominal aortogram  3.  First order catheterization (right renal artery)  4.  Renal artery angiogram  5.  Failed angioplasty, right renal artery  Please schedule the patient for follow-up in 6 months with the appropriate Ultrasound studies, which should artery be scheduled.  He can see Vinnie Level

## 2015-12-21 NOTE — Telephone Encounter (Signed)
Sched appt 06/22/16; labs at 67 and MD at 10:15. Mailed appt letter.

## 2016-01-01 DIAGNOSIS — H52203 Unspecified astigmatism, bilateral: Secondary | ICD-10-CM | POA: Diagnosis not present

## 2016-01-01 DIAGNOSIS — H2513 Age-related nuclear cataract, bilateral: Secondary | ICD-10-CM | POA: Diagnosis not present

## 2016-01-07 ENCOUNTER — Encounter: Payer: Self-pay | Admitting: Pulmonary Disease

## 2016-01-07 ENCOUNTER — Ambulatory Visit (INDEPENDENT_AMBULATORY_CARE_PROVIDER_SITE_OTHER): Payer: Medicare Other | Admitting: Pulmonary Disease

## 2016-01-07 VITALS — BP 162/72 | HR 78 | Ht 70.0 in | Wt 179.0 lb

## 2016-01-07 DIAGNOSIS — R0600 Dyspnea, unspecified: Secondary | ICD-10-CM | POA: Insufficient documentation

## 2016-01-07 DIAGNOSIS — G4710 Hypersomnia, unspecified: Secondary | ICD-10-CM

## 2016-01-07 DIAGNOSIS — R0609 Other forms of dyspnea: Secondary | ICD-10-CM

## 2016-01-07 NOTE — Patient Instructions (Signed)
It was a pleasure taking care of you today!  We will schedule you to have a sleep study to determine if you have sleep apnea.   We will get a home sleep test.  You will be instructed to come back to the office to get an apparatus to sleep with overnight.  Once we have the apparatus, it will usually take [place] 1-2 weeks to read the study and get back at you with results of the test.  Please give [place] a call in 2 weeks after your study if you do not hear back from [place].    If the sleep study is positive, we will order you a CPAP  machine.  Please call the office if you do NOT receive your machine in the next 1-2 weeks.   Please make sure you use your CPAP device everytime you sleep.  We will monitor the usage of your machine per your insurance requirement.  Your insurance company may take the machine from you if you are not using it regularly.   Please clean the mask, tubings, filter, water reservoir with soapy water every week.  Please use distilled water for the water reservoir.   Please call the office or your machine provider (DME company) if you are having issues with the device.    Return to clinic in 2 months to see Dr. Corrie Dandy or NP.

## 2016-01-07 NOTE — Progress Notes (Signed)
Subjective:    Patient ID: James [name], male    DOB: 1946-05-12, 70 y.o.   MRN: [id number]  HPI   This is the case of James [name], 70 y.o. Male, who was referred by Dr. Vonna Kotyk dettinger  in consultation regarding possible OSA.   As you very well know, patient 60PY smoking history, quit in 2001.  No copd,asthma, sinus issues.   Has snoring, witnessed apneas, gasping, choking.  Has hypersomnia. Hypersomnia affects fxnality.  Has unrefreshed sleep.  (-) abn behavior in sleep.  Sleeps 7-8 hrs/night. Has naps in pm.  ESS 10.       Review of Systems  Constitutional: Negative.  Negative for fever and unexpected weight change.  HENT: Negative.  Negative for congestion, dental problem, ear pain, nosebleeds, postnasal drip, rhinorrhea, sinus pressure, sneezing, sore throat and trouble swallowing.   Eyes: Negative.  Negative for redness and itching.  Respiratory: Negative.  Negative for cough, chest tightness, shortness of breath and wheezing.   Cardiovascular: Negative.  Negative for palpitations.  Gastrointestinal: Negative.  Negative for nausea and vomiting.  Endocrine: Negative.   Genitourinary: Negative.  Negative for dysuria and difficulty urinating.  Musculoskeletal: Negative.  Negative for joint swelling.  Skin: Negative.  Negative for rash.  Allergic/Immunologic: Negative.   Neurological: Negative.  Negative for headaches.  Hematological: Negative.  Does not bruise/bleed easily.  Psychiatric/Behavioral: Negative.  Negative for dysphoric mood. The patient is not nervous/anxious.   All other systems reviewed and are negative.  Past Medical History  Diagnosis Date  . Arthritis   . Hyperlipidemia     takes Lipitor daily  . H/O: GI bleed   . Carotid artery occlusion   . H/O renal artery stenosis   . DVT (deep venous thrombosis) ([place]) 2008  . Hypertension     takes Metoprolol and Amlodipine daily  . Joint swelling   . Joint pain   . Gout     takes  Allopurinol daily and Indomethacin prn  . GERD (gastroesophageal reflux disease)     takes Protonix daily  . History of gastric ulcer   . History of colon polyps   . History of blood transfusion   . Gout flare   . DVT (deep venous thrombosis) (H[facility])    (-) CA.   Family History  Problem Relation Age of Onset  . Hypertension Mother   . Hyperlipidemia Mother   . Other Mother     varicose veins  . Hyperlipidemia Father   . Hypertension Father   . Diabetes Father   . Clotting disorder Father   . Deep vein thrombosis Father   . Hypertension Sister   . Diabetes Sister   . Hyperlipidemia Sister   . Varicose Veins Sister   . Other Sister     Bleeding problems  . Hypertension Brother   . Hyperlipidemia Brother      Past Surgical History  Procedure Laterality Date  . Renal artery stent  2009    Right renal artery stenting-  Right Kidney  . Carotid endarterectomy  2008    right CEA  . Small intestine removed  2001    d/t random gi bleeding  . Esophagogastroduodenoscopy    . Cholecystectomy  mid 90's  . Colonoscopy    . Hernia repair      umbilical hernia  . Endarterectomy  06/02/2012    Procedure: ENDARTERECTOMY CAROTID;  Surgeon: Serafina Mitchell, MD;  Location: [facility];  Service: Vascular;  Laterality: Left;  .  Patch angioplasty  06/02/2012    Procedure: PATCH ANGIOPLASTY;  Surgeon: Serafina Mitchell, MD;  Location: [facility];  Service: Vascular;  Laterality: Left;  using Vascu-Guard Patch  . Colon surgery    . Peripheral vascular catheterization Right 12/03/2015    Procedure: Peripheral Vascular Balloon Angioplasty;  Surgeon: Serafina Mitchell, MD;  Location: [facility];  Service: Cardiovascular;  Laterality: Right;  RENAL unsuccessful    Social History   Social History  . Marital Status: Married    Spouse Name: N/A  . Number of Children: N/A  . Years of Education: N/A   Occupational History  . Not on file.   Social History Main Topics  . Smoking status: Former Smoker      Quit date: 01/02/2000  . Smokeless tobacco: Former Systems developer    Quit date: 12/26/1999  . Alcohol Use: No     Comment: Quit in 1998  . Drug Use: No  . Sexual Activity: Yes   Other Topics Concern  . Not on file   Social History Narrative   Married with 2 boys.  Worked for [employer]. Lives in [place].   Allergies  Allergen Reactions  . Plavix [Clopidogrel Bisulfate] Other (See Comments)    GI Bleed     Outpatient Prescriptions Prior to Visit  Medication Sig Dispense Refill  . acetaminophen (TYLENOL) 500 MG tablet Take 500 mg by mouth 2 (two) times daily as needed (pain).    Marland Kitchen amLODipine (NORVASC) 10 MG tablet TAKE 1 TABLET DAILY (Patient taking differently: TAKE 1 TABLET BY MOUTH DAILY AT SUPPER) 90 tablet 1  . aspirin 81 MG tablet Take 81 mg by mouth 2 (two) times daily with a meal.     . atorvastatin (LIPITOR) 40 MG tablet TAKE 1 TABLET DAILY (Patient taking differently: TAKE 1 TABLET BY MOUTH DAILY AT SUPPER) 90 tablet 1  . metoprolol (LOPRESSOR) 50 MG tablet TAKE 1 TABLET DAILY (Patient taking differently: TAKE 1 TABLET BY MOUTH WITH BREAKFAST) 90 tablet 1  . pantoprazole (PROTONIX) 40 MG tablet TAKE 1 TABLET DAILY (Patient taking differently: TAKE 1 TABLET BY MOUTH DAILY WITH BREAKFAST) 90 tablet 1  . indomethacin (INDOCIN) 50 MG capsule TAKE AS NEEDED (Patient not taking: Reported on 01/07/2016) 30 capsule 02   No facility-administered medications prior to visit.   No orders of the defined types were placed in this encounter.           Objective:   Physical Exam  Vitals:  Filed Vitals:   01/07/16 1609  BP: 162/72  Pulse: 78  Height: 5\' 10"  (1.778 m)  Weight: 179 lb (81.194 kg)  SpO2: 97%    Constitutional/General:  Pleasant, well-nourished, well-developed, not in any distress,  Comfortably seating.  Well kempt  Body mass index is 25.68 kg/(m^2). Wt Readings from Last 3 Encounters:  01/07/16 179 lb (81.194 kg)  12/03/15 193 lb (87.544 kg)   11/04/15 183 lb 6.4 oz (83.19 kg)      HEENT: Pupils equal and reactive to light and accommodation. Anicteric sclerae. Normal nasal mucosa.   No oral  lesions,  mouth clear,  oropharynx clear, no postnasal drip. (-) Oral thrush. No dental caries.  Airway - Mallampati class III  Neck: No masses. Midline trachea. No JVD, (-) LAD. (-) bruits appreciated.  Respiratory/Chest: Grossly normal chest. (-) deformity. (-) Accessory muscle use.  Symmetric expansion. (-) Tenderness on palpation.  Resonant on percussion.  Diminished BS on both lower lung zones. (-) wheezing,  crackles, rhonchi (-) egophony  Cardiovascular: Regular rate and  rhythm, heart sounds normal, no murmur or gallops, no peripheral edema  Gastrointestinal:  Normal bowel sounds. Soft, non-tender. No hepatosplenomegaly.  (-) masses.   Musculoskeletal:  Normal muscle tone. Normal gait.   Extremities: Grossly normal. (-) clubbing, cyanosis.  (-) edema  Skin: (-) rash,lesions seen.   Neurological/Psychiatric : alert, oriented to time, place, person. Normal mood and affect            Assessment & Plan:  Hypersomnia Has snoring, witnessed apneas, gasping, choking.  Has hypersomnia. Hypersomnia affects fxnality.  Has unrefreshed sleep.  (-) abn behavior in sleep.  Sleeps 7-8 hrs/night. Has naps in pm.  ESS 10.  Pt might have issues falling and staying asleep in the lab.   Plan: We discussed about the diagnosis of Obstructive Sleep Apnea (OSA) and implications of untreated OSA. We discussed about CPAP and BiPaP as possible treatment options.   We will schedule the patient for a sleep study. Plan for HST. Pt with issues sleeping in lab. If HST is (-), will need a lab study.    Patient was instructed to call the office if he/she has not heard back from the office 1-2 weeks after the sleep study.   Patient was instructed to call the office if he/she is having issues with the PAP device.   We discussed good  sleep hygiene.   Patient was advised not to engage in activities requiring concentration and/or vigilance if he/she is sleepy.  Patient was advised not to drive if he/she is sleepy.     Exertional dyspnea Significant smoking history but denies copd or asthma. SOB with more than adls.  Will observe.     Thank you very much for letting me participate in this patient's care. Please do not hesitate to give me a call if you have any questions or concerns regarding the treatment plan.   Patient will follow up with me in 2-3 months    J. Shirl Harris, MD 01/07/2016   6:33 PM [facility]monary and [place] Pager: [phone] Office: [phone], Fax: [phone]   J. Shirl Harris, MD 01/07/2016, 6:33 PM [facility] Pager [phone] After 3 pm or if no answer, call [phone]

## 2016-01-07 NOTE — Assessment & Plan Note (Signed)
Significant smoking history but denies copd or asthma. SOB with more than adls.  Will observe.

## 2016-01-07 NOTE — Assessment & Plan Note (Signed)
Has snoring, witnessed apneas, gasping, choking.  Has hypersomnia. Hypersomnia affects fxnality.  Has unrefreshed sleep.  (-) abn behavior in sleep.  Sleeps 7-8 hrs/night. Has naps in pm.  ESS 10.  Pt might have issues falling and staying asleep in the lab.   Plan: We discussed about the diagnosis of Obstructive Sleep Apnea (OSA) and implications of untreated OSA. We discussed about CPAP and BiPaP as possible treatment options.   We will schedule the patient for a sleep study. Plan for HST. Pt with issues sleeping in lab. If HST is (-), will need a lab study.    Patient was instructed to call the office if he/she has not heard back from the office 1-2 weeks after the sleep study.   Patient was instructed to call the office if he/she is having issues with the PAP device.   We discussed good sleep hygiene.   Patient was advised not to engage in activities requiring concentration and/or vigilance if he/she is sleepy.  Patient was advised not to drive if he/she is sleepy.

## 2016-01-14 DIAGNOSIS — G4733 Obstructive sleep apnea (adult) (pediatric): Secondary | ICD-10-CM | POA: Diagnosis not present

## 2016-01-15 ENCOUNTER — Telehealth: Payer: Self-pay | Admitting: Pulmonary Disease

## 2016-01-15 DIAGNOSIS — G4733 Obstructive sleep apnea (adult) (pediatric): Secondary | ICD-10-CM

## 2016-01-15 NOTE — Telephone Encounter (Signed)
  Please call the pt and tell the pt the [place]  showed OSA.   Pt stops breathing  26  times an hour.   Please order autoCPAP 5-15 cm H2O. Patient will need a mask fitting session. Patient will need a 1 month download.   Patient needs to be seen by me or any of the NPs/APPs  4-6 weeks after obtaining the cpap machine. Let me know if you receive this.   Thanks!   J. Shirl Harris, MD 01/15/2016, 4:00 PM

## 2016-01-16 DIAGNOSIS — G4733 Obstructive sleep apnea (adult) (pediatric): Secondary | ICD-10-CM | POA: Diagnosis not present

## 2016-01-20 ENCOUNTER — Other Ambulatory Visit: Payer: Self-pay | Admitting: *Deleted

## 2016-01-20 DIAGNOSIS — G4710 Hypersomnia, unspecified: Secondary | ICD-10-CM

## 2016-01-21 NOTE — Telephone Encounter (Signed)
Spoke with pt and gave results and recommendations. Pt agrees to start CPAP therapy. Order placed. Pt aware to call office and schedule f/u appt once starts CPAP. Nothing further needed.  

## 2016-03-09 ENCOUNTER — Encounter: Payer: Self-pay | Admitting: Pulmonary Disease

## 2016-03-09 ENCOUNTER — Ambulatory Visit (INDEPENDENT_AMBULATORY_CARE_PROVIDER_SITE_OTHER): Payer: Medicare Other | Admitting: Pulmonary Disease

## 2016-03-09 DIAGNOSIS — I701 Atherosclerosis of renal artery: Secondary | ICD-10-CM

## 2016-03-09 DIAGNOSIS — Z9989 Dependence on other enabling machines and devices: Principal | ICD-10-CM

## 2016-03-09 DIAGNOSIS — G4733 Obstructive sleep apnea (adult) (pediatric): Secondary | ICD-10-CM

## 2016-03-09 NOTE — Assessment & Plan Note (Signed)
OSA on CPAP. Plan: You are doing very well on your CPAP. Keep it up!! Continue on CPAP at bedtime. You appear to be benefiting from the treatment Goal is to wear for at least 4-6 hours each night for maximal clinical benefit. Continue to work on weight loss, as the link between excess weight  and sleep apnea is well established.  Do not drive if sleepy. Follow up with Dr. Corrie Dandy 3 months  or before as needed.  Please contact office for sooner follow up if symptoms do not improve or worsen or seek emergency care

## 2016-03-09 NOTE — Progress Notes (Signed)
History of Present Illness James [name] is a 70 y.o. male with OSA followed bu Dr. Corrie Dandy   8/14/2017Follow up for CPAP use: Pt. States he is using his CPAP every night. He has lost weight and has noticed his blood pressure has been somewhat lower. He does have an issue with the face mask.He is going by [facility] after this appointment to have the mask adjusted or get a bigger mask. He denies chest pain, fever, orthopnea or hemoptysis.   Tests Sleep Study indicates AHI = 26 CPAP initiated July 7th.  DownLoad: 02/08/2016-03/08/2016:  Auto CPAP 5-15 cm H2O 30 of 30 days >>100% Compliance Average Usage 7 hours. AHI>> 1.5   Past medical hx Past Medical History:  Diagnosis Date  . Arthritis   . Carotid artery occlusion   . DVT (deep venous thrombosis) ([facility]) 2008  . DVT (deep venous thrombosis) ([facility])   . GERD (gastroesophageal reflux disease)    takes Protonix daily  . Gout    takes Allopurinol daily and Indomethacin prn  . Gout flare   . H/O renal artery stenosis   . H/O: GI bleed   . History of blood transfusion   . History of colon polyps   . History of gastric ulcer   . Hyperlipidemia    takes Lipitor daily  . Hypertension    takes Metoprolol and Amlodipine daily  . Joint pain   . Joint swelling      Past surgical hx, Family hx, Social hx all reviewed.  Current Outpatient Prescriptions on File Prior to Visit  Medication Sig  . acetaminophen (TYLENOL) 500 MG tablet Take 500 mg by mouth 2 (two) times daily as needed (pain).  Marland Kitchen amLODipine (NORVASC) 10 MG tablet TAKE 1 TABLET DAILY (Patient taking differently: TAKE 1 TABLET BY MOUTH DAILY AT SUPPER)  . aspirin 81 MG tablet Take 81 mg by mouth 2 (two) times daily with a meal.   . atorvastatin (LIPITOR) 40 MG tablet TAKE 1 TABLET DAILY (Patient taking differently: TAKE 1 TABLET BY MOUTH DAILY AT SUPPER)  . indomethacin (INDOCIN) 50 MG capsule TAKE AS NEEDED  . metoprolol (LOPRESSOR) 50 MG tablet TAKE 1  TABLET DAILY (Patient taking differently: TAKE 1 TABLET BY MOUTH WITH BREAKFAST)  . pantoprazole (PROTONIX) 40 MG tablet TAKE 1 TABLET DAILY (Patient taking differently: TAKE 1 TABLET BY MOUTH DAILY WITH BREAKFAST)   No current facility-administered medications on file prior to visit.      Allergies  Allergen Reactions  . Plavix [Clopidogrel Bisulfate] Other (See Comments)    GI Bleed    Review Of Systems:  Constitutional:   No  weight loss, night sweats,  Fevers, chills, fatigue, or  lassitude.  HEENT:   No headaches,  Difficulty swallowing,  Tooth/dental problems, or  Sore throat,                No sneezing, itching, ear ache, nasal congestion, post nasal drip,   CV:  No chest pain,  Orthopnea, PND, swelling in lower extremities, anasarca, dizziness, palpitations, syncope.   GI  No heartburn, indigestion, abdominal pain, nausea, vomiting, diarrhea, change in bowel habits, loss of appetite, bloody stools.   Resp: No shortness of breath with exertion or at rest.  No excess mucus, no productive cough,  No non-productive cough,  No coughing up of blood.  No change in color of mucus.  No wheezing.  No chest wall deformity  Skin: no rash or lesions.  GU: no  dysuria, change in color of urine, no urgency or frequency.  No flank pain, no hematuria   MS:  No joint pain or swelling.  No decreased range of motion.  No back pain.  Psych:  No change in mood or affect. No depression or anxiety.  No memory loss.   Vital Signs BP (!) 148/78 (BP Location: Left Arm, Cuff Size: Normal)   Pulse 64   Ht 5\' 10"  (1.778 m)   Wt 181 lb (82.1 kg)   SpO2 97%   BMI 25.97 kg/m    Physical Exam:  General- No distress,  A&Ox3, pleasant ENT: No sinus tenderness, TM clear, pale nasal mucosa, no oral exudate,no post nasal drip, no LAN Cardiac: S1, S2, regular rate and rhythm, no murmur Chest: No wheeze/ rales/ dullness; no accessory muscle use, no nasal flaring, no sternal retractions Abd.: Soft  Non-tender Ext: No clubbing cyanosis, edema Neuro:  normal strength Skin: No rashes, warm and dry Psych: normal mood and behavior   Assessment/Plan  OSA on CPAP OSA on CPAP. Plan: You are doing very well on your CPAP. Keep it up!! Continue on CPAP at bedtime. You appear to be benefiting from the treatment Goal is to wear for at least 4-6 hours each night for maximal clinical benefit. Continue to work on weight loss, as the link between excess weight  and sleep apnea is well established.  Do not drive if sleepy. Follow up with Dr. Corrie Dandy 3 months  or before as needed.  Please contact office for sooner follow up if symptoms do not improve or worsen or seek emergency care      Magdalen Spatz, NP 03/09/2016  3:48 PM         ATTENDING NOTE / ATTESTATION NOTE :   I have discussed the case with the resident/APP  Eric Form.    I agree with the resident/APP's  history, assessment, and plans.    I have edited the above note and modified it according to our agreed history,  assessment and plan. I was NOT able to see this patient today.    Briefly, patient was seen for hypersomnia. Was diagnosed with obstructive sleep apnea. Home sleep study with AHI of 26. Download for one month 100% compliance, AHI of 1.5. Patient uses CPAP. Feels better using it. More energy. Less sleepiness. Feels benefit of CPAP. Continue CPAP use.   Monica Becton, MD 03/09/2016, 5:37 PM [facility] Pager [phone] After 3 pm or if no answer, call [phone]

## 2016-03-09 NOTE — Patient Instructions (Addendum)
It is nice to meet you today You are doing very well on your CPAP. Keep it up!! Continue on CPAP at bedtime. You appear to be benefiting from the treatment Goal is to wear for at least 4-6 hours each night for maximal clinical benefit. Continue to work on weight loss, as the link between excess weight  and sleep apnea is well established.  Do not drive if sleepy. Follow up with 3 months  or before as needed.  Please contact office for sooner follow up if symptoms do not improve or worsen or seek emergency care

## 2016-03-11 ENCOUNTER — Ambulatory Visit (INDEPENDENT_AMBULATORY_CARE_PROVIDER_SITE_OTHER): Payer: Medicare Other | Admitting: Family Medicine

## 2016-03-11 ENCOUNTER — Encounter: Payer: Self-pay | Admitting: Family Medicine

## 2016-03-11 VITALS — BP 138/79 | HR 84 | Temp 97.0°F | Ht 70.0 in | Wt 180.0 lb

## 2016-03-11 DIAGNOSIS — I701 Atherosclerosis of renal artery: Secondary | ICD-10-CM

## 2016-03-11 DIAGNOSIS — Z1159 Encounter for screening for other viral diseases: Secondary | ICD-10-CM

## 2016-03-11 DIAGNOSIS — I10 Essential (primary) hypertension: Secondary | ICD-10-CM | POA: Diagnosis not present

## 2016-03-11 DIAGNOSIS — N189 Chronic kidney disease, unspecified: Secondary | ICD-10-CM | POA: Diagnosis not present

## 2016-03-11 DIAGNOSIS — N179 Acute kidney failure, unspecified: Secondary | ICD-10-CM

## 2016-03-11 DIAGNOSIS — E785 Hyperlipidemia, unspecified: Secondary | ICD-10-CM

## 2016-03-11 MED ORDER — METOPROLOL TARTRATE 50 MG PO TABS
ORAL_TABLET | ORAL | 1 refills | Status: DC
Start: 2016-03-11 — End: 2016-09-03

## 2016-03-11 MED ORDER — ATORVASTATIN CALCIUM 40 MG PO TABS: ORAL_TABLET | ORAL | 1 refills | Status: DC

## 2016-03-11 MED ORDER — AMLODIPINE BESYLATE 10 MG PO TABS: ORAL_TABLET | ORAL | 1 refills | Status: DC

## 2016-03-11 MED ORDER — PANTOPRAZOLE SODIUM 40 MG PO TBEC: 40.0000 mg | DELAYED_RELEASE_TABLET | Freq: Every day | ORAL | 1 refills | Status: DC

## 2016-03-11 NOTE — Progress Notes (Signed)
BP 138/79 (BP Location: Left Arm, Patient Position: Sitting, Cuff Size: Large)   Pulse 84   Temp 97 F (36.1 C) (Oral)   Ht 5' 10"  (1.778 m)   Wt 180 lb (81.6 kg)   BMI 25.83 kg/m    Subjective:    Patient ID: James [name], male    DOB: [birth year], 70 y.o.   MRN: [id number]  HPI: James [name] is a 70 y.o. male presenting on 03/11/2016 for Hypertension (3 month followup) and Hyperlipidemia   HPI Hypertension recheck Patient is coming in for a blood pressure recheck today. Since our last visit he had a sleep study which diagnosed him as sleep apnea and he has been using the CPAP daily and his blood pressure has greatly improved. His blood pressure today is 138/79. He is currently on amlodipine 10 mg. Patient denies headaches, blurred vision, chest pains, shortness of breath, or weakness. Denies any side effects from medication and is content with current medication.   Hyperlipidemia Patient is coming in for cholesterol recheck today. He is not due for lab values but needs a refill on his medication. He is currently on Lipitor 40 mg and denies any myalgias or issues with medication.  Acute on chronic kidney failure Patient has a baseline creatinine of 1.3 and on his last check a couple months ago with his vascular surgeon his renal function and bumped up to 1.4. He denies any issues with urinating. We will have this rechecked today with lab values.  Relevant past medical, surgical, family and social history reviewed and updated as indicated. Interim medical history since our last visit reviewed. Allergies and medications reviewed and updated.  Review of Systems  Constitutional: Negative for chills and fever.  HENT: Negative for congestion, ear discharge and ear pain.   Eyes: Negative for discharge and visual disturbance.  Respiratory: Negative for shortness of breath and wheezing.   Cardiovascular: Negative for chest pain and leg swelling.  Gastrointestinal: Negative  for abdominal pain, constipation and diarrhea.  Genitourinary: Negative for difficulty urinating.  Musculoskeletal: Negative for back pain and gait problem.  Skin: Negative for rash.  Neurological: Negative for dizziness, syncope, light-headedness and headaches.  All other systems reviewed and are negative.   Per HPI unless specifically indicated above     Medication List       Accurate as of 03/11/16 11:25 AM. Always use your most recent med list.          acetaminophen 500 MG tablet Commonly known as:  TYLENOL Take 500 mg by mouth 2 (two) times daily as needed (pain).   amLODipine 10 MG tablet Commonly known as:  NORVASC TAKE 1 TABLET BY MOUTH DAILY AT SUPPER   aspirin 81 MG tablet Take 81 mg by mouth 2 (two) times daily with a meal.   atorvastatin 40 MG tablet Commonly known as:  LIPITOR TAKE 1 TABLET BY MOUTH DAILY AT SUPPER   indomethacin 50 MG capsule Commonly known as:  INDOCIN TAKE AS NEEDED   metoprolol 50 MG tablet Commonly known as:  LOPRESSOR TAKE 1 TABLET BY MOUTH WITH BREAKFAST   pantoprazole 40 MG tablet Commonly known as:  PROTONIX Take 1 tablet (40 mg total) by mouth daily with breakfast.          Objective:    BP 138/79 (BP Location: Left Arm, Patient Position: Sitting, Cuff Size: Large)   Pulse 84   Temp 97 F (36.1 C) (Oral)   Ht 5' 10"  (1.778 m)  Wt 180 lb (81.6 kg)   BMI 25.83 kg/m   Wt Readings from Last 3 Encounters:  03/11/16 180 lb (81.6 kg)  03/09/16 181 lb (82.1 kg)  01/07/16 179 lb (81.2 kg)    Physical Exam  Constitutional: He is oriented to person, place, and time. He appears well-developed and well-nourished. No distress.  Eyes: Conjunctivae and EOM are normal. Pupils are equal, round, and reactive to light. Right eye exhibits no discharge. No scleral icterus.  Neck: Neck supple. No thyromegaly present.  Cardiovascular: Normal rate, regular rhythm, normal heart sounds and intact distal pulses.   No murmur  heard. Pulmonary/Chest: Effort normal and breath sounds normal. No respiratory distress. He has no wheezes.  Abdominal: Soft. Bowel sounds are normal. He exhibits no distension. There is no tenderness. There is no rebound and no guarding.  Musculoskeletal: Normal range of motion. He exhibits no edema.  Lymphadenopathy:    He has no cervical adenopathy.  Neurological: He is alert and oriented to person, place, and time. Coordination normal.  Skin: Skin is warm and dry. No rash noted. He is not diaphoretic.  Psychiatric: He has a normal mood and affect. His behavior is normal.  Nursing note and vitals reviewed.   Results for orders placed or performed during the hospital encounter of 12/03/15  I-STAT, chem 8  Result Value Ref Range   Sodium 140 135 - 145 mmol/L   Potassium 3.1 (L) 3.5 - 5.1 mmol/L   Chloride 99 (L) 101 - 111 mmol/L   BUN 13 6 - 20 mg/dL   Creatinine, Ser 1.40 (H) 0.61 - 1.24 mg/dL   Glucose, Bld 104 (H) 65 - 99 mg/dL   Calcium, Ion 1.06 (L) 1.13 - 1.30 mmol/L   TCO2 28 0 - 100 mmol/L   Hemoglobin 13.9 13.0 - 17.0 g/dL   HCT 41.0 39.0 - 52.0 %  POCT Activated clotting time  Result Value Ref Range   Activated Clotting Time 223 seconds  POCT Activated clotting time  Result Value Ref Range   Activated Clotting Time 193 seconds  POCT Activated clotting time  Result Value Ref Range   Activated Clotting Time 178 seconds  POCT Activated clotting time  Result Value Ref Range   Activated Clotting Time 260 seconds      Assessment & Plan:   Problem List Items Addressed This Visit      Cardiovascular and Mediastinum   Essential hypertension, benign - Primary   Relevant Medications   amLODipine (NORVASC) 10 MG tablet   atorvastatin (LIPITOR) 40 MG tablet   metoprolol (LOPRESSOR) 50 MG tablet   Other Relevant Orders   CMP14+EGFR     Other   Hyperlipidemia LDL goal <100   Relevant Medications   amLODipine (NORVASC) 10 MG tablet   atorvastatin (LIPITOR) 40 MG  tablet   metoprolol (LOPRESSOR) 50 MG tablet    Other Visit Diagnoses    Acute on chronic kidney failure (HCC)       Relevant Orders   CMP14+EGFR   Need for hepatitis C screening test       Relevant Orders   Hepatitis C antibody       Follow up plan: Return in about 3 months (around 06/11/2016), or if symptoms worsen or fail to improve, for Hypertension and hyperlipidemia recheck.  Counseling provided for all of the vaccine components Orders Placed This Encounter  Procedures  . CMP14+EGFR  . Hepatitis C antibody    Caryl Pina, MD [facility] 03/11/2016,  11:25 AM

## 2016-03-12 LAB — CMP14+EGFR
ALT: 12 IU/L (ref 0–44)
AST: 20 IU/L (ref 0–40)
Albumin/Globulin Ratio: 1.8 (ref 1.2–2.2)
Albumin: 4.5 g/dL (ref 3.5–4.8)
Alkaline Phosphatase: 175 IU/L — ABNORMAL HIGH (ref 39–117)
BUN/Creatinine Ratio: 12 (ref 10–24)
BUN: 16 mg/dL (ref 8–27)
Bilirubin Total: 0.9 mg/dL (ref 0.0–1.2)
CO2: 26 mmol/L (ref 18–29)
Calcium: 9.2 mg/dL (ref 8.6–10.2)
Chloride: 96 mmol/L (ref 96–106)
Creatinine, Ser: 1.39 mg/dL — ABNORMAL HIGH (ref 0.76–1.27)
GFR calc Af Amer: 59 mL/min/{1.73_m2} — ABNORMAL LOW (ref >59)
GFR calc non Af Amer: 51 mL/min/{1.73_m2} — ABNORMAL LOW (ref >59)
Globulin, Total: 2.5 g/dL (ref 1.5–4.5)
Glucose: 100 mg/dL — ABNORMAL HIGH (ref 65–99)
Potassium: 3.5 mmol/L (ref 3.5–5.2)
Sodium: 140 mmol/L (ref 134–144)
Total Protein: 7 g/dL (ref 6.0–8.5)

## 2016-03-12 LAB — HEPATITIS C ANTIBODY: Hep C Virus Ab: <0.1 s/co ratio (ref 0.0–0.9)

## 2016-03-25 ENCOUNTER — Ambulatory Visit: Payer: Medicare Other | Admitting: Pulmonary Disease

## 2016-06-10 ENCOUNTER — Encounter: Payer: Self-pay | Admitting: Family

## 2016-06-11 ENCOUNTER — Encounter: Payer: Self-pay | Admitting: Family Medicine

## 2016-06-11 ENCOUNTER — Ambulatory Visit (INDEPENDENT_AMBULATORY_CARE_PROVIDER_SITE_OTHER): Payer: Medicare Other | Admitting: Family Medicine

## 2016-06-11 VITALS — BP 138/86 | HR 92 | Temp 97.0°F | Ht 70.0 in | Wt 183.2 lb

## 2016-06-11 DIAGNOSIS — R7301 Impaired fasting glucose: Secondary | ICD-10-CM

## 2016-06-11 DIAGNOSIS — E785 Hyperlipidemia, unspecified: Secondary | ICD-10-CM

## 2016-06-11 DIAGNOSIS — I10 Essential (primary) hypertension: Secondary | ICD-10-CM | POA: Diagnosis not present

## 2016-06-11 DIAGNOSIS — M109 Gout, unspecified: Secondary | ICD-10-CM

## 2016-06-11 DIAGNOSIS — I701 Atherosclerosis of renal artery: Secondary | ICD-10-CM | POA: Diagnosis not present

## 2016-06-11 LAB — BAYER DCA HB A1C WAIVED: HB A1C (BAYER DCA - WAIVED): 5.7 % (ref <7.0)

## 2016-06-11 MED ORDER — FEBUXOSTAT 40 MG PO TABS: 40.0000 mg | ORAL_TABLET | Freq: Every day | ORAL | 0 refills | Status: DC

## 2016-06-11 NOTE — Progress Notes (Signed)
BP 138/86   Pulse 92   Temp 97 F (36.1 C) (Oral)   Ht 5' 10"  (1.778 m)   Wt 183 lb 4 oz (83.1 kg)   BMI 26.29 kg/m    Subjective:    Patient ID: James [name], male    DOB: 1945/10/26, 70 y.o.   MRN: [id number]  HPI: James [name] is a 70 y.o. male presenting on 06/11/2016 for Hyperlipidemia (3 month followup); Hypertension; and Gout (discuss medication, stopped Allopurinol because he felt it was making his gout worse, is not taking Indomethacin because it made him dizzy and lightheaded)   HPI Hypertension recheck Patient is coming in for hypertension recheck today. His blood pressure is 138/86. He is currently on amlodipine and metoprolol. Patient denies headaches, blurred vision, chest pains, shortness of breath, or weakness. Denies any side effects from medication and is content with current medication.   Hyperlipidemia recheck Patient is coming in for cholesterol recheck today. He is currently on Lipitor 40 mg. He denies any muscle aches or pains or any known liver issues. He is coming in fasting today to get his cholesterol checked again.  Gout, recurrent Patient has recurrent gout that has been troubling him, more in his left great toe and ankle than anywhere else. He does not have it currently but up to this point he had been taking allopurinol and Indocin and does not feel like they're helping as much and would like to try something different. He says he gets flareups about 5-6 times per year. Right now he does not have any pain. He also was told previously that he had a very elevated uric acid level.  Elevated fasting glucose Patient had an elevated fasting glucose on his last time and we will check to see her whether or not he has diabetes.  Relevant past medical, surgical, family and social history reviewed and updated as indicated. Interim medical history since our last visit reviewed. Allergies and medications reviewed and updated.  Review of Systems    Constitutional: Negative for chills and fever.  Eyes: Negative for discharge.  Respiratory: Negative for shortness of breath and wheezing.   Cardiovascular: Negative for chest pain and leg swelling.  Musculoskeletal: Negative for arthralgias, back pain, gait problem and joint swelling.  Skin: Negative for rash.  Neurological: Negative for dizziness, weakness, light-headedness, numbness and headaches.  All other systems reviewed and are negative.   Per HPI unless specifically indicated above     Objective:    BP 138/86   Pulse 92   Temp 97 F (36.1 C) (Oral)   Ht 5' 10"  (1.778 m)   Wt 183 lb 4 oz (83.1 kg)   BMI 26.29 kg/m   Wt Readings from Last 3 Encounters:  06/11/16 183 lb 4 oz (83.1 kg)  03/11/16 180 lb (81.6 kg)  03/09/16 181 lb (82.1 kg)    Physical Exam  Constitutional: He is oriented to person, place, and time. He appears well-developed and well-nourished. No distress.  Eyes: Conjunctivae are normal. Right eye exhibits no discharge. Left eye exhibits no discharge. No scleral icterus.  Cardiovascular: Normal rate, regular rhythm, normal heart sounds and intact distal pulses.   No murmur heard. Pulmonary/Chest: Effort normal and breath sounds normal. No respiratory distress. He has no wheezes. He has no rales.  Musculoskeletal: Normal range of motion. He exhibits no edema, tenderness or deformity.  Neurological: He is alert and oriented to person, place, and time. Coordination normal.  Skin: Skin is warm  and dry. No rash noted. He is not diaphoretic.  Psychiatric: He has a normal mood and affect. His behavior is normal.  Nursing note and vitals reviewed.     Assessment & Plan:   Problem List Items Addressed This Visit      Cardiovascular and Mediastinum   Essential hypertension, benign - Primary   Relevant Orders   CMP14+EGFR (Completed)   Microalbumin / creatinine urine ratio (Completed)     Other   Hyperlipidemia LDL goal <100   Relevant Orders    Microalbumin / creatinine urine ratio (Completed)   Gout   Relevant Medications   febuxostat (ULORIC) 40 MG tablet   Other Relevant Orders   Microalbumin / creatinine urine ratio (Completed)    Other Visit Diagnoses    Elevated fasting glucose       Relevant Orders   Bayer DCA Hb A1c Waived (Completed)   Microalbumin / creatinine urine ratio (Completed)       Follow up plan: Return in about 3 months (around 09/11/2016), or if symptoms worsen or fail to improve, for Hypertension recheck.  Counseling provided for all of the vaccine components Orders Placed This Encounter  Procedures  . Bayer DCA Hb A1c Waived  . [place], MD [facility] 06/11/2016, 11:49 AM

## 2016-06-12 ENCOUNTER — Ambulatory Visit: Payer: Medicare Other | Admitting: Pulmonary Disease

## 2016-06-12 LAB — CMP14+EGFR
ALT: 13 IU/L (ref 0–44)
AST: 18 IU/L (ref 0–40)
Albumin/Globulin Ratio: 1.6 (ref 1.2–2.2)
Albumin: 4.4 g/dL (ref 3.5–4.8)
Alkaline Phosphatase: 207 IU/L — ABNORMAL HIGH (ref 39–117)
BUN/Creatinine Ratio: 10 (ref 10–24)
BUN: 14 mg/dL (ref 8–27)
Bilirubin Total: 0.9 mg/dL (ref 0.0–1.2)
CO2: 26 mmol/L (ref 18–29)
Calcium: 9.6 mg/dL (ref 8.6–10.2)
Chloride: 96 mmol/L (ref 96–106)
Creatinine, Ser: 1.37 mg/dL — ABNORMAL HIGH (ref 0.76–1.27)
GFR calc Af Amer: 60 mL/min/{1.73_m2} (ref >59)
GFR calc non Af Amer: 52 mL/min/{1.73_m2} — ABNORMAL LOW (ref >59)
Globulin, Total: 2.7 g/dL (ref 1.5–4.5)
Glucose: 109 mg/dL — ABNORMAL HIGH (ref 65–99)
Potassium: 3.6 mmol/L (ref 3.5–5.2)
Sodium: 141 mmol/L (ref 134–144)
Total Protein: 7.1 g/dL (ref 6.0–8.5)

## 2016-06-12 LAB — MICROALBUMIN / CREATININE URINE RATIO
Creatinine, Urine: 83.8 mg/dL
Microalb/Creat Ratio: 2373.6 mg/g creat — ABNORMAL HIGH (ref 0.0–30.0)
Microalbumin, Urine: 1989.1 ug/mL

## 2016-06-22 ENCOUNTER — Ambulatory Visit (INDEPENDENT_AMBULATORY_CARE_PROVIDER_SITE_OTHER): Payer: Medicare Other | Admitting: Family

## 2016-06-22 ENCOUNTER — Encounter: Payer: Self-pay | Admitting: Family

## 2016-06-22 ENCOUNTER — Ambulatory Visit (HOSPITAL_COMMUNITY)
Admission: RE | Admit: 2016-06-22 | Discharge: 2016-06-22 | Disposition: A | Discharge status: Home / Self Care | Payer: Medicare Other | Source: Ambulatory Visit | Attending: Family | Admitting: Family

## 2016-06-22 ENCOUNTER — Encounter: Payer: Self-pay | Admitting: Pulmonary Disease

## 2016-06-22 VITALS — BP 209/94 | HR 84 | Temp 97.0°F | Ht 70.0 in | Wt 183.0 lb

## 2016-06-22 DIAGNOSIS — Z959 Presence of cardiac and vascular implant and graft, unspecified: Secondary | ICD-10-CM | POA: Diagnosis not present

## 2016-06-22 DIAGNOSIS — Z87891 Personal history of nicotine dependence: Secondary | ICD-10-CM | POA: Diagnosis not present

## 2016-06-22 DIAGNOSIS — Z48812 Encounter for surgical aftercare following surgery on the circulatory system: Secondary | ICD-10-CM | POA: Insufficient documentation

## 2016-06-22 DIAGNOSIS — Z9889 Other specified postprocedural states: Secondary | ICD-10-CM | POA: Diagnosis not present

## 2016-06-22 DIAGNOSIS — I701 Atherosclerosis of renal artery: Secondary | ICD-10-CM

## 2016-06-22 DIAGNOSIS — I6523 Occlusion and stenosis of bilateral carotid arteries: Secondary | ICD-10-CM

## 2016-06-22 DIAGNOSIS — R140 Abdominal distension (gaseous): Secondary | ICD-10-CM | POA: Diagnosis not present

## 2016-06-22 LAB — VAS US CAROTID
LEFT ECA DIAS: -6 cm/s
LEFT VERTEBRAL DIAS: -14 cm/s
Left CCA dist dias: -17 cm/s
Left CCA dist sys: -108 cm/s
Left CCA prox sys: 108 cm/s
Left ICA dist dias: -28 cm/s
Left ICA dist sys: -91 cm/s
Left ICA prox dias: -15 cm/s
Left ICA prox sys: -74 cm/s
RIGHT CCA MID DIAS: 14 cm/s
RIGHT ECA DIAS: -3 cm/s
RIGHT VERTEBRAL DIAS: -8 cm/s
Right CCA prox dias: 14 cm/s
Right CCA prox sys: 107 cm/s
Right cca dist sys: -82 cm/s

## 2016-06-22 NOTE — Patient Instructions (Addendum)
Renal Artery Stenosis Introduction Renal artery stenosis (RAS) is narrowing of the artery that carries blood to your kidneys. It can affect one or both kidneys. Your kidneys filter waste and extra fluid from your blood. You get rid of the waste and fluid when you urinate. Your kidneys also make an important chemical messenger (hormone) called renin. Renin helps regulate your blood pressure. The first sign of RAS may be high blood pressure. Over time, other symptoms can develop. What are the causes? Plaque buildup in your arteries (atherosclerosis) is the main cause of RAS. The plaques that cause this are made up of:  Fat.  Cholesterol.  Calcium.  Other substances. As these substances build up in your renal artery, this slows the blood supply to your kidneys. The lack of blood and oxygen causes the signs and symptoms of RAS. A much less common cause of RAS is a disease called fibromuscular dysplasia. This disease causes abnormal cell growth that narrows the renal artery. It is not related to atherosclerosis. It occurs mostly in women who are 42-80 years old. It may be passed down through families. What increases the risk? You may be at risk for renal artery stenosis if you:  Are a man who is at least 70 years old.  Are a woman who is at least 70 years old.  Have high blood pressure.  Have high cholesterol.  Are a smoker.  Abuse alcohol.  Have diabetes or prediabetes.  Are overweight.  Have a family history of early heart disease. What are the signs or symptoms? RAS usually develops slowly. You may not have any signs or symptoms at first. The earliest signs may be:  Developing high blood pressure.  A sudden increase in existing high blood pressure.  No longer responding to medicine that used to control your blood pressure. Later signs and symptoms are due to kidney damage. They may include:  Fatigue.  Shortness of breath.  Swollen legs and feet.  Dry  skin.  Headaches.  Muscle cramps.  Loss of appetite.  Nausea or vomiting. How is this diagnosed? Your health care provider may suspect RAS based on changes in your blood pressure and your risk factors. A physical exam will be done. Your health care provider may use a stethoscope to listen for a whooshing sound (bruit) that can occur where the renal artery is blocking blood flow. Several tests may be done to confirm a diagnosis of RAS. These may include:  Blood and urine tests to check your kidney function.  Imaging tests of your kidneys, such as:  A test that involves using sound waves to create an image of your kidneys and the blood flow to your kidneys (ultrasound).  A test in which dye is injected into one of your blood vessels so images can be taken as the dye flows through your renal arteries (angiogram). These tests can be done using X-rays, a CT scan (computed tomography angiogram, CTA), or a type of MRI (magnetic resonance angiogram, MRA). How is this treated? Making lifestyle changes to reduce your risk factors is the first treatment option for early RAS. If the blood flow to one of your kidneys is cut by more than half, you may need medicine to:  Lower your blood pressure. This is the main medical treatment for RAS. You may need more than one type of medicine for this. The two types that work best for RAS are:  ACE inhibitors.  Angiotensin receptor blockers.  Reduce fluid in the body (diuretics).  Lower your cholesterol (statins). If medicine is not enough to control RAS, you may need surgery. This may involve:  Threading a tube with an inflatable balloon into the renal artery to force it open (angioplasty).  Removing plaque from inside the artery (endarterectomy). Follow these instructions at home:  Take medicines only as directed by your health care provider.  Make any lifestyle changes recommended by your health care provider. This may include:  Working with a  dietitian to maintain a heart-healthy diet. This type of diet is low in saturated fat, salt, and added sugar.  Starting an exercise program as directed by your health care provider.  Maintaining a healthy weight.  Quitting smoking.  Not abusing alcohol.  Keep all follow-up visits as directed by your health care provider. This is important. Contact a health care provider if:  Your symptoms of RAS are not getting better.  Your symptoms are changing or getting worse. Get help right away if:  You have very bad pain in your back or abdomen.  You have blood in your urine. This information is not intended to replace advice given to you by your health care provider. Make sure you discuss any questions you have with your health care provider. Document Released: 04/08/2005 Document Revised: 12/19/2015 Document Reviewed: 10/26/2013  2017 Elsevier    Stroke Prevention Some medical conditions and behaviors are associated with an increased chance of having a stroke. You may prevent a stroke by making healthy choices and managing medical conditions. How can I reduce my risk of having a stroke?  Stay physically active. Get at least 30 minutes of activity on most or all days.  Do not smoke. It may also be helpful to avoid exposure to secondhand smoke.  Limit alcohol use. Moderate alcohol use is considered to be:  No more than 2 drinks per day for men.  No more than 1 drink per day for nonpregnant women.  Eat healthy foods. This involves:  Eating 5 or more servings of fruits and vegetables a day.  Making dietary changes that address high blood pressure (hypertension), high cholesterol, diabetes, or obesity.  Manage your cholesterol levels.  Making food choices that are high in fiber and low in saturated fat, trans fat, and cholesterol may control cholesterol levels.  Take any prescribed medicines to control cholesterol as directed by your health care provider.  Manage your  diabetes.  Controlling your carbohydrate and sugar intake is recommended to manage diabetes.  Take any prescribed medicines to control diabetes as directed by your health care provider.  Control your hypertension.  Making food choices that are low in salt (sodium), saturated fat, trans fat, and cholesterol is recommended to manage hypertension.  Ask your health care provider if you need treatment to lower your blood pressure. Take any prescribed medicines to control hypertension as directed by your health care provider.  If you are 74-40 years of age, have your blood pressure checked every 3-5 years. If you are 35 years of age or older, have your blood pressure checked every year.  Maintain a healthy weight.  Reducing calorie intake and making food choices that are low in sodium, saturated fat, trans fat, and cholesterol are recommended to manage weight.  Stop drug abuse.  Avoid taking birth control pills.  Talk to your health care provider about the risks of taking birth control pills if you are over 44 years old, smoke, get migraines, or have ever had a blood clot.  Get evaluated for sleep disorders (  sleep apnea).  Talk to your health care provider about getting a sleep evaluation if you snore a lot or have excessive sleepiness.  Take medicines only as directed by your health care provider.  For some people, aspirin or blood thinners (anticoagulants) are helpful in reducing the risk of forming abnormal blood clots that can lead to stroke. If you have the irregular heart rhythm of atrial fibrillation, you should be on a blood thinner unless there is a good reason you cannot take them.  Understand all your medicine instructions.  Make sure that other conditions (such as anemia or atherosclerosis) are addressed. Get help right away if:  You have sudden weakness or numbness of the face, arm, or leg, especially on one side of the body.  Your face or eyelid droops to one  side.  You have sudden confusion.  You have trouble speaking (aphasia) or understanding.  You have sudden trouble seeing in one or both eyes.  You have sudden trouble walking.  You have dizziness.  You have a loss of balance or coordination.  You have a sudden, severe headache with no known cause.  You have new chest pain or an irregular heartbeat. Any of these symptoms may represent a serious problem that is an emergency. Do not wait to see if the symptoms will go away. Get medical help at once. Call your local emergency services (911 in [place] Do not drive yourself to the hospital.  This information is not intended to replace advice given to you by your health care provider. Make sure you discuss any questions you have with your health care provider. Document Released: 08/20/2004 Document Revised: 12/19/2015 Document Reviewed: 01/13/2013 Elsevier Interactive Patient Education  2017 [place].    Before your next abdominal ultrasound:  Take two Extra-Strength Gas-X capsules at bedtime the night before the test. Take another two Extra-Strength Gas-X capsules 3 hours before the test.

## 2016-06-22 NOTE — Progress Notes (Signed)
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 (14-Jun-1946) male patient of Dr. Trula Slade who returns today for followup. He is status post left carotid endarterectomy for asymptomatic stenosis in November of 2013. He is status post right carotid endarterectomy in 2008.  He has also undergone a right renal artery stenting in 2009.    He is also concerned about gout in his left foot.    He denies any hx of stroke or TIA. He denies any claudication sx's in his legs with walking.   A1C was 5.7 on 06-11-16 (review of records).   He quit smoking in 2001.   The patient's blood pressure has been stable.  The patient's blood pressure medication regimen has remained stable.  The patient's urinary history has remained stable.  Past Medical History:  Diagnosis Date  . Arthritis   . Carotid artery occlusion   . DVT (deep venous thrombosis) ([place]) 2008  . DVT (deep venous thrombosis) ([place])   . GERD (gastroesophageal reflux disease)    takes Protonix daily  . Gout    takes Allopurinol daily and Indomethacin prn  . Gout flare   . H/O renal artery stenosis   . H/O: GI bleed   . History of blood transfusion   . History of colon polyps   . History of gastric ulcer   . Hyperlipidemia    takes Lipitor daily  . Hypertension    takes Metoprolol and Amlodipine daily  . Joint pain   . Joint swelling     Social History Social History  Substance Use Topics  . Smoking status: Former Smoker    Quit date: 01/02/2000  . Smokeless tobacco: Former Systems developer    Quit date: 12/26/1999  . Alcohol use No     Comment: Quit in 1998    Family History Family History  Problem Relation Age of Onset  . Hypertension Mother   . Hyperlipidemia Mother   . Other Mother     varicose veins  . Hyperlipidemia Father   . Hypertension Father   . Diabetes Father   . Clotting disorder Father   . Deep vein thrombosis Father     . Hypertension Sister   . Diabetes Sister   . Hyperlipidemia Sister   . Varicose Veins Sister   . Other Sister     Bleeding problems  . Hypertension Brother   . Hyperlipidemia Brother     Surgical History Past Surgical History:  Procedure Laterality Date  . CAROTID ENDARTERECTOMY  2008   right CEA  . CHOLECYSTECTOMY  mid 90's  . COLON SURGERY    . COLONOSCOPY    . ENDARTERECTOMY  06/02/2012   Procedure: ENDARTERECTOMY CAROTID;  Surgeon: Serafina Mitchell, MD;  Location: [facility] OR;  Service: Vascular;  Laterality: Left;  . ESOPHAGOGASTRODUODENOSCOPY    . HERNIA REPAIR     umbilical hernia  . PATCH ANGIOPLASTY  06/02/2012   Procedure: PATCH ANGIOPLASTY;  Surgeon: Serafina Mitchell, MD;  Location: [facility];  Service: Vascular;  Laterality: Left;  using Vascu-Guard Patch  . PERIPHERAL VASCULAR CATHETERIZATION Right 12/03/2015   Procedure: Peripheral Vascular Balloon Angioplasty;  Surgeon: Serafina Mitchell, MD;  Location: [facility];  Service: Cardiovascular;  Laterality: Right;  RENAL unsuccessful  . RENAL ARTERY STENT  2009   Right renal artery stenting-  Right Kidney  . small intestine removed  2001   d/t  random gi bleeding    Allergies  Allergen Reactions  . Plavix [Clopidogrel Bisulfate] Other (See Comments)    GI Bleed    Current Outpatient Prescriptions  Medication Sig Dispense Refill  . amLODipine (NORVASC) 10 MG tablet TAKE 1 TABLET BY MOUTH DAILY AT SUPPER 90 tablet 1  . aspirin 81 MG tablet Take 81 mg by mouth 2 (two) times daily with a meal.     . atorvastatin (LIPITOR) 40 MG tablet TAKE 1 TABLET BY MOUTH DAILY AT SUPPER 90 tablet 1  . metoprolol (LOPRESSOR) 50 MG tablet TAKE 1 TABLET BY MOUTH WITH BREAKFAST 90 tablet 1  . pantoprazole (PROTONIX) 40 MG tablet Take 1 tablet (40 mg total) by mouth daily with breakfast. 90 tablet 1  . acetaminophen (TYLENOL) 500 MG tablet Take 500 mg by mouth 2 (two) times daily as needed (pain).    . febuxostat (ULORIC) 40 MG tablet Take  1 tablet (40 mg total) by mouth daily. (Patient not taking: Reported on 06/22/2016) 90 tablet 0   No current facility-administered medications for this visit.     REVIEW OF SYSTEMS: see HPI for pertinent positives and negatives    Physical Examination  Vitals:   06/22/16 1043 06/22/16 1048  BP: (!) 212/99 (!) 209/94  Pulse: 84   Temp: 97 F (36.1 C)   Weight: 183 lb (83 kg)   Height: 5\' 10"  (1.778 m)    Body mass index is 26.26 kg/m.   General: The patient appears their stated age.   HEENT:  No gross abnormalities Pulmonary: Respirations are non-labored Abdomen: Soft and non-tender with normal pitched bowel sounds. Musculoskeletal: There are no major deformities.   Neurologic: No focal weakness or paresthesias are detected. Skin: There are no ulcer or rashes noted. Psychiatric: The patient has normal affect. Cardiovascular: There is a regular rate and rhythm without significant murmur appreciated.   Vascular: Vessel Right Left  Radial Palpable Palpable  Carotid  without bruit  without bruit  Aorta Not palpable N/A  Femoral Palpable Palpable  Popliteal Not palpable Not palpable  PT Palpable Palpable  DP Palpable Palpable    12-04-15 arteriogram:   Renal artery angiogram   Failed angioplasty, right renal artery Indications:  The patient has a history of right renal artery stenting.  Ultrasound recently suggested a high-grade stenosis.  There is a 2 cm discrepancy in the kidney size.  The right measures 9 cm left measures 11.  He is here today for further evaluation. Findings:              Aortogram:  Significant right renal artery stenosis is identified.  No significant left renal artery stenosis.  The infrarenal abdominal aorta is widely patent Impression:            occluded right renal artery stent.  The right kidney is perfused through a accessory vessel which does opacify the main renal artery.   Non-Invasive Vascular Imaging  Renal Duplex (Date:  06-22-16)  R kidney size: 9.37 cm  L kidney size: 12.4 cm  Cyst upper pole right kidney measures 1.34 cm x 1.54 cm.  Cyst in the mid pole of the left kidney measures 2.44 cm x 2.39 cm.  Decreased visualization of the abdominal vasculature and renal artery stent due to overlying bowel gas. Velocities of the right renal artery stent were not obtained due to overlying bowel gas. 141 cm/s velocity at right renal artery origin/proximal, tortuous.  225 cm/s velocity at the left renal artery origin/proximal.  Medical Decision Making  James [name] is a 70 y.o. male who presents with: bilateral RAS. He quit smoking in 2001. He takes a daily ASA, statin, and beta blocker. He does not have DM.   06-11-16 last serum creatinine result on file was 1.37 (review of records).   He takes 2 medications that lower his blood pressure. I advised pt to see his PCP as soon as possible regarding his very elevated blood pressure.  He states he has not yet taken his blood pressure medications today and that his blood pressure increases in a medical setting. He states his blood pressure at home is 140-170/45-75. States his blood pressure recently at his PCP's office was 138/86.    Based on the patient's vascular studies and examination, I have offered the patient: return in 2 months with bilateral renal arterial duplex after taking Gas-X, see pt instructions, see Dr. Trula Slade afterward. Carotid duplex in a year.   I discussed in depth with the patient the nature of atherosclerosis, and emphasized the importance of maximal medical management including strict control of blood pressure, blood glucose, and lipid levels, antiplatelet agents, obtaining regular exercise, and cessation of smoking.  The patient is aware that without maximal medical management the underlying atherosclerotic disease process will progress, limiting the benefit of any interventions.  Thank you for allowing [place] to participate in this  patient's care.  NICKEL, Sharmon Leyden, RN, MSN, FNP-C [facility] Office: [phone]  06/22/2016, 11:10 AM  Clinic MD:  Trula Slade

## 2016-06-23 ENCOUNTER — Ambulatory Visit (INDEPENDENT_AMBULATORY_CARE_PROVIDER_SITE_OTHER): Payer: Medicare Other | Admitting: Pulmonary Disease

## 2016-06-23 ENCOUNTER — Encounter: Payer: Self-pay | Admitting: Pulmonary Disease

## 2016-06-23 DIAGNOSIS — I701 Atherosclerosis of renal artery: Secondary | ICD-10-CM | POA: Diagnosis not present

## 2016-06-23 DIAGNOSIS — Z9989 Dependence on other enabling machines and devices: Secondary | ICD-10-CM

## 2016-06-23 DIAGNOSIS — G4733 Obstructive sleep apnea (adult) (pediatric): Secondary | ICD-10-CM | POA: Diagnosis not present

## 2016-06-23 NOTE — Patient Instructions (Signed)
  It was a pleasure taking care of you today!  Continue using your CPAP machine.   Please make sure you use your CPAP device everytime you sleep.  We will monitor the usage of your machine per your insurance requirement.  Your insurance company may take the machine from you if you are not using it regularly.   Please clean the mask, tubings, filter, water reservoir with soapy water every week.  Please use distilled water for the water reservoir.   Please call the office or your machine provider (DME company) if you are having issues with the device.   Return to clinic in 1 year  with Dr. De Dios    

## 2016-06-23 NOTE — Progress Notes (Signed)
Subjective:    Patient ID: James [name], male    DOB: 1946-05-29, 69 y.o.   MRN: [id number]  HPI   This is the case of James [name], 70 y.o. Male, who was referred by Dr. Vonna Kotyk dettinger  in consultation regarding possible OSA.   As you very well know, patient 60PY smoking history, quit in 2001.  No copd,asthma, sinus issues.   Has snoring, witnessed apneas, gasping, choking.  Has hypersomnia. Hypersomnia affects fxnality.  Has unrefreshed sleep.  (-) abn behavior in sleep.  Sleeps 7-8 hrs/night. Has naps in pm.  ESS 10.   ROV  06/22/16 Patient returns to the office as follow-up on his sleep apnea. Since I last saw him, he had a home sleep study which showed AHI was 26. Started on auto CPAP. Feels better using it. More energy. Less sleepiness. Download the last month: 100%, AHI 1.2.    Review of Systems  Constitutional: Negative.  Negative for fever and unexpected weight change.  HENT: Negative.  Negative for congestion, dental problem, ear pain, nosebleeds, postnasal drip, rhinorrhea, sinus pressure, sneezing, sore throat and trouble swallowing.   Eyes: Negative.  Negative for redness and itching.  Respiratory: Negative.  Negative for cough, chest tightness, shortness of breath and wheezing.   Cardiovascular: Negative.  Negative for palpitations.  Gastrointestinal: Negative.  Negative for nausea and vomiting.  Endocrine: Negative.   Genitourinary: Negative.  Negative for difficulty urinating and dysuria.  Musculoskeletal: Negative.  Negative for joint swelling.  Skin: Negative.  Negative for rash.  Allergic/Immunologic: Negative.   Neurological: Negative.  Negative for headaches.  Hematological: Negative.  Does not bruise/bleed easily.  Psychiatric/Behavioral: Negative.  Negative for dysphoric mood. The patient is not nervous/anxious.   All other systems reviewed and are negative.       Objective:   Physical Exam  Vitals:  Vitals:   06/23/16 1617    BP: (!) 150/80  Pulse: 70  SpO2: 98%  Weight: 183 lb 9.6 oz (83.3 kg)  Height: 5\' 10"  (1.778 m)    Constitutional/General:  Pleasant, well-nourished, well-developed, not in any distress,  Comfortably seating.  Well kempt  Body mass index is 26.34 kg/m. Wt Readings from Last 3 Encounters:  06/23/16 183 lb 9.6 oz (83.3 kg)  06/22/16 183 lb (83 kg)  06/11/16 183 lb 4 oz (83.1 kg)      HEENT: Pupils equal and reactive to light and accommodation. Anicteric sclerae. Normal nasal mucosa.   No oral  lesions,  mouth clear,  oropharynx clear, no postnasal drip. (-) Oral thrush. No dental caries.  Airway - Mallampati class III  Neck: No masses. Midline trachea. No JVD, (-) LAD. (-) bruits appreciated.  Respiratory/Chest: Grossly normal chest. (-) deformity. (-) Accessory muscle use.  Symmetric expansion. (-) Tenderness on palpation.  Resonant on percussion.  Diminished BS on both lower lung zones. (-) wheezing, crackles, rhonchi (-) egophony  Cardiovascular: Regular rate and  rhythm, heart sounds normal, no murmur or gallops, no peripheral edema  Gastrointestinal:  Normal bowel sounds. Soft, non-tender. No hepatosplenomegaly.  (-) masses.   Musculoskeletal:  Normal muscle tone. Normal gait.   Extremities: Grossly normal. (-) clubbing, cyanosis.  (-) edema  Skin: (-) rash,lesions seen.   Neurological/Psychiatric : alert, oriented to time, place, person. Normal mood and affect            Assessment & Plan:  OSA on CPAP Has snoring, witnessed apneas, gasping, choking.  Has hypersomnia. Hypersomnia affects fxnality.  Has  unrefreshed sleep.  (-) abn behavior in sleep.  Sleeps 7-8 hrs/night. Has naps in pm.  ESS 10. She went home sleep study in June  2017: AHI 26. Autocpap 5-15 cm water.   Feels better using cpap. More energy. Less sleepiness. Feels benefit of cpap.  DL last month : 100%, AHI 1.2  Plan :  We extensively discussed the importance of treating  OSA and the need to use PAP therapy.   Continue with  autocpap 5-15 cm water.    Patient was instructed to have mask, tubings, filter, reservoir cleaned at least once a week with soapy water.  Patient was instructed to call the office if he/she is having issues with the PAP device.    I advised patient to obtain sufficient amount of sleep --  7 to 8 hours at least in a 24 hr period.  Patient was advised to follow good sleep hygiene.  Patient was advised NOT to engage in activities requiring concentration and/or vigilance if he/she is and  sleepy.  Patient is NOT to drive if he/she is sleepy.      Patient will follow up with me in 1 year    J. Shirl Harris, MD 06/23/2016, 4:35 PM [facility] Pager [phone] After 3 pm or if no answer, call [phone]

## 2016-06-23 NOTE — Assessment & Plan Note (Signed)
Has snoring, witnessed apneas, gasping, choking.  Has hypersomnia. Hypersomnia affects fxnality.  Has unrefreshed sleep.  (-) abn behavior in sleep.  Sleeps 7-8 hrs/night. Has naps in pm.  ESS 10. She went home sleep study in June  2017: AHI 26. Autocpap 5-15 cm water.   Feels better using cpap. More energy. Less sleepiness. Feels benefit of cpap.  DL last month : 100%, AHI 1.2  Plan :  We extensively discussed the importance of treating OSA and the need to use PAP therapy.   Continue with  autocpap 5-15 cm water.    Patient was instructed to have mask, tubings, filter, reservoir cleaned at least once a week with soapy water.  Patient was instructed to call the office if he/she is having issues with the PAP device.    I advised patient to obtain sufficient amount of sleep --  7 to 8 hours at least in a 24 hr period.  Patient was advised to follow good sleep hygiene.  Patient was advised NOT to engage in activities requiring concentration and/or vigilance if he/she is and  sleepy.  Patient is NOT to drive if he/she is sleepy.

## 2016-06-29 ENCOUNTER — Telehealth: Payer: Self-pay | Admitting: Family Medicine

## 2016-06-29 MED ORDER — ALLOPURINOL 100 MG PO TABS: 200.0000 mg | ORAL_TABLET | Freq: Every day | ORAL | 3 refills | Status: DC

## 2016-06-29 NOTE — Telephone Encounter (Signed)
Okay go ahead and send him a refill for the allopurinol, give him a year's worth

## 2016-06-29 NOTE — Telephone Encounter (Signed)
Per pt, his Insurance won't cover Uloric Pt would like on stay on Allopurinol Please review and advise

## 2016-06-29 NOTE — Telephone Encounter (Signed)
Med sent to mail order for 1 year --- BUSY at pt #[id number]

## 2016-08-24 ENCOUNTER — Encounter: Payer: Self-pay | Admitting: Surgery

## 2016-08-31 ENCOUNTER — Encounter: Payer: Self-pay | Admitting: Surgery

## 2016-08-31 ENCOUNTER — Other Ambulatory Visit: Payer: Self-pay | Admitting: *Deleted

## 2016-08-31 ENCOUNTER — Other Ambulatory Visit: Payer: Self-pay | Admitting: Family Medicine

## 2016-08-31 ENCOUNTER — Ambulatory Visit (HOSPITAL_COMMUNITY)
Admission: RE | Admit: 2016-08-31 | Discharge: 2016-08-31 | Disposition: A | Discharge status: Home / Self Care | Payer: Medicare Other | Source: Ambulatory Visit | Attending: Surgery | Admitting: Surgery

## 2016-08-31 ENCOUNTER — Ambulatory Visit (INDEPENDENT_AMBULATORY_CARE_PROVIDER_SITE_OTHER): Payer: Medicare Other | Admitting: Surgery

## 2016-08-31 VITALS — BP 218/102 | HR 68 | Temp 97.4°F | Resp 16 | Ht 70.0 in | Wt 176.0 lb

## 2016-08-31 DIAGNOSIS — Z95820 Peripheral vascular angioplasty status with implants and grafts: Secondary | ICD-10-CM | POA: Insufficient documentation

## 2016-08-31 DIAGNOSIS — I6523 Occlusion and stenosis of bilateral carotid arteries: Secondary | ICD-10-CM

## 2016-08-31 DIAGNOSIS — N281 Cyst of kidney, acquired: Secondary | ICD-10-CM | POA: Insufficient documentation

## 2016-08-31 DIAGNOSIS — I701 Atherosclerosis of renal artery: Secondary | ICD-10-CM | POA: Diagnosis not present

## 2016-08-31 DIAGNOSIS — R2242 Localized swelling, mass and lump, left lower limb: Secondary | ICD-10-CM

## 2016-08-31 DIAGNOSIS — Z86718 Personal history of other venous thrombosis and embolism: Secondary | ICD-10-CM | POA: Diagnosis not present

## 2016-08-31 DIAGNOSIS — Z9889 Other specified postprocedural states: Secondary | ICD-10-CM

## 2016-08-31 DIAGNOSIS — R143 Flatulence: Secondary | ICD-10-CM | POA: Diagnosis not present

## 2016-08-31 DIAGNOSIS — I739 Peripheral vascular disease, unspecified: Secondary | ICD-10-CM

## 2016-08-31 NOTE — Progress Notes (Signed)
[facility]  Patient name: James [name] MRN: [id number] DOB: [birth year] Sex: male   REASON FOR VISIT:    Follow up  HISOTRY OF PRESENT ILLNESS:    James [name] is a 71 y.o. male returns today for follow-up.  He is status post left carotid endarterectomy for asymptomatic stenosis in November 2013.  He is status post right carotid endarterectomy in 2008.  In 2009 he underwent a right renal artery stenting secondary to elevated creatinine and blood pressure.  His creatinine has remained stable.  He does suffer from whitecoat hypertension.  Last year he was identified to have a progressive stenosis within his right renal artery stent and on 12/03/2015 he underwent renal angiography.  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  The right kidney was perfused to an accessory vessel which did appear to opacify the main renal artery.  He is back today for follow-up with no complaints.  He does state that he has more prominent swelling in his left leg.  He thinks this is more than just what has happened from the gout.  He does have a history of DVT.   PAST MEDICAL HISTORY:   Past Medical History:  Diagnosis Date  . Arthritis   . Carotid artery occlusion   . DVT (deep venous thrombosis) ([place]) 2008  . DVT (deep venous thrombosis) ([place])   . GERD (gastroesophageal reflux disease)    takes Protonix daily  . Gout    takes Allopurinol daily and Indomethacin prn  . Gout flare   . H/O renal artery stenosis   . H/O: GI bleed   . History of blood transfusion   . History of colon polyps   . History of gastric ulcer   . Hyperlipidemia    takes Lipitor daily  . Hypertension    takes Metoprolol and Amlodipine daily  . Joint pain   . Joint swelling      FAMILY HISTORY:   Family History  Problem Relation Age of Onset  . Hypertension Mother   . Hyperlipidemia Mother   . Other Mother    varicose veins  . Hyperlipidemia Father   . Hypertension Father   . Diabetes Father   . Clotting disorder Father   . Deep vein thrombosis Father   . Hypertension Sister   . Diabetes Sister   . Hyperlipidemia Sister   . Varicose Veins Sister   . Other Sister     Bleeding problems  . Hypertension Brother   . Hyperlipidemia Brother     SOCIAL HISTORY:   Social History  Substance Use Topics  . Smoking status: Former Smoker    Quit date: 01/02/2000  . Smokeless tobacco: Former Systems developer    Quit date: 12/26/1999  . Alcohol use No     Comment: Quit in 1998     ALLERGIES:   Allergies  Allergen Reactions  . Plavix [Clopidogrel Bisulfate] Other (See Comments)    GI Bleed     CURRENT MEDICATIONS:   Current Outpatient Prescriptions  Medication Sig Dispense Refill  . acetaminophen (TYLENOL) 500 MG tablet Take 500 mg by mouth 2 (two) times daily as needed (pain).    Marland Kitchen allopurinol (ZYLOPRIM) 100 MG tablet Take 2 tablets (200 mg total) by mouth daily. 180 tablet 3  . amLODipine (NORVASC) 10 MG tablet TAKE 1 TABLET BY MOUTH DAILY AT SUPPER 90 tablet 1  . aspirin 81 MG tablet Take 81 mg by mouth 2 (two)  times daily with a meal.     . atorvastatin (LIPITOR) 40 MG tablet TAKE 1 TABLET BY MOUTH DAILY AT SUPPER 90 tablet 1  . metoprolol (LOPRESSOR) 50 MG tablet TAKE 1 TABLET BY MOUTH WITH BREAKFAST 90 tablet 1  . pantoprazole (PROTONIX) 40 MG tablet Take 1 tablet (40 mg total) by mouth daily with breakfast. 90 tablet 1   No current facility-administered medications for this visit.     REVIEW OF SYSTEMS:   [X]  denotes positive finding, [ ]  denotes negative finding Cardiac  Comments:  Chest pain or chest pressure:    Shortness of breath upon exertion:    Short of breath when lying flat:    Irregular heart rhythm:        Vascular    Pain in calf, thigh, or hip brought on by ambulation:    Pain in feet at night that wakes you up from your sleep:     Blood clot in your veins:    Leg  swelling:  x       Pulmonary    Oxygen at home:    Productive cough:     Wheezing:         Neurologic    Sudden weakness in arms or legs:     Sudden numbness in arms or legs:     Sudden onset of difficulty speaking or slurred speech:    Temporary loss of vision in one eye:     Problems with dizziness:         Gastrointestinal    Blood in stool:     Vomited blood:         Genitourinary    Burning when urinating:     Blood in urine:        Psychiatric    Major depression:         Hematologic    Bleeding problems:    Problems with blood clotting too easily:        Skin    Rashes or ulcers:        Constitutional    Fever or chills:      PHYSICAL EXAM:   Vitals:   08/31/16 0956 08/31/16 0959  BP: (!) 216/94 (!) 218/102  Pulse: 68   Resp: 16   Temp: 97.4 F (36.3 C)   TempSrc: Oral   SpO2: 97%   Weight: 176 lb (79.8 kg)   Height: 5\' 10"  (1.778 m)     GENERAL: The patient is a well-nourished male, in no acute distress. The vital signs are documented above. CARDIAC: There is a regular rate and rhythm.  VASCULAR: Left leg edema greater than right PULMONARY: Non-labored respirations ABDOMEN: Soft and non-tender with normal pitched bowel sounds.  MUSCULOSKELETAL: There are no major deformities or cyanosis. NEUROLOGIC: No focal weakness or paresthesias are detected. SKIN: There are no ulcers or rashes noted. PSYCHIATRIC: The patient has a normal affect.  STUDIES:   Renal duplex shows difficulty evaluate renal artery.  Right kidney size remains 8.5 cm.  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  There is no need to continue with ultrasound surveillance.  The right kidney has changed in size now measuring 8.5 cm.  He does not have worsening hypertension or renal function.  Left leg swelling: The patient has a history of DVT.  I'm uncertain as to which leg this was.  On exam his  left leg  is significantly more swollen than the right.  We've talked about wearing compression stockings.  I'm going to have him come back for a iliofemoral ultrasound as well as a reflux evaluation of the left leg.    Annamarie Major, MD [facility] [phone] Pager [phone]

## 2016-09-02 NOTE — Addendum Note (Signed)
Addended by: Lianne Cure A on: 09/02/2016 11:49 AM   Modules accepted: Orders

## 2016-09-03 ENCOUNTER — Other Ambulatory Visit: Payer: Self-pay | Admitting: Family Medicine

## 2016-09-03 DIAGNOSIS — I10 Essential (primary) hypertension: Secondary | ICD-10-CM

## 2016-09-07 ENCOUNTER — Other Ambulatory Visit: Payer: Self-pay | Admitting: Family Medicine

## 2016-09-07 ENCOUNTER — Encounter (HOSPITAL_COMMUNITY): Payer: Medicare Other

## 2016-09-07 ENCOUNTER — Ambulatory Visit: Payer: Medicare Other | Admitting: Surgery

## 2016-09-07 DIAGNOSIS — E785 Hyperlipidemia, unspecified: Secondary | ICD-10-CM

## 2016-09-07 DIAGNOSIS — I10 Essential (primary) hypertension: Secondary | ICD-10-CM

## 2016-10-02 ENCOUNTER — Encounter: Payer: Self-pay | Admitting: Surgery

## 2016-10-05 ENCOUNTER — Encounter (HOSPITAL_COMMUNITY): Payer: Medicare Other

## 2016-10-05 ENCOUNTER — Ambulatory Visit: Payer: Medicare Other | Admitting: Surgery

## 2016-10-12 ENCOUNTER — Ambulatory Visit: Payer: Medicare Other | Admitting: Surgery

## 2016-10-12 ENCOUNTER — Encounter (HOSPITAL_COMMUNITY): Payer: Medicare Other

## 2016-10-14 ENCOUNTER — Ambulatory Visit (HOSPITAL_COMMUNITY): Admission: RE | Admit: 2016-10-14 | Payer: Medicare Other | Source: Ambulatory Visit

## 2016-10-14 ENCOUNTER — Ambulatory Visit: Payer: Medicare Other | Admitting: Surgery

## 2016-10-14 ENCOUNTER — Encounter (HOSPITAL_COMMUNITY): Payer: Medicare Other

## 2016-10-20 ENCOUNTER — Encounter (HOSPITAL_COMMUNITY): Payer: Medicare Other

## 2016-10-20 ENCOUNTER — Ambulatory Visit: Payer: Medicare Other | Admitting: Vascular Surgery

## 2016-11-14 ENCOUNTER — Other Ambulatory Visit: Payer: Self-pay | Admitting: Family Medicine

## 2016-11-14 DIAGNOSIS — I10 Essential (primary) hypertension: Secondary | ICD-10-CM

## 2016-12-01 ENCOUNTER — Ambulatory Visit (HOSPITAL_COMMUNITY)
Admission: RE | Admit: 2016-12-01 | Discharge: 2016-12-01 | Disposition: A | Discharge status: Home / Self Care | Payer: Medicare Other | Source: Ambulatory Visit | Attending: Surgery | Admitting: Surgery

## 2016-12-01 DIAGNOSIS — Z9889 Other specified postprocedural states: Secondary | ICD-10-CM | POA: Diagnosis not present

## 2016-12-01 DIAGNOSIS — I739 Peripheral vascular disease, unspecified: Secondary | ICD-10-CM | POA: Diagnosis not present

## 2016-12-01 DIAGNOSIS — R609 Edema, unspecified: Secondary | ICD-10-CM | POA: Diagnosis not present

## 2016-12-07 ENCOUNTER — Ambulatory Visit (INDEPENDENT_AMBULATORY_CARE_PROVIDER_SITE_OTHER): Payer: Medicare Other | Admitting: Surgery

## 2016-12-07 ENCOUNTER — Encounter: Payer: Self-pay | Admitting: Surgery

## 2016-12-07 VITALS — BP 184/83 | HR 67 | Temp 98.7°F | Resp 20 | Ht 70.0 in | Wt 184.0 lb

## 2016-12-07 DIAGNOSIS — I701 Atherosclerosis of renal artery: Secondary | ICD-10-CM | POA: Diagnosis not present

## 2016-12-07 DIAGNOSIS — M7989 Other specified soft tissue disorders: Secondary | ICD-10-CM | POA: Diagnosis not present

## 2016-12-07 NOTE — Progress Notes (Signed)
[facility] [place]  Patient name: James [name] MRN: [id number] DOB: [birth year] Sex: male   REASON FOR VISIT:    Follow up  HISOTRY OF PRESENT ILLNESS:    James [name] is a 71 y.o. male returns today for follow-up.  He is status post left carotid endarterectomy for asymptomatic stenosis in November 2013.  He is status post right carotid endarterectomy in 2008.  In 2009 he underwent a right renal artery stenting secondary to elevated creatinine and blood pressure.  His creatinine has remained stable.  He does suffer from whitecoat hypertension.  Last year he was identified to have a progressive stenosis within his right renal artery stent and on 12/03/2015 he underwent renal angiography.  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  The right kidney was perfused to an accessory vessel which did appear to opacify the main renal artery.  He is back today for follow-up with no complaints.  At his last visit he was concerned about worsening swelling in his left leg.  He does have a history of DVT.  He underwent venous reflux evaluation and is here today to discuss the results.  PAST MEDICAL HISTORY:   Past Medical History:  Diagnosis Date  . Arthritis   . Carotid artery occlusion   . DVT (deep venous thrombosis) ([place]) 2008  . DVT (deep venous thrombosis) ([place])   . GERD (gastroesophageal reflux disease)    takes Protonix daily  . Gout    takes Allopurinol daily and Indomethacin prn  . Gout flare   . H/O renal artery stenosis   . H/O: GI bleed   . History of blood transfusion   . History of colon polyps   . History of gastric ulcer   . Hyperlipidemia    takes Lipitor daily  . Hypertension    takes Metoprolol and Amlodipine daily  . Joint pain   . Joint swelling      FAMILY HISTORY:   Family History  Problem Relation Age of Onset  . Hypertension Mother   . Hyperlipidemia Mother   .  Other Mother        varicose veins  . Hyperlipidemia Father   . Hypertension Father   . Diabetes Father   . Clotting disorder Father   . Deep vein thrombosis Father   . Hypertension Sister   . Diabetes Sister   . Hyperlipidemia Sister   . Varicose Veins Sister   . Other Sister        Bleeding problems  . Hypertension Brother   . Hyperlipidemia Brother     SOCIAL HISTORY:   Social History  Substance Use Topics  . Smoking status: Former Smoker    Quit date: 01/02/2000  . Smokeless tobacco: Former Systems developer    Quit date: 12/26/1999  . Alcohol use No     Comment: Quit in 1998     ALLERGIES:   Allergies  Allergen Reactions  . Plavix [Clopidogrel Bisulfate] Other (See Comments)    GI Bleed     CURRENT MEDICATIONS:   Current Outpatient Prescriptions  Medication Sig Dispense Refill  . acetaminophen (TYLENOL) 500 MG tablet Take 500 mg by mouth 2 (two) times daily as needed (pain).    Marland Kitchen allopurinol (ZYLOPRIM) 100 MG tablet Take 2 tablets (200 mg total) by mouth daily. 180 tablet 3  . amLODipine (NORVASC) 10 MG tablet TAKE 1 TABLET DAILY AT SUPPER 90 tablet 1  . aspirin 81 MG tablet Take  81 mg by mouth 2 (two) times daily with a meal.     . atorvastatin (LIPITOR) 40 MG tablet TAKE 1 TABLET DAILY AT SUPPER 90 tablet 1  . metoprolol (LOPRESSOR) 50 MG tablet TAKE 1 TABLET WITH BREAKFAST 90 tablet 0  . pantoprazole (PROTONIX) 40 MG tablet TAKE 1 TABLET DAILY WITH BREAKFAST 90 tablet 1   No current facility-administered medications for this visit.     REVIEW OF SYSTEMS:   [X]  denotes positive finding, [ ]  denotes negative finding Cardiac  Comments:  Chest pain or chest pressure:    Shortness of breath upon exertion:    Short of breath when lying flat:    Irregular heart rhythm:        Vascular    Pain in calf, thigh, or hip brought on by ambulation:    Pain in feet at night that wakes you up from your sleep:     Blood clot in your veins:    Leg swelling:  x       Pulmonary     Oxygen at home:    Productive cough:     Wheezing:         Neurologic    Sudden weakness in arms or legs:     Sudden numbness in arms or legs:     Sudden onset of difficulty speaking or slurred speech:    Temporary loss of vision in one eye:     Problems with dizziness:         Gastrointestinal    Blood in stool:     Vomited blood:         Genitourinary    Burning when urinating:     Blood in urine:        Psychiatric    Major depression:         Hematologic    Bleeding problems:    Problems with blood clotting too easily:        Skin    Rashes or ulcers:        Constitutional    Fever or chills:      PHYSICAL EXAM:   Vitals:   12/07/16 0902 12/07/16 0904  BP: (!) 187/86 (!) 184/83  Pulse: 67   Resp: 20   Temp: 98.7 F (37.1 C)   TempSrc: Oral   SpO2: 96%   Weight: 184 lb (83.5 kg)   Height: 5\' 10"  (1.778 m)     GENERAL: The patient is a well-nourished male, in no acute distress. The vital signs are documented above. CARDIAC: There is a regular rate and rhythm.  VASCULAR: Bilateral LE edema PULMONARY: Non-labored respirations MUSCULOSKELETAL: There are no major deformities or cyanosis. NEUROLOGIC: No focal weakness or paresthesias are detected. SKIN: There are no ulcers or rashes noted. PSYCHIATRIC: The patient has a normal affect.  STUDIES:   I have reviewed his venous reflux evaluation Iliac/IVC: Both veins are patent without obstruction Lower extremity reflux: No significant deep vein reflux.  There is a short segment of reflux within the left great saphenous vein.  Diameters are all less than 0.3 cm.  MEDICAL ISSUES:   The patient is encouraged to continue wearing his compression stockings.  I'm also making a referral to the lymphedema clinic to see if there is anything else we can do to help with his swelling.  I have scheduled for follow-up for his carotids in one year.    Annamarie Major, MD [facility]  [facility] [phone] Pager [phone]

## 2016-12-10 ENCOUNTER — Encounter: Payer: Self-pay | Admitting: Surgery

## 2016-12-23 ENCOUNTER — Ambulatory Visit (HOSPITAL_COMMUNITY): Payer: Medicare Other | Attending: Surgery | Admitting: Physical Therapy

## 2016-12-23 DIAGNOSIS — I890 Lymphedema, not elsewhere classified: Secondary | ICD-10-CM | POA: Diagnosis not present

## 2016-12-23 NOTE — Therapy (Signed)
[place], [place], [address] Phone: [phone]   Fax:  [phone]  Physical Therapy Evaluation  Patient Details  Name: James [name] MRN: [id number] Date of Birth: Sep 05, 1945 Referring Provider: Harold Barban  Encounter Date: 12/23/2016      PT End of Session - 12/23/16 1616    Visit Number 1   Number of Visits 9   Date for PT Re-Evaluation 01/22/17   Authorization Type medicare   Authorization - Visit Number 1   Authorization - Number of Visits 9   PT Start Time 1638   PT Stop Time 1518   PT Time Calculation (min) 42 min   Activity Tolerance Patient tolerated treatment well   Behavior During Therapy [facility] for tasks assessed/performed      Past Medical History:  Diagnosis Date  . Arthritis   . Carotid artery occlusion   . DVT (deep venous thrombosis) ([place]) 2008  . DVT (deep venous thrombosis) ([place])   . GERD (gastroesophageal reflux disease)    takes Protonix daily  . Gout    takes Allopurinol daily and Indomethacin prn  . Gout flare   . H/O renal artery stenosis   . H/O: GI bleed   . History of blood transfusion   . History of colon polyps   . History of gastric ulcer   . Hyperlipidemia    takes Lipitor daily  . Hypertension    takes Metoprolol and Amlodipine daily  . Joint pain   . Joint swelling     Past Surgical History:  Procedure Laterality Date  . CAROTID ENDARTERECTOMY  2008   right CEA  . CHOLECYSTECTOMY  mid 90's  . COLON SURGERY    . COLONOSCOPY    . ENDARTERECTOMY  06/02/2012   Procedure: ENDARTERECTOMY CAROTID;  Surgeon: Serafina Mitchell, MD;  Location: [facility] OR;  Service: Vascular;  Laterality: Left;  . ESOPHAGOGASTRODUODENOSCOPY    . HERNIA REPAIR     umbilical hernia  . PATCH ANGIOPLASTY  06/02/2012   Procedure: PATCH ANGIOPLASTY;  Surgeon: Serafina Mitchell, MD;  Location: [facility];  Service: Vascular;  Laterality: Left;  using Vascu-Guard Patch  . PERIPHERAL VASCULAR  CATHETERIZATION Right 12/03/2015   Procedure: Peripheral Vascular Balloon Angioplasty;  Surgeon: Serafina Mitchell, MD;  Location: [facility];  Service: Cardiovascular;  Laterality: Right;  RENAL unsuccessful  . RENAL ARTERY STENT  2009   Right renal artery stenting-  Right Kidney  . small intestine removed  2001   d/t random gi bleeding    There were no vitals filed for this visit.       Subjective Assessment - 12/23/16 1625    Subjective James [name] states that he has noticed progressive swelling of his legs with the left being more involved than the right.  He has tried compression stocking but they seem to dig into him at certain areas causing bottlenecking.  He has noticed that the swelling is less if he is active and greater if he has drank sodas or tea.  It is difficult for him to put his socks and shoes on.    Pertinent History Lt carotid endarectomy; Rt renal stent.  Pt has been checked for DVT and is negative.    Currently in Pain? No/denies            [facility] PT Assessment - 12/23/16 0001      Assessment   Medical Diagnosis Lt LE lymphedema   Referring  Provider Harold Barban   Onset Date/Surgical Date 05/27/17   Next MD Visit unknown   Prior Therapy none      Precautions   Precautions None     Restrictions   Weight Bearing Restrictions No     Balance Screen   Has the patient fallen in the past 6 months No   Has the patient had a decrease in activity level because of a fear of falling?  No   Is the patient reluctant to leave their home because of a fear of falling?  Yes     Prior Function   Level of Independence Independent     Cognition   Overall Cognitive Status Within Functional Limits for tasks assessed     Observation/Other Assessments   Focus on Therapeutic Outcomes (FOTO)  --  Life impact 27           LYMPHEDEMA/ONCOLOGY QUESTIONNAIRE - 12/23/16 1442      What other symptoms do you have   Are you Having Heaviness or Tightness Yes    Are you having Pain No   Are you having pitting edema Yes   Body Site --  foot   Is it Hard or Difficult finding clothes that fit Yes   Do you have infections No     Lymphedema Stage   Stage STAGE 1 SPONTANEOUSLY REVERSIBLE     Lymphedema Assessments   Lymphedema Assessments Lower extremities     Right Lower Extremity Lymphedema   At Midpatella/Popliteal Crease 40.8 cm   30 cm Proximal to Floor at Lateral Plantar Foot 37 cm   20 cm Proximal to Floor at Lateral Plantar Foot 29.5 1   10  cm Proximal to Floor at Lateral Malleoli 28 cm   Circumference of ankle/heel 37 cm.   5 cm Proximal to 1st MTP Joint 25.5 cm   Across MTP Joint 25.3 cm   Around Proximal Great Toe 9.4 cm     Left Lower Extremity Lymphedema   At Midpatella/Popliteal Crease 42 cm   30 cm Proximal to Floor at Lateral Plantar Foot 39 cm   20 cm Proximal to Floor at Lateral Plantar Foot 31.9 cm   10 cm Proximal to Floor at Lateral Malleoli 32 cm   Circumference of ankle/heel 37 cm.   5 cm Proximal to 1st MTP Joint 31.4 cm   Across MTP Joint 31.2 cm   Around Proximal Great Toe 10.2 cm         Objective measurements completed on examination: See above findings.          [facility] Adult PT Treatment/Exercise - 12/23/16 0001      Manual Therapy   Manual Therapy Manual Lymphatic Drainage (MLD)   Manual therapy comments To include supra clavicular, deep and superfical abdominal, routing fluid using inguinal/axillary anastomisi on the anterior aspect of the Lt side only due to time restraints.                 PT Education - 12/23/16 1614    Education provided Yes   Education Details Education sheet on lymphedema as well as LE exercises to promote lymphatic circulation.   Person(s) Educated Patient   Methods Explanation;Handout   Comprehension Verbalized understanding          PT Short Term Goals - 12/23/16 1634      PT SHORT TERM GOAL #1   Title Pt volume in Lt LE to be decreased by 2 cm to ease  donning of shoes and  socks.    Time 10   Period Days   Status New     PT SHORT TERM GOAL #2   Title Pt to be able to verbalize signs and symptoms of cellulitis and to understand that he must seek medical attention immediately.   Time 10   Period Days   Status New     PT SHORT TERM GOAL #3   Title Pt to understand that lymphedema  is chronic and  progressive in nature and that he will need to wear compression for the rest of his life.    Time 3   Period Weeks   Status New           PT Long Term Goals - 01-08-17 1637      PT LONG TERM GOAL #1   Title Pt to have obtained and be able to don his compression garment    Time 3   Period Weeks   Status New     PT LONG TERM GOAL #2   Title Pt to have acquired and be using his compression pump   Time 3   Period Weeks   Status New                Plan - January 08, 2017 1630    Clinical Impression Statement James [name] has been referred for skilled physical therapy for lymphedema.  Examination demonstrates increased pitting edema in B LE with left greater than right.  James [name] will benefit from skilled physical therapy for decongestive techniques along with multilayer compression wrapping.    History and Personal Factors relevant to plan of care: Lt carotid endarectomy, Rt renal stent    Clinical Presentation Evolving   Clinical Presentation due to: progressive edema    Clinical Decision Making Moderate   Rehab Potential Good   PT Frequency 3x / week   PT Duration 3 weeks   PT Treatment/Interventions ADLs/Self Care Home Management;Manual lymph drainage;Manual techniques   PT Next Visit Plan Pt to recieve anterior and posterior LE treatment as well as multiple layer compression wrapping,(profore) B.  Order for compression garment and request for compression pump with [facility] care has been sent.       Patient will benefit from skilled therapeutic intervention in order to improve the following deficits and impairments:   Increased edema  Visit Diagnosis: Lymphedema, not elsewhere classified      G-Codes - 2017-01-08 1638    Functional Assessment Tool Used (Outpatient Only) life impact score    Functional Limitation Other PT primary   Other PT Primary Current Status (X9371) At least 20 percent but less than 40 percent impaired, limited or restricted   Other PT Primary Goal Status (I9678) At least 1 percent but less than 20 percent impaired, limited or restricted       Problem List Patient Active Problem List   Diagnosis Date Noted  . OSA on CPAP 03/09/2016  . Hypersomnia 01/07/2016  . Exertional dyspnea 01/07/2016  . Back pain 08/01/2015  . Gastroesophageal reflux disease with esophagitis 07/17/2015  . Essential hypertension, benign 07/17/2015  . S/P arterial stent 04/29/2015  . Renal artery stenosis ([place]) 12/26/2012  . Hyperlipidemia LDL goal <100 10/11/2012  . Gout 10/11/2012  . Occlusion and stenosis of carotid artery without mention of cerebral infarction 10/12/2011   Rayetta Humphrey, PT CLT [phone] January 08, 2017, 4:39 PM  [facility] [address] [place], [place], [address] Phone: [phone]   Fax:  [phone]  Name:  Patton Rabinovich Stollings MRN: [id number] Date of Birth: 1946/04/17

## 2016-12-25 ENCOUNTER — Ambulatory Visit (HOSPITAL_COMMUNITY): Payer: Medicare Other | Attending: Surgery | Admitting: Physical Therapy

## 2016-12-25 DIAGNOSIS — I890 Lymphedema, not elsewhere classified: Secondary | ICD-10-CM | POA: Insufficient documentation

## 2016-12-25 NOTE — Therapy (Signed)
[facility], [place], [address] Phone: [phone]   Fax:  [phone]  Physical Therapy Treatment  Patient Details  Name: James [name] MRN: [id number] Date of Birth: April 12, 1946 Referring Provider: Harold Barban  Encounter Date: 12/25/2016      PT End of Session - 12/25/16 1517    Visit Number 2   Number of Visits 9   Date for PT Re-Evaluation 01/22/17   Authorization Type medicare   Authorization - Visit Number 2   Authorization - Number of Visits 9   PT Start Time 1300   PT Stop Time 1346   PT Time Calculation (min) 46 min   Activity Tolerance Patient tolerated treatment well   Behavior During Therapy [facility] for tasks assessed/performed      Past Medical History:  Diagnosis Date  . Arthritis   . Carotid artery occlusion   . DVT (deep venous thrombosis) ([place]) 2008  . DVT (deep venous thrombosis) ([place])   . GERD (gastroesophageal reflux disease)    takes Protonix daily  . Gout    takes Allopurinol daily and Indomethacin prn  . Gout flare   . H/O renal artery stenosis   . H/O: GI bleed   . History of blood transfusion   . History of colon polyps   . History of gastric ulcer   . Hyperlipidemia    takes Lipitor daily  . Hypertension    takes Metoprolol and Amlodipine daily  . Joint pain   . Joint swelling     Past Surgical History:  Procedure Laterality Date  . CAROTID ENDARTERECTOMY  2008   right CEA  . CHOLECYSTECTOMY  mid 90's  . COLON SURGERY    . COLONOSCOPY    . ENDARTERECTOMY  06/02/2012   Procedure: ENDARTERECTOMY CAROTID;  Surgeon: Serafina Mitchell, MD;  Location: [facility] OR;  Service: Vascular;  Laterality: Left;  . ESOPHAGOGASTRODUODENOSCOPY    . HERNIA REPAIR     umbilical hernia  . PATCH ANGIOPLASTY  06/02/2012   Procedure: PATCH ANGIOPLASTY;  Surgeon: Serafina Mitchell, MD;  Location: [facility];  Service: Vascular;  Laterality: Left;  using Vascu-Guard Patch  . PERIPHERAL VASCULAR  CATHETERIZATION Right 12/03/2015   Procedure: Peripheral Vascular Balloon Angioplasty;  Surgeon: Serafina Mitchell, MD;  Location: [place] [facility];  Service: Cardiovascular;  Laterality: Right;  RENAL unsuccessful  . RENAL ARTERY STENT  2009   Right renal artery stenting-  Right Kidney  . small intestine removed  2001   d/t random gi bleeding    There were no vitals filed for this visit.      Subjective Assessment - 12/25/16 1515    Subjective Pt states that he went to the lymphedema site and read a little about his problem    Pertinent History Lt carotid endarectomy; Rt renal stent.  Pt has been checked for DVT and is negative.    Currently in Pain? No/denies                         [facility] Adult PT Treatment/Exercise - 12/25/16 0001      Manual Therapy   Manual Therapy Manual Lymphatic Drainage (MLD)   Manual therapy comments To include supra clavicular, deep and superfical abdominal, routing fluid using inguinal/axillary anastomsis both anteriorly and posteriorly followed by donning of multi layer compression bandaging,(profore wrap.  Treatment done bilaterally    Manual Lymphatic Drainage (MLD) done bilaterally  PT Short Term Goals - 12/23/16 1634      PT SHORT TERM GOAL #1   Title Pt volume in Lt LE to be decreased by 2 cm to ease donning of shoes and socks.    Time 10   Period Days   Status New     PT SHORT TERM GOAL #2   Title Pt to be able to verbalize signs and symptoms of cellulitis and to understand that he must seek medical attention immediately.   Time 10   Period Days   Status New     PT SHORT TERM GOAL #3   Title Pt to understand that lymphedema  is chronic and  progressive in nature and that he will need to wear compression for the rest of his life.    Time 3   Period Weeks   Status New           PT Long Term Goals - 12/23/16 1637      PT LONG TERM GOAL #1   Title Pt to have obtained and be able to don his  compression garment    Time 3   Period Weeks   Status New     PT LONG TERM GOAL #2   Title Pt to have acquired and be using his compression pump   Time 3   Period Weeks   Status New               Plan - 12/25/16 1517    Clinical Impression Statement Evaluation and goals reviewed with patient.  Pt encouraged to allow lymphedema website to be a resource for him.    Rehab Potential Good   PT Frequency 3x / week   PT Duration 3 weeks   PT Treatment/Interventions ADLs/Self Care Home Management;Manual lymph drainage;Manual techniques   PT Next Visit Plan Assess comfort of profore; Measure LE next week, check on order for compression garment       Patient will benefit from skilled therapeutic intervention in order to improve the following deficits and impairments:  Increased edema  Visit Diagnosis: Lymphedema, not elsewhere classified     Problem List Patient Active Problem List   Diagnosis Date Noted  . OSA on CPAP 03/09/2016  . Hypersomnia 01/07/2016  . Exertional dyspnea 01/07/2016  . Back pain 08/01/2015  . Gastroesophageal reflux disease with esophagitis 07/17/2015  . Essential hypertension, benign 07/17/2015  . S/P arterial stent 04/29/2015  . Renal artery stenosis ([place]) 12/26/2012  . Hyperlipidemia LDL goal <100 10/11/2012  . Gout 10/11/2012  . Occlusion and stenosis of carotid artery without mention of cerebral infarction 10/12/2011    Rayetta Humphrey, PT CLT [phone] 12/25/2016, 3:19 PM  [facility] [address] [place], [place], [address] Phone: [phone]   Fax:  [phone]  Name: James [name] MRN: [id number] Date of Birth: 12-22-1945

## 2016-12-28 ENCOUNTER — Ambulatory Visit (HOSPITAL_COMMUNITY): Payer: Medicare Other | Admitting: Physical Therapy

## 2016-12-28 DIAGNOSIS — I890 Lymphedema, not elsewhere classified: Secondary | ICD-10-CM

## 2016-12-28 NOTE — Therapy (Signed)
[place], [place], [address] Phone: [phone]   Fax:  [phone]  Physical Therapy Treatment  Patient Details  Name: James [name] MRN: [id number] Date of Birth: 05-Jul-1946 Referring Provider: Harold Barban  Encounter Date: 12/28/2016      PT End of Session - 12/28/16 1607    Visit Number 3   Number of Visits 9   Date for PT Re-Evaluation 01/22/17   Authorization Type medicare   Authorization - Visit Number 3   Authorization - Number of Visits 9   PT Start Time 1350   PT Stop Time 1430   PT Time Calculation (min) 40 min   Activity Tolerance Patient tolerated treatment well   Behavior During Therapy [facility] for tasks assessed/performed      Past Medical History:  Diagnosis Date  . Arthritis   . Carotid artery occlusion   . DVT (deep venous thrombosis) ([place]) 2008  . DVT (deep venous thrombosis) ([place])   . GERD (gastroesophageal reflux disease)    takes Protonix daily  . Gout    takes Allopurinol daily and Indomethacin prn  . Gout flare   . H/O renal artery stenosis   . H/O: GI bleed   . History of blood transfusion   . History of colon polyps   . History of gastric ulcer   . Hyperlipidemia    takes Lipitor daily  . Hypertension    takes Metoprolol and Amlodipine daily  . Joint pain   . Joint swelling     Past Surgical History:  Procedure Laterality Date  . CAROTID ENDARTERECTOMY  2008   right CEA  . CHOLECYSTECTOMY  mid 90's  . COLON SURGERY    . COLONOSCOPY    . ENDARTERECTOMY  06/02/2012   Procedure: ENDARTERECTOMY CAROTID;  Surgeon: Serafina Mitchell, MD;  Location[facility] OR;  Service: Vascular;  Laterality: Left;  . ESOPHAGOGASTRODUODENOSCOPY    . HERNIA REPAIR     umbilical hernia  . PATCH ANGIOPLASTY  06/02/2012   Procedure: PATCH ANGIOPLASTY;  Surgeon: Serafina Mitchell, MD;  Location: [facility];  Service: Vascular;  Laterality: Left;  using Vascu-Guard Patch  . PERIPHERAL VASCULAR  CATHETERIZATION Right 12/03/2015   Procedure: Peripheral Vascular Balloon Angioplasty;  Surgeon: Serafina Mitchell, MD;  Location: [place] [facility];  Service: Cardiovascular;  Laterality: Right;  RENAL unsuccessful  . RENAL ARTERY STENT  2009   Right renal artery stenting-  Right Kidney  . small intestine removed  2001   d/t random gi bleeding    There were no vitals filed for this visit.      Subjective Assessment - 12/28/16 1610    Subjective Pt states that the profore bandages stayed on; states that he has been contacted by [facility] cares and a pump is being sent out to his home sometime this week.    Pertinent History Lt carotid endarectomy; Rt renal stent.  Pt has been checked for DVT and is negative.    Currently in Pain? No/denies               LYMPHEDEMA/ONCOLOGY QUESTIONNAIRE - 12/28/16 1359      Right Lower Extremity Lymphedema   At Midpatella/Popliteal Crease 40 cm   30 cm Proximal to Floor at Lateral Plantar Foot 34.8 cm   20 cm Proximal to Floor at Lateral Plantar Foot 26 1   10  cm Proximal to Floor at Lateral Malleoli 22.8 cm   Circumference of ankle/heel  32.7 cm.   5 cm Proximal to 1st MTP Joint 23 cm   Across MTP Joint 23.8 cm   Around Proximal Great Toe 9.1 cm     Left Lower Extremity Lymphedema   At Midpatella/Popliteal Crease 41 cm   30 cm Proximal to Floor at Lateral Plantar Foot 35.8 cm   20 cm Proximal to Floor at Lateral Plantar Foot 26 cm   10 cm Proximal to Floor at Lateral Malleoli 24 cm   Circumference of ankle/heel 34.2 cm.   5 cm Proximal to 1st MTP Joint 24.8 cm   Across MTP Joint 26.5 cm   Around Proximal Great Toe 10.2 cm                  [facility] Adult PT Treatment/Exercise - 12/28/16 0001      Manual Therapy   Manual Therapy Manual Lymphatic Drainage (MLD)   Manual therapy comments To include supra clavicular, deep and superfical abdominal, routing fluid using inguinal/axillary anastomsis both anteriorly and posteriorly .    Manual Lymphatic Drainage (MLD) done bilaterally                   PT Short Term Goals - 12/28/16 1609      PT SHORT TERM GOAL #1   Title Pt volume in Lt LE to be decreased by 2 cm to ease donning of shoes and socks.    Time 10   Period Days   Status Achieved     PT SHORT TERM GOAL #2   Title Pt to be able to verbalize signs and symptoms of cellulitis and to understand that he must seek medical attention immediately.   Time 10   Period Days   Status Achieved     PT SHORT TERM GOAL #3   Title Pt to understand that lymphedema  is chronic and  progressive in nature and that he will need to wear compression for the rest of his life.    Time 3   Period Weeks   Status Achieved           PT Long Term Goals - 12/28/16 1609      PT LONG TERM GOAL #1   Title Pt to have obtained and be able to don his compression garment    Time 3   Period Weeks   Status On-going     PT LONG TERM GOAL #2   Title Pt to have acquired and be using his compression pump   Time 3   Period Weeks   Status On-going               Plan - 12/28/16 1607    Clinical Impression Statement LE volumes measured with significant decrease of volume.  Lt dorsal hump still remains but significantly decreased.  Pt desires to go home and place his compression garment on rather than use profore.  Therapist agreeable.     Clinical Presentation Stable   Rehab Potential Good   PT Frequency 3x / week   PT Duration 3 weeks   PT Treatment/Interventions ADLs/Self Care Home Management;Manual lymph drainage;Manual techniques   PT Next Visit Plan Assess comfort of profore; Measure LE next week, check on order for compression garment       Patient will benefit from skilled therapeutic intervention in order to improve the following deficits and impairments:  Increased edema  Visit Diagnosis: Lymphedema, not elsewhere classified     Problem List Patient Active Problem List  Diagnosis Date Noted  .  OSA on CPAP 03/09/2016  . Hypersomnia 01/07/2016  . Exertional dyspnea 01/07/2016  . Back pain 08/01/2015  . Gastroesophageal reflux disease with esophagitis 07/17/2015  . Essential hypertension, benign 07/17/2015  . S/P arterial stent 04/29/2015  . Renal artery stenosis ([place]) 12/26/2012  . Hyperlipidemia LDL goal <100 10/11/2012  . Gout 10/11/2012  . Occlusion and stenosis of carotid artery without mention of cerebral infarction 10/12/2011    Rayetta Humphrey, PT CLT [phone] 12/28/2016, 4:11 PM  [place], [place], [address] Phone: [phone]   Fax:  [phone]  Name: RANALD ALESSIO MRN: [id number] Date of Birth: January 22, 1946

## 2016-12-30 ENCOUNTER — Ambulatory Visit (HOSPITAL_COMMUNITY): Payer: Medicare Other | Admitting: Physical Therapy

## 2016-12-30 DIAGNOSIS — I890 Lymphedema, not elsewhere classified: Secondary | ICD-10-CM | POA: Diagnosis not present

## 2016-12-30 NOTE — Therapy (Addendum)
[place], [place], [address] Phone: [phone]   Fax:  [phone]  Physical Therapy Treatment  Patient Details  Name: James [name] MRN: [id number] Date of Birth: [birth year] Referring Provider: Harold Barban  Encounter Date: 12/30/2016      PT End of Session - 12/30/16 1147    Visit Number 4   Number of Visits 9   Date for PT Re-Evaluation 01/22/17   Authorization Type medicare   Authorization - Visit Number 4   Authorization - Number of Visits 9   PT Start Time 1000   PT Stop Time 1040   PT Time Calculation (min) 40 min   Activity Tolerance Patient tolerated treatment well   Behavior During Therapy [facility] for tasks assessed/performed      Past Medical History:  Diagnosis Date  . Arthritis   . Carotid artery occlusion   . DVT (deep venous thrombosis) ([place]) 2008  . DVT (deep venous thrombosis) ([place])   . GERD (gastroesophageal reflux disease)    takes Protonix daily  . Gout    takes Allopurinol daily and Indomethacin prn  . Gout flare   . H/O renal artery stenosis   . H/O: GI bleed   . History of blood transfusion   . History of colon polyps   . History of gastric ulcer   . Hyperlipidemia    takes Lipitor daily  . Hypertension    takes Metoprolol and Amlodipine daily  . Joint pain   . Joint swelling     Past Surgical History:  Procedure Laterality Date  . CAROTID ENDARTERECTOMY  2008   right CEA  . CHOLECYSTECTOMY  mid 90's  . COLON SURGERY    . COLONOSCOPY    . ENDARTERECTOMY  06/02/2012   Procedure: ENDARTERECTOMY CAROTID;  Surgeon: Serafina Mitchell, MD;  [facility];  Service: Vascular;  Laterality: Left;  . ESOPHAGOGASTRODUODENOSCOPY    . HERNIA REPAIR     umbilical hernia  . PATCH ANGIOPLASTY  06/02/2012   Procedure: PATCH ANGIOPLASTY;  Surgeon: Serafina Mitchell, MD;  Location: [place];  Service: Vascular;  Laterality: Left;  using Vascu-Guard Patch  . PERIPHERAL VASCULAR  CATHETERIZATION Right 12/03/2015   Procedure: Peripheral Vascular Balloon Angioplasty;  Surgeon: Serafina Mitchell, MD;  Location: [place] CV LAB;  Service: Cardiovascular;  Laterality: Right;  RENAL unsuccessful  . RENAL ARTERY STENT  2009   Right renal artery stenting-  Right Kidney  . small intestine removed  2001   d/t random gi bleeding    There were no vitals filed for this visit.      Subjective Assessment - 12/30/16 1144    Subjective Pt comes to department with compression socks that he has purchased at [employer].  These only go half way up his leg and the compression is 15-20 which is not adequate.    Pertinent History Lt carotid endarectomy; Rt renal stent.  Pt has been checked for DVT and is negative.    Currently in Pain? No/denies                         [facility] Adult PT Treatment/Exercise - 12/30/16 0001      Manual Therapy   Manual Therapy Manual Lymphatic Drainage (MLD)   Manual therapy comments To include supra clavicular, deep and superfical abdominal, routing fluid using inguinal/axillary anastomsis both anteriorly and posteriorly .   Manual Lymphatic Drainage (MLD) done bilaterally  PT Short Term Goals - 12/28/16 1609      PT SHORT TERM GOAL #1   Title Pt volume in Lt LE to be decreased by 2 cm to ease donning of shoes and socks.    Time 10   Period Days   Status Achieved     PT SHORT TERM GOAL #2   Title Pt to be able to verbalize signs and symptoms of cellulitis and to understand that he must seek medical attention immediately.   Time 10   Period Days   Status Achieved     PT SHORT TERM GOAL #3   Title Pt to understand that lymphedema  is chronic and  progressive in nature and that he will need to wear compression for the rest of his life.    Time 3   Period Weeks   Status Achieved           PT Long Term Goals - 12/28/16 1609      PT LONG TERM GOAL #1   Title Pt to have obtained and be able to don  his compression garment    Time 3   Period Weeks   Status On-going     PT LONG TERM GOAL #2   Title Pt to have acquired and be using his compression pump   Time 3   Period Weeks   Status On-going               Plan - 12/30/16 1147    Clinical Impression Statement Explained to pt that the socks he has purchased are fine for individuals who spend a lot of time on there feet and need some extra support but they are not adequate for his diagnosis.  Pt was given prescription for compression hose as well as list of where to purchase garments.  Pt was going immediately to [place] to purchase correct garment therefore no compression bandaging was completed.     Clinical Presentation Stable   Rehab Potential Good   PT Frequency 3x / week   PT Duration 3 weeks   PT Treatment/Interventions ADLs/Self Care Home Management;Manual lymph drainage;Manual techniques   PT Next Visit Plan Volume to be measured; answer any questions and possible discharge.    Consulted and Agree with Plan of Care Patient      Patient will benefit from skilled therapeutic intervention in order to improve the following deficits and impairments:  Increased edema  Visit Diagnosis: Lymphedema, not elsewhere classified     Problem List Patient Active Problem List   Diagnosis Date Noted  . OSA on CPAP 03/09/2016  . Hypersomnia 01/07/2016  . Exertional dyspnea 01/07/2016  . Back pain 08/01/2015  . Gastroesophageal reflux disease with esophagitis 07/17/2015  . Essential hypertension, benign 07/17/2015  . S/P arterial stent 04/29/2015  . Renal artery stenosis ([place]) 12/26/2012  . Hyperlipidemia LDL goal <100 10/11/2012  . Gout 10/11/2012  . Occlusion and stenosis of carotid artery without mention of cerebral infarction 10/12/2011    Rayetta Humphrey, PT CLT [phone] 12/30/2016, 11:51 AM  [place] [address] [place], [place], [address] Phone:  [phone]   Fax:  [phone]  Name: James [name] MRN: [id number] Date of Birth: May 25, 1946

## 2017-01-01 ENCOUNTER — Encounter (HOSPITAL_COMMUNITY): Payer: Medicare Other | Admitting: Physical Therapy

## 2017-01-04 ENCOUNTER — Ambulatory Visit (HOSPITAL_COMMUNITY): Payer: Medicare Other | Admitting: Physical Therapy

## 2017-01-04 DIAGNOSIS — I890 Lymphedema, not elsewhere classified: Secondary | ICD-10-CM

## 2017-01-04 DIAGNOSIS — H2513 Age-related nuclear cataract, bilateral: Secondary | ICD-10-CM | POA: Diagnosis not present

## 2017-01-04 DIAGNOSIS — H52203 Unspecified astigmatism, bilateral: Secondary | ICD-10-CM | POA: Diagnosis not present

## 2017-01-04 NOTE — Therapy (Signed)
[place], [place], [address] Phone: [phone]   Fax:  [phone]  Physical Therapy Treatment/Discharge  Patient Details  Name: James [name] MRN: [id number] Date of Birth: 1946-07-27 Referring Provider: Harold Barban  Encounter Date: 01/04/2017      PT End of Session - 01/04/17 1101    Visit Number 5   Number of Visits 5   Date for PT Re-Evaluation 01/22/17   Authorization Type medicare   Authorization - Visit Number 5   Authorization - Number of Visits 5   PT Start Time 0950   PT Stop Time 1038   PT Time Calculation (min) 48 min   Activity Tolerance Patient tolerated treatment well   Behavior During Therapy [facility] for tasks assessed/performed      Past Medical History:  Diagnosis Date  . Arthritis   . Carotid artery occlusion   . DVT (deep venous thrombosis) ([place]) 2008  . DVT (deep venous thrombosis) ([place])   . GERD (gastroesophageal reflux disease)    takes Protonix daily  . Gout    takes Allopurinol daily and Indomethacin prn  . Gout flare   . H/O renal artery stenosis   . H/O: GI bleed   . History of blood transfusion   . History of colon polyps   . History of gastric ulcer   . Hyperlipidemia    takes Lipitor daily  . Hypertension    takes Metoprolol and Amlodipine daily  . Joint pain   . Joint swelling     Past Surgical History:  Procedure Laterality Date  . CAROTID ENDARTERECTOMY  2008   right CEA  . CHOLECYSTECTOMY  mid 90's  . COLON SURGERY    . COLONOSCOPY    . ENDARTERECTOMY  06/02/2012   Procedure: ENDARTERECTOMY CAROTID;  Surgeon: Serafina Mitchell, MD;  Location: [facility] OR;  Service: Vascular;  Laterality: Left;  . ESOPHAGOGASTRODUODENOSCOPY    . HERNIA REPAIR     umbilical hernia  . PATCH ANGIOPLASTY  06/02/2012   Procedure: PATCH ANGIOPLASTY;  Surgeon: Serafina Mitchell, MD;  Location: [place];  Service: Vascular;  Laterality: Left;  using Vascu-Guard Patch  . PERIPHERAL  VASCULAR CATHETERIZATION Right 12/03/2015   Procedure: Peripheral Vascular Balloon Angioplasty;  Surgeon: Serafina Mitchell, MD;  Location: [place] CV LAB;  Service: Cardiovascular;  Laterality: Right;  RENAL unsuccessful  . RENAL ARTERY STENT  2009   Right renal artery stenting-  Right Kidney  . small intestine removed  2001   d/t random gi bleeding    There were no vitals filed for this visit.      Subjective Assessment - 01/04/17 0950    Subjective Pt has obtained his compression garments and is able to don and doff without difficulty   Pertinent History Lt carotid endarectomy; Rt renal stent.  Pt has been checked for DVT and is negative.    Currently in Pain? No/denies            [facility] PT Assessment - 01/04/17 0001      Assessment   Medical Diagnosis Lt LE lymphedema   Referring Provider Harold Barban   Onset Date/Surgical Date 05/27/17   Next MD Visit unknown   Prior Therapy none      Precautions   Precautions None     Restrictions   Weight Bearing Restrictions No     Balance Screen   Has the patient fallen in the past 6 months No  Has the patient had a decrease in activity level because of a fear of falling?  No   Is the patient reluctant to leave their home because of a fear of falling?  No     Prior Function   Level of Independence Independent     Cognition   Overall Cognitive Status Within Functional Limits for tasks assessed     Observation/Other Assessments   Focus on Therapeutic Outcomes (FOTO)  --  Life impact 27           LYMPHEDEMA/ONCOLOGY QUESTIONNAIRE - 01/04/17 0951      Right Lower Extremity Lymphedema   At Midpatella/Popliteal Crease 38 cm  was 40.8   30 cm Proximal to Floor at Lateral Plantar Foot 36 cm  was 37   20 cm Proximal to Floor at Lateral Plantar Foot 27 1  was 29.5   10 cm Proximal to Floor at Lateral Malleoli 24.5 cm  was28   Circumference of ankle/heel 33.4 cm.  was 37   5 cm Proximal to 1st MTP Joint 24.3 cm   was 25.5   Across MTP Joint 25 cm  was 25.2   Around Proximal Great Toe 8.8 cm  was 9.4     Left Lower Extremity Lymphedema   At Midpatella/Popliteal Crease 39.3 cm  42   30 cm Proximal to Floor at Lateral Plantar Foot 37.7 cm  was 39   20 cm Proximal to Floor at Lateral Plantar Foot 28.8 cm  was 31.9   10 cm Proximal to Floor at Lateral Malleoli 26 cm  was 32   Circumference of ankle/heel 35 cm.  was 34.2   5 cm Proximal to 1st MTP Joint 25.5 cm   Across MTP Joint 26.5 cm  was 31.2   Around Proximal Great Toe 10 cm  was 10.2                  [facility] Adult PT Treatment/Exercise - 01/04/17 0001      Manual Therapy   Manual Therapy Manual Lymphatic Drainage (MLD)   Manual therapy comments To include supra clavicular, deep and superfical abdominal, routing fluid using inguinal/axillary anastomsis both anteriorly and posteriorly .   Manual Lymphatic Drainage (MLD) done bilaterally                 PT Education - 01/04/17 1057    Education provided Yes   Education Details If he has an exacerbation he can use his pump for an hour twice a day rather than once a day.  If this does not help he will need to come back in for more manual.  Garments will need to be replaced once every six months to keep the correct compression.    Person(s) Educated Patient   Methods Explanation   Comprehension Verbalized understanding          PT Short Term Goals - 01/04/17 1103      PT SHORT TERM GOAL #1   Title Pt volume in Lt LE to be decreased by 2 cm to ease donning of shoes and socks.    Time 10   Period Days   Status Achieved     PT SHORT TERM GOAL #2   Title Pt to be able to verbalize signs and symptoms of cellulitis and to understand that he must seek medical attention immediately.   Time 10   Period Days   Status Achieved     PT SHORT TERM GOAL #3   Title  Pt to understand that lymphedema  is chronic and  progressive in nature and that he will need to wear  compression for the rest of his life.    Time 3   Period Weeks   Status Achieved           PT Long Term Goals - 01/22/17 1104      PT LONG TERM GOAL #1   Title Pt to have obtained and be able to don his compression garment    Time 3   Period Weeks   Status Achieved     PT LONG TERM GOAL #2   Title Pt to have acquired and be using his compression pump   Time 3   Period Weeks   Status On-going               Plan - 01-22-17 1102    Clinical Impression Statement Pt has obtained compression stockings and is very happy with them.  Pt pleased with the size of his LE and his compression pump is to be delivered this week.  Pt has met all goals and is ready for discharge.    Rehab Potential Good   PT Frequency 3x / week   PT Duration 3 weeks   PT Treatment/Interventions ADLs/Self Care Home Management;Manual lymph drainage;Manual techniques   PT Next Visit Plan Discharge.    Consulted and Agree with Plan of Care Patient      Patient will benefit from skilled therapeutic intervention in order to improve the following deficits and impairments:  Increased edema  Visit Diagnosis: Lymphedema, not elsewhere classified       G-Codes - 01-22-2017 1105    Functional Assessment Tool Used (Outpatient Only) clinical judgement:  Pt edema, ability to don and doff shoes, ambulate    Functional Limitation Mobility: Walking and moving around   Mobility: Walking and Moving Around Goal Status [phone]) At least 1 percent but less than 20 percent impaired, limited or restricted   Mobility: Walking and Moving Around Discharge Status [phone]) At least 1 percent but less than 20 percent impaired, limited or restricted      Problem List Patient Active Problem List   Diagnosis Date Noted  . OSA on CPAP 03/09/2016  . Hypersomnia 01/07/2016  . Exertional dyspnea 01/07/2016  . Back pain 08/01/2015  . Gastroesophageal reflux disease with esophagitis 07/17/2015  . Essential hypertension, benign  07/17/2015  . S/P arterial stent 04/29/2015  . Renal artery stenosis ([place]) 12/26/2012  . Hyperlipidemia LDL goal <100 10/11/2012  . Gout 10/11/2012  . Occlusion and stenosis of carotid artery without mention of cerebral infarction 10/12/2011    Rayetta Humphrey, PT CLT [phone] 01-22-2017, 11:07 AM  [place], [place], [address] Phone: [phone]   Fax:  [phone]  Name: James [name] MRN: [id number] Date of Birth: [birth year]  PHYSICAL THERAPY DISCHARGE SUMMARY  Visits from Start of Care: 5  Current functional level related to goals / functional outcomes: See above   Remaining deficits: See above   Education / Equipment: RE lymphedema and how to control swelling  Plan: Patient agrees to discharge.  Patient goals were met. Patient is being discharged due to meeting the stated rehab goals.  ?????       Rayetta Humphrey, [place] CLT [phone]

## 2017-01-06 ENCOUNTER — Encounter (HOSPITAL_COMMUNITY): Payer: Medicare Other | Admitting: Physical Therapy

## 2017-01-08 ENCOUNTER — Encounter (HOSPITAL_COMMUNITY): Payer: Medicare Other | Admitting: Physical Therapy

## 2017-01-11 ENCOUNTER — Ambulatory Visit (HOSPITAL_COMMUNITY): Payer: Medicare Other | Admitting: Physical Therapy

## 2017-01-13 ENCOUNTER — Encounter (HOSPITAL_COMMUNITY): Payer: Medicare Other | Admitting: Physical Therapy

## 2017-01-15 ENCOUNTER — Encounter (HOSPITAL_COMMUNITY): Payer: Medicare Other | Admitting: Physical Therapy

## 2017-01-18 ENCOUNTER — Encounter (HOSPITAL_COMMUNITY): Payer: Medicare Other | Admitting: Physical Therapy

## 2017-01-20 ENCOUNTER — Encounter (HOSPITAL_COMMUNITY): Payer: Medicare Other | Admitting: Physical Therapy

## 2017-01-22 ENCOUNTER — Encounter (HOSPITAL_COMMUNITY): Payer: Medicare Other | Admitting: Physical Therapy

## 2017-01-25 ENCOUNTER — Encounter (HOSPITAL_COMMUNITY): Payer: Medicare Other | Admitting: Physical Therapy

## 2017-01-29 ENCOUNTER — Encounter (HOSPITAL_COMMUNITY): Payer: Medicare Other | Admitting: Physical Therapy

## 2017-02-14 ENCOUNTER — Other Ambulatory Visit: Payer: Self-pay | Admitting: Family Medicine

## 2017-02-14 DIAGNOSIS — I10 Essential (primary) hypertension: Secondary | ICD-10-CM

## 2017-02-16 NOTE — Telephone Encounter (Signed)
Last seen 06/11/16  Dr [name]   [place] PCP

## 2017-03-06 ENCOUNTER — Other Ambulatory Visit: Payer: Self-pay | Admitting: Family Medicine

## 2017-03-06 DIAGNOSIS — E785 Hyperlipidemia, unspecified: Secondary | ICD-10-CM

## 2017-03-06 DIAGNOSIS — I10 Essential (primary) hypertension: Secondary | ICD-10-CM

## 2017-03-08 NOTE — Telephone Encounter (Signed)
Please let patient know his appointment, go ahead and send him enough medication to get through that appointment.

## 2017-03-15 ENCOUNTER — Encounter: Payer: Self-pay | Admitting: Acute Care

## 2017-03-15 ENCOUNTER — Ambulatory Visit (INDEPENDENT_AMBULATORY_CARE_PROVIDER_SITE_OTHER): Payer: Medicare Other | Admitting: Acute Care

## 2017-03-15 DIAGNOSIS — Z9989 Dependence on other enabling machines and devices: Secondary | ICD-10-CM

## 2017-03-15 DIAGNOSIS — I701 Atherosclerosis of renal artery: Secondary | ICD-10-CM | POA: Diagnosis not present

## 2017-03-15 DIAGNOSIS — G4733 Obstructive sleep apnea (adult) (pediatric): Secondary | ICD-10-CM

## 2017-03-15 NOTE — Assessment & Plan Note (Signed)
Compliant with CPAP Therapy Benefiting from treatment as evidenced by AHI decrease to 0.9 Plan: Continue on CPAP at bedtime. You are  benefiting from the treatment Goal is to wear for at least 4-6 hours each night for maximal clinical benefit. Continue to work on weight loss, as the link between excess weight  and sleep apnea is well established.  Do not drive if sleepy. Clean tubing, mask, reservoir and filter once weekly with soapy water. Follow up with 12  monthsor before as needed with Dr. Halford [name] or NP.  Please contact office for sooner follow up if symptoms do not improve or worsen or seek emergency care

## 2017-03-15 NOTE — Progress Notes (Signed)
History of Present Illness James [name] is a 71 y.o. male with OSA on CPAP therapy initiated 01/2016. He was followed by Dr.De Larkin Ina.    03/15/2017 Follow up for CPAP: Pt. Presents for CPAP follow up. He states he is compliant every night with his device. He states he is sleeping better at night. He sleeps longer and does not wake up through the night as often. He sleeps more deeply.  He does not awaken with headache in the morning.He states he has decreased daytime sleepiness. He denies fever, chest pain, orthopnea or hemoptysis.He states he is having no issues with his machine or his mask.   Test Results: Down Load: 02/10/2017-03/11/2017 AieSense 10 AutoSet 5-15 cm H2O  Compliance = 30/30 days ( 100%) > 4 hours=  29 days or 97% < 4 hours = 1 day or 3% Median Pressure= 6.9 cm H2O AHI= 0.9    Original Study: 12/2015: AHI= 26  times an hour.    CBC Latest Ref Rng & Units 12/03/2015 06/12/2014 04/24/2013  WBC 4.6 - 10.2 K/uL - 7.5 9.2  Hemoglobin 13.0 - 17.0 g/dL 13.9 15.0 15.9  Hematocrit 39.0 - 52.0 % 41.0 46.3 46.0  Platelets 150 - 379 x10E3/uL - - 272    BMP Latest Ref Rng & Units 06/11/2016 03/11/2016 12/03/2015  Glucose 65 - 99 mg/dL 109(H) 100(H) 104(H)  BUN 8 - 27 mg/dL 14 16 13   Creatinine 0.76 - 1.27 mg/dL 1.37(H) 1.39(H) 1.40(H)  BUN/Creat Ratio 10 - 24 10 12  -  Sodium 134 - 144 mmol/L 141 140 140  Potassium 3.5 - 5.2 mmol/L 3.6 3.5 3.1(L)  Chloride 96 - 106 mmol/L 96 96 99(L)  CO2 18 - 29 mmol/L 26 26 -  Calcium 8.6 - 10.2 mg/dL 9.6 9.2 -    BNP No results found for: BNP  ProBNP No results found for: PROBNP  PFT No results found for: FEV1PRE, FEV1POST, FVCPRE, FVCPOST, TLC, DLCOUNC, PREFEV1FVCRT, PSTFEV1FVCRT  No results found.   Past medical hx Past Medical History:  Diagnosis Date  . Arthritis   . Carotid artery occlusion   . DVT (deep venous thrombosis) ([place]) 2008  . DVT (deep venous thrombosis) ([place])   . GERD (gastroesophageal reflux disease)      takes Protonix daily  . Gout    takes Allopurinol daily and Indomethacin prn  . Gout flare   . H/O renal artery stenosis   . H/O: GI bleed   . History of blood transfusion   . History of colon polyps   . History of gastric ulcer   . Hyperlipidemia    takes Lipitor daily  . Hypertension    takes Metoprolol and Amlodipine daily  . Joint pain   . Joint swelling      Social History  Substance Use Topics  . Smoking status: Former Smoker    Quit date: 01/02/2000  . Smokeless tobacco: Former Systems developer    Quit date: 12/26/1999  . Alcohol use No     Comment: Quit in [place] reports that he quit smoking about 17 years ago. He quit smokeless tobacco use about 17 years ago. He reports that he does not drink alcohol or use drugs.  Tobacco Cessation: Former smoker quit 2001  Past surgical hx, Family hx, Social hx all reviewed.  Current Outpatient Prescriptions on File Prior to Visit  Medication Sig  . acetaminophen (TYLENOL) 500 MG tablet Take 500 mg by mouth 2 (two) times daily as needed (  pain).  . allopurinol (ZYLOPRIM) 100 MG tablet Take 2 tablets (200 mg total) by mouth daily.  Marland Kitchen amLODipine (NORVASC) 10 MG tablet TAKE 1 TABLET DAILY AT SUPPER  . aspirin 81 MG tablet Take 81 mg by mouth 2 (two) times daily with a meal.   . atorvastatin (LIPITOR) 40 MG tablet TAKE 1 TABLET DAILY AT SUPPER  . metoprolol tartrate (LOPRESSOR) 50 MG tablet TAKE 1 TABLET WITH BREAKFAST  . pantoprazole (PROTONIX) 40 MG tablet TAKE 1 TABLET DAILY WITH BREAKFAST   No current facility-administered medications on file prior to visit.      Allergies  Allergen Reactions  . Plavix [Clopidogrel Bisulfate] Other (See Comments)    GI Bleed    Review Of Systems:  Constitutional:   No  weight loss, night sweats,  Fevers, chills, fatigue, or  lassitude.  HEENT:   No headaches,  Difficulty swallowing,  Tooth/dental problems, or  Sore throat,                No sneezing, itching, ear ache, nasal  congestion, post nasal drip,   CV:  No chest pain,  Orthopnea, PND, swelling in lower extremities, anasarca, dizziness, palpitations, syncope.   GI  No heartburn, indigestion, abdominal pain, nausea, vomiting, diarrhea, change in bowel habits, loss of appetite, bloody stools.   Resp: No shortness of breath with exertion or at rest.  No excess mucus, no productive cough,  No non-productive cough,  No coughing up of blood.  No change in color of mucus.  No wheezing.  No chest wall deformity  Skin: no rash or lesions.  GU: no dysuria, change in color of urine, no urgency or frequency.  No flank pain, no hematuria   MS:  No joint pain or swelling.  No decreased range of motion.  No back pain.  Psych:  No change in mood or affect. No depression or anxiety.  No memory loss.   Vital Signs BP 136/82 (BP Location: Left Arm, Patient Position: Sitting, Cuff Size: Normal)   Pulse 66   Ht 5\' 10"  (1.778 m)   Wt 180 lb 3.2 oz (81.7 kg)   SpO2 98%   BMI 25.86 kg/m    Physical Exam:  General- No distress,  A&Ox3, pleasant ENT: No sinus tenderness, TM clear, pale nasal mucosa, no oral exudate,no post nasal drip, no LAN Cardiac: S1, S2, regular rate and rhythm, no murmur Chest: No wheeze/ rales/ dullness; no accessory muscle use, no nasal flaring, no sternal retractions Abd.: Soft Non-tender, obese Ext: No clubbing cyanosis, edema Neuro:  normal strength Skin: No rashes, warm and dry Psych: normal mood and behavior   Assessment/Plan  OSA on CPAP Compliant with CPAP Therapy Benefiting from treatment as evidenced by AHI decrease to 0.9 Plan: Continue on CPAP at bedtime. You are  benefiting from the treatment Goal is to wear for at least 4-6 hours each night for maximal clinical benefit. Continue to work on weight loss, as the link between excess weight  and sleep apnea is well established.  Do not drive if sleepy. Clean tubing, mask, reservoir and filter once weekly with soapy  water. Follow up with 12  monthsor before as needed with Dr. Halford Chessman or NP.  Please contact office for sooner follow up if symptoms do not improve or worsen or seek emergency care      Magdalen Spatz, NP 03/15/2017  11:02 AM

## 2017-03-15 NOTE — Patient Instructions (Addendum)
It is nice to meet you today. Continue on CPAP at bedtime. You are  benefiting from the treatment Goal is to wear for at least 4-6 hours each night for maximal clinical benefit. Continue to work on weight loss, as the link between excess weight  and sleep apnea is well established.  Do not drive if sleepy. Clean tubing, mask, reservoir and filter once weekly with soapy water. Follow up with 12  monthsor before as needed with Dr. Halford Chessman or NP.  Please contact office for sooner follow up if symptoms do not improve or worsen or seek emergency care

## 2017-03-16 NOTE — Progress Notes (Signed)
I have reviewed and agree with assessment/plan.  Chesley Mires, MD [facility] Pulmonary/Critical Care 03/16/2017, 7:42 AM Pager:  [phone]

## 2017-06-07 ENCOUNTER — Encounter (HOSPITAL_COMMUNITY): Payer: Medicare Other

## 2017-06-07 ENCOUNTER — Ambulatory Visit: Payer: Medicare Other | Admitting: Family

## 2017-06-09 ENCOUNTER — Ambulatory Visit (INDEPENDENT_AMBULATORY_CARE_PROVIDER_SITE_OTHER): Payer: Medicare Other | Admitting: Family Medicine

## 2017-06-09 ENCOUNTER — Encounter: Payer: Self-pay | Admitting: Family Medicine

## 2017-06-09 ENCOUNTER — Ambulatory Visit (INDEPENDENT_AMBULATORY_CARE_PROVIDER_SITE_OTHER): Payer: Medicare Other

## 2017-06-09 VITALS — BP 196/86 | HR 78 | Temp 98.0°F | Ht 70.0 in | Wt 180.0 lb

## 2017-06-09 DIAGNOSIS — Z1211 Encounter for screening for malignant neoplasm of colon: Secondary | ICD-10-CM | POA: Diagnosis not present

## 2017-06-09 DIAGNOSIS — Z23 Encounter for immunization: Secondary | ICD-10-CM | POA: Diagnosis not present

## 2017-06-09 DIAGNOSIS — E785 Hyperlipidemia, unspecified: Secondary | ICD-10-CM | POA: Diagnosis not present

## 2017-06-09 DIAGNOSIS — K2100 Gastro-esophageal reflux disease with esophagitis, without bleeding: Secondary | ICD-10-CM

## 2017-06-09 DIAGNOSIS — Z87891 Personal history of nicotine dependence: Secondary | ICD-10-CM

## 2017-06-09 DIAGNOSIS — I10 Essential (primary) hypertension: Secondary | ICD-10-CM

## 2017-06-09 DIAGNOSIS — J40 Bronchitis, not specified as acute or chronic: Secondary | ICD-10-CM | POA: Diagnosis not present

## 2017-06-09 DIAGNOSIS — I701 Atherosclerosis of renal artery: Secondary | ICD-10-CM | POA: Diagnosis not present

## 2017-06-09 LAB — CMP14+EGFR
ALT: 14 IU/L (ref 0–44)
AST: 19 IU/L (ref 0–40)
Albumin/Globulin Ratio: 1.7 (ref 1.2–2.2)
Albumin: 4.6 g/dL (ref 3.5–4.8)
Alkaline Phosphatase: 200 IU/L — ABNORMAL HIGH (ref 39–117)
BUN/Creatinine Ratio: 13 (ref 10–24)
BUN: 23 mg/dL (ref 8–27)
Bilirubin Total: 0.8 mg/dL (ref 0.0–1.2)
CO2: 24 mmol/L (ref 20–29)
Calcium: 9.8 mg/dL (ref 8.6–10.2)
Chloride: 97 mmol/L (ref 96–106)
Creatinine, Ser: 1.76 mg/dL — ABNORMAL HIGH (ref 0.76–1.27)
GFR calc Af Amer: 44 mL/min/{1.73_m2} — ABNORMAL LOW (ref >59)
GFR calc non Af Amer: 38 mL/min/{1.73_m2} — ABNORMAL LOW (ref >59)
Globulin, Total: 2.7 g/dL (ref 1.5–4.5)
Glucose: 113 mg/dL — ABNORMAL HIGH (ref 65–99)
Potassium: 3.4 mmol/L — ABNORMAL LOW (ref 3.5–5.2)
Sodium: 140 mmol/L (ref 134–144)
Total Protein: 7.3 g/dL (ref 6.0–8.5)

## 2017-06-09 LAB — CBC WITH DIFFERENTIAL/PLATELET
Basophils Absolute: 0 10*3/uL (ref 0.0–0.2)
Basos: 0 %
EOS (ABSOLUTE): 0.1 10*3/uL (ref 0.0–0.4)
Eos: 1 %
Hematocrit: 43 % (ref 37.5–51.0)
Hemoglobin: 14.6 g/dL (ref 13.0–17.7)
Immature Grans (Abs): 0 10*3/uL (ref 0.0–0.1)
Immature Granulocytes: 0 %
Lymphocytes Absolute: 1.3 10*3/uL (ref 0.7–3.1)
Lymphs: 14 %
MCH: 28.1 pg (ref 26.6–33.0)
MCHC: 34 g/dL (ref 31.5–35.7)
MCV: 83 fL (ref 79–97)
Monocytes Absolute: 0.6 10*3/uL (ref 0.1–0.9)
Monocytes: 7 %
Neutrophils Absolute: 6.8 10*3/uL (ref 1.4–7.0)
Neutrophils: 78 %
Platelets: 254 10*3/uL (ref 150–379)
RBC: 5.2 x10E6/uL (ref 4.14–5.80)
RDW: 14.7 % (ref 12.3–15.4)
WBC: 8.8 10*3/uL (ref 3.4–10.8)

## 2017-06-09 LAB — LIPID PANEL
Chol/HDL Ratio: 2.5 ratio (ref 0.0–5.0)
Cholesterol, Total: 159 mg/dL (ref 100–199)
HDL: 63 mg/dL (ref >39)
LDL Calculated: 71 mg/dL (ref 0–99)
Triglycerides: 127 mg/dL (ref 0–149)
VLDL Cholesterol Cal: 25 mg/dL (ref 5–40)

## 2017-06-09 MED ORDER — METOPROLOL TARTRATE 75 MG PO TABS: 75.0000 mg | ORAL_TABLET | Freq: Two times a day (BID) | ORAL | 1 refills | Status: DC

## 2017-06-09 MED ORDER — ATORVASTATIN CALCIUM 40 MG PO TABS: 40.0000 mg | ORAL_TABLET | Freq: Every day | ORAL | 1 refills | Status: DC

## 2017-06-09 MED ORDER — CLONIDINE HCL 0.1 MG PO TABS: 0.1000 mg | ORAL_TABLET | Freq: Three times a day (TID) | ORAL | 1 refills | Status: DC

## 2017-06-09 MED ORDER — AMLODIPINE BESYLATE 10 MG PO TABS: 10.0000 mg | ORAL_TABLET | Freq: Every day | ORAL | 1 refills | Status: DC

## 2017-06-09 MED ORDER — PANTOPRAZOLE SODIUM 40 MG PO TBEC: 40.0000 mg | DELAYED_RELEASE_TABLET | Freq: Every day | ORAL | 1 refills | Status: DC

## 2017-06-09 MED ORDER — ALLOPURINOL 100 MG PO TABS: 200.0000 mg | ORAL_TABLET | Freq: Every day | ORAL | 3 refills | Status: DC

## 2017-06-09 NOTE — Progress Notes (Signed)
BP (!) 196/86   Pulse 78   Temp 98 F (36.7 C) (Oral)   Ht 5' 10"  (1.778 m)   Wt 180 lb (81.6 kg)   BMI 25.83 kg/m    Subjective:    Patient ID: FRANCISCA HARBUCK, male    DOB: 1946/04/05, 71 y.o.   MRN: [id number]  HPI: STEVIN BIELINSKI is a 71 y.o. male presenting on 06/09/2017 for Hyperlipidemia (patient is fasting) and Hypertension   HPI Hyperlipidemia Patient is coming in for recheck of his hyperlipidemia. The patient is currently taking Lipitor. They deny any issues with myalgias or history of liver damage from it. They deny any focal numbness or weakness or chest pain.   GERD Patient is currently on pantoprazole.  She denies any major symptoms or abdominal pain or belching or burping. She denies any blood in her stool or lightheadedness or dizziness.   Hypertension Patient is currently on amlodipine and metoprolol, and their blood pressure today is 196/86, he says at home it typically runs in the 170s over 80s. Patient denies any lightheadedness or dizziness. Patient denies headaches, blurred vision, chest pains, shortness of breath, or weakness. Denies any side effects from medication and is content with current medication.  Patient does admit to significant smoking history and would like a chest x-ray for screening and ruling out purposes.  He does admit that he gets headaches sometimes when he feels like his blood pressure is up  Relevant past medical, surgical, family and social history reviewed and updated as indicated. Interim medical history since our last visit reviewed. Allergies and medications reviewed and updated.  Review of Systems  Constitutional: Negative for chills and fever.  Eyes: Negative for discharge.  Respiratory: Negative for shortness of breath and wheezing.   Cardiovascular: Negative for chest pain and leg swelling.  Musculoskeletal: Negative for back pain and gait problem.  Skin: Negative for rash.  Neurological: Positive for headaches.  Negative for dizziness, speech difficulty, weakness and numbness.  All other systems reviewed and are negative.   Per HPI unless specifically indicated above        Objective:    BP (!) 196/86   Pulse 78   Temp 98 F (36.7 C) (Oral)   Ht 5' 10"  (1.778 m)   Wt 180 lb (81.6 kg)   BMI 25.83 kg/m   Wt Readings from Last 3 Encounters:  06/09/17 180 lb (81.6 kg)  03/15/17 180 lb 3.2 oz (81.7 kg)  12/07/16 184 lb (83.5 kg)    Physical Exam  Constitutional: He is oriented to person, place, and time. He appears well-developed and well-nourished. No distress.  Eyes: Conjunctivae are normal. No scleral icterus.  Neck: Neck supple. No thyromegaly present.  Cardiovascular: Normal rate, regular rhythm, normal heart sounds and intact distal pulses.  No murmur heard. Pulmonary/Chest: Effort normal and breath sounds normal. No respiratory distress. He has no wheezes. He has no rales.  Abdominal: Soft. Bowel sounds are normal. He exhibits no distension. There is no tenderness.  Musculoskeletal: Normal range of motion. He exhibits no edema.  Lymphadenopathy:    He has no cervical adenopathy.  Neurological: He is alert and oriented to person, place, and time. Coordination normal.  Skin: Skin is warm and dry. No rash noted. He is not diaphoretic.  Psychiatric: He has a normal mood and affect. His behavior is normal.  Nursing note and vitals reviewed.       Assessment & Plan:   Problem List Items Addressed This  Visit      Cardiovascular and Mediastinum   Essential hypertension, benign   Relevant Medications   amLODipine (NORVASC) 10 MG tablet   atorvastatin (LIPITOR) 40 MG tablet   metoprolol tartrate 75 MG TABS   cloNIDine (CATAPRES) 0.1 MG tablet   Other Relevant Orders   CMP14+EGFR (Completed)   Microalbumin / creatinine urine ratio (Completed)     Digestive   Gastroesophageal reflux disease with esophagitis   Relevant Orders   CBC with Differential/Platelet (Completed)       Other   Hyperlipidemia LDL goal <100 - Primary   Relevant Medications   amLODipine (NORVASC) 10 MG tablet   atorvastatin (LIPITOR) 40 MG tablet   metoprolol tartrate 75 MG TABS   cloNIDine (CATAPRES) 0.1 MG tablet   Other Relevant Orders   Lipid panel (Completed)    Other Visit Diagnoses    Smoking history       Relevant Orders   DG Chest 2 View (Completed)   Colon cancer screening       Relevant Orders   Cologuard   Ambulatory referral to Gastroenterology   Need for immunization against influenza       Relevant Orders   Flu Vaccine QUAD 36+ mos IM (Completed)    Will start clonidine in addition to his current blood pressure medication and increase metoprolol to 75 twice a day  Follow up plan: Return if symptoms worsen or fail to improve.  Counseling provided for all of the vaccine components Orders Placed This Encounter  Procedures  . DG Chest 2 View  . CMP14+EGFR  . Lipid panel  . CBC with Differential/Platelet  . Microalbumin / creatinine urine ratio  . Cologuard    Caryl Pina, MD [facility] 06/09/2017, 9:23 AM

## 2017-06-10 ENCOUNTER — Telehealth: Payer: Self-pay | Admitting: Family Medicine

## 2017-06-10 DIAGNOSIS — Z1211 Encounter for screening for malignant neoplasm of colon: Secondary | ICD-10-CM

## 2017-06-10 LAB — MICROALBUMIN / CREATININE URINE RATIO
Creatinine, Urine: 140.9 mg/dL
Microalb/Creat Ratio: 1857.2 mg/g creat — ABNORMAL HIGH (ref 0.0–30.0)
Microalbumin, Urine: 2616.8 ug/mL

## 2017-06-10 MED ORDER — METOPROLOL TARTRATE 50 MG PO TABS: 75.0000 mg | ORAL_TABLET | Freq: Two times a day (BID) | ORAL | 3 refills | Status: DC

## 2017-06-10 NOTE — Telephone Encounter (Signed)
Metoprolol 75 BID was not available with mail order === med changed to 50 mg -- 1.5 tabs BID   Pt aware

## 2017-06-11 ENCOUNTER — Encounter (INDEPENDENT_AMBULATORY_CARE_PROVIDER_SITE_OTHER): Payer: Self-pay | Admitting: *Deleted

## 2017-06-11 NOTE — Telephone Encounter (Signed)
Per pt he is a pt of Dr Cristina Gong Wants to continue with him Please schedule with [facility]

## 2017-06-14 ENCOUNTER — Telehealth: Payer: Self-pay | Admitting: Family Medicine

## 2017-06-14 MED ORDER — METOPROLOL TARTRATE 50 MG PO TABS: 75.0000 mg | ORAL_TABLET | Freq: Two times a day (BID) | ORAL | 3 refills | Status: DC

## 2017-06-14 NOTE — Telephone Encounter (Signed)
New Rx sent to [facility].

## 2017-06-23 ENCOUNTER — Other Ambulatory Visit: Payer: Self-pay

## 2017-06-23 ENCOUNTER — Ambulatory Visit (INDEPENDENT_AMBULATORY_CARE_PROVIDER_SITE_OTHER): Payer: Medicare Other | Admitting: *Deleted

## 2017-06-23 VITALS — BP 176/74 | HR 53 | Ht 70.0 in | Wt 188.0 lb

## 2017-06-23 DIAGNOSIS — Z0000 Encounter for general adult medical examination without abnormal findings: Secondary | ICD-10-CM

## 2017-06-23 NOTE — Progress Notes (Signed)
Subjective:   James [name] is a 71 y.o. male who presents for an Initial Medicare Annual Wellness Visit. Patient lives in at home with his wife. They have 2 adult sons.  He enjoys working on old cars, trucks, tractors, and guns. He is retired from the [employer] and [employer]. He states his diet is semi-healthy.   Review of Systems  Patient states that his health is about the same as last year.  Cardiac Risk Factors include: advanced age (>22men, >22 women);hypertension;male gender;dyslipidemia    Objective:    Today's Vitals   06/23/17 0939 06/23/17 0940  BP: (!) 160/73 (!) 176/74  Pulse: (!) 53 (!) 53  Weight: 188 lb (85.3 kg)   Height: 5\' 10"  (1.778 m)    Body mass index is 26.98 kg/m.  Current Medications (verified) Outpatient Encounter Medications as of 06/23/2017  Medication Sig  . acetaminophen (TYLENOL) 500 MG tablet Take 500 mg by mouth 2 (two) times daily as needed (pain).  Marland Kitchen allopurinol (ZYLOPRIM) 100 MG tablet Take 2 tablets (200 mg total) daily by mouth.  Marland Kitchen amLODipine (NORVASC) 10 MG tablet Take 1 tablet (10 mg total) daily by mouth.  Marland Kitchen aspirin 81 MG tablet Take 81 mg by mouth 2 (two) times daily with a meal.   . atorvastatin (LIPITOR) 40 MG tablet Take 1 tablet (40 mg total) daily at 6 PM by mouth.  . cloNIDine (CATAPRES) 0.1 MG tablet Take 1 tablet (0.1 mg total) 3 (three) times daily by mouth.  . metoprolol tartrate (LOPRESSOR) 50 MG tablet Take 1.5 tablets (75 mg total) 2 (two) times daily by mouth.  . pantoprazole (PROTONIX) 40 MG tablet Take 1 tablet (40 mg total) daily with breakfast by mouth.   No facility-administered encounter medications on file as of 06/23/2017.     Allergies (verified) Plavix [clopidogrel bisulfate]   History: Past Medical History:  Diagnosis Date  . Arthritis   . Carotid artery occlusion   . DVT (deep venous thrombosis) ([place]) 2008  . DVT (deep venous thrombosis) ([place])   . GERD (gastroesophageal reflux disease)     takes Protonix daily  . Gout    takes Allopurinol daily and Indomethacin prn  . Gout flare   . H/O renal artery stenosis   . H/O: GI bleed   . History of blood transfusion   . History of colon polyps   . History of gastric ulcer   . Hyperlipidemia    takes Lipitor daily  . Hypertension    takes Metoprolol and Amlodipine daily  . Joint pain   . Joint swelling    Past Surgical History:  Procedure Laterality Date  . CAROTID ENDARTERECTOMY  2008   right CEA  . CHOLECYSTECTOMY  mid 90's  . COLON SURGERY    . COLONOSCOPY    . ENDARTERECTOMY  06/02/2012   Procedure: ENDARTERECTOMY CAROTID;  Surgeon: Serafina Mitchell, MD;  Location: [facility] OR;  Service: Vascular;  Laterality: Left;  . ESOPHAGOGASTRODUODENOSCOPY    . HERNIA REPAIR     umbilical hernia  . PATCH ANGIOPLASTY  06/02/2012   Procedure: PATCH ANGIOPLASTY;  Surgeon: Serafina Mitchell, MD;  Location: [facility];  Service: Vascular;  Laterality: Left;  using Vascu-Guard Patch  . PERIPHERAL VASCULAR CATHETERIZATION Right 12/03/2015   Procedure: Peripheral Vascular Balloon Angioplasty;  Surgeon: Serafina Mitchell, MD;  Location: [facility];  Service: Cardiovascular;  Laterality: Right;  RENAL unsuccessful  . RENAL ARTERY STENT  2009   Right  renal artery stenting-  Right Kidney  . small intestine removed  2001   d/t random gi bleeding   Family History  Problem Relation Age of Onset  . Hypertension Mother   . Hyperlipidemia Mother   . Other Mother        varicose veins  . Hyperlipidemia Father   . Hypertension Father   . Diabetes Father   . Clotting disorder Father   . Deep vein thrombosis Father   . Hypertension Sister   . Diabetes Sister   . Hyperlipidemia Sister   . Varicose Veins Sister   . Other Sister        Bleeding problems  . Hypertension Brother   . Hyperlipidemia Brother    Social History   Occupational History  . Not on file  Tobacco Use  . Smoking status: Former Smoker    Last attempt to quit: 01/02/2000      Years since quitting: 17.4  . Smokeless tobacco: Former Systems developer    Quit date: 12/26/1999  Substance and Sexual Activity  . Alcohol use: No    Comment: Quit in 1998  . Drug use: No  . Sexual activity: Yes   Tobacco Counseling Counseling given: Not Answered Patient quit smoking on 01/02/2000  Activities of Daily Living In your present state of health, do you have any difficulty performing the following activities: 06/23/2017  Hearing? N  Vision? N  Comment Does wear glasses   Difficulty concentrating or making decisions? N  Walking or climbing stairs? N  Dressing or bathing? N  Doing errands, shopping? N  Preparing Food and eating ? N  Using the Toilet? N  In the past six months, have you accidently leaked urine? N  Do you have problems with loss of bowel control? N  Managing your Medications? N  Managing your Finances? N  Housekeeping or managing your Housekeeping? N  Some recent data might be hidden   Patient has no concerns with his hearing and vision. Patient does wear glasses.  Immunizations and Health Maintenance Immunization History  Administered Date(s) Administered  . Influenza,inj,Quad PF,6+ Mos 06/12/2014, 06/14/2015, 06/09/2017  . Pneumococcal Conjugate-13 06/14/2015  . Pneumococcal Polysaccharide-23 06/09/2017   Health Maintenance Due  Topic Date Due  . COLONOSCOPY  06/12/2016  Patient has a consult appointment scheduled for 06/29/2017 for a colonoscopy.   Patient Care Team: Chipper Herb, MD as PCP - General (Family Medicine) [name], [name], MD as Consulting Physician (Family Medicine) Serafina Mitchell, MD as Consulting Physician (Vascular Surgery) Luberta Mutter, MD as Consulting Physician (Ophthalmology)  Indicate any recent Medical Services you may have received from other than [facility] providers in the past year (date may be approximate).    Assessment:   This is a routine wellness examination for James [name].   Hearing/Vision screen No exam data  present Patient has no concerns with his hearing or vision  Dietary issues and exercise activities discussed: Current Exercise Habits: Home exercise routine, Type of exercise: walking;Other - see comments(chair exercises), Time (Minutes): 20, Frequency (Times/Week): 5, Weekly Exercise (Minutes/Week): 100, Intensity: Mild, Exercise limited by: None identified  Goals    . Exercise 3x per week (30 min per time)      Depression Screen PHQ 2/9 Scores 06/23/2017 06/09/2017 06/11/2016 03/11/2016  PHQ - 2 Score 0 0 1 0  PHQ- 9 Score - - - -    Fall Risk Fall Risk  06/23/2017 06/09/2017 06/11/2016 03/11/2016 11/04/2015  Falls in the past year? No No No  No No    Cognitive Function: MMSE - Mini Mental State Exam 06/23/2017  Orientation to time 4  Orientation to Place 5  Registration 3  Attention/ Calculation 5  Recall 2  Language- name 2 objects 2  Language- repeat 1  Language- follow 3 step command 3  Language- read & follow direction 1  Write a sentence 1  Copy design 1  Total score 28   Patient scored a 28 out of 30       Screening Tests Health Maintenance  Topic Date Due  . COLONOSCOPY  06/12/2016  . TETANUS/TDAP  04/21/2021  . INFLUENZA VACCINE  Completed  . Hepatitis C Screening  Completed  . PNA vac Low Risk Adult  Completed   Patient has appointment scheduled for colonoscopy consult on 12/4 with Buccini     Plan:   Please keep appointment for colonoscopy consult on 06/29/17. Please keep appointment with Dr. Warrick Parisian for 07/23/17 Continue to wear your compression stockings. Continue to monitor BP and make sure you take your medicine daily.  I have personally reviewed and noted the following in the patient's chart:   . Medical and social history . Use of alcohol, tobacco or illicit drugs  . Current medications and supplements . Functional ability and status . Nutritional status . Physical activity . Advanced directives . List of other  physicians . Hospitalizations, surgeries, and ER visits in previous 12 months . Vitals . Screenings to include cognitive, depression, and falls . Referrals and appointments  In addition, I have reviewed and discussed with patient certain preventive protocols, quality metrics, and best practice recommendations. A written personalized care plan for preventive services as well as general preventive health recommendations were provided to patient.     Gareth Morgan, LPN   38/04/1750         I have reviewed and agree with the above AWV documentation.  Claretta Fraise, M.D.

## 2017-06-23 NOTE — Patient Instructions (Addendum)
James [name] , Thank you for taking time to come for your Medicare Wellness Visit. I appreciate your ongoing commitment to your health goals. Please review the following plan we discussed and let me know if I can assist you in the future.   These are the goals we discussed: Goals    . Exercise 3x per week (30 min per time)       This is a list of the screening recommended for you and due dates:  Health Maintenance  Topic Date Due  . Colon Cancer Screening  06/12/2016  . Tetanus Vaccine  04/21/2021  . Flu Shot  Completed  .  Hepatitis C: One time screening is recommended by Center for Disease Control  (CDC) for  adults born from [birth year] through 1965.   Completed  . Pneumonia vaccines  Completed   Please keep appointment with GI about scheduling colonoscopy Please think about Shingrix vaccine  Live Zoster (Shingles) Vaccine, ZVL: What You Need to Know 1. What is shingles? Shingles (also called herpes zoster, or just zoster) is a painful skin rash, often with blisters. Shingles is caused by the varicella zoster virus, the same virus that causes chickenpox. After you have chickenpox, the virus stays in your body and can cause shingles later in life. You can't catch shingles from another person. However, a person who has never had chickenpox (or chickenpox vaccine) could get chickenpox from someone with shingles. A shingles rash usually appears on one side of the face or body and heals within 2 to 4 weeks. Its main symptom is pain, which can be severe. Other symptoms can include fever, headache, chills, and upset stomach. Very rarely, a shingles infection can lead to pneumonia, hearing problems, blindness, brain inflammation (encephalitis), or death. For about 1 person in 5, severe pain can continue even long after the rash has cleared up. This long-lasting pain is called post-herpetic neuralgia (PHN). Shingles is far more common in people 106 years of age and older than in younger people, and  the risk increases with age. It is also more common in people whose immune system is weakened because of a disease such as cancer or by drugs such as steroids or chemotherapy. At least 1 million people a year in the [place] get shingles. 2. Shingles vaccine (live) A live shingles vaccine was approved by FDA in 2006. In a clinical trial, the vaccine reduced the risk of shingles by about 50% in people 60 and older. It can reduce the likelihood of PHN, and reduce pain in some people who still get shingles after being vaccinated. The recommended schedule for live shingles vaccine is a single dose for adults 54 years of age and older. 3. Some people should not get this vaccine Tell your vaccine provider if you:  Have any severe, life-threatening allergies. A person who has ever had a life-threatening allergic reaction after a dose of live shingles vaccine, or has a severe allergy to any component of this vaccine, may be advised not to be vaccinated. Ask your health care provider if you want information about vaccine components.  Are pregnant, or think you might be pregnant. Pregnant women should wait to get live shingles vaccine until they are no longer pregnant. Women should avoid getting pregnant for at least 1 month after getting shingles vaccine.  Have a weakened immune system due to disease (such as cancer or AIDS) or medical treatments (such as radiation, immunotherapy, high-dose steroids, or chemotherapy).  Are not feeling well. If you  have a mild illness, such as a cold, you can probably get the vaccine today. If you are moderately or severely ill, you should probably wait until you recover. Your doctor can advise you.  4. Risks of a vaccine reaction With any medicine, including vaccines, there is a chance of reactions. After live shingles vaccination, a person might experience:  Redness, soreness, swelling, or itching at the site of the injection  Headache  These events are usually  mild and go away on their own. Rarely, live shingles vaccine can cause rash or shingles. Other things that could happen after this vaccine:  People sometimes faint after medical procedures, including vaccination. Sitting or lying down for about 15 minutes can help prevent fainting and injuries caused by a fall. Tell your provider if you feel dizzy or have vision changes or ringing in the ears.  Some people get shoulder pain that can be more severe and longer-lasting than routine soreness that can follow injections. This happens very rarely.  Any medication can cause a severe allergic reaction. Such reactions to a vaccine are estimated at about 1 in a million doses, and would happen within a few minutes to a few hours after the vaccination. As with any medicine, there is a very remote chance of a vaccine causing a serious injury or death. The safety of vaccines is always being monitored. For more information, visit: http://www.aguilar.org/ 5. What if there is a serious problem? What should I look for?  Look for anything that concerns you, such as signs of a severe allergic reaction, very high fever, or unusual behavior. Signs of a severe allergic reaction can include hives, swelling of the face and throat, difficulty breathing, a fast heartbeat, dizziness, and weakness. These would usually start a few minutes to a few hours after the vaccination. What should I do?  If you think it is a severe allergic reaction or other emergency that can't wait, call 9-1-1 and get to the nearest hospital. Otherwise, call your health care provider.  Afterward, the reaction should be reported to the Vaccine Adverse Event Reporting System (VAERS). Your doctor should file this report, or you can do it yourself through the VAERS web site at www.vaers.SamedayNews.es or by calling 612 852 0309. VAERS does not give medical advice. 6. How can I learn more?  Ask your healthcare provider. He or she can give you the vaccine  package insert or suggest other sources of information.  Call your local or state health department.  Contact the Centers for Disease Control and Prevention (CDC): ? Call 678-131-9249(1-800-CDC-INFO) or ? Visit CDC's website at http://hunter.com/ [place] (VIS) Live Zoster Vaccine (09/07/2016) This information is not intended to replace advice given to you by your health care provider. Make sure you discuss any questions you have with your health care provider. Document Released: 05/10/2006 Document Revised: 09/22/2016 Document Reviewed: 09/22/2016 Elsevier Interactive Patient Education  [employer].

## 2017-07-01 ENCOUNTER — Other Ambulatory Visit: Payer: Self-pay

## 2017-07-09 DIAGNOSIS — Z1211 Encounter for screening for malignant neoplasm of colon: Secondary | ICD-10-CM | POA: Diagnosis not present

## 2017-07-09 LAB — HM COLONOSCOPY

## 2017-07-23 ENCOUNTER — Telehealth: Payer: Self-pay | Admitting: Family Medicine

## 2017-07-23 ENCOUNTER — Encounter: Payer: Self-pay | Admitting: *Deleted

## 2017-07-23 ENCOUNTER — Ambulatory Visit (INDEPENDENT_AMBULATORY_CARE_PROVIDER_SITE_OTHER): Payer: Medicare Other | Admitting: Family Medicine

## 2017-07-23 ENCOUNTER — Encounter: Payer: Self-pay | Admitting: Family Medicine

## 2017-07-23 VITALS — BP 205/82 | HR 59 | Temp 97.3°F | Ht 70.0 in | Wt 184.0 lb

## 2017-07-23 DIAGNOSIS — I701 Atherosclerosis of renal artery: Secondary | ICD-10-CM

## 2017-07-23 DIAGNOSIS — E785 Hyperlipidemia, unspecified: Secondary | ICD-10-CM | POA: Diagnosis not present

## 2017-07-23 DIAGNOSIS — K2100 Gastro-esophageal reflux disease with esophagitis, without bleeding: Secondary | ICD-10-CM

## 2017-07-23 DIAGNOSIS — I10 Essential (primary) hypertension: Secondary | ICD-10-CM | POA: Diagnosis not present

## 2017-07-23 LAB — CMP14+EGFR
ALT: 19 IU/L (ref 0–44)
AST: 26 IU/L (ref 0–40)
Albumin/Globulin Ratio: 1.8 (ref 1.2–2.2)
Albumin: 4.2 g/dL (ref 3.5–4.8)
Alkaline Phosphatase: 170 IU/L — ABNORMAL HIGH (ref 39–117)
BUN/Creatinine Ratio: 11 (ref 10–24)
BUN: 19 mg/dL (ref 8–27)
Bilirubin Total: 0.8 mg/dL (ref 0.0–1.2)
CO2: 23 mmol/L (ref 20–29)
Calcium: 9.4 mg/dL (ref 8.6–10.2)
Chloride: 100 mmol/L (ref 96–106)
Creatinine, Ser: 1.68 mg/dL — ABNORMAL HIGH (ref 0.76–1.27)
GFR calc Af Amer: 47 mL/min/{1.73_m2} — ABNORMAL LOW (ref >59)
GFR calc non Af Amer: 40 mL/min/{1.73_m2} — ABNORMAL LOW (ref >59)
Globulin, Total: 2.4 g/dL (ref 1.5–4.5)
Glucose: 95 mg/dL (ref 65–99)
Potassium: 3.7 mmol/L (ref 3.5–5.2)
Sodium: 141 mmol/L (ref 134–144)
Total Protein: 6.6 g/dL (ref 6.0–8.5)

## 2017-07-23 LAB — LIPID PANEL
Chol/HDL Ratio: 2.5 ratio (ref 0.0–5.0)
Cholesterol, Total: 158 mg/dL (ref 100–199)
HDL: 63 mg/dL (ref >39)
LDL Calculated: 75 mg/dL (ref 0–99)
Triglycerides: 102 mg/dL (ref 0–149)
VLDL Cholesterol Cal: 20 mg/dL (ref 5–40)

## 2017-07-23 NOTE — Progress Notes (Signed)
BP (!) 205/82   Pulse (!) 59   Temp (!) 97.3 F (36.3 C) (Oral)   Ht 5' 10"  (1.778 m)   Wt 184 lb (83.5 kg)   BMI 26.40 kg/m    Subjective:    Patient ID: James [name], male    DOB: 26-Dec-1945, 71 y.o.   MRN: [id number]  HPI: James [name] is a 71 y.o. male presenting on 07/23/2017 for Hypertension (1 mo follow up)   HPI Hypertension Patient is currently on amlodipine and clonidine and metoprolol, and their blood pressure today is 199/82 but patient does admit that he did not take his blood pressure pills today. Patient denies any lightheadedness or dizziness. Patient denies headaches, blurred vision, chest pains, shortness of breath, or weakness. Denies any side effects from medication and is content with current medication.   Hyperlipidemia Patient is coming in for recheck of his hyperlipidemia. The patient is currently taking Lipitor. They deny any issues with myalgias or history of liver damage from it. They deny any focal numbness or weakness or chest pain.   GERD Patient is currently on Protonix.  She denies any major symptoms or abdominal pain or belching or burping. She denies any blood in her stool or lightheadedness or dizziness.   Relevant past medical, surgical, family and social history reviewed and updated as indicated. Interim medical history since our last visit reviewed. Allergies and medications reviewed and updated.  Review of Systems  Constitutional: Negative for chills and fever.  Eyes: Negative for discharge.  Respiratory: Negative for shortness of breath and wheezing.   Cardiovascular: Negative for chest pain and leg swelling.  Gastrointestinal: Negative for abdominal pain.  Musculoskeletal: Negative for back pain and gait problem.  Skin: Negative for rash.  Neurological: Negative for dizziness, weakness, light-headedness, numbness and headaches.  All other systems reviewed and are negative.   Per HPI unless specifically indicated  above     Objective:    BP (!) 199/82   Pulse (!) 59   Temp (!) 97.3 F (36.3 C) (Oral)   Ht 5' 10"  (1.778 m)   Wt 184 lb (83.5 kg)   BMI 26.40 kg/m   Wt Readings from Last 3 Encounters:  07/23/17 184 lb (83.5 kg)  06/23/17 188 lb (85.3 kg)  06/09/17 180 lb (81.6 kg)    Physical Exam  Constitutional: He is oriented to person, place, and time. He appears well-developed and well-nourished. No distress.  Eyes: Conjunctivae are normal. No scleral icterus.  Neck: Neck supple. No thyromegaly present.  Cardiovascular: Normal rate, regular rhythm, normal heart sounds and intact distal pulses.  No murmur heard. Pulmonary/Chest: Effort normal and breath sounds normal. No respiratory distress. He has no wheezes.  Musculoskeletal: Normal range of motion. He exhibits no edema.  Lymphadenopathy:    He has no cervical adenopathy.  Neurological: He is alert and oriented to person, place, and time. Coordination normal.  Skin: Skin is warm and dry. No rash noted. He is not diaphoretic.  Psychiatric: He has a normal mood and affect. His behavior is normal.  Nursing note and vitals reviewed.     Assessment & Plan:   Problem List Items Addressed This Visit      Cardiovascular and Mediastinum   Essential hypertension, benign - Primary   Relevant Orders   CMP14+EGFR (Completed)     Digestive   Gastroesophageal reflux disease with esophagitis     Other   Hyperlipidemia LDL goal <100   Relevant Orders  Lipid panel (Completed)     BP elevated because patient has not taken medications today, please continue current medications and reinforced the importance of patient compliance  Follow up plan: Return in about 6 months (around 01/21/2018) for Hypertension recheck.  Counseling provided for all of the vaccine components No orders of the defined types were placed in this encounter.   Caryl Pina, MD [facility] 07/23/2017, 10:57 AM

## 2017-07-23 NOTE — Telephone Encounter (Signed)
Okay also and we will add this to the chart

## 2017-07-29 NOTE — Telephone Encounter (Signed)
Information is in patient chart

## 2017-09-21 ENCOUNTER — Ambulatory Visit (INDEPENDENT_AMBULATORY_CARE_PROVIDER_SITE_OTHER): Payer: Medicare Other | Admitting: Family Medicine

## 2017-09-21 ENCOUNTER — Encounter: Payer: Self-pay | Admitting: Family Medicine

## 2017-09-21 VITALS — BP 191/80 | HR 63 | Temp 97.3°F | Ht 70.0 in | Wt 183.0 lb

## 2017-09-21 DIAGNOSIS — I10 Essential (primary) hypertension: Secondary | ICD-10-CM

## 2017-09-21 DIAGNOSIS — G4733 Obstructive sleep apnea (adult) (pediatric): Secondary | ICD-10-CM | POA: Diagnosis not present

## 2017-09-21 DIAGNOSIS — Z9989 Dependence on other enabling machines and devices: Secondary | ICD-10-CM

## 2017-09-21 NOTE — Patient Instructions (Signed)
Blood Pressure Record Sheet Your blood pressure on this visit to the emergency department or clinic is elevated. This does not necessarily mean you have high blood pressure (hypertension), but it does mean that your blood pressure needs to be rechecked. Many times your blood pressure can increase due to illness, pain, anxiety, or other factors. We recommend that you get a series of blood pressure readings done over a period of 5 days. It is best to get a reading in the morning and one in the evening. You should make sure to sit and relax for 1-5 minutes before the reading is taken. Write the readings down and make a follow-up appointment with your health care provider to discuss the results. If there is not a free clinic or a drug store with a blood-pressure-taking machine near you, you can purchase blood-pressure-taking equipment from a drug store. Having one in the home allows you the convenience of taking your blood pressure while you are home and relaxed. Blood Pressure Log Date: _______________________  a.m. _____________________  p.m. _____________________  Date: _______________________  a.m. _____________________  p.m. _____________________  Date: _______________________  a.m. _____________________  p.m. _____________________  Date: _______________________  a.m. _____________________  p.m. _____________________  Date: _______________________  a.m. _____________________  p.m. _____________________  This information is not intended to replace advice given to you by your health care provider. Make sure you discuss any questions you have with your health care provider. Document Released: 04/11/2003 Document Revised: 06/26/2016 Document Reviewed: 09/05/2013 Elsevier Interactive Patient Education  2018 Elsevier Inc[employer]  

## 2017-09-21 NOTE — Progress Notes (Signed)
Subjective: CC:HTN PCP: Chipper Herb, MD James [name] is a 72 y.o. male presenting to clinic today for:  1. Hypertension Patient reports Blood pressure at home: 170/70s; Meds: Compliant with Clonidine 0.1mg  TID, Norvasc 10mg , Metoprolol 50mg , Side effects: none.  He notes that he had a sensation of flushing yesterday that lasted a few seconds.  He denied associated anxiety.  During flushing episode he felt like he had a fluttering/vibrating in his chest.  This lasted only a few seconds and resolved.  He has had no repeat episodes since that time.  No preceding episodes prior to yesterday.  Additionally, he has not taken any of his blood pressure medications this morning because "he did not know who he is going to see today".  He reports compliance with CPAP but notes that he did not use it yesterday.  Has an appointment to see his vascular surgeon in June.  Denies headache, dizziness, visual changes, nausea, vomiting, chest pain, LE swelling, abdominal pain or shortness of breath.  Past medical history significant for HTN, sleep apnea on CPAP, renal artery stenosis and occlusion and stenosis of bilateral carotid arteries.  No history of MI or CVA.   ROS: Per HPI  Allergies  Allergen Reactions  . Plavix [Clopidogrel Bisulfate] Other (See Comments)    GI Bleed   Past Medical History:  Diagnosis Date  . Arthritis   . Carotid artery occlusion   . DVT (deep venous thrombosis) ([place]) 2008  . DVT (deep venous thrombosis) ([place])   . GERD (gastroesophageal reflux disease)    takes Protonix daily  . Gout    takes Allopurinol daily and Indomethacin prn  . Gout flare   . H/O renal artery stenosis   . H/O: GI bleed   . History of blood transfusion   . History of colon polyps   . History of gastric ulcer   . Hyperlipidemia    takes Lipitor daily  . Hypertension    takes Metoprolol and Amlodipine daily  . Joint pain   . Joint swelling     Current Outpatient Medications:  .   acetaminophen (TYLENOL) 500 MG tablet, Take 500 mg by mouth 2 (two) times daily as needed (pain)., Disp: , Rfl:  .  allopurinol (ZYLOPRIM) 100 MG tablet, Take 2 tablets (200 mg total) daily by mouth., Disp: 180 tablet, Rfl: 3 .  amLODipine (NORVASC) 10 MG tablet, Take 1 tablet (10 mg total) daily by mouth., Disp: 90 tablet, Rfl: 1 .  aspirin 81 MG tablet, Take 81 mg by mouth 2 (two) times daily with a meal. , Disp: , Rfl:  .  atorvastatin (LIPITOR) 40 MG tablet, Take 1 tablet (40 mg total) daily at 6 PM by mouth., Disp: 90 tablet, Rfl: 1 .  cloNIDine (CATAPRES) 0.1 MG tablet, Take 1 tablet (0.1 mg total) 3 (three) times daily by mouth., Disp: 270 tablet, Rfl: 1 .  metoprolol tartrate (LOPRESSOR) 50 MG tablet, Take 1.5 tablets (75 mg total) 2 (two) times daily by mouth., Disp: 270 tablet, Rfl: 3 .  pantoprazole (PROTONIX) 40 MG tablet, Take 1 tablet (40 mg total) daily with breakfast by mouth., Disp: 90 tablet, Rfl: 1 Social History   Socioeconomic History  . Marital status: Married    Spouse name: Not on file  . Number of children: 2  . Years of education: Not on file  . Highest education level: Not on file  Social Needs  . Financial resource strain: Not on file  .  Food insecurity - worry: Not on file  . Food insecurity - inability: Not on file  . Transportation needs - medical: Not on file  . Transportation needs - non-medical: Not on file  Occupational History  . Not on file  Tobacco Use  . Smoking status: Former Smoker    Last attempt to quit: 01/02/2000    Years since quitting: 17.7  . Smokeless tobacco: Former Systems developer    Quit date: 12/26/1999  Substance and Sexual Activity  . Alcohol use: No    Comment: Quit in 1998  . Drug use: No  . Sexual activity: Yes  Other Topics Concern  . Not on file  Social History Narrative  . Not on file   Family History  Problem Relation Age of Onset  . Hypertension Mother   . Hyperlipidemia Mother   . Other Mother        varicose veins  .  Hyperlipidemia Father   . Hypertension Father   . Diabetes Father   . Clotting disorder Father   . Deep vein thrombosis Father   . Hypertension Sister   . Diabetes Sister   . Hyperlipidemia Sister   . Varicose Veins Sister   . Other Sister        Bleeding problems  . Hypertension Brother   . Hyperlipidemia Brother     Objective: Office vital signs reviewed. BP (!) 204/84   Pulse 63   Temp (!) 97.3 F (36.3 C) (Oral)   Ht 5\' 10"  (1.778 m)   Wt 183 lb (83 kg)   BMI 26.26 kg/m   Physical Examination:  General: Awake, alert, well nourished, No acute distress HEENT: Normal    Neck: No masses palpated. No lymphadenopathy; well-healed vertical scars appreciated bilateral of her carotids. No JVD/ carotid bruits    Eyes: PERRLA, extraocular membranes intact, sclera white    Throat: moist mucus membranes Cardio: regular rate and rhythm, S1S2 heard, no murmurs appreciated Pulm: clear to auscultation bilaterally, no wheezes, rhonchi or rales; normal work of breathing on room air Extremities: warm, well perfused, No edema, cyanosis or clubbing; +1 pulses bilaterally MSK: norml gait and normal station Neuro: AOx3, follows commands, no focal deficits  Assessment/ Plan: 72 y.o. male   1. Accelerated hypertension Systolic blood pressure elevated to 204 during today's office visit.  Patient is completely asymptomatic.  Has a history of renal artery stenosis status post stent.  Per conversation, he noted that stent did not "take ".  It is possible that persistent renal artery stenosis can be causing his symptoms.  He was unable to tell me whether or not he is ever been on an ACE inhibitor or hydralazine.  He does have elevated creatinine.  ACE inhibitor/ARB may worsen this.  His physical exam was unremarkable.  I had him take his home blood pressure medications in office.  He was monitored and blood pressure was repeated.  HIs repeat blood pressure was 191/80.  I had a long, frank discussion  with patient with regards to his medications, his risk of CVA/MI the setting of uncontrolled blood pressures.  Could consider increasing dose of clonidine versus switching to patch versus adding hydralazine to better control blood pressure.  He is on max dose of Norvasc and I doubt that his pulse would tolerate increased Beta blocker.  Consider ambulatory BP monitoring.  I recommended that he follow-up with his PCP within the next week for blood pressure recheck.  He will take his blood pressure medications prior to  that office visit.   2. OSA on CPAP   Strict return precautions and reasons for emergent evaluation in the emergency department review with patient.  They voiced understanding and will follow-up as needed.      Janora Norlander, DO [place] [phone]

## 2017-09-30 ENCOUNTER — Ambulatory Visit (INDEPENDENT_AMBULATORY_CARE_PROVIDER_SITE_OTHER): Payer: Medicare Other | Admitting: Family Medicine

## 2017-09-30 ENCOUNTER — Telehealth: Payer: Self-pay | Admitting: *Deleted

## 2017-09-30 ENCOUNTER — Encounter: Payer: Self-pay | Admitting: Family Medicine

## 2017-09-30 VITALS — BP 190/82 | HR 56 | Temp 97.3°F | Ht 70.0 in | Wt 184.0 lb

## 2017-09-30 DIAGNOSIS — I701 Atherosclerosis of renal artery: Secondary | ICD-10-CM | POA: Diagnosis not present

## 2017-09-30 DIAGNOSIS — I159 Secondary hypertension, unspecified: Secondary | ICD-10-CM | POA: Diagnosis not present

## 2017-09-30 MED ORDER — SPIRONOLACTONE 25 MG PO TABS: 25.0000 mg | ORAL_TABLET | Freq: Every day | ORAL | 1 refills | Status: DC

## 2017-09-30 NOTE — Telephone Encounter (Signed)
Patient called and requested appointment with Dr. Trula Slade. " Trouble with elevated B/P and PCP (according to patient ) thinks issue with renal artery and stent". Is to have blood work done the week of 10/04/17. He desires to see doctor to ask questions.

## 2017-09-30 NOTE — Progress Notes (Signed)
BP (!) 190/82   Pulse (!) 56   Temp (!) 97.3 F (36.3 C) (Oral)   Ht 5' 10" (1.778 m)   Wt 184 lb (83.5 kg)   BMI 26.40 kg/m    Subjective:    Patient ID: James [name], male    DOB: 02-27-1946, 72 y.o.   MRN: [id number]  HPI: James [name] is a 72 y.o. male presenting on 09/30/2017 for Follow up elevated blood pressure   HPI Hypertension Patient is currently on amlodipine 10 and clonidine 0.1 -3 times daily and metoprolol 75 twice daily, and their blood pressure today is 190/82, patient has known renal artery stenosis and was told last year that his stents had failed but his blood pressure was doing fine then. Patient denies any lightheadedness or dizziness. Patient denies headaches, blurred vision, chest pains, shortness of breath, or weakness. Denies any side effects from medication and is content with current medication.   Relevant past medical, surgical, family and social history reviewed and updated as indicated. Interim medical history since our last visit reviewed. Allergies and medications reviewed and updated.  Review of Systems  Constitutional: Negative for chills and fever.  Eyes: Negative for visual disturbance.  Respiratory: Negative for shortness of breath and wheezing.   Cardiovascular: Negative for chest pain and leg swelling.  Musculoskeletal: Negative for back pain and gait problem.  Skin: Negative for rash.  Neurological: Negative for dizziness, weakness, light-headedness, numbness and headaches.  All other systems reviewed and are negative.   Per HPI unless specifically indicated above   Allergies as of 09/30/2017      Reactions   Plavix [clopidogrel Bisulfate] Other (See Comments)   GI Bleed      Medication List        Accurate as of 09/30/17  9:39 AM. Always use your most recent med list.          acetaminophen 500 MG tablet Commonly known as:  TYLENOL Take 500 mg by mouth 2 (two) times daily as needed (pain).   allopurinol 100 MG  tablet Commonly known as:  ZYLOPRIM Take 2 tablets (200 mg total) daily by mouth.   amLODipine 10 MG tablet Commonly known as:  NORVASC Take 1 tablet (10 mg total) daily by mouth.   aspirin 81 MG tablet Take 81 mg by mouth 2 (two) times daily with a meal.   atorvastatin 40 MG tablet Commonly known as:  LIPITOR Take 1 tablet (40 mg total) daily at 6 PM by mouth.   cloNIDine 0.1 MG tablet Commonly known as:  CATAPRES Take 1 tablet (0.1 mg total) 3 (three) times daily by mouth.   metoprolol tartrate 50 MG tablet Commonly known as:  LOPRESSOR Take 1.5 tablets (75 mg total) 2 (two) times daily by mouth.   pantoprazole 40 MG tablet Commonly known as:  PROTONIX Take 1 tablet (40 mg total) daily with breakfast by mouth.   spironolactone 25 MG tablet Commonly known as:  ALDACTONE Take 1 tablet (25 mg total) by mouth daily.          Objective:    BP (!) 190/82   Pulse (!) 56   Temp (!) 97.3 F (36.3 C) (Oral)   Ht 5' 10" (1.778 m)   Wt 184 lb (83.5 kg)   BMI 26.40 kg/m   Wt Readings from Last 3 Encounters:  09/30/17 184 lb (83.5 kg)  09/21/17 183 lb (83 kg)  07/23/17 184 lb (83.5 kg)    Physical Exam  Constitutional: He is oriented to person, place, and time. He appears well-developed and well-nourished. No distress.  Eyes: Conjunctivae are normal. No scleral icterus.  Neck: Neck supple. No thyromegaly present.  Cardiovascular: Normal rate, regular rhythm, normal heart sounds and intact distal pulses.  No murmur heard. Pulmonary/Chest: Effort normal and breath sounds normal. No respiratory distress. He has no wheezes.  Lymphadenopathy:    He has no cervical adenopathy.  Neurological: He is alert and oriented to person, place, and time. Coordination normal.  Skin: Skin is warm and dry. No rash noted. He is not diaphoretic.  Psychiatric: He has a normal mood and affect. His behavior is normal.  Nursing note and vitals reviewed.      Assessment & Plan:   Problem  List Items Addressed This Visit      Cardiovascular and Mediastinum   Renal artery stenosis (HCC) - Primary   Relevant Medications   spironolactone (ALDACTONE) 25 MG tablet   Other Relevant Orders   BMP8+EGFR   Accelerated secondary hypertension   Relevant Medications   spironolactone (ALDACTONE) 25 MG tablet   Other Relevant Orders   BMP8+EGFR       Follow up plan: Return in about 4 weeks (around 10/28/2017), or if symptoms worsen or fail to improve, for 1 week for lab draw in 1 month for visit.  Counseling provided for all of the vaccine components Orders Placed This Encounter  Procedures  . BMP8+EGFR     , MD [facility] 09/30/2017, 9:39 AM     

## 2017-10-07 ENCOUNTER — Other Ambulatory Visit: Payer: Medicare Other

## 2017-10-07 DIAGNOSIS — I159 Secondary hypertension, unspecified: Secondary | ICD-10-CM | POA: Diagnosis not present

## 2017-10-07 DIAGNOSIS — I701 Atherosclerosis of renal artery: Secondary | ICD-10-CM

## 2017-10-08 LAB — BMP8+EGFR
BUN/Creatinine Ratio: 12 (ref 10–24)
BUN: 21 mg/dL (ref 8–27)
CO2: 24 mmol/L (ref 20–29)
Calcium: 9.2 mg/dL (ref 8.6–10.2)
Chloride: 103 mmol/L (ref 96–106)
Creatinine, Ser: 1.75 mg/dL — ABNORMAL HIGH (ref 0.76–1.27)
GFR calc Af Amer: 44 mL/min/{1.73_m2} — ABNORMAL LOW (ref >59)
GFR calc non Af Amer: 38 mL/min/{1.73_m2} — ABNORMAL LOW (ref >59)
Glucose: 96 mg/dL (ref 65–99)
Potassium: 4.5 mmol/L (ref 3.5–5.2)
Sodium: 142 mmol/L (ref 134–144)

## 2017-10-18 ENCOUNTER — Ambulatory Visit (INDEPENDENT_AMBULATORY_CARE_PROVIDER_SITE_OTHER): Payer: Medicare Other | Admitting: Surgery

## 2017-10-18 ENCOUNTER — Other Ambulatory Visit: Payer: Self-pay

## 2017-10-18 ENCOUNTER — Encounter: Payer: Self-pay | Admitting: Surgery

## 2017-10-18 VITALS — BP 196/88 | HR 63 | Temp 99.8°F | Resp 16 | Ht 70.0 in | Wt 183.0 lb

## 2017-10-18 DIAGNOSIS — I701 Atherosclerosis of renal artery: Secondary | ICD-10-CM

## 2017-10-18 DIAGNOSIS — Z9889 Other specified postprocedural states: Secondary | ICD-10-CM | POA: Diagnosis not present

## 2017-10-18 DIAGNOSIS — N289 Disorder of kidney and ureter, unspecified: Secondary | ICD-10-CM

## 2017-10-18 NOTE — Progress Notes (Signed)
[facility]  Patient name: James [name] MRN: [id number] DOB: Nov 06, 1945 Sex: male   REASON FOR VISIT:    Follow up  James [name]:   Keller Bounds Cocklereeceis a 72 y.o.malereturns today for follow-up. He is status post left carotid endarterectomy for asymptomatic stenosis in November 2013. He is status post right carotid endarterectomy in 2008. In 2009 he underwent a right renal artery stenting secondary to elevated creatinine and blood pressure. His creatinine has remained stable. He does suffer from whitecoat hypertension. Last year he was identified to have a progressive stenosis within his right renal artery stent and on 12/03/2015 he underwent renal angiography. 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 The right kidney was perfused to an accessory vessel which did appear to opacify the main renal artery.   He is concerned about all the medication he is taking and his blood pressure which has been difficult to control.  He also is complaining of a lot of gas.   PAST MEDICAL HISTORY:   Past Medical History:  Diagnosis Date  . Arthritis   . Carotid artery occlusion   . DVT (deep venous thrombosis) ([place]) 2008  . DVT (deep venous thrombosis) ([place])   . GERD (gastroesophageal reflux disease)    takes Protonix daily  . Gout    takes Allopurinol daily and Indomethacin prn  . Gout flare   . H/O renal artery stenosis   . H/O: GI bleed   . History of blood transfusion   . History of colon polyps   . History of gastric ulcer   . Hyperlipidemia    takes Lipitor daily  . Hypertension    takes Metoprolol and Amlodipine daily  . Joint pain   . Joint swelling      FAMILY HISTORY:   Family History  Problem Relation Age of Onset  . Hypertension Mother   . Hyperlipidemia Mother   . Other Mother        varicose veins  . Hyperlipidemia Father   . Hypertension Father    . Diabetes Father   . Clotting disorder Father   . Deep vein thrombosis Father   . Hypertension Sister   . Diabetes Sister   . Hyperlipidemia Sister   . Varicose Veins Sister   . Other Sister        Bleeding problems  . Hypertension Brother   . Hyperlipidemia Brother     SOCIAL HISTORY:   Social History   Tobacco Use  . Smoking status: Former Smoker    Last attempt to quit: 01/02/2000    Years since quitting: 17.8  . Smokeless tobacco: Former Systems developer    Quit date: 12/26/1999  Substance Use Topics  . Alcohol use: No    Comment: Quit in 1998     ALLERGIES:   Allergies  Allergen Reactions  . Plavix [Clopidogrel Bisulfate] Other (See Comments)    GI Bleed     CURRENT MEDICATIONS:   Current Outpatient Medications  Medication Sig Dispense Refill  . acetaminophen (TYLENOL) 500 MG tablet Take 500 mg by mouth 2 (two) times daily as needed (pain).    Marland Kitchen allopurinol (ZYLOPRIM) 100 MG tablet Take 2 tablets (200 mg total) daily by mouth. 180 tablet 3  . amLODipine (NORVASC) 10 MG tablet Take 1 tablet (10 mg total) daily by mouth. 90 tablet 1  . aspirin 81 MG tablet Take 81 mg by mouth 2 (two) times daily with a meal.     .  atorvastatin (LIPITOR) 40 MG tablet Take 1 tablet (40 mg total) daily at 6 PM by mouth. 90 tablet 1  . cloNIDine (CATAPRES) 0.1 MG tablet Take 1 tablet (0.1 mg total) 3 (three) times daily by mouth. 270 tablet 1  . metoprolol tartrate (LOPRESSOR) 50 MG tablet Take 1.5 tablets (75 mg total) 2 (two) times daily by mouth. 270 tablet 3  . pantoprazole (PROTONIX) 40 MG tablet Take 1 tablet (40 mg total) daily with breakfast by mouth. 90 tablet 1  . spironolactone (ALDACTONE) 25 MG tablet Take 1 tablet (25 mg total) by mouth daily. 30 tablet 1   No current facility-administered medications for this visit.     REVIEW OF SYSTEMS:   [X]  denotes positive finding, [ ]  denotes negative finding Cardiac  Comments:  Chest pain or chest pressure:    Shortness of breath  upon exertion:    Short of breath when lying flat:    Irregular heart rhythm:        Vascular    Pain in calf, thigh, or hip brought on by ambulation:    Pain in feet at night that wakes you up from your sleep:     Blood clot in your veins:    Leg swelling:  x       Pulmonary    Oxygen at home:    Productive cough:     Wheezing:         Neurologic    Sudden weakness in arms or legs:     Sudden numbness in arms or legs:     Sudden onset of difficulty speaking or slurred speech:    Temporary loss of vision in one eye:     Problems with dizziness:         Gastrointestinal    Blood in stool:     Vomited blood:         Genitourinary    Burning when urinating:     Blood in urine:        Psychiatric    Major depression:         Hematologic    Bleeding problems:    Problems with blood clotting too easily:        Skin    Rashes or ulcers:        Constitutional    Fever or chills:      PHYSICAL EXAM:   Vitals:   10/18/17 1543  BP: (!) 196/88  Pulse: 63  Resp: 16  Temp: 99.8 F (37.7 C)  TempSrc: Oral  SpO2: 98%  Weight: 183 lb (83 kg)  Height: 5\' 10"  (1.778 m)    GENERAL: The patient is a well-nourished male, in no acute distress. The vital signs are documented above. CARDIAC: There is a regular rate and rhythm.  VASCULAR: No carotid bruits.  2+ edema bilaterally PULMONARY: Non-labored respirations none MUSCULOSKELETAL: There are no major deformities or cyanosis. NEUROLOGIC: No focal weakness or paresthesias are detected. SKIN: There are no ulcers or rashes noted. PSYCHIATRIC: The patient has a normal affect.  STUDIES:   None  MEDICAL ISSUES:   Hypertension: I showed the patient pictures of his occluded right renal artery stent and the accessory renal that maintains perfusion to the right kidney.  I do not feel there is any role for nephrectomy at this time.  I have encouraged him to continue to stay hydrated and minimize caffeine intake.  We also  discussed the importance of regular exercise weight loss and better  nutrition to help with his blood pressure.  Carotid stenosis: I have scheduled him to follow-up later this year with a carotid duplex.    Annamarie Major, MD [facility] [phone] Pager [phone]

## 2017-10-21 ENCOUNTER — Ambulatory Visit (INDEPENDENT_AMBULATORY_CARE_PROVIDER_SITE_OTHER): Payer: Medicare Other | Admitting: Family Medicine

## 2017-10-21 ENCOUNTER — Encounter: Payer: Self-pay | Admitting: Family Medicine

## 2017-10-21 VITALS — BP 188/81 | HR 57 | Temp 98.2°F | Ht 70.0 in | Wt 182.0 lb

## 2017-10-21 DIAGNOSIS — K2100 Gastro-esophageal reflux disease with esophagitis, without bleeding: Secondary | ICD-10-CM

## 2017-10-21 DIAGNOSIS — I159 Secondary hypertension, unspecified: Secondary | ICD-10-CM

## 2017-10-21 DIAGNOSIS — N183 Chronic kidney disease, stage 3 unspecified: Secondary | ICD-10-CM | POA: Insufficient documentation

## 2017-10-21 DIAGNOSIS — E785 Hyperlipidemia, unspecified: Secondary | ICD-10-CM | POA: Diagnosis not present

## 2017-10-21 DIAGNOSIS — I701 Atherosclerosis of renal artery: Secondary | ICD-10-CM | POA: Diagnosis not present

## 2017-10-21 MED ORDER — CLONIDINE HCL 0.2 MG PO TABS
0.2000 mg | ORAL_TABLET | Freq: Three times a day (TID) | ORAL | 3 refills | Status: DC
Start: 2017-10-21 — End: 2018-01-21

## 2017-10-21 NOTE — Progress Notes (Signed)
BP (!) 188/81   Pulse (!) 57   Temp 98.2 F (36.8 C) (Oral)   Ht 5' 10"  (1.778 m)   Wt 182 lb (82.6 kg)   BMI 26.11 kg/m    Subjective:    Patient ID: James [name], male    DOB: Mar 10, 1946, 72 y.o.   MRN: [id number]  HPI: James [name] is a 72 y.o. male presenting on 10/21/2017 for Hypertension (3 mo) and Hyperlipidemia   HPI Hypertension Patient is currently on spironolactone and metoprolol and clonidine and amlodipine, and their blood pressure today is 188/81, patient does admit that he did not take his blood pressure medications today.  He has been taking his blood pressure at home though and consistently is running in the 170s over 70s and 180s over 70s and occasionally he will have one in the 160s over 70s.  His heart rate is typically running in the 50s. Patient denies any lightheadedness or dizziness. Patient denies headaches, blurred vision, chest pains, shortness of breath, or weakness. Denies any side effects from medication and is content with current medication.  Patient has known renal artery stenosis and stage III CKD which was slightly worse on last lab draw, we will recheck today  Hyperlipidemia Patient is coming in for recheck of his hyperlipidemia. The patient is currently taking Lipitor. They deny any issues with myalgias or history of liver damage from it. They deny any focal numbness or weakness or chest pain.   GERD Patient is currently on Protonix.  She denies any major symptoms or abdominal pain or belching or burping. She denies any blood in her stool or lightheadedness or dizziness.   Relevant past medical, surgical, family and social history reviewed and updated as indicated. Interim medical history since our last visit reviewed. Allergies and medications reviewed and updated.  Review of Systems  Constitutional: Negative for chills and fever.  Respiratory: Negative for shortness of breath and wheezing.   Cardiovascular: Negative for chest pain  and leg swelling.  Gastrointestinal: Negative for abdominal pain, diarrhea, nausea and vomiting.  Musculoskeletal: Negative for back pain and gait problem.  Skin: Negative for rash.  Neurological: Negative for dizziness, weakness and light-headedness.  All other systems reviewed and are negative.   Per HPI unless specifically indicated above   Allergies as of 10/21/2017      Reactions   Plavix [clopidogrel Bisulfate] Other (See Comments)   GI Bleed      Medication List        Accurate as of 10/21/17 10:12 AM. Always use your most recent med list.          acetaminophen 500 MG tablet Commonly known as:  TYLENOL Take 500 mg by mouth 2 (two) times daily as needed (pain).   allopurinol 100 MG tablet Commonly known as:  ZYLOPRIM Take 2 tablets (200 mg total) daily by mouth.   amLODipine 10 MG tablet Commonly known as:  NORVASC Take 1 tablet (10 mg total) daily by mouth.   aspirin 81 MG tablet Take 81 mg by mouth 2 (two) times daily with a meal.   atorvastatin 40 MG tablet Commonly known as:  LIPITOR Take 1 tablet (40 mg total) daily at 6 PM by mouth.   cloNIDine 0.2 MG tablet Commonly known as:  CATAPRES Take 1 tablet (0.2 mg total) by mouth 3 (three) times daily.   metoprolol tartrate 50 MG tablet Commonly known as:  LOPRESSOR Take 1.5 tablets (75 mg total) 2 (two) times daily by  mouth.   pantoprazole 40 MG tablet Commonly known as:  PROTONIX Take 1 tablet (40 mg total) daily with breakfast by mouth.   spironolactone 25 MG tablet Commonly known as:  ALDACTONE Take 1 tablet (25 mg total) by mouth daily.          Objective:    BP (!) 188/81   Pulse (!) 57   Temp 98.2 F (36.8 C) (Oral)   Ht 5' 10"  (1.778 m)   Wt 182 lb (82.6 kg)   BMI 26.11 kg/m   Wt Readings from Last 3 Encounters:  10/21/17 182 lb (82.6 kg)  10/18/17 183 lb (83 kg)  09/30/17 184 lb (83.5 kg)    Physical Exam  Constitutional: He is oriented to person, place, and time. He  appears well-developed and well-nourished. No distress.  Eyes: Conjunctivae are normal. No scleral icterus.  Neck: Neck supple. No thyromegaly present.  Cardiovascular: Normal rate, regular rhythm, normal heart sounds and intact distal pulses.  No murmur heard. Pulmonary/Chest: Effort normal and breath sounds normal. No respiratory distress. He has no wheezes. He has no rales.  Musculoskeletal: Normal range of motion. He exhibits no edema.  Lymphadenopathy:    He has no cervical adenopathy.  Neurological: He is alert and oriented to person, place, and time. Coordination normal.  Skin: Skin is warm and dry. No rash noted. He is not diaphoretic.  Psychiatric: He has a normal mood and affect. His behavior is normal.  Nursing note and vitals reviewed.   Results for orders placed or performed in visit on 10/07/17  BMP8+EGFR  Result Value Ref Range   Glucose 96 65 - 99 mg/dL   BUN 21 8 - 27 mg/dL   Creatinine, Ser 1.75 (H) 0.76 - 1.27 mg/dL   GFR calc non Af Amer 38 (L) >59 mL/min/1.73   GFR calc Af Amer 44 (L) >59 mL/min/1.73   BUN/Creatinine Ratio 12 10 - 24   Sodium 142 134 - 144 mmol/L   Potassium 4.5 3.5 - 5.2 mmol/L   Chloride 103 96 - 106 mmol/L   CO2 24 20 - 29 mmol/L   Calcium 9.2 8.6 - 10.2 mg/dL      Assessment & Plan:   Problem List Items Addressed This Visit      Cardiovascular and Mediastinum   Renal artery stenosis (HCC)   Relevant Medications   cloNIDine (CATAPRES) 0.2 MG tablet   Accelerated secondary hypertension - Primary   Relevant Medications   cloNIDine (CATAPRES) 0.2 MG tablet   Other Relevant Orders   CMP14+EGFR     Digestive   Gastroesophageal reflux disease with esophagitis     Genitourinary   Chronic kidney disease (CKD), stage III (moderate) (HCC)     Other   Hyperlipidemia LDL goal <100   Relevant Medications   cloNIDine (CATAPRES) 0.2 MG tablet   Other Relevant Orders   Lipid panel       Follow up plan: Return in about 3 months  (around 01/21/2018), or if symptoms worsen or fail to improve, for Hypertension cholesterol.  Counseling provided for all of the vaccine components Orders Placed This Encounter  Procedures  . CMP14+EGFR  . Lipid panel    Caryl Pina, MD [facility] 10/21/2017, 10:12 AM

## 2017-10-22 ENCOUNTER — Other Ambulatory Visit: Payer: Self-pay

## 2017-10-22 DIAGNOSIS — I6523 Occlusion and stenosis of bilateral carotid arteries: Secondary | ICD-10-CM

## 2017-10-22 LAB — CMP14+EGFR
ALT: 12 IU/L (ref 0–44)
AST: 18 IU/L (ref 0–40)
Albumin/Globulin Ratio: 1.6 (ref 1.2–2.2)
Albumin: 4.1 g/dL (ref 3.5–4.8)
Alkaline Phosphatase: 190 IU/L — ABNORMAL HIGH (ref 39–117)
BUN/Creatinine Ratio: 10 (ref 10–24)
BUN: 19 mg/dL (ref 8–27)
Bilirubin Total: 0.8 mg/dL (ref 0.0–1.2)
CO2: 25 mmol/L (ref 20–29)
Calcium: 9.3 mg/dL (ref 8.6–10.2)
Chloride: 101 mmol/L (ref 96–106)
Creatinine, Ser: 1.84 mg/dL — ABNORMAL HIGH (ref 0.76–1.27)
GFR calc Af Amer: 41 mL/min/{1.73_m2} — ABNORMAL LOW (ref >59)
GFR calc non Af Amer: 36 mL/min/{1.73_m2} — ABNORMAL LOW (ref >59)
Globulin, Total: 2.5 g/dL (ref 1.5–4.5)
Glucose: 85 mg/dL (ref 65–99)
Potassium: 4.3 mmol/L (ref 3.5–5.2)
Sodium: 141 mmol/L (ref 134–144)
Total Protein: 6.6 g/dL (ref 6.0–8.5)

## 2017-10-22 LAB — LIPID PANEL
Chol/HDL Ratio: 2.4 ratio (ref 0.0–5.0)
Cholesterol, Total: 124 mg/dL (ref 100–199)
HDL: 52 mg/dL (ref >39)
LDL Calculated: 57 mg/dL (ref 0–99)
Triglycerides: 73 mg/dL (ref 0–149)
VLDL Cholesterol Cal: 15 mg/dL (ref 5–40)

## 2017-10-25 ENCOUNTER — Telehealth: Payer: Self-pay | Admitting: Family Medicine

## 2017-10-25 NOTE — Telephone Encounter (Signed)
Explained to pt that the referral was for nephrology not urology Pt verbalizes understanding

## 2017-11-26 DIAGNOSIS — R3915 Urgency of urination: Secondary | ICD-10-CM | POA: Diagnosis not present

## 2017-11-26 DIAGNOSIS — N401 Enlarged prostate with lower urinary tract symptoms: Secondary | ICD-10-CM | POA: Diagnosis not present

## 2017-12-06 ENCOUNTER — Other Ambulatory Visit: Payer: Self-pay | Admitting: Family Medicine

## 2017-12-06 DIAGNOSIS — E785 Hyperlipidemia, unspecified: Secondary | ICD-10-CM

## 2017-12-06 DIAGNOSIS — I10 Essential (primary) hypertension: Secondary | ICD-10-CM

## 2017-12-10 DIAGNOSIS — M109 Gout, unspecified: Secondary | ICD-10-CM | POA: Diagnosis not present

## 2017-12-10 DIAGNOSIS — N183 Chronic kidney disease, stage 3 (moderate): Secondary | ICD-10-CM | POA: Diagnosis not present

## 2017-12-10 DIAGNOSIS — I129 Hypertensive chronic kidney disease with stage 1 through stage 4 chronic kidney disease, or unspecified chronic kidney disease: Secondary | ICD-10-CM | POA: Diagnosis not present

## 2017-12-10 DIAGNOSIS — D631 Anemia in chronic kidney disease: Secondary | ICD-10-CM | POA: Diagnosis not present

## 2017-12-13 ENCOUNTER — Encounter (HOSPITAL_COMMUNITY): Payer: Medicare Other

## 2017-12-13 ENCOUNTER — Ambulatory Visit: Payer: Medicare Other | Admitting: Family

## 2018-01-04 DIAGNOSIS — H52203 Unspecified astigmatism, bilateral: Secondary | ICD-10-CM | POA: Diagnosis not present

## 2018-01-04 DIAGNOSIS — H04223 Epiphora due to insufficient drainage, bilateral lacrimal glands: Secondary | ICD-10-CM | POA: Diagnosis not present

## 2018-01-04 DIAGNOSIS — H2513 Age-related nuclear cataract, bilateral: Secondary | ICD-10-CM | POA: Diagnosis not present

## 2018-01-21 ENCOUNTER — Encounter: Payer: Self-pay | Admitting: Family Medicine

## 2018-01-21 ENCOUNTER — Ambulatory Visit (INDEPENDENT_AMBULATORY_CARE_PROVIDER_SITE_OTHER): Payer: Medicare Other | Admitting: Family Medicine

## 2018-01-21 VITALS — BP 173/85 | HR 59 | Temp 99.6°F | Ht 70.0 in | Wt 183.0 lb

## 2018-01-21 DIAGNOSIS — K2100 Gastro-esophageal reflux disease with esophagitis, without bleeding: Secondary | ICD-10-CM

## 2018-01-21 DIAGNOSIS — N183 Chronic kidney disease, stage 3 unspecified: Secondary | ICD-10-CM

## 2018-01-21 DIAGNOSIS — I701 Atherosclerosis of renal artery: Secondary | ICD-10-CM | POA: Diagnosis not present

## 2018-01-21 DIAGNOSIS — I159 Secondary hypertension, unspecified: Secondary | ICD-10-CM | POA: Diagnosis not present

## 2018-01-21 DIAGNOSIS — E785 Hyperlipidemia, unspecified: Secondary | ICD-10-CM

## 2018-01-21 MED ORDER — AMLODIPINE-ATORVASTATIN 10-40 MG PO TABS: 1.0000 | ORAL_TABLET | Freq: Every day | ORAL | 1 refills | Status: DC

## 2018-01-21 MED ORDER — PANTOPRAZOLE SODIUM 40 MG PO TBEC: 40.0000 mg | DELAYED_RELEASE_TABLET | Freq: Every day | ORAL | 1 refills | Status: DC

## 2018-01-21 NOTE — Progress Notes (Signed)
BP (!) 173/85   Pulse (!) 59   Temp 99.6 F (37.6 C) (Oral)   Ht 5' 10"  (1.778 m)   Wt 183 lb (83 kg)   BMI 26.26 kg/m    Subjective:    Patient ID: James [name], male    DOB: Jan 19, 1946, 72 y.o.   MRN: [id number]  HPI: James [name] is a 72 y.o. male presenting on 01/21/2018 for Hypertension (has been going to [place]; saw urologist before and he was instructed to take Myrbetriq 25 mg daily x 3 weeks, stopped 12/20/2017); Gastroesophageal Reflux; Hyperlipidemia; and Gynecologic Exam   HPI Hypertension and renal artery stenosis Patient is currently on amlodipine and metoprolol and clonidine but feels like the clonidine makes him tired all the time and is not helping, and their blood pressure today is 173/85, patient does have a nephrologist but they have not adjusted his blood pressure medications at this point. Patient denies any lightheadedness or dizziness. Patient denies headaches, blurred vision, chest pains, shortness of breath, or weakness. Denies any side effects from medication and is content with current medication.  Hyperlipidemia Patient is coming in for recheck of his hyperlipidemia. The patient is currently taking Lipitor. They deny any issues with myalgias or history of liver damage from it. They deny any focal numbness or weakness or chest pain.   GERD Patient is currently on Protonix.  She denies any major symptoms or abdominal pain or belching or burping. She denies any blood in her stool or lightheadedness or dizziness.   Stage III CKD Patient comes in for recheck of stage III CKD.  They deny any major urinary issues.  Relevant past medical, surgical, family and social history reviewed and updated as indicated. Interim medical history since our last visit reviewed. Allergies and medications reviewed and updated.  Review of Systems  Constitutional: Negative for chills and fever.  Eyes: Negative for discharge.  Respiratory: Negative for  shortness of breath and wheezing.   Cardiovascular: Negative for chest pain and leg swelling.  Gastrointestinal: Negative for abdominal pain.  Musculoskeletal: Negative for back pain and gait problem.  Skin: Negative for rash.  Neurological: Negative for dizziness, weakness, numbness and headaches.  All other systems reviewed and are negative.   Per HPI unless specifically indicated above   Allergies as of 01/21/2018      Reactions   Plavix [clopidogrel Bisulfate] Other (See Comments)   GI Bleed   Uloric [febuxostat]       Medication List        Accurate as of 01/21/18  1:36 PM. Always use your most recent med list.          acetaminophen 500 MG tablet Commonly known as:  TYLENOL Take 500 mg by mouth 2 (two) times daily as needed (pain).   allopurinol 100 MG tablet Commonly known as:  ZYLOPRIM Take 2 tablets (200 mg total) daily by mouth.   amLODipine-atorvastatin 10-40 MG tablet Commonly known as:  CADUET Take 1 tablet by mouth daily.   aspirin 81 MG tablet Take 81 mg by mouth 2 (two) times daily with a meal.   metoprolol tartrate 50 MG tablet Commonly known as:  LOPRESSOR Take 1.5 tablets (75 mg total) 2 (two) times daily by mouth.   pantoprazole 40 MG tablet Commonly known as:  PROTONIX Take 1 tablet (40 mg total) by mouth daily with breakfast.          Objective:    BP (!) 173/85   Pulse Marland Kitchen)  59   Temp 99.6 F (37.6 C) (Oral)   Ht 5' 10"  (1.778 m)   Wt 183 lb (83 kg)   BMI 26.26 kg/m   Wt Readings from Last 3 Encounters:  01/21/18 183 lb (83 kg)  10/21/17 182 lb (82.6 kg)  10/18/17 183 lb (83 kg)    Physical Exam  Constitutional: He is oriented to person, place, and time. He appears well-developed and well-nourished. No distress.  Eyes: Conjunctivae are normal. No scleral icterus.  Neck: Neck supple. No thyromegaly present.  Cardiovascular: Normal rate, regular rhythm, normal heart sounds and intact distal pulses.  No murmur  heard. Pulmonary/Chest: Effort normal and breath sounds normal. No respiratory distress. He has no wheezes.  Musculoskeletal: Normal range of motion. He exhibits no edema.  Lymphadenopathy:    He has no cervical adenopathy.  Neurological: He is alert and oriented to person, place, and time. Coordination normal.  Skin: Skin is warm and dry. No rash noted. He is not diaphoretic.  Psychiatric: He has a normal mood and affect. His behavior is normal.  Nursing note and vitals reviewed.   Results for orders placed or performed in visit on 10/21/17  CMP14+EGFR  Result Value Ref Range   Glucose 85 65 - 99 mg/dL   BUN 19 8 - 27 mg/dL   Creatinine, Ser 1.84 (H) 0.76 - 1.27 mg/dL   GFR calc non Af Amer 36 (L) >59 mL/min/1.73   GFR calc Af Amer 41 (L) >59 mL/min/1.73   BUN/Creatinine Ratio 10 10 - 24   Sodium 141 134 - 144 mmol/L   Potassium 4.3 3.5 - 5.2 mmol/L   Chloride 101 96 - 106 mmol/L   CO2 25 20 - 29 mmol/L   Calcium 9.3 8.6 - 10.2 mg/dL   Total Protein 6.6 6.0 - 8.5 g/dL   Albumin 4.1 3.5 - 4.8 g/dL   Globulin, Total 2.5 1.5 - 4.5 g/dL   Albumin/Globulin Ratio 1.6 1.2 - 2.2   Bilirubin Total 0.8 0.0 - 1.2 mg/dL   Alkaline Phosphatase 190 (H) 39 - 117 IU/L   AST 18 0 - 40 IU/L   ALT 12 0 - 44 IU/L  Lipid panel  Result Value Ref Range   Cholesterol, Total 124 100 - 199 mg/dL   Triglycerides 73 0 - 149 mg/dL   HDL 52 >39 mg/dL   VLDL Cholesterol Cal 15 5 - 40 mg/dL   LDL Calculated 57 0 - 99 mg/dL   Chol/HDL Ratio 2.4 0.0 - 5.0 ratio      Assessment & Plan:   Problem List Items Addressed This Visit      Cardiovascular and Mediastinum   Renal artery stenosis (HCC)   Relevant Medications   amLODipine-atorvastatin (CADUET) 10-40 MG tablet   Other Relevant Orders   CBC with Differential/Platelet (Completed)   Accelerated secondary hypertension - Primary   Relevant Medications   amLODipine-atorvastatin (CADUET) 10-40 MG tablet   Other Relevant Orders   CMP14+EGFR  (Completed)     Digestive   Gastroesophageal reflux disease with esophagitis   Relevant Medications   pantoprazole (PROTONIX) 40 MG tablet   Other Relevant Orders   CBC with Differential/Platelet (Completed)     Genitourinary   Chronic kidney disease (CKD), stage III (moderate) (HCC)   Relevant Orders   CMP14+EGFR (Completed)     Other   Hyperlipidemia LDL goal <100   Relevant Medications   amLODipine-atorvastatin (CADUET) 10-40 MG tablet   Other Relevant Orders   Lipid panel (  Completed)     Patient does not feel well on clonidine, will defer to nephrology at this point, do not have a lot of options because of renal disease and renal artery stenosis that we can try at this point.  Follow up plan: Return if symptoms worsen or fail to improve.  Counseling provided for all of the vaccine components Orders Placed This Encounter  Procedures  . CBC with Differential/Platelet  . CMP14+EGFR  . Lipid panel    Caryl Pina, MD [facility] 01/21/2018, 1:36 PM

## 2018-01-22 LAB — CMP14+EGFR
ALT: 16 IU/L (ref 0–44)
AST: 20 IU/L (ref 0–40)
Albumin/Globulin Ratio: 1.7 (ref 1.2–2.2)
Albumin: 3.8 g/dL (ref 3.5–4.8)
Alkaline Phosphatase: 160 IU/L — ABNORMAL HIGH (ref 39–117)
BUN/Creatinine Ratio: 10 (ref 10–24)
BUN: 15 mg/dL (ref 8–27)
Bilirubin Total: 0.8 mg/dL (ref 0.0–1.2)
CO2: 27 mmol/L (ref 20–29)
Calcium: 8.8 mg/dL (ref 8.6–10.2)
Chloride: 99 mmol/L (ref 96–106)
Creatinine, Ser: 1.57 mg/dL — ABNORMAL HIGH (ref 0.76–1.27)
GFR calc Af Amer: 50 mL/min/{1.73_m2} — ABNORMAL LOW (ref >59)
GFR calc non Af Amer: 43 mL/min/{1.73_m2} — ABNORMAL LOW (ref >59)
Globulin, Total: 2.3 g/dL (ref 1.5–4.5)
Glucose: 88 mg/dL (ref 65–99)
Potassium: 4 mmol/L (ref 3.5–5.2)
Sodium: 138 mmol/L (ref 134–144)
Total Protein: 6.1 g/dL (ref 6.0–8.5)

## 2018-01-22 LAB — CBC WITH DIFFERENTIAL/PLATELET
Basophils Absolute: 0 10*3/uL (ref 0.0–0.2)
Basos: 1 %
EOS (ABSOLUTE): 0.3 10*3/uL (ref 0.0–0.4)
Eos: 3 %
Hematocrit: 35.7 % — ABNORMAL LOW (ref 37.5–51.0)
Hemoglobin: 12.1 g/dL — ABNORMAL LOW (ref 13.0–17.7)
Immature Grans (Abs): 0 10*3/uL (ref 0.0–0.1)
Immature Granulocytes: 0 %
Lymphocytes Absolute: 1.3 10*3/uL (ref 0.7–3.1)
Lymphs: 16 %
MCH: 28.2 pg (ref 26.6–33.0)
MCHC: 33.9 g/dL (ref 31.5–35.7)
MCV: 83 fL (ref 79–97)
Monocytes Absolute: 0.5 10*3/uL (ref 0.1–0.9)
Monocytes: 6 %
Neutrophils Absolute: 6 10*3/uL (ref 1.4–7.0)
Neutrophils: 74 %
Platelets: 269 10*3/uL (ref 150–450)
RBC: 4.29 x10E6/uL (ref 4.14–5.80)
RDW: 14.7 % (ref 12.3–15.4)
WBC: 8.2 10*3/uL (ref 3.4–10.8)

## 2018-01-22 LAB — LIPID PANEL
Chol/HDL Ratio: 2.3 ratio (ref 0.0–5.0)
Cholesterol, Total: 105 mg/dL (ref 100–199)
HDL: 45 mg/dL (ref >39)
LDL Calculated: 45 mg/dL (ref 0–99)
Triglycerides: 74 mg/dL (ref 0–149)
VLDL Cholesterol Cal: 15 mg/dL (ref 5–40)

## 2018-01-24 ENCOUNTER — Ambulatory Visit: Payer: Medicare Other | Admitting: Family Medicine

## 2018-03-06 ENCOUNTER — Other Ambulatory Visit: Payer: Self-pay | Admitting: Family Medicine

## 2018-03-18 ENCOUNTER — Encounter: Payer: Self-pay | Admitting: Pulmonary Disease

## 2018-03-18 ENCOUNTER — Ambulatory Visit (INDEPENDENT_AMBULATORY_CARE_PROVIDER_SITE_OTHER): Payer: Medicare Other | Admitting: Pulmonary Disease

## 2018-03-18 VITALS — BP 126/82 | HR 66 | Ht 70.0 in | Wt 180.4 lb

## 2018-03-18 DIAGNOSIS — Z9989 Dependence on other enabling machines and devices: Secondary | ICD-10-CM | POA: Diagnosis not present

## 2018-03-18 DIAGNOSIS — I129 Hypertensive chronic kidney disease with stage 1 through stage 4 chronic kidney disease, or unspecified chronic kidney disease: Secondary | ICD-10-CM | POA: Diagnosis not present

## 2018-03-18 DIAGNOSIS — G4733 Obstructive sleep apnea (adult) (pediatric): Secondary | ICD-10-CM | POA: Diagnosis not present

## 2018-03-18 DIAGNOSIS — I701 Atherosclerosis of renal artery: Secondary | ICD-10-CM

## 2018-03-18 DIAGNOSIS — N183 Chronic kidney disease, stage 3 (moderate): Secondary | ICD-10-CM | POA: Diagnosis not present

## 2018-03-18 NOTE — Progress Notes (Signed)
[facility]  Chief Complaint  Patient presents with  . Follow-up    Former [facility] pt for OSA. Per patient, his OSA has been ok since his last visit.     Constitutional: BP 126/82 (BP Location: Left Arm, Patient Position: Sitting, Cuff Size: Normal)   Pulse 66   Ht 5\' 10"  (1.778 m)   Wt 180 lb 6.4 oz (81.8 kg)   SpO2 98%   BMI 25.88 kg/m   History of Present Illness: James [name] is a 72 y.o. male with obstructive sleep apnea.  He was previously followed by Dr. Corrie Dandy.  Home sleep study from June 2017 showed moderate sleep apnea.  He has full face mask.  Mask shifts sometimes and then causes pressure on his nose.  Otherwise now issues with CPAP.  His family says he sleeps better since starting CPAP.  Not having sinus congestion, sore throat, or dry mouth.     Comprehensive Respiratory Exam:  Appearance - well kempt  ENMT - nasal mucosa moist, turbinates clear, midline nasal septum, no dental lesions, no gingival bleeding, no oral exudates, no tonsillar hypertrophy, high arched palate, MP 3 Neck - no masses, trachea midline, no thyromegaly, no elevation in JVP Respiratory - normal appearance of chest wall, normal respiratory effort w/o accessory muscle use, no dullness on percussion, no wheezing or rales CV - s1s2 regular rate and rhythm, no murmurs, no peripheral edema, radial pulses symmetric GI - soft, non tender, no masses Lymph - no adenopathy noted in neck and axillary areas MSK - normal muscle strength and tone, normal gait Ext - no cyanosis, clubbing, or joint inflammation noted Skin - no rashes, lesions, or ulcers Neuro - oriented to person, place, and time Psych - normal mood and affect   Assessment/Plan:  Obstructive sleep apnea. - he is compliant with CPAP and reports benefit - continue auto CPAP  - discussed options to assist with mask fit   Patient Instructions  Can look up CPAP mask options on  line  Follow up in 1 year   Chesley Mires, MD [facility] 03/18/2018, 1:47 PM  Flow Sheet  Sleep tests: HST 01/14/16 >> AHI 25.9, SaO2 low 77% Auto CPAP 02/15/18 to 03/16/18 >> used on 29 of 30 nights with average 7 hrs 58 min.  Average AHI 1.3 with median CPAP 7 and 95 th percentile CPAP 10 cm H2O  Past Medical History: He  has a past medical history of Arthritis, Carotid artery occlusion, DVT (deep venous thrombosis) ([place]) (2008), DVT (deep venous thrombosis) ([place]), GERD (gastroesophageal reflux disease), Gout, Gout flare, H/O renal artery stenosis, H/O: GI bleed, History of blood transfusion, History of colon polyps, History of gastric ulcer, Hyperlipidemia, Hypertension, Joint pain, and Joint swelling.  Past Surgical History: He  has a past surgical history that includes Renal artery stent (2009); Carotid endarterectomy (2008); small intestine removed (2001); Esophagogastroduodenoscopy; Cholecystectomy (mid 90's); Colonoscopy; Hernia repair; Endarterectomy (06/02/2012); Patch angioplasty (06/02/2012); Colon surgery; and Cardiac catheterization (Right, 12/03/2015).  Family History: His family history includes Clotting disorder in his father; Deep vein thrombosis in his father; Diabetes in his father and sister; Hyperlipidemia in his brother, father, mother, and sister; Hypertension in his brother, father, mother, and sister; Other in his mother and sister; Varicose Veins in his sister.  Social History: He  reports that he quit smoking about 18 years ago. He quit smokeless tobacco use about 18 years ago. He reports that he does not  drink alcohol or use drugs.  Medications: Allergies as of 03/18/2018      Reactions   Plavix [clopidogrel Bisulfate] Other (See Comments)   GI Bleed   Uloric [febuxostat]       Medication List        Accurate as of 03/18/18  1:47 PM. Always use your most recent med list.          allopurinol 100 MG tablet Commonly known as:   ZYLOPRIM Take 2 tablets (200 mg total) daily by mouth.   amLODipine-atorvastatin 10-40 MG tablet Commonly known as:  CADUET Take 1 tablet by mouth daily.   aspirin 81 MG tablet Take 81 mg by mouth 2 (two) times daily with a meal.   metoprolol tartrate 50 MG tablet Commonly known as:  LOPRESSOR Take 1.5 tablets (75 mg total) 2 (two) times daily by mouth.   pantoprazole 40 MG tablet Commonly known as:  PROTONIX TAKE 1 TABLET DAILY WITH BREAKFAST

## 2018-03-18 NOTE — Patient Instructions (Signed)
Can look up CPAP mask options on line  Follow up in 1 year

## 2018-04-01 DIAGNOSIS — I129 Hypertensive chronic kidney disease with stage 1 through stage 4 chronic kidney disease, or unspecified chronic kidney disease: Secondary | ICD-10-CM | POA: Diagnosis not present

## 2018-05-26 ENCOUNTER — Other Ambulatory Visit: Payer: Self-pay | Admitting: Family Medicine

## 2018-06-04 ENCOUNTER — Other Ambulatory Visit: Payer: Self-pay | Admitting: Family Medicine

## 2018-06-06 ENCOUNTER — Other Ambulatory Visit: Payer: Self-pay

## 2018-06-06 ENCOUNTER — Encounter: Payer: Self-pay | Admitting: Family

## 2018-06-06 ENCOUNTER — Ambulatory Visit (HOSPITAL_COMMUNITY)
Admission: RE | Admit: 2018-06-06 | Discharge: 2018-06-06 | Disposition: A | Discharge status: Home / Self Care | Payer: Medicare Other | Source: Ambulatory Visit | Attending: Family | Admitting: Family

## 2018-06-06 ENCOUNTER — Ambulatory Visit (INDEPENDENT_AMBULATORY_CARE_PROVIDER_SITE_OTHER): Payer: Medicare Other | Admitting: Family

## 2018-06-06 VITALS — BP 172/82 | HR 53 | Temp 97.5°F | Resp 16 | Ht 70.0 in | Wt 187.0 lb

## 2018-06-06 DIAGNOSIS — I6523 Occlusion and stenosis of bilateral carotid arteries: Secondary | ICD-10-CM | POA: Diagnosis not present

## 2018-06-06 DIAGNOSIS — Z9889 Other specified postprocedural states: Secondary | ICD-10-CM | POA: Diagnosis not present

## 2018-06-06 DIAGNOSIS — I701 Atherosclerosis of renal artery: Secondary | ICD-10-CM

## 2018-06-06 NOTE — Progress Notes (Signed)
Chief Complaint: Follow up Extracranial Carotid Artery Stenosis and renal artery stenosis    History of Present Illness  James [name] is a 71 y.o. male who is status post left carotid endarterectomy for asymptomatic stenosis in November 2013. He is status post right carotid endarterectomy in 2008. In 2009 he underwent a right renal artery stenting secondary to elevated creatinine and blood pressure. His creatinine has remained stable. He does suffer from whitecoat hypertension. He was identified to have a progressive stenosis within his right renal artery stent and on 12/03/2015 he underwent renal angiography. 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 The right kidney was perfused via an accessory vessel which did appear to opacify the main renal artery.   Dr. Trula Slade last evaluated pt on 10-18-17. At that time, in regards to hypertension: Dr. Trula Slade showed the patient pictures of his occluded right renal artery stent and the accessory renal that maintains perfusion to the right kidney.  Dr. Trula Slade did not feel there was any role for nephrectomy at that time. He encouraged the patient to continue to stay hydrated and minimize caffeine intake; also discussed the importance of regular exercise, weight loss, and better nutrition to help with his blood pressure. In regards to carotid artery stenosis: Dr. Trula Slade scheduled pt to follow-up later in 2019 with a carotid duplex.  He denies any hx of stroke or TIA. He denies any claudication type symptoms in his legs with walking.   Diabetic: no Tobacco use: former smoker, quit in 2001, started at about age 67 years  Pt meds include: Statin : yes ASA: yes Other anticoagulants/antiplatelets: no   Past Medical History:  Diagnosis Date  . Arthritis   . Carotid artery occlusion   . DVT (deep venous thrombosis) ([place]) 2008  . DVT (deep venous thrombosis) ([place])   . GERD (gastroesophageal reflux disease)    takes  Protonix daily  . Gout    takes Allopurinol daily and Indomethacin prn  . Gout flare   . H/O renal artery stenosis   . H/O: GI bleed   . History of blood transfusion   . History of colon polyps   . History of gastric ulcer   . Hyperlipidemia    takes Lipitor daily  . Hypertension    takes Metoprolol and Amlodipine daily  . Joint pain   . Joint swelling     Social History Social History   Tobacco Use  . Smoking status: Former Smoker    Last attempt to quit: 01/02/2000    Years since quitting: 18.4  . Smokeless tobacco: Former Systems developer    Quit date: 12/26/1999  Substance Use Topics  . Alcohol use: No    Comment: Quit in 1998  . Drug use: No    Family History Family History  Problem Relation Age of Onset  . Hypertension Mother   . Hyperlipidemia Mother   . Other Mother        varicose veins  . Hyperlipidemia Father   . Hypertension Father   . Diabetes Father   . Clotting disorder Father   . Deep vein thrombosis Father   . Hypertension Sister   . Diabetes Sister   . Hyperlipidemia Sister   . Varicose Veins Sister   . Other Sister        Bleeding problems  . Hypertension Brother   . Hyperlipidemia Brother     Surgical History Past Surgical History:  Procedure Laterality Date  . CAROTID ENDARTERECTOMY  2008  right CEA  . CHOLECYSTECTOMY  mid 90's  . COLON SURGERY    . COLONOSCOPY    . ENDARTERECTOMY  06/02/2012   Procedure: ENDARTERECTOMY CAROTID;  Surgeon: Serafina Mitchell, MD;  Location: [facility] OR;  Service: Vascular;  Laterality: Left;  . ESOPHAGOGASTRODUODENOSCOPY    . HERNIA REPAIR     umbilical hernia  . PATCH ANGIOPLASTY  06/02/2012   Procedure: PATCH ANGIOPLASTY;  Surgeon: Serafina Mitchell, MD;  Location: [facility];  Service: Vascular;  Laterality: Left;  using Vascu-Guard Patch  . PERIPHERAL VASCULAR CATHETERIZATION Right 12/03/2015   Procedure: Peripheral Vascular Balloon Angioplasty;  Surgeon: Serafina Mitchell, MD;  Location: [facility];  Service:  Cardiovascular;  Laterality: Right;  RENAL unsuccessful  . RENAL ARTERY STENT  2009   Right renal artery stenting-  Right Kidney  . small intestine removed  2001   d/t random gi bleeding    Allergies  Allergen Reactions  . Plavix [Clopidogrel Bisulfate] Other (See Comments)    GI Bleed  . Uloric [Febuxostat]     Current Outpatient Medications  Medication Sig Dispense Refill  . allopurinol (ZYLOPRIM) 100 MG tablet TAKE 2 TABLETS DAILY 180 tablet 0  . amLODipine-atorvastatin (CADUET) 10-40 MG tablet Take 1 tablet by mouth daily. 90 tablet 1  . aspirin 81 MG tablet Take 81 mg by mouth 2 (two) times daily with a meal.     . Cholecalciferol (VITAMIN D3) 50 MCG (2000 UT) TABS Take by mouth.    . hydrALAZINE (APRESOLINE) 25 MG tablet Take 25 mg by mouth 3 (three) times daily.  3  . lisinopril (PRINIVIL,ZESTRIL) 10 MG tablet Take 10 mg by mouth daily.  3  . metoprolol tartrate (LOPRESSOR) 50 MG tablet TAKE ONE AND ONE-HALF TABLETS (75 MG) TWICE A DAY (DOSE CHANGE TO 75 MG TWICE A DAY) 270 tablet 0  . pantoprazole (PROTONIX) 40 MG tablet TAKE 1 TABLET DAILY WITH BREAKFAST 90 tablet 0   No current facility-administered medications for this visit.     Review of Systems : See HPI for pertinent positives and negatives.  Physical Examination  Vitals:   06/06/18 1451 06/06/18 1453  BP: (!) 149/69 (!) 172/82  Pulse: (!) 53   Resp: 16   Temp: (!) 97.5 F (36.4 C)   TempSrc: Oral   SpO2: 98%   Weight: 187 lb (84.8 kg)   Height: 5\' 10"  (1.778 m)    Body mass index is 26.83 kg/m.  General: WDWN male in NAD GAIT: normal Eyes: PERRLA HENT: No gross abnormalities.  Pulmonary:  Respirations are non-labored, good air movement in all fields, CTAB, no rales, rhonchi, or wheezes. Cardiac: regular rhythm, no detected murmur.  VASCULAR EXAM Carotid Bruits Right Left   Negative Negative     Abdominal aortic pulse is not palpable. Radial pulses are 2+ palpable and equal.  LE Pulses Right Left       POPLITEAL  not palpable   not palpable       POSTERIOR TIBIAL   palpable    palpable        DORSALIS PEDIS      ANTERIOR TIBIAL not palpable  not palpable     Gastrointestinal: soft, nontender, BS WNL, no r/g, no palpable masses. Musculoskeletal: no muscle atrophy/wasting. M/S 5/5 throughout, extremities without ischemic changes. Skin: No rashes, no ulcers, no cellulitis.   Neurologic:  A&O X 3; appropriate affect, sensation is normal; speech is normal, CN 2-12 intact, pain and light touch intact in extremities, motor exam as listed above. Psychiatric: Normal thought content, mood appropriate to clinical situation.    Assessment: CHACE KLIPPEL is a 72 y.o. male who is status post left carotid endarterectomy for asymptomatic stenosis in November 2013. He is status post right carotid endarterectomy in 2008.  In 2009 he underwent a right renal artery stenting secondary to elevated creatinine and blood pressure. His creatinine has remained stable. He does suffer from whitecoat hypertension. At home he states his blood pressure is 120's/60's.  He was identified to have a progressive stenosis within his right renal artery stent and on 12/03/2015 he underwent renal angiography. 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 The right kidney was perfused via an accessory vessel which did appear to opacify the main renal artery.    DATA Carotid Duplex (06-06-18): Bilateral ICA (bilateral CEA sites) with 1-39% stenosis Bilateral vertebral artery flow is antegrade.  Bilateral subclavian artery waveforms are normal.  No change from the exam on 06-22-16.    Plan: Follow-up in 1 year with Carotid Duplex scan.   I discussed in depth with the patient the nature of atherosclerosis, and emphasized the importance of maximal medical  management including strict control of blood pressure, blood glucose, and lipid levels, obtaining regular exercise, and continued cessation of smoking.  The patient is aware that without maximal medical management the underlying atherosclerotic disease process will progress, limiting the benefit of any interventions. The patient was given information about stroke prevention and what symptoms should prompt the patient to seek immediate medical care. Thank you for allowing [place] to participate in this patient's care.  James Chambers, RN, MSN, FNP-C [facility] Office: [phone]  Clinic Physician: Trula Slade  06/06/18 4:33 PM

## 2018-06-06 NOTE — Patient Instructions (Signed)
Stroke Prevention Some health problems and behaviors may make it more likely for you to have a stroke. Below are ways to lessen your risk of having a stroke.  Be active for at least 30 minutes on most or all days.  Do not smoke. Try not to be around others who smoke.  Do not drink too much alcohol. ? Do not have more than 2 drinks a day if you are a man. ? Do not have more than 1 drink a day if you are a woman and are not pregnant.  Eat healthy foods, such as fruits and vegetables. If you were put on a specific diet, follow the diet as told.  Keep your cholesterol levels under control through diet and medicines. Look for foods that are low in saturated fat, trans fat, cholesterol, and are high in fiber.  If you have diabetes, follow all diet plans and take your medicine as told.  Ask your doctor if you need treatment to lower your blood pressure. If you have high blood pressure (hypertension), follow all diet plans and take your medicine as told by your doctor.  If you are 69-2 years old, have your blood pressure checked every 3-5 years. If you are age 42 or older, have your blood pressure checked every year.  Keep a healthy weight. Eat foods that are low in calories, salt, saturated fat, trans fat, and cholesterol.  Do not take drugs.  Avoid birth control pills, if this applies. Talk to your doctor about the risks of taking birth control pills.  Talk to your doctor if you have sleep problems (sleep apnea).  Take all medicine as told by your doctor. ? You may be told to take aspirin or blood thinner medicine. Take this medicine as told by your doctor. ? Understand your medicine instructions.  Make sure any other conditions you have are being taken care of.  Get help right away if:  You suddenly lose feeling (you feel numb) or have weakness in your face, arm, or leg.  Your face or eyelid hangs down to one side.  You suddenly feel confused.  You have trouble talking  (aphasia) or understanding what people are saying.  You suddenly have trouble seeing in one or both eyes.  You suddenly have trouble walking.  You are dizzy.  You lose your balance or your movements are clumsy (uncoordinated).  You suddenly have a very bad headache and you do not know the cause.  You have new chest pain.  Your heart feels like it is fluttering or skipping a beat (irregular heartbeat). Do not wait to see if the symptoms above go away. Get help right away. Call your local emergency services (911 in [place] Do not drive yourself to the hospital. This information is not intended to replace advice given to you by your health care provider. Make sure you discuss any questions you have with your health care provider. Document Released: 01/12/2012 Document Revised: 12/19/2015 Document Reviewed: 01/13/2013 Elsevier Interactive Patient Education  2018 [employer].     Hypertension Hypertension is another name for high blood pressure. High blood pressure forces your heart to work harder to pump blood. This can cause problems over time. There are two numbers in a blood pressure reading. There is a top number (systolic) over a bottom number (diastolic). It is best to have a blood pressure below 120/80. Healthy choices can help lower your blood pressure. You may need medicine to help lower your blood pressure if:  Your blood pressure cannot be lowered with healthy choices.  Your blood pressure is higher than 130/80.  Follow these instructions at home: Eating and drinking  If directed, follow the DASH eating plan. This diet includes: ? Filling half of your plate at each meal with fruits and vegetables. ? Filling one quarter of your plate at each meal with whole grains. Whole grains include whole wheat pasta, brown rice, and whole grain bread. ? Eating or drinking low-fat dairy products, such as skim milk or low-fat yogurt. ? Filling one quarter of your plate at each meal with  low-fat (lean) proteins. Low-fat proteins include fish, skinless chicken, eggs, beans, and tofu. ? Avoiding fatty meat, cured and processed meat, or chicken with skin. ? Avoiding premade or processed food.  Eat less than 1,500 mg of salt (sodium) a day.  Limit alcohol use to no more than 1 drink a day for nonpregnant women and 2 drinks a day for men. One drink equals 12 oz of beer, 5 oz of wine, or 1 oz of hard liquor. Lifestyle  Work with your doctor to stay at a healthy weight or to lose weight. Ask your doctor what the best weight is for you.  Get at least 30 minutes of exercise that causes your heart to beat faster (aerobic exercise) most days of the week. This may include walking, swimming, or biking.  Get at least 30 minutes of exercise that strengthens your muscles (resistance exercise) at least 3 days a week. This may include lifting weights or pilates.  Do not use any products that contain nicotine or tobacco. This includes cigarettes and e-cigarettes. If you need help quitting, ask your doctor.  Check your blood pressure at home as told by your doctor.  Keep all follow-up visits as told by your doctor. This is important. Medicines  Take over-the-counter and prescription medicines only as told by your doctor. Follow directions carefully.  Do not skip doses of blood pressure medicine. The medicine does not work as well if you skip doses. Skipping doses also puts you at risk for problems.  Ask your doctor about side effects or reactions to medicines that you should watch for. Contact a doctor if:  You think you are having a reaction to the medicine you are taking.  You have headaches that keep coming back (recurring).  You feel dizzy.  You have swelling in your ankles.  You have trouble with your vision. Get help right away if:  You get a very bad headache.  You start to feel confused.  You feel weak or numb.  You feel faint.  You get very bad pain in  your: ? Chest. ? Belly (abdomen).  You throw up (vomit) more than once.  You have trouble breathing. Summary  Hypertension is another name for high blood pressure.  Making healthy choices can help lower blood pressure. If your blood pressure cannot be controlled with healthy choices, you may need to take medicine. This information is not intended to replace advice given to you by your health care provider. Make sure you discuss any questions you have with your health care provider. Document Released: 12/30/2007 Document Revised: 06/10/2016 Document Reviewed: 06/10/2016 Elsevier Interactive Patient Education  [employer].

## 2018-06-07 IMAGING — DX DG CHEST 2V
2 series · 2 of 2 positions shown · non-contrast
Comparison: Chest x-ray of June 02, 2014

CLINICAL DATA: Routine physical examination. History of peripheral
vascular disease, hypertension, former smoker.

EXAM:
CHEST  2 VIEW

[chest pa]
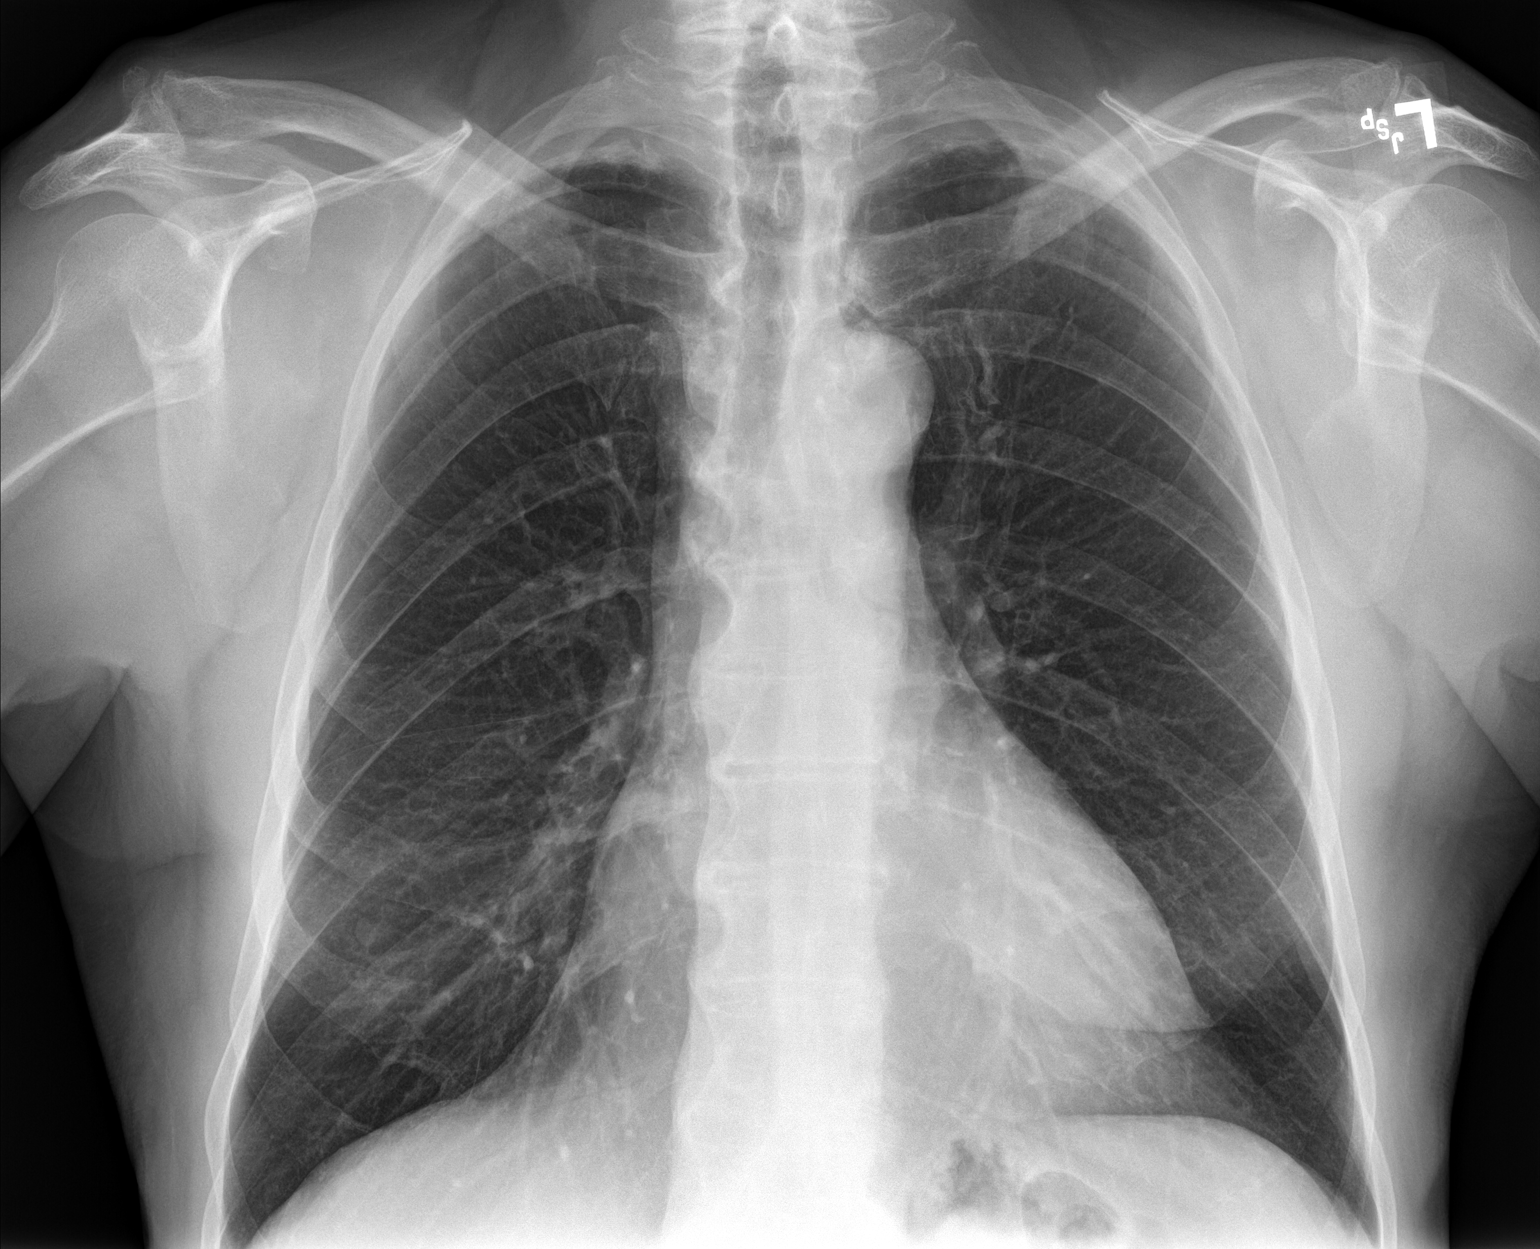

[chest lat]
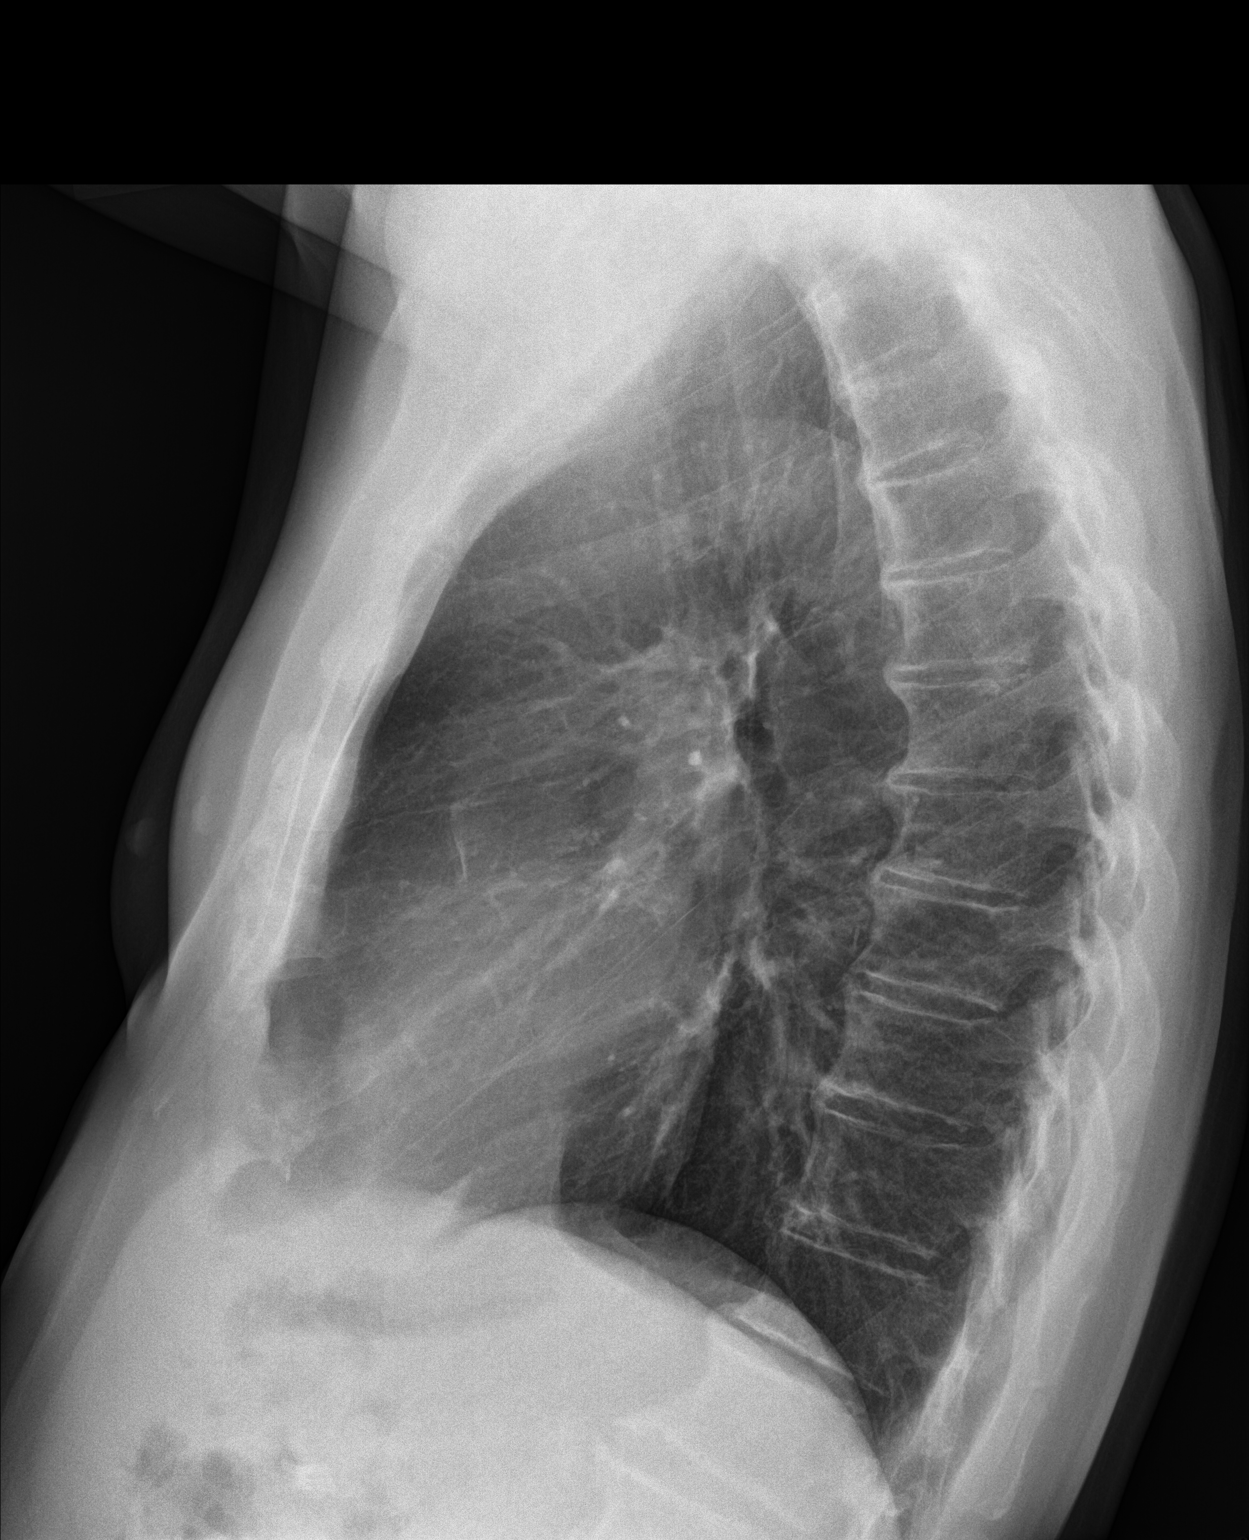

[2 of 2 positions shown; findings below may reference images not displayed]

FINDINGS: The lungs are well-expanded and clear. There is stable biapical
pleural thickening. The heart is top-normal in size. The pulmonary
vascularity is normal. The mediastinum is normal in width. There is
calcification involving the anterior wall of the ascending thoracic
aorta. There is multilevel degenerative disc disease of the thoracic
spine.
IMPRESSION: Stable chronic bronchitic -smoking related changes. No acute
cardiopulmonary abnormality.

Thoracic aortic atherosclerosis.

## 2018-06-20 DIAGNOSIS — N183 Chronic kidney disease, stage 3 (moderate): Secondary | ICD-10-CM | POA: Diagnosis not present

## 2018-06-29 DIAGNOSIS — I701 Atherosclerosis of renal artery: Secondary | ICD-10-CM | POA: Diagnosis not present

## 2018-06-29 DIAGNOSIS — Z23 Encounter for immunization: Secondary | ICD-10-CM | POA: Diagnosis not present

## 2018-06-29 DIAGNOSIS — N184 Chronic kidney disease, stage 4 (severe): Secondary | ICD-10-CM | POA: Diagnosis not present

## 2018-06-29 DIAGNOSIS — I129 Hypertensive chronic kidney disease with stage 1 through stage 4 chronic kidney disease, or unspecified chronic kidney disease: Secondary | ICD-10-CM | POA: Diagnosis not present

## 2018-07-03 ENCOUNTER — Other Ambulatory Visit: Payer: Self-pay | Admitting: Family Medicine

## 2018-07-03 DIAGNOSIS — I159 Secondary hypertension, unspecified: Secondary | ICD-10-CM

## 2018-07-05 ENCOUNTER — Other Ambulatory Visit: Payer: Self-pay | Admitting: Nephrology

## 2018-07-05 DIAGNOSIS — I701 Atherosclerosis of renal artery: Secondary | ICD-10-CM

## 2018-07-05 DIAGNOSIS — I129 Hypertensive chronic kidney disease with stage 1 through stage 4 chronic kidney disease, or unspecified chronic kidney disease: Secondary | ICD-10-CM

## 2018-07-05 DIAGNOSIS — N184 Chronic kidney disease, stage 4 (severe): Secondary | ICD-10-CM

## 2018-07-15 ENCOUNTER — Ambulatory Visit
Admission: RE | Admit: 2018-07-15 | Discharge: 2018-07-15 | Disposition: A | Discharge status: Home / Self Care | Payer: Medicare Other | Source: Ambulatory Visit | Attending: Nephrology | Admitting: Nephrology

## 2018-07-15 DIAGNOSIS — N281 Cyst of kidney, acquired: Secondary | ICD-10-CM | POA: Diagnosis not present

## 2018-07-15 DIAGNOSIS — N184 Chronic kidney disease, stage 4 (severe): Secondary | ICD-10-CM

## 2018-07-15 DIAGNOSIS — I701 Atherosclerosis of renal artery: Secondary | ICD-10-CM | POA: Diagnosis not present

## 2018-07-15 DIAGNOSIS — I129 Hypertensive chronic kidney disease with stage 1 through stage 4 chronic kidney disease, or unspecified chronic kidney disease: Secondary | ICD-10-CM

## 2018-07-25 ENCOUNTER — Other Ambulatory Visit: Payer: Self-pay | Admitting: Nephrology

## 2018-07-25 DIAGNOSIS — N281 Cyst of kidney, acquired: Secondary | ICD-10-CM

## 2018-07-29 ENCOUNTER — Other Ambulatory Visit: Payer: Medicare Other

## 2018-08-04 DIAGNOSIS — N184 Chronic kidney disease, stage 4 (severe): Secondary | ICD-10-CM | POA: Diagnosis not present

## 2018-08-06 ENCOUNTER — Ambulatory Visit
Admission: RE | Admit: 2018-08-06 | Discharge: 2018-08-06 | Disposition: A | Discharge status: Home / Self Care | Payer: Medicare Other | Source: Ambulatory Visit | Attending: Nephrology | Admitting: Nephrology

## 2018-08-06 DIAGNOSIS — N281 Cyst of kidney, acquired: Secondary | ICD-10-CM

## 2018-08-06 DIAGNOSIS — N271 Small kidney, bilateral: Secondary | ICD-10-CM | POA: Diagnosis not present

## 2018-08-11 DIAGNOSIS — N401 Enlarged prostate with lower urinary tract symptoms: Secondary | ICD-10-CM | POA: Diagnosis not present

## 2018-08-11 DIAGNOSIS — N183 Chronic kidney disease, stage 3 (moderate): Secondary | ICD-10-CM | POA: Diagnosis not present

## 2018-08-11 DIAGNOSIS — N281 Cyst of kidney, acquired: Secondary | ICD-10-CM | POA: Diagnosis not present

## 2018-08-11 DIAGNOSIS — R3915 Urgency of urination: Secondary | ICD-10-CM | POA: Diagnosis not present

## 2018-08-12 DIAGNOSIS — N184 Chronic kidney disease, stage 4 (severe): Secondary | ICD-10-CM | POA: Diagnosis not present

## 2018-08-12 DIAGNOSIS — N281 Cyst of kidney, acquired: Secondary | ICD-10-CM | POA: Diagnosis not present

## 2018-08-12 DIAGNOSIS — I701 Atherosclerosis of renal artery: Secondary | ICD-10-CM | POA: Diagnosis not present

## 2018-08-12 DIAGNOSIS — I129 Hypertensive chronic kidney disease with stage 1 through stage 4 chronic kidney disease, or unspecified chronic kidney disease: Secondary | ICD-10-CM | POA: Diagnosis not present

## 2018-08-24 ENCOUNTER — Other Ambulatory Visit: Payer: Self-pay | Admitting: *Deleted

## 2018-08-24 ENCOUNTER — Other Ambulatory Visit: Payer: Self-pay | Admitting: Family Medicine

## 2018-08-24 MED ORDER — METOPROLOL TARTRATE 50 MG PO TABS: ORAL_TABLET | ORAL | 0 refills | Status: DC

## 2018-08-24 MED ORDER — ALLOPURINOL 100 MG PO TABS: 200.0000 mg | ORAL_TABLET | Freq: Every day | ORAL | 0 refills | Status: DC

## 2018-08-24 NOTE — Telephone Encounter (Signed)
Last seen 01/21/18 

## 2018-09-05 ENCOUNTER — Encounter: Payer: Self-pay | Admitting: Family Medicine

## 2018-09-05 ENCOUNTER — Ambulatory Visit (INDEPENDENT_AMBULATORY_CARE_PROVIDER_SITE_OTHER): Payer: Medicare Other | Admitting: Family Medicine

## 2018-09-05 VITALS — BP 184/81 | HR 74 | Temp 97.4°F | Ht 70.0 in | Wt 199.2 lb

## 2018-09-05 DIAGNOSIS — I159 Secondary hypertension, unspecified: Secondary | ICD-10-CM

## 2018-09-05 DIAGNOSIS — H1013 Acute atopic conjunctivitis, bilateral: Secondary | ICD-10-CM | POA: Diagnosis not present

## 2018-09-05 DIAGNOSIS — N183 Chronic kidney disease, stage 3 unspecified: Secondary | ICD-10-CM

## 2018-09-05 MED ORDER — OLOPATADINE HCL 0.1 % OP SOLN: 1.0000 [drp] | Freq: Two times a day (BID) | OPHTHALMIC | 12 refills | Status: DC

## 2018-09-05 NOTE — Progress Notes (Signed)
BP (!) 184/81   Pulse 74   Temp (!) 97.4 F (36.3 C) (Oral)   Ht 5\' 10"  (1.778 m)   Wt 90.4 kg   BMI 28.58 kg/m    Subjective:    Patient ID: James [name], male    DOB: 1946/02/06, 73 y.o.   MRN: [id number]  HPI: James [name] is a 73 y.o. male presenting on 09/05/2018 for Hypertension (check up of chronic medical conditions)  Hypertension Patient is coming in for hypertension. Today his BP is 192/90 and 184/81 on reassessment. He has been checking at home and getting 170s over 60-70s. This has been elevated for the last couple months. Patient has history of renal artery stenosis and stenting. He sees [facility], and gets labs done there. They recently increased his Hydralazine from 25mg  to 100mg  TID. He continues to take Amlodipine 10mg  and Metoprolol 75mg  BID. Patient reports no problems with the medication, although he does complain of his eyes watering more than usual for the past few months.  His eyes have been red and irritated and he complains of more at night.  Patient says he has having some issues with the CPAP off and off at night and irritated his eyes and getting bags under his eyes as well.  Relevant past medical, surgical, family and social history reviewed and updated as indicated. Interim medical history since our last visit reviewed. Allergies and medications reviewed and updated.  Review of Systems  Constitutional: Negative for fatigue and fever.  HENT: Negative.   Eyes: Positive for discharge (clear, bilateral). Negative for photophobia and pain.  Respiratory: Negative for cough and shortness of breath.   Cardiovascular: Negative for chest pain.  Neurological: Negative for dizziness, weakness and headaches.    Per HPI unless specifically indicated above   Allergies as of 09/05/2018      Reactions   Plavix [clopidogrel Bisulfate] Other (See Comments)   GI Bleed   Uloric [febuxostat]       Medication List       Accurate as of September 05, 2018 11:45 AM. Always use your most recent med list.        allopurinol 100 MG tablet Commonly known as:  ZYLOPRIM Take 2 tablets (200 mg total) by mouth daily.   amLODipine-atorvastatin 10-40 MG tablet Commonly known as:  CADUET TAKE 1 TABLET DAILY   aspirin 81 MG tablet Take 81 mg by mouth 2 (two) times daily with a meal.   hydrALAZINE 100 MG tablet Commonly known as:  APRESOLINE Take 1 tablet by mouth 3 (three) times daily.   metoprolol tartrate 50 MG tablet Commonly known as:  LOPRESSOR TAKE ONE AND ONE-HALF TABLETS (75 MG) TWICE A DAY (DOSE CHANGE TO 75 MG TWICE A DAY)   olopatadine 0.1 % ophthalmic solution Commonly known as:  PATANOL Place 1 drop into both eyes 2 (two) times daily.   Vitamin D3 50 MCG (2000 UT) Tabs Take by mouth.          Objective:    BP (!) 184/81   Pulse 74   Temp (!) 97.4 F (36.3 C) (Oral)   Ht 5\' 10"  (1.778 m)   Wt 90.4 kg   BMI 28.58 kg/m   Wt Readings from Last 3 Encounters:  09/05/18 90.4 kg  06/06/18 84.8 kg  03/18/18 81.8 kg    Physical Exam Constitutional:      General: He is not in acute distress.    Appearance: Normal appearance.  Eyes:  General: No scleral icterus.       Right eye: Discharge (clear) present.        Left eye: Discharge (clear) present.    Conjunctiva/sclera:     Right eye: Right conjunctiva is injected.     Left eye: Left conjunctiva is injected.     Pupils: Pupils are equal, round, and reactive to light.  Neck:     Thyroid: No thyromegaly.  Cardiovascular:     Rate and Rhythm: Normal rate and regular rhythm.     Heart sounds: Normal heart sounds.  Pulmonary:     Breath sounds: Normal breath sounds.  Musculoskeletal:     Right lower leg: Edema present.     Left lower leg: Edema present.        Assessment & Plan:   - Use Olapatadine eye drops for watering eyes - Follow up with [facility] regarding HTN due to renal artery stenosis  Problem List Items Addressed This Visit        Cardiovascular and Mediastinum   Accelerated secondary hypertension - Primary   Relevant Medications   hydrALAZINE (APRESOLINE) 100 MG tablet     Genitourinary   Chronic kidney disease (CKD), stage III (moderate) (HCC)    Other Visit Diagnoses    Allergic conjunctivitis of both eyes       Relevant Medications   olopatadine (PATANOL) 0.1 % ophthalmic solution       Follow up plan: Return in about 3 months (around 12/04/2018), or if symptoms worsen or fail to improve, for Hypertension.  Counseling provided for all of the vaccine components No orders of the defined types were placed in this encounter.   [place], PA-S [facility] 09/05/2018, 11:11 AM  Patient seen and examined with PA student, agree with assessment and plan above.  For the hypertension we are going to continue to defer to his nephrologist who is managing his CKD and his renal artery stenosis with previous stents as this is been affecting a lot of his blood pressure issues.  He had just recently increased his hydralazine up to 100 mg 3 times daily within the past couple weeks.  Patient says his home blood pressures are still running up some in the 160s but it has improved from the 180s which is where they were running. Caryl Pina, MD [facility] 09/05/2018, 12:53 PM

## 2018-09-08 DIAGNOSIS — I129 Hypertensive chronic kidney disease with stage 1 through stage 4 chronic kidney disease, or unspecified chronic kidney disease: Secondary | ICD-10-CM | POA: Diagnosis not present

## 2018-09-19 ENCOUNTER — Ambulatory Visit (INDEPENDENT_AMBULATORY_CARE_PROVIDER_SITE_OTHER): Payer: Medicare Other

## 2018-09-19 VITALS — BP 164/72 | HR 62 | Temp 97.1°F | Ht 70.0 in | Wt 196.0 lb

## 2018-09-19 DIAGNOSIS — Z0000 Encounter for general adult medical examination without abnormal findings: Secondary | ICD-10-CM | POA: Diagnosis not present

## 2018-09-19 NOTE — Patient Instructions (Signed)
  James [name] , Thank you for taking time to come for your Medicare Wellness Visit. I appreciate your ongoing commitment to your health goals. Please review the following plan we discussed and let me know if I can assist you in the future.   These are the goals we discussed: Goals    . Exercise 3x per week (30 min per time)    . Have 3 meals a day    . Have 3 meals a day    . Have 3 meals a day    . Have 3 meals a day       This is a list of the screening recommended for you and due dates:  Health Maintenance  Topic Date Due  . Tetanus Vaccine  04/21/2021  . Colon Cancer Screening  07/10/2027  . Flu Shot  Completed  .  Hepatitis C: One time screening is recommended by Center for Disease Control  (CDC) for  adults born from [birth year] through 1965.   Completed  . Pneumonia vaccines  Completed

## 2018-09-19 NOTE — Progress Notes (Signed)
Subjective:   James [name] is a 73 y.o. male who presents for Medicare Annual/Subsequent preventive examination. He is a very pleasant man who resides locally in [place]. He is retired and has been since 2003 from [employer]. He is also retired from the [employer]. He enjoys working on cars and tractors and target shooting. He enjoys working outside and due to the weather the last few months hasn't been able to do that. He attends church and is a member of the [employer]. He lives with his wife. They have two children. One that lives in [place] and the other in [place], [place]. They do not have any pets and they do have steps in their home. He has no issues with climbing up and down the stairs. He and his wife have an Advanced Directive in place. He is up to date on all health maintenance. Offered the Shingrix vaccine but patient declined at this time.   Review of Systems:   Cardiac Risk Factors include: advanced age (>68men, >30 women);dyslipidemia;male gender;smoking/ tobacco exposure     Objective:    Vitals: BP (!) 164/72   Pulse 62   Temp (!) 97.1 F (36.2 C) (Oral)   Ht 5\' 10"  (1.778 m)   Wt 196 lb (88.9 kg)   BMI 28.12 kg/m   Body mass index is 28.12 kg/m.  Advanced Directives 09/19/2018 06/06/2018 10/18/2017 06/23/2017 12/23/2016 08/31/2016 06/22/2016  Does Patient Have a Medical Advance Directive? Yes Yes Yes Yes No Yes Yes  Type of Advance Directive Living will [place];Living will [place];Living will Living will - [place];Living will Living will  Does patient want to make changes to medical advance directive? No - Patient declined - - - - - -  Copy of Press photographer in [place]  Would patient like information on creating a medical advance directive? - - - - - - -  Pre-existing out of facility DNR order (yellow form or pink MOST form) - - - - - - -     Tobacco Social History   Tobacco Use  Smoking Status Former Smoker  . Last attempt to quit: 01/02/2000  . Years since quitting: 18.7  Smokeless Tobacco Former Systems developer  . Quit date: 12/26/1999     Counseling given: Not Answered   Clinical Intake:  Pre-visit preparation completed: No  Pain : No/denies pain     BMI - recorded: 28.58 Nutritional Status: BMI 25 -29 Overweight Nutritional Risks: None Diabetes: No  How often do you need to have someone help you when you read instructions, pamphlets, or other written materials from your doctor or pharmacy?: 1 - Never What is the last grade level you completed in school?: College graduate  Interpreter Needed?: No  Information entered by :: Theodoro Clock LPN  Past Medical History:  Diagnosis Date  . Arthritis   . Carotid artery occlusion   . DVT (deep venous thrombosis) ([place]) 2008  . DVT (deep venous thrombosis) ([place])   . GERD (gastroesophageal reflux disease)    takes Protonix daily  . Gout    takes Allopurinol daily and Indomethacin prn  . Gout flare   . H/O renal artery stenosis   . H/O: GI bleed   . History of blood transfusion   . History of colon polyps   . History of gastric ulcer   . Hyperlipidemia    takes Lipitor  daily  . Hypertension    takes Metoprolol and Amlodipine daily  . Joint pain   . Joint swelling    Past Surgical History:  Procedure Laterality Date  . CAROTID ENDARTERECTOMY  2008   right CEA  . CHOLECYSTECTOMY  mid 90's  . COLON SURGERY    . COLONOSCOPY    . ENDARTERECTOMY  06/02/2012   Procedure: ENDARTERECTOMY CAROTID;  Surgeon: Serafina Mitchell, MD;  Location: [facility] OR;  Service: Vascular;  Laterality: Left;  . ESOPHAGOGASTRODUODENOSCOPY    . HERNIA REPAIR     umbilical hernia  . PATCH ANGIOPLASTY  06/02/2012   Procedure: PATCH ANGIOPLASTY;  Surgeon: Serafina Mitchell, MD;  Location: [facility];  Service: Vascular;  Laterality: Left;  using Vascu-Guard Patch  . PERIPHERAL VASCULAR CATHETERIZATION  Right 12/03/2015   Procedure: Peripheral Vascular Balloon Angioplasty;  Surgeon: Serafina Mitchell, MD;  Location: [facility] LAB;  Service: Cardiovascular;  Laterality: Right;  RENAL unsuccessful  . RENAL ARTERY STENT  2009   Right renal artery stenting-  Right Kidney  . small intestine removed  2001   d/t random gi bleeding   Family History  Problem Relation Age of Onset  . Hypertension Mother   . Hyperlipidemia Mother   . Other Mother        varicose veins  . Hyperlipidemia Father   . Hypertension Father   . Diabetes Father   . Clotting disorder Father   . Deep vein thrombosis Father   . Hypertension Sister   . Diabetes Sister   . Hyperlipidemia Sister   . Varicose Veins Sister   . Other Sister        Bleeding problems  . Hypertension Brother   . Hyperlipidemia Brother    Social History   Socioeconomic History  . Marital status: Married    Spouse name: Not on file  . Number of children: 2  . Years of education: Not on file  . Highest education level: Associate degree: occupational, Hotel manager, or vocational program  Occupational History  . Occupation: Retired  Scientific laboratory technician  . Financial resource strain: Not hard at all  . Food insecurity:    Worry: Never true    Inability: Never true  . Transportation needs:    Medical: No    Non-medical: No  Tobacco Use  . Smoking status: Former Smoker    Last attempt to quit: 01/02/2000    Years since quitting: 18.7  . Smokeless tobacco: Former Systems developer    Quit date: 12/26/1999  Substance and Sexual Activity  . Alcohol use: No    Comment: Quit in 1998  . Drug use: No  . Sexual activity: Yes  Lifestyle  . Physical activity:    Days per week: 0 days    Minutes per session: 0 min  . Stress: Not at all  Relationships  . Social connections:    Talks on phone: More than three times a week    Gets together: More than three times a week    Attends religious service: Not on file    Active member of club or organization: Yes     Attends meetings of clubs or organizations: More than 4 times per year    Relationship status: Married  Other Topics Concern  . Not on file  Social History Narrative  . Not on file    Outpatient Encounter Medications as of 09/19/2018  Medication Sig  . allopurinol (ZYLOPRIM) 100 MG tablet Take 2 tablets (200 mg total)  by mouth daily.  Marland Kitchen amLODipine-atorvastatin (CADUET) 10-40 MG tablet TAKE 1 TABLET DAILY  . aspirin 81 MG tablet Take 81 mg by mouth 2 (two) times daily with a meal.   . Cholecalciferol (VITAMIN D3) 50 MCG (2000 UT) TABS Take by mouth.  . cloNIDine (CATAPRES) 0.1 MG tablet Take 0.1 mg by mouth daily.  . hydrALAZINE (APRESOLINE) 100 MG tablet Take 1 tablet by mouth 3 (three) times daily.  . metoprolol tartrate (LOPRESSOR) 50 MG tablet TAKE ONE AND ONE-HALF TABLETS (75 MG) TWICE A DAY (DOSE CHANGE TO 75 MG TWICE A DAY)  . olopatadine (PATANOL) 0.1 % ophthalmic solution Place 1 drop into both eyes 2 (two) times daily.  . sodium bicarbonate 650 MG tablet Take 650 mg by mouth 2 (two) times daily.   No facility-administered encounter medications on file as of 09/19/2018.     Activities of Daily Living In your present state of health, do you have any difficulty performing the following activities: 09/19/2018  Hearing? N  Vision? N  Difficulty concentrating or making decisions? Y  Walking or climbing stairs? N  Dressing or bathing? N  Doing errands, shopping? N  Preparing Food and eating ? N  Using the Toilet? N  In the past six months, have you accidently leaked urine? N  Do you have problems with loss of bowel control? N  Managing your Medications? N  Managing your Finances? N  Housekeeping or managing your Housekeeping? N  Some recent data might be hidden   Patient states that he does have problems with memory at times but performed well with the MMSE  Patient Care Team: [name], [name], MD as PCP - General (Family Medicine) [name], [name], MD as Consulting  Physician (Family Medicine) Serafina Mitchell, MD as Consulting Physician (Vascular Surgery) Luberta Mutter, MD as Consulting Physician (Ophthalmology) [place], [place]   Assessment:   This is a routine wellness examination for [place].  Exercise Activities and Dietary recommendations Current Exercise Habits: The patient does not participate in regular exercise at present    Fall Risk Fall Risk  09/19/2018 09/05/2018 01/21/2018 10/21/2017 09/30/2017  Falls in the past year? 0 0 No No No  Comment - - - - -   Is the patient's home free of loose throw rugs in walkways, pet beds, electrical cords, etc?   yes      Grab bars in the bathroom? no      Handrails on the stairs?   yes      Adequate lighting?   yes    Depression Screen PHQ 2/9 Scores 09/19/2018 09/05/2018 01/21/2018 10/21/2017  PHQ - 2 Score 1 1 0 0  PHQ- 9 Score - - - -    Cognitive Function MMSE - Mini Mental State Exam 09/19/2018 06/23/2017  Orientation to time 5 4  Orientation to Place 5 5  Registration 3 3  Attention/ Calculation 4 5  Recall 1 2  Language- name 2 objects 2 2  Language- repeat 1 1  Language- follow 3 step command 3 3  Language- read & follow direction 1 1  Write a sentence 1 1  Copy design 1 1  Total score 27 28    Patient had minimal difficulty with MMSE    Immunization History  Administered Date(s) Administered  . Influenza,inj,Quad PF,6+ Mos 06/12/2014, 06/14/2015, 06/09/2017  . Pneumococcal Conjugate-13 06/14/2015  . Pneumococcal Polysaccharide-23 06/09/2017    Qualifies for Shingles Vaccine? Patient declined Shingrix  Screening Tests Health Maintenance  Topic  Date Due  . TETANUS/TDAP  04/21/2021  . COLONOSCOPY  07/10/2027  . INFLUENZA VACCINE  Completed  . Hepatitis C Screening  Completed  . PNA vac Low Risk Adult  Completed   Cancer Screenings: Lung: Low Dose CT Chest recommended if Age 35-80 years, 30 pack-year currently smoking OR have quit w/in 15years. Patient does not  qualify. Colorectal: Due 2/28  Additional Screenings:  Hepatitis C Screening:  Done 03/11/16 with a negative result      Plan:     I have personally reviewed and noted the following in the patient's chart:   . Medical and social history . Use of alcohol, tobacco or illicit drugs  . Current medications and supplements . Functional ability and status . Nutritional status . Physical activity . Advanced directives . List of other physicians . Hospitalizations, surgeries, and ER visits in previous 12 months . Vitals . Screenings to include cognitive, depression, and falls . Referrals and appointments  In addition, I have reviewed and discussed with patient certain preventive protocols, quality metrics, and best practice recommendations. A written personalized care plan for preventive services as well as general preventive health recommendations were provided to patient.     Rolena Infante, LPN  6/56/8127

## 2018-10-14 ENCOUNTER — Encounter: Payer: Self-pay | Admitting: Family Medicine

## 2018-10-14 ENCOUNTER — Ambulatory Visit (INDEPENDENT_AMBULATORY_CARE_PROVIDER_SITE_OTHER): Payer: Medicare Other | Admitting: Family Medicine

## 2018-10-14 ENCOUNTER — Other Ambulatory Visit: Payer: Self-pay

## 2018-10-14 VITALS — BP 180/66 | HR 73 | Temp 97.4°F | Ht 70.0 in | Wt 189.8 lb

## 2018-10-14 DIAGNOSIS — L03116 Cellulitis of left lower limb: Secondary | ICD-10-CM

## 2018-10-14 MED ORDER — SULFAMETHOXAZOLE-TRIMETHOPRIM 800-160 MG PO TABS: 1.0000 | ORAL_TABLET | Freq: Two times a day (BID) | ORAL | 0 refills | Status: DC

## 2018-10-14 NOTE — Progress Notes (Signed)
   BP (!) 180/66   Pulse 73   Temp (!) 97.4 F (36.3 C) (Oral)   Ht 5\' 10"  (1.778 m)   Wt 189 lb 12.8 oz (86.1 kg)   BMI 27.23 kg/m    Subjective:   Patient ID: James [name], male    DOB: May 31, 1946, 73 y.o.   MRN: [id number]  HPI: James [name] is a 73 y.o. male presenting on 10/14/2018 for Foot Swelling (left- Patient states it has been ongoing)   HPI Left foot swelling and redness Patient comes in with complaints of left foot swelling and redness on top of his left foot.  He normally fights swelling issues.  He does complain of feeling of redness and warmth and heat on top of that foot and some extending into his ankle area.  He denies any pain with range of motion but just more pain on top of the skin where he has swelling.  Patient denies any fevers or chills.  He says the swelling has been ongoing but he noticed the redness over the past week.  Relevant past medical, surgical, family and social history reviewed and updated as indicated. Interim medical history since our last visit reviewed. Allergies and medications reviewed and updated.  Review of Systems  Constitutional: Negative for chills and fever.  Respiratory: Negative for shortness of breath and wheezing.   Cardiovascular: Positive for leg swelling. Negative for chest pain.  Musculoskeletal: Negative for back pain and gait problem.  Skin: Positive for color change. Negative for rash.  All other systems reviewed and are negative.   Per HPI unless specifically indicated above  Objective:   BP (!) 180/66   Pulse 73   Temp (!) 97.4 F (36.3 C) (Oral)   Ht 5\' 10"  (1.778 m)   Wt 189 lb 12.8 oz (86.1 kg)   BMI 27.23 kg/m   Wt Readings from Last 3 Encounters:  10/14/18 189 lb 12.8 oz (86.1 kg)  09/19/18 196 lb (88.9 kg)  09/05/18 199 lb 3.2 oz (90.4 kg)    Physical Exam Vitals signs and nursing note reviewed.  Constitutional:      General: He is not in acute distress.    Appearance: He is  well-developed. He is not diaphoretic.  Eyes:     General: No scleral icterus.    Conjunctiva/sclera: Conjunctivae normal.  Musculoskeletal: Normal range of motion.        General: Swelling (3+ pitting edema bilaterally) present.  Skin:    General: Skin is warm and dry.     Findings: No rash.       Neurological:     Mental Status: He is alert and oriented to person, place, and time.     Coordination: Coordination normal.  Psychiatric:        Behavior: Behavior normal.     Assessment & Plan:   Problem List Items Addressed This Visit    None    Visit Diagnoses    Cellulitis of left foot    -  Primary   Relevant Medications   sulfamethoxazole-trimethoprim (BACTRIM DS,SEPTRA DS) 800-160 MG tablet       Follow up plan: Return if symptoms worsen or fail to improve.  Counseling provided for all of the vaccine components No orders of the defined types were placed in this encounter.   Caryl Pina, MD [facility] 10/14/2018, 12:18 PM

## 2018-11-04 ENCOUNTER — Other Ambulatory Visit: Payer: Self-pay | Admitting: Family Medicine

## 2018-11-04 DIAGNOSIS — I159 Secondary hypertension, unspecified: Secondary | ICD-10-CM

## 2018-11-22 ENCOUNTER — Other Ambulatory Visit: Payer: Self-pay | Admitting: Family Medicine

## 2018-11-23 ENCOUNTER — Other Ambulatory Visit: Payer: Self-pay | Admitting: Family Medicine

## 2018-12-05 ENCOUNTER — Other Ambulatory Visit: Payer: Self-pay

## 2018-12-05 ENCOUNTER — Encounter: Payer: Self-pay | Admitting: Family Medicine

## 2018-12-05 ENCOUNTER — Ambulatory Visit (INDEPENDENT_AMBULATORY_CARE_PROVIDER_SITE_OTHER): Payer: Medicare Other | Admitting: Family Medicine

## 2018-12-05 DIAGNOSIS — E785 Hyperlipidemia, unspecified: Secondary | ICD-10-CM

## 2018-12-05 DIAGNOSIS — M109 Gout, unspecified: Secondary | ICD-10-CM

## 2018-12-05 DIAGNOSIS — I159 Secondary hypertension, unspecified: Secondary | ICD-10-CM

## 2018-12-05 DIAGNOSIS — N184 Chronic kidney disease, stage 4 (severe): Secondary | ICD-10-CM | POA: Diagnosis not present

## 2018-12-05 NOTE — Progress Notes (Signed)
Virtual Visit via telephone Note  I connected with James [name] on 12/05/18 at 1059 by telephone and verified that I am speaking with the correct person using two identifiers. James [name] is currently located at home and no other people are currently with her during visit. The provider, [name] [name], MD is located in their office at time of visit.  Call ended at 1111  I discussed the limitations, risks, security and privacy concerns of performing an evaluation and management service by telephone and the availability of in person appointments. I also discussed with the patient that there may be a patient responsible charge related to this service. The patient expressed understanding and agreed to proceed.   History and Present Illness: Hypertension Patient is currently on amlodipine and metoprolol and hydralazine, and their blood pressure today is 155/73, 150/73, 154/70. Patient denies any lightheadedness or dizziness. Patient denies headaches, blurred vision, chest pains, shortness of breath, or weakness. Denies any side effects from medication and is content with current medication.   Hyperlipidemia Patient is coming in for recheck of his hyperlipidemia. The patient is currently taking no medications and we are monitoring. They deny any issues with myalgias or history of liver damage from it. They deny any focal numbness or weakness or chest pain.   Gout recheck Patient takes allopurinol and denies any major issues with gout or the medication.   Outpatient Encounter Medications as of 12/05/2018  Medication Sig  . allopurinol (ZYLOPRIM) 100 MG tablet TAKE 2 TABLETS DAILY  . amLODipine-atorvastatin (CADUET) 10-40 MG tablet TAKE 1 TABLET DAILY  . aspirin 81 MG tablet Take 81 mg by mouth 2 (two) times daily with a meal.   . Cholecalciferol (VITAMIN D3) 50 MCG (2000 UT) TABS Take by mouth.  . cloNIDine (CATAPRES) 0.1 MG tablet Take 0.1 mg by mouth daily.  . hydrALAZINE  (APRESOLINE) 100 MG tablet Take 1 tablet by mouth 3 (three) times daily.  . metoprolol tartrate (LOPRESSOR) 50 MG tablet TAKE ONE AND ONE-HALF TABLETS TWICE A DAY  . olopatadine (PATANOL) 0.1 % ophthalmic solution Place 1 drop into both eyes 2 (two) times daily.  . sodium bicarbonate 650 MG tablet Take 650 mg by mouth 2 (two) times daily.  . [DISCONTINUED] sulfamethoxazole-trimethoprim (BACTRIM DS,SEPTRA DS) 800-160 MG tablet Take 1 tablet by mouth 2 (two) times daily.   No facility-administered encounter medications on file as of 12/05/2018.     Review of Systems  Constitutional: Negative for chills and fever.  Eyes: Negative for visual disturbance.  Respiratory: Negative for shortness of breath and wheezing.   Cardiovascular: Negative for chest pain and leg swelling.  Musculoskeletal: Negative for back pain and gait problem.  Skin: Negative for rash.  Neurological: Negative for dizziness, weakness and light-headedness.  All other systems reviewed and are negative.   Observations/Objective: Patient sounds comfortable and in no acute distress  Assessment and Plan: Problem List Items Addressed This Visit      Cardiovascular and Mediastinum   Accelerated secondary hypertension     Other   Hyperlipidemia LDL goal <100 - Primary   Gout       Follow Up Instructions:  Follow up in 6 months.   I discussed the assessment and treatment plan with the patient. The patient was provided an opportunity to ask questions and all were answered. The patient agreed with the plan and demonstrated an understanding of the instructions.   The patient was advised to call back or seek an in-person  evaluation if the symptoms worsen or if the condition fails to improve as anticipated.  The above assessment and management plan was discussed with the patient. The patient verbalized understanding of and has agreed to the management plan. Patient is aware to call the clinic if symptoms persist or worsen.  Patient is aware when to return to the clinic for a follow-up visit. Patient educated on when it is appropriate to go to the emergency department.    I provided 12 minutes of non-face-to-face time during this encounter.    Worthy Rancher, MD

## 2018-12-12 DIAGNOSIS — R600 Localized edema: Secondary | ICD-10-CM | POA: Diagnosis not present

## 2018-12-12 DIAGNOSIS — I129 Hypertensive chronic kidney disease with stage 1 through stage 4 chronic kidney disease, or unspecified chronic kidney disease: Secondary | ICD-10-CM | POA: Diagnosis not present

## 2018-12-12 DIAGNOSIS — N184 Chronic kidney disease, stage 4 (severe): Secondary | ICD-10-CM | POA: Diagnosis not present

## 2018-12-12 DIAGNOSIS — N281 Cyst of kidney, acquired: Secondary | ICD-10-CM | POA: Diagnosis not present

## 2018-12-12 DIAGNOSIS — I701 Atherosclerosis of renal artery: Secondary | ICD-10-CM | POA: Diagnosis not present

## 2018-12-12 DIAGNOSIS — N2581 Secondary hyperparathyroidism of renal origin: Secondary | ICD-10-CM | POA: Diagnosis not present

## 2018-12-12 DIAGNOSIS — M109 Gout, unspecified: Secondary | ICD-10-CM | POA: Diagnosis not present

## 2018-12-29 DIAGNOSIS — I129 Hypertensive chronic kidney disease with stage 1 through stage 4 chronic kidney disease, or unspecified chronic kidney disease: Secondary | ICD-10-CM | POA: Diagnosis not present

## 2018-12-29 DIAGNOSIS — N184 Chronic kidney disease, stage 4 (severe): Secondary | ICD-10-CM | POA: Diagnosis not present

## 2019-01-10 DIAGNOSIS — H5213 Myopia, bilateral: Secondary | ICD-10-CM | POA: Diagnosis not present

## 2019-01-10 DIAGNOSIS — H2513 Age-related nuclear cataract, bilateral: Secondary | ICD-10-CM | POA: Diagnosis not present

## 2019-01-27 ENCOUNTER — Telehealth: Payer: Self-pay | Admitting: Family Medicine

## 2019-01-27 ENCOUNTER — Telehealth: Payer: Self-pay | Admitting: *Deleted

## 2019-01-27 ENCOUNTER — Other Ambulatory Visit: Payer: Self-pay

## 2019-01-27 ENCOUNTER — Ambulatory Visit
Admission: EM | Admit: 2019-01-27 | Discharge: 2019-01-27 | Disposition: A | Discharge status: Home / Self Care | Payer: Medicare Other | Attending: Emergency Medicine | Admitting: Emergency Medicine

## 2019-01-27 DIAGNOSIS — Z20828 Contact with and (suspected) exposure to other viral communicable diseases: Secondary | ICD-10-CM | POA: Diagnosis not present

## 2019-01-27 DIAGNOSIS — J069 Acute upper respiratory infection, unspecified: Secondary | ICD-10-CM | POA: Diagnosis not present

## 2019-01-27 DIAGNOSIS — Z20822 Contact with and (suspected) exposure to covid-19: Secondary | ICD-10-CM

## 2019-01-27 MED ORDER — CETIRIZINE HCL 5 MG PO TABS: 5.0000 mg | ORAL_TABLET | Freq: Every day | ORAL | 0 refills | Status: DC

## 2019-01-27 MED ORDER — ACETAMINOPHEN 325 MG PO TABS
650.0000 mg | ORAL_TABLET | Freq: Once | ORAL | Status: AC
  Administered 2019-01-27: 650 mg via ORAL

## 2019-01-27 MED ORDER — FLUTICASONE PROPIONATE 50 MCG/ACT NA SUSP: 2.0000 | Freq: Every day | NASAL | 0 refills | Status: DC

## 2019-01-27 NOTE — Telephone Encounter (Signed)
Son is out of town,  Pt wife called son and states that he has  Fever, chills, some SOB (wears a cpap)  Advised: Needs to be tested = son aware, will take him to urgent care in [place].

## 2019-01-27 NOTE — ED Triage Notes (Signed)
Pt has c/o of sinus drainage and also c/o fever 103 at highest past couple of days

## 2019-01-27 NOTE — Telephone Encounter (Signed)
Pt called and scheduled for testing on 01/30/19 at the [facility]. Pt advised to wear a mask and to remain in the car at appt time. Pt verbalized understanding.

## 2019-01-27 NOTE — Discharge Instructions (Addendum)
COVID testing ordered.  Outpatient center will contact you regarding your appointment  In the meantime: You should remain isolated in your home for 7 days from symptom onset AND greater than 72 hours after symptoms resolution (absence of fever without the use of fever-reducing medication and improvement in respiratory symptoms), whichever is longer Get plenty of rest and push fluids Zyrtec prescribed for nasal congestion, runny nose, and/or sore throat Flonase prescribed for nasal congestion and runny nose Use medications daily for symptom relief Use OTC medications like ibuprofen or tylenol as needed fever or pain Follow up with PCP by phone next week for recheck and to ensure your symptoms are improving Call or go to the ED if you have any new or worsening symptoms such as fever, worsening cough, shortness of breath, chest tightness, chest pain, turning blue, changes in mental status, etc..Marland Kitchen

## 2019-01-27 NOTE — ED Provider Notes (Signed)
[place]   [id number] 01/27/19 Arrival Time: 3009   CC: Sinus pain and fever  SUBJECTIVE: History from: patient.  James [name] is a 73 y.o. male hx significant for arthritis, CAO, DVT, GERD, bout, HLD, HTN, H/O of RAS, and GI bleed, who presents with runny nose, congestion, sinus drainage, and fever with tmax of 103 yesteday, x 2 days.  Denies sick exposure to COVID, flu or strep.  Denies recent travel.  Has tried OTC tylenol with relief.  Denies chills, fatigue, sore throat, SOB, wheezing, chest pain, chest pressure, nausea, vomiting, changes in bowel or bladder habits.    ROS: As per HPI.  Past Medical History:  Diagnosis Date   Arthritis    Carotid artery occlusion    DVT (deep venous thrombosis) ([facility]) 2008   DVT (deep venous thrombosis) ([facility])    GERD (gastroesophageal reflux disease)    takes Protonix daily   Gout    takes Allopurinol daily and Indomethacin prn   Gout flare    H/O renal artery stenosis    H/O: GI bleed    History of blood transfusion    History of colon polyps    History of gastric ulcer    Hyperlipidemia    takes Lipitor daily   Hypertension    takes Metoprolol and Amlodipine daily   Joint pain    Joint swelling    Past Surgical History:  Procedure Laterality Date   CAROTID ENDARTERECTOMY  2008   right CEA   CHOLECYSTECTOMY  mid 90's   COLON SURGERY     COLONOSCOPY     ENDARTERECTOMY  06/02/2012   Procedure: ENDARTERECTOMY CAROTID;  Surgeon: Serafina Mitchell, MD;  Location: [facility];  Service: Vascular;  Laterality: Left;   ESOPHAGOGASTRODUODENOSCOPY     HERNIA REPAIR     umbilical hernia   PATCH ANGIOPLASTY  06/02/2012   Procedure: PATCH ANGIOPLASTY;  Surgeon: Serafina Mitchell, MD;  Location: [facility];  Service: Vascular;  Laterality: Left;  using Vascu-Guard Patch   PERIPHERAL VASCULAR CATHETERIZATION Right 12/03/2015   Procedure: Peripheral Vascular Balloon Angioplasty;  Surgeon: Serafina Mitchell, MD;   Location: [facility];  Service: Cardiovascular;  Laterality: Right;  RENAL unsuccessful   RENAL ARTERY STENT  2009   Right renal artery stenting-  Right Kidney   small intestine removed  2001   d/t random gi bleeding   Allergies  Allergen Reactions   Plavix [Clopidogrel Bisulfate] Other (See Comments)    GI Bleed   Uloric [Febuxostat]    No current facility-administered medications on file prior to encounter.    Current Outpatient Medications on File Prior to Encounter  Medication Sig Dispense Refill   allopurinol (ZYLOPRIM) 100 MG tablet TAKE 2 TABLETS DAILY 180 tablet 0   amLODipine-atorvastatin (CADUET) 10-40 MG tablet TAKE 1 TABLET DAILY 90 tablet 1   aspirin 81 MG tablet Take 81 mg by mouth 2 (two) times daily with a meal.      Cholecalciferol (VITAMIN D3) 50 MCG (2000 UT) TABS Take by mouth.     cloNIDine (CATAPRES) 0.1 MG tablet Take 0.1 mg by mouth daily.     hydrALAZINE (APRESOLINE) 100 MG tablet Take 1 tablet by mouth 3 (three) times daily.     metoprolol tartrate (LOPRESSOR) 50 MG tablet TAKE ONE AND ONE-HALF TABLETS TWICE A DAY 270 tablet 0   olopatadine (PATANOL) 0.1 % ophthalmic solution Place 1 drop into both eyes 2 (two) times daily. 5 mL 12  sodium bicarbonate 650 MG tablet Take 650 mg by mouth 2 (two) times daily.     Social History   Socioeconomic History   Marital status: Married    Spouse name: Not on file   Number of children: 2   Years of education: Not on file   Highest education level: Associate degree: occupational, Hotel manager, or vocational program  Occupational History   Occupation: Retired  Scientist, product/process development strain: Not hard at [employer] insecurity    Worry: Never true    Inability: Never true   Transportation needs    Medical: No    Non-medical: No  Tobacco Use   Smoking status: Former Smoker    Quit date: 01/02/2000    Years since quitting: 19.0   Smokeless tobacco: Former Systems developer    Quit date:  12/26/1999  Substance and Sexual Activity   Alcohol use: No    Comment: Quit in 1998   Drug use: No   Sexual activity: Yes  Lifestyle   Physical activity    Days per week: 0 days    Minutes per session: 0 min   Stress: Not at all  Relationships   Social connections    Talks on phone: More than three times a week    Gets together: More than three times a week    Attends religious service: Not on file    Active member of club or organization: Yes    Attends meetings of clubs or organizations: More than 4 times per year    Relationship status: Married   Intimate partner violence    Fear of current or ex partner: No    Emotionally abused: No    Physically abused: No    Forced sexual activity: No  Other Topics Concern   Not on file  Social History Narrative   Not on file   Family History  Problem Relation Age of Onset   Hypertension Mother    Hyperlipidemia Mother    Other Mother        varicose veins   Hyperlipidemia Father    Hypertension Father    Diabetes Father    Clotting disorder Father    Deep vein thrombosis Father    Hypertension Sister    Diabetes Sister    Hyperlipidemia Sister    Varicose Veins Sister    Other Sister        Bleeding problems   Hypertension Brother    Hyperlipidemia Brother     OBJECTIVE:  Vitals:   01/27/19 1310  BP: (!) 183/75  Pulse: 77  Resp: 18  Temp: (!) 101.3 F (38.5 C)  SpO2: 93%     General appearance: alert; well-appearing, nontoxic; speaking in full sentences and tolerating own secretions HEENT: NCAT; Ears: EACs clear, TMs pearly gray; Eyes: PERRL.  EOM grossly intact. Nose: nares patent without rhinorrhea, turbinates swollen, Throat: oropharynx clear, tonsils non erythematous or enlarged, uvula midline  Neck: supple without LAD Lungs: unlabored respirations, symmetrical air entry; cough: absent; no respiratory distress; CTAB Heart: regular rate and rhythm.  Radial pulses 2+ symmetrical  bilaterally Skin: warm and dry Psychological: alert and cooperative; normal mood and affect  ASSESSMENT & PLAN:  1. Viral URI   2. Suspected Covid-19 Virus Infection     Meds ordered this encounter  Medications   acetaminophen (TYLENOL) tablet 650 mg   cetirizine (ZYRTEC) 5 MG tablet    Sig: Take 1 tablet (5 mg total) by mouth daily.  Dispense:  20 tablet    Refill:  0    Order Specific Question:   Supervising Provider    Answer:   Raylene Everts [[id number]]   fluticasone (FLONASE) 50 MCG/ACT nasal spray    Sig: Place 2 sprays into both nostrils daily.    Dispense:  16 g    Refill:  0    Order Specific Question:   Supervising Provider    Answer:   Raylene Everts [[id number]]   COVID testing ordered.  Outpatient center will contact you regarding your appointment  In the meantime: You should remain isolated in your home for 7 days from symptom onset AND greater than 72 hours after symptoms resolution (absence of fever without the use of fever-reducing medication and improvement in respiratory symptoms), whichever is longer Get plenty of rest and push fluids Zyrtec prescribed for nasal congestion, runny nose, and/or sore throat Flonase prescribed for nasal congestion and runny nose Use medications daily for symptom relief Use OTC medications like ibuprofen or tylenol as needed fever or pain Follow up with PCP by phone next week for recheck and to ensure your symptoms are improving Call or go to the ED if you have any new or worsening symptoms such as fever, worsening cough, shortness of breath, chest tightness, chest pain, turning blue, changes in mental status, etc...  Reviewed expectations re: course of current medical issues. Questions answered. Outlined signs and symptoms indicating need for more acute intervention. Patient verbalized understanding. After Visit Summary given.         Lestine Box, PA-C 01/27/19 1444

## 2019-01-27 NOTE — Telephone Encounter (Signed)
-----   Message from [place], [place] sent at 01/27/2019  1:58 PM EDT ----- Regarding: COVID testing Runny nose, congestion, drainage and fever with tmax of 103.  No known COVID, or travel.

## 2019-01-30 ENCOUNTER — Telehealth: Payer: Self-pay | Admitting: Family Medicine

## 2019-01-30 ENCOUNTER — Other Ambulatory Visit: Payer: Medicare Other

## 2019-01-30 DIAGNOSIS — R6889 Other general symptoms and signs: Secondary | ICD-10-CM | POA: Diagnosis not present

## 2019-01-30 DIAGNOSIS — Z20822 Contact with and (suspected) exposure to covid-19: Secondary | ICD-10-CM

## 2019-01-30 DIAGNOSIS — J069 Acute upper respiratory infection, unspecified: Secondary | ICD-10-CM

## 2019-01-30 NOTE — Telephone Encounter (Signed)
Son aware.

## 2019-01-30 NOTE — Telephone Encounter (Signed)
Returned son's phone call.  Patient's son states that patient has had a fever from 100.3 to 103.9 since Friday with taking tylenol every 6 hours.  Son states that temperature has came back down to normal with tylenol and that patient's breathing is better.  Patient being tested for COVID-19 today.  Advised son to alternate between ibuprofen and tylenol and to make sure patient is drinking plenty of fluids and if get worse to go to the ED

## 2019-01-30 NOTE — Telephone Encounter (Signed)
Yes I agree, maintain plenty of fluids and keep the Tylenol going and alternate with ibuprofen when needed if the fever does not come down.  If it stays below 101 then that is doing well.  If he continues to get worse or develops more shortness of breath or anything along that lines the needs to go back to the emergency department.  We will watch and wait for the test results.  Please quarantine until then.  I have ordered the home monitoring for coronavirus. Caryl Pina, MD [facility] 01/30/2019, 9:46 AM

## 2019-02-04 LAB — NOVEL CORONAVIRUS, NAA: SARS-CoV-2, NAA: NOT DETECTED

## 2019-02-06 ENCOUNTER — Telehealth: Payer: Self-pay | Admitting: Family Medicine

## 2019-02-06 NOTE — Telephone Encounter (Signed)
They can always upload it picture to my chart for [place] to look at of the leg or they can schedule in my chart video visit tomorrow with the on-call provider or they can go to the urgent care

## 2019-02-06 NOTE — Telephone Encounter (Signed)
Patient states he wants to come in office to see only [name]. States he has not had a fever in over than a week. States his last fever it was 102 over a week ago.  No other symptoms - please advise

## 2019-02-06 NOTE — Telephone Encounter (Signed)
Let them know that they can upload some pictures to my chart so I can look at it and if anything worsens and they will need to be seen by whoever is available, I do not know when my next call day is

## 2019-02-06 NOTE — Telephone Encounter (Signed)
Son states that patient has not had a fever since Wednesday 7/8

## 2019-02-07 ENCOUNTER — Telehealth (HOSPITAL_COMMUNITY): Payer: Self-pay | Admitting: Emergency Medicine

## 2019-02-07 NOTE — Telephone Encounter (Signed)
Your test for COVID-19 was negative.  Please continue good preventive care measures, including:  frequent hand-washing, avoid touching your face, cover coughs/sneezes, stay out of crowds and keep a 6 foot distance from others.  If you develop fever/cough/breathlessness, please stay home for 10 days and until you have had 3 consecutive days with cough/breathlessness improving and without fever (without taking a fever reducer). Go to the nearest hospital ED tent for assessment if fever/cough/breathlessness are severe or illness seems like a threat to life..  Patient contacted and made aware of all results, all questions answered.

## 2019-02-08 ENCOUNTER — Encounter: Payer: Self-pay | Admitting: Family Medicine

## 2019-02-08 ENCOUNTER — Ambulatory Visit (INDEPENDENT_AMBULATORY_CARE_PROVIDER_SITE_OTHER): Payer: Medicare Other | Admitting: Family Medicine

## 2019-02-08 ENCOUNTER — Other Ambulatory Visit: Payer: Self-pay

## 2019-02-08 VITALS — BP 156/73 | HR 64 | Temp 97.8°F | Ht 70.0 in | Wt 188.0 lb

## 2019-02-08 DIAGNOSIS — L03116 Cellulitis of left lower limb: Secondary | ICD-10-CM

## 2019-02-08 DIAGNOSIS — R609 Edema, unspecified: Secondary | ICD-10-CM

## 2019-02-08 MED ORDER — DOXYCYCLINE HYCLATE 100 MG PO TABS: 100.0000 mg | ORAL_TABLET | Freq: Two times a day (BID) | ORAL | 0 refills | Status: DC

## 2019-02-08 NOTE — Telephone Encounter (Signed)
appointment scheduled

## 2019-02-08 NOTE — Progress Notes (Signed)
BP (!) 156/73   Pulse 64   Temp 97.8 F (36.6 C) (Oral)   Ht _0  (1.778 m)   Wt 188 lb (85.3 kg)   BMI 26.98 kg/m    Subjective:   Patient ID: James [name], male    DOB: 1945/11/19, 73 y.o.   MRN: [id number]  HPI: James [name] is a 73 y.o. male presenting on 02/08/2019 for Leg Swelling (Left leg- Patient states it has been ongoing but has gotten worse )   HPI Left leg swelling and redness of warmth Patient is having left leg swelling redness and warmth that is more than what he usually has.  He has been fighting the swelling and redness warmth for quite some time but this is more than what he has been having.  He denies any fevers or chills although a week and a half ago he did have a fever 103.9 and his blood pressure was down in the 90s that day.  He feels like that left leg is very swollen and very red and very warm.  Relevant past medical, surgical, family and social history reviewed and updated as indicated. Interim medical history since our last visit reviewed. Allergies and medications reviewed and updated.  Review of Systems  Constitutional: Negative for chills and fever.  Respiratory: Negative for shortness of breath and wheezing.   Cardiovascular: Positive for leg swelling. Negative for chest pain and palpitations.  Musculoskeletal: Negative for back pain and gait problem.  Skin: Positive for color change. Negative for rash.  All other systems reviewed and are negative.   Per HPI unless specifically indicated above   Allergies as of 02/08/2019      Reactions   Plavix [clopidogrel Bisulfate] Other (See Comments)   GI Bleed   Uloric [febuxostat]       Medication List       Accurate as of February 08, 2019  1:54 PM. If you have any questions, ask your nurse or doctor.        allopurinol 100 MG tablet Commonly known as: ZYLOPRIM TAKE 2 TABLETS DAILY   amLODipine-atorvastatin 10-40 MG tablet Commonly known as: CADUET TAKE 1 TABLET DAILY    aspirin 81 MG tablet Take 81 mg by mouth 2 (two) times daily with a meal.   cetirizine 5 MG tablet Commonly known as: ZYRTEC Take 1 tablet (5 mg total) by mouth daily.   cloNIDine 0.1 MG tablet Commonly known as: CATAPRES Take 0.1 mg by mouth daily.   fluticasone 50 MCG/ACT nasal spray Commonly known as: FLONASE Place 2 sprays into both nostrils daily.   furosemide 40 MG tablet Commonly known as: LASIX Take 40 mg by mouth daily.   hydrALAZINE 100 MG tablet Commonly known as: APRESOLINE Take 1 tablet by mouth 3 (three) times daily.   metoprolol tartrate 50 MG tablet Commonly known as: LOPRESSOR TAKE ONE AND ONE-HALF TABLETS TWICE A DAY   olopatadine 0.1 % ophthalmic solution Commonly known as: Patanol Place 1 drop into both eyes 2 (two) times daily.   potassium chloride 10 MEQ tablet Commonly known as: K-DUR Take 10 mEq by mouth daily.   sodium bicarbonate 650 MG tablet Take 650 mg by mouth 2 (two) times daily.   Vitamin D3 50 MCG (2000 UT) Tabs Take by mouth.        Objective:   BP (!) 156/73   Pulse 64   Temp 97.8 F (36.6 C) (Oral)   Ht _1  (1.778 m)  Wt 188 lb (85.3 kg)   BMI 26.98 kg/m   Wt Readings from Last 3 Encounters:  02/08/19 188 lb (85.3 kg)  10/14/18 189 lb 12.8 oz (86.1 kg)  09/19/18 196 lb (88.9 kg)    Physical Exam Vitals signs and nursing note reviewed.  Constitutional:      General: He is not in acute distress.    Appearance: He is well-developed. He is not diaphoretic.  Eyes:     General: No scleral icterus.    Conjunctiva/sclera: Conjunctivae normal.  Neck:     Musculoskeletal: Neck supple.     Thyroid: No thyromegaly.  Cardiovascular:     Rate and Rhythm: Normal rate and regular rhythm.     Heart sounds: Normal heart sounds. No murmur.  Pulmonary:     Effort: Pulmonary effort is normal. No respiratory distress.     Breath sounds: Normal breath sounds. No wheezing.  Musculoskeletal:        General: Swelling (3+  edema on left lower extremity and 2+ on right) present.  Lymphadenopathy:     Cervical: No cervical adenopathy.  Skin:    General: Skin is warm and dry.     Findings: Erythema (Erythema and warmth from the knee down to the ankle on the left lower extremity) present. No rash.  Neurological:     Mental Status: He is alert and oriented to person, place, and time.     Coordination: Coordination normal.  Psychiatric:        Behavior: Behavior normal.       Assessment & Plan:   Problem List Items Addressed This Visit    None    Visit Diagnoses    Peripheral edema    -  Primary   Relevant Orders   CBC with Differential/Platelet   CMP14+EGFR   Left leg cellulitis       Relevant Medications   doxycycline (VIBRA-TABS) 100 MG tablet   Other Relevant Orders   CBC with Differential/Platelet   CMP14+EGFR      Will treat for infection currently, needs to be rescheduled for memory testing for annual wellness ASAP. Follow up plan: Return if symptoms worsen or fail to improve, for Needs annual wellness visit ASAP.  Counseling provided for all of the vaccine components No orders of the defined types were placed in this encounter.   Caryl Pina, MD [facility] 02/08/2019, 1:54 PM

## 2019-02-09 LAB — CBC WITH DIFFERENTIAL/PLATELET
Basophils Absolute: 0.1 10*3/uL (ref 0.0–0.2)
Basos: 1 %
EOS (ABSOLUTE): 0.1 10*3/uL (ref 0.0–0.4)
Eos: 1 %
Hematocrit: 32.5 % — ABNORMAL LOW (ref 37.5–51.0)
Hemoglobin: 11.1 g/dL — ABNORMAL LOW (ref 13.0–17.7)
Immature Grans (Abs): 0.1 10*3/uL (ref 0.0–0.1)
Immature Granulocytes: 1 %
Lymphocytes Absolute: 1.5 10*3/uL (ref 0.7–3.1)
Lymphs: 12 %
MCH: 28 pg (ref 26.6–33.0)
MCHC: 34.2 g/dL (ref 31.5–35.7)
MCV: 82 fL (ref 79–97)
Monocytes Absolute: 0.9 10*3/uL (ref 0.1–0.9)
Monocytes: 7 %
Neutrophils Absolute: 9.9 10*3/uL — ABNORMAL HIGH (ref 1.4–7.0)
Neutrophils: 78 %
Platelets: 377 10*3/uL (ref 150–450)
RBC: 3.96 x10E6/uL — ABNORMAL LOW (ref 4.14–5.80)
RDW: 13.9 % (ref 11.6–15.4)
WBC: 12.5 10*3/uL — ABNORMAL HIGH (ref 3.4–10.8)

## 2019-02-09 LAB — CMP14+EGFR
ALT: 23 IU/L (ref 0–44)
AST: 30 IU/L (ref 0–40)
Albumin/Globulin Ratio: 1.1 — ABNORMAL LOW (ref 1.2–2.2)
Albumin: 3.4 g/dL — ABNORMAL LOW (ref 3.7–4.7)
Alkaline Phosphatase: 174 IU/L — ABNORMAL HIGH (ref 39–117)
BUN/Creatinine Ratio: 10 (ref 10–24)
BUN: 21 mg/dL (ref 8–27)
Bilirubin Total: 0.4 mg/dL (ref 0.0–1.2)
CO2: 23 mmol/L (ref 20–29)
Calcium: 8.7 mg/dL (ref 8.6–10.2)
Chloride: 97 mmol/L (ref 96–106)
Creatinine, Ser: 2.08 mg/dL — ABNORMAL HIGH (ref 0.76–1.27)
GFR calc Af Amer: 35 mL/min/{1.73_m2} — ABNORMAL LOW (ref >59)
GFR calc non Af Amer: 31 mL/min/{1.73_m2} — ABNORMAL LOW (ref >59)
Globulin, Total: 3.2 g/dL (ref 1.5–4.5)
Glucose: 93 mg/dL (ref 65–99)
Potassium: 4.5 mmol/L (ref 3.5–5.2)
Sodium: 137 mmol/L (ref 134–144)
Total Protein: 6.6 g/dL (ref 6.0–8.5)

## 2019-02-20 ENCOUNTER — Encounter: Payer: Self-pay | Admitting: Family Medicine

## 2019-02-20 DIAGNOSIS — R609 Edema, unspecified: Secondary | ICD-10-CM

## 2019-02-20 DIAGNOSIS — I890 Lymphedema, not elsewhere classified: Secondary | ICD-10-CM

## 2019-03-02 ENCOUNTER — Other Ambulatory Visit: Payer: Self-pay | Admitting: Family Medicine

## 2019-03-02 DIAGNOSIS — I890 Lymphedema, not elsewhere classified: Secondary | ICD-10-CM

## 2019-03-02 NOTE — Progress Notes (Unsigned)
I have placed a new order for a new referral for lymphedema clinic

## 2019-03-19 ENCOUNTER — Other Ambulatory Visit: Payer: Self-pay | Admitting: Family Medicine

## 2019-04-07 DIAGNOSIS — M109 Gout, unspecified: Secondary | ICD-10-CM | POA: Diagnosis not present

## 2019-04-07 DIAGNOSIS — N184 Chronic kidney disease, stage 4 (severe): Secondary | ICD-10-CM | POA: Diagnosis not present

## 2019-04-11 ENCOUNTER — Encounter (HOSPITAL_COMMUNITY): Payer: Self-pay | Admitting: Physical Therapy

## 2019-04-11 ENCOUNTER — Other Ambulatory Visit: Payer: Self-pay

## 2019-04-11 ENCOUNTER — Ambulatory Visit (HOSPITAL_COMMUNITY): Payer: Medicare Other | Attending: Family Medicine | Admitting: Physical Therapy

## 2019-04-11 DIAGNOSIS — I890 Lymphedema, not elsewhere classified: Secondary | ICD-10-CM | POA: Insufficient documentation

## 2019-04-11 DIAGNOSIS — I129 Hypertensive chronic kidney disease with stage 1 through stage 4 chronic kidney disease, or unspecified chronic kidney disease: Secondary | ICD-10-CM | POA: Diagnosis not present

## 2019-04-11 DIAGNOSIS — E876 Hypokalemia: Secondary | ICD-10-CM | POA: Diagnosis not present

## 2019-04-11 DIAGNOSIS — Z23 Encounter for immunization: Secondary | ICD-10-CM | POA: Diagnosis not present

## 2019-04-11 DIAGNOSIS — N184 Chronic kidney disease, stage 4 (severe): Secondary | ICD-10-CM | POA: Diagnosis not present

## 2019-04-11 DIAGNOSIS — I701 Atherosclerosis of renal artery: Secondary | ICD-10-CM | POA: Diagnosis not present

## 2019-04-11 DIAGNOSIS — N2581 Secondary hyperparathyroidism of renal origin: Secondary | ICD-10-CM | POA: Diagnosis not present

## 2019-04-11 DIAGNOSIS — M109 Gout, unspecified: Secondary | ICD-10-CM | POA: Diagnosis not present

## 2019-04-11 DIAGNOSIS — R600 Localized edema: Secondary | ICD-10-CM | POA: Diagnosis not present

## 2019-04-11 NOTE — Therapy (Signed)
[facility] [address] [place], [place], [address] Phone: [phone]   Fax:  [phone]  Physical Therapy Evaluation  Patient Details  Name: James [name] MRN: [id number] Date of Birth: 1946-04-02 Referring Provider (PT): Vonna Kotyk Dittinger   Encounter Date: 04/11/2019  PT End of Session - 04/11/19 1320    Visit Number  1    Number of Visits  12    Date for PT Re-Evaluation  05/11/19    PT Start Time  4536    PT Stop Time  1135    PT Time Calculation (min)  50 min    Activity Tolerance  Patient tolerated treatment well    Behavior During Therapy  [facility] for tasks assessed/performed       Past Medical History:  Diagnosis Date  . Arthritis   . Carotid artery occlusion   . DVT (deep venous thrombosis) ([place]) 2008  . DVT (deep venous thrombosis) ([place])   . GERD (gastroesophageal reflux disease)    takes Protonix daily  . Gout    takes Allopurinol daily and Indomethacin prn  . Gout flare   . H/O renal artery stenosis   . H/O: GI bleed   . History of blood transfusion   . History of colon polyps   . History of gastric ulcer   . Hyperlipidemia    takes Lipitor daily  . Hypertension    takes Metoprolol and Amlodipine daily  . Joint pain   . Joint swelling     Past Surgical History:  Procedure Laterality Date  . CAROTID ENDARTERECTOMY  2008   right CEA  . CHOLECYSTECTOMY  mid 90's  . COLON SURGERY    . COLONOSCOPY    . ENDARTERECTOMY  06/02/2012   Procedure: ENDARTERECTOMY CAROTID;  Surgeon: Serafina Mitchell, MD;  Location: [facility] OR;  Service: Vascular;  Laterality: Left;  . ESOPHAGOGASTRODUODENOSCOPY    . HERNIA REPAIR     umbilical hernia  . PATCH ANGIOPLASTY  06/02/2012   Procedure: PATCH ANGIOPLASTY;  Surgeon: Serafina Mitchell, MD;  Location: [place];  Service: Vascular;  Laterality: Left;  using Vascu-Guard Patch  . PERIPHERAL VASCULAR CATHETERIZATION Right 12/03/2015   Procedure: Peripheral Vascular Balloon Angioplasty;   Surgeon: Serafina Mitchell, MD;  Location: [place] CV LAB;  Service: Cardiovascular;  Laterality: Right;  RENAL unsuccessful  . RENAL ARTERY STENT  2009   Right renal artery stenting-  Right Kidney  . small intestine removed  2001   d/t random gi bleeding    There were no vitals filed for this visit.   Subjective Assessment - 04/11/19 1312    Subjective  The patient has been having swelling in his Lt LE for over two years.  The more he is up the more it swells.  He has been seen in this clinic before in June of 2018.  He has been wearing his compresison garments, (bought at [employer]),  but has cut them due to the fact that they are  to tight at the end of the day and causing bottlenecking in his swelling that becomes quite painful.  He recieved a compression pump but is using it about once every two weeks.    Pertinent History  DVT, OA    Limitations  Standing;Walking    How long can you stand comfortably?  The longer he stands the more swelling he has    How long can you walk comfortably?  The longer he walks the more swelling  he has.    Patient Stated Goals  less swelling    Currently in Pain?  No/denies   only when the compression socks dig into him.        [facility] PT Assessment - 04/11/19 0001      Assessment   Medical Diagnosis  LT LE lymphedema    Referring Provider (PT)  Vonna Kotyk Dittinger    Onset Date/Surgical Date  11/25/18   acute exacerbation.   Next MD Visit  unknown     Prior Therapy  lymphedema rx 12/2016      Precautions   Precautions  --   cellulitis   Precaution Comments  3rd toe red with small open wound asked pt to call MD to get antibiotic.       Balance Screen   Has the patient fallen in the past 6 months  No    Has the patient had a decrease in activity level because of a fear of falling?   Yes    Is the patient reluctant to leave their home because of a fear of falling?   No      Home Film/video editor residence      Prior  Function   Level of Independence  Independent      Cognition   Overall Cognitive Status  Within Functional Limits for tasks assessed      Observation/Other Assessments   Focus on Therapeutic Outcomes (FOTO)   Life impact 45       Observation/Other Assessments-Edema    Edema  --   3rd toe red into dorsal of foot with small open wound       LYMPHEDEMA/ONCOLOGY QUESTIONNAIRE - 04/11/19 1119      What other symptoms do you have   Are you Having Heaviness or Tightness  Yes    Are you having Pain  No    Are you having pitting edema  Yes    Is it Hard or Difficult finding clothes that fit  No    Do you have infections  Yes    Stemmer Sign  Yes      Lymphedema Assessments   Lymphedema Assessments  Lower extremities      Right Lower Extremity Lymphedema   30 cm Proximal to Floor at Lateral Plantar Foot  37.3 cm    20 cm Proximal to Floor at Lateral Plantar Foot  30.5 1    10  cm Proximal to Floor at Lateral Malleoli  26.6 cm    Circumference of ankle/heel  34.3 cm.    5 cm Proximal to 1st MTP Joint  24 cm    Across MTP Joint  25.8 cm      Left Lower Extremity Lymphedema   30 cm Proximal to Floor at Lateral Plantar Foot  40 cm    20 cm Proximal to Floor at Lateral Plantar Foot  30.5 cm    10 cm Proximal to Floor at Lateral Malleoli  31.3 cm    Circumference of ankle/heel  37 cm.    5 cm Proximal to 1st MTP Joint  26.5 cm    Across MTP Joint  26.7 cm    Around Proximal Great Toe  11.5 cm             Objective measurements completed on examination: See above findings.              PT Education - 04/11/19 1316    Education Details  Educated pt  on lymphedema and what it is and it's chronic nature.  Explained that he needs to be using his pump everyday.  Explained that compression garments are only good for 6 months and that the compression garments bought at [facility] are most likely not gradiented pressure.    Person(s) Educated  Patient;Child(ren)    Methods   Explanation    Comprehension  Verbalized understanding       PT Short Term Goals - 04/11/19 1344      PT SHORT TERM GOAL #1   Title  PT to verbalize that he is using his pump everyday that he does not have multilayer compression bandaging on.    Time  2    Period  Weeks    Status  New    Target Date  04/25/19      PT SHORT TERM GOAL #2   Title  PT To have lost 1-2 cm of fluid in his LT ankle and forefoot.    Time  2    Period  Weeks    Status  New        PT Long Term Goals - 04/11/19 1342      PT LONG TERM GOAL #1   Title  PT to understand that he needs to get compression garments that are gradient and exchange every 6 months    Time  4    Period  Weeks    Status  New    Target Date  05/09/19      PT LONG TERM GOAL #2   Title  PT to have decreased 2-3 cm in ankle and forefoot area on Lt LE to be able to fit into shoes with ease.    Time  4    Period  Weeks    Status  New             Plan - 04/11/19 1321    Clinical Impression Statement  James [name] is a 73 yo male who has been diagnosed with lymphedema.  He was seen in June of 2018 for five treatments, reduced well and was discharged with compression garments, and a compression pump.  At this time he is not using his pump on a regular basis, and is wearing old compression garments.  He is in need of total  Decongestive techniques to decrease his volumes  And will benefit from  Increased  knowledge on how to maintain volumes when you have lymphedema.    Personal Factors and Comorbidities  Behavior Pattern    Examination-Activity Limitations  Dressing   difficult getting shoes and socks on   Stability/Clinical Decision Making  Evolving/Moderate complexity    Clinical Decision Making  Low    Rehab Potential  Good    PT Frequency  3x / week    PT Duration  4 weeks    PT Treatment/Interventions  ADLs/Self Care Home Management;Manual lymph drainage;Compression bandaging    PT Next Visit Plan  cut foam for Lt LE,  begin manual lymph drainage followed by toe to knee compression bandaging on Lt.  Assess how toe looks and see if pt obtained antibioticsl.    Consulted and Agree with Plan of Care  Patient;Family member/caregiver    Family Member Consulted  son       Patient will benefit from skilled therapeutic intervention in order to improve the following deficits and impairments:  Increased edema  Visit Diagnosis: Lymphedema, not elsewhere classified     Problem List Patient Active Problem List  Diagnosis Date Noted  . Chronic kidney disease (CKD), stage III (moderate) ([place]) 10/21/2017  . OSA on CPAP 03/09/2016  . Hypersomnia 01/07/2016  . Exertional dyspnea 01/07/2016  . Back pain 08/01/2015  . Gastroesophageal reflux disease with esophagitis 07/17/2015  . Accelerated secondary hypertension 07/17/2015  . S/P arterial stent 04/29/2015  . Renal artery stenosis ([place]) 12/26/2012  . Hyperlipidemia LDL goal <100 10/11/2012  . Gout 10/11/2012  . Occlusion and stenosis of carotid artery without mention of cerebral infarction 10/12/2011   Rayetta Humphrey, PT CLT [phone] 04/11/2019, 1:45 PM  [place], [place], [address] Phone: [phone]   Fax:  [phone]  Name: James [name] MRN: [id number] Date of Birth: Sep 03, 1945

## 2019-04-13 ENCOUNTER — Ambulatory Visit (HOSPITAL_COMMUNITY): Payer: Medicare Other | Admitting: Physical Therapy

## 2019-04-13 ENCOUNTER — Other Ambulatory Visit: Payer: Self-pay

## 2019-04-13 DIAGNOSIS — I890 Lymphedema, not elsewhere classified: Secondary | ICD-10-CM

## 2019-04-13 NOTE — Therapy (Signed)
[place], [place], [address] Phone: [phone]   Fax:  [phone]  Physical Therapy Treatment  Patient Details  Name: James [name] MRN: [id number] Date of Birth: 11/12/1945 Referring Provider (PT): Vonna Kotyk Dittinger   Encounter Date: 04/13/2019  PT End of Session - 04/13/19 1526    Visit Number  2    Number of Visits  12    Date for PT Re-Evaluation  05/11/19    PT Start Time  1005    PT Stop Time  1050    PT Time Calculation (min)  45 min    Activity Tolerance  Patient tolerated treatment well    Behavior During Therapy  [facility] for tasks assessed/performed       Past Medical History:  Diagnosis Date  . Arthritis   . Carotid artery occlusion   . DVT (deep venous thrombosis) ([place]) 2008  . DVT (deep venous thrombosis) ([place])   . GERD (gastroesophageal reflux disease)    takes Protonix daily  . Gout    takes Allopurinol daily and Indomethacin prn  . Gout flare   . H/O renal artery stenosis   . H/O: GI bleed   . History of blood transfusion   . History of colon polyps   . History of gastric ulcer   . Hyperlipidemia    takes Lipitor daily  . Hypertension    takes Metoprolol and Amlodipine daily  . Joint pain   . Joint swelling     Past Surgical History:  Procedure Laterality Date  . CAROTID ENDARTERECTOMY  2008   right CEA  . CHOLECYSTECTOMY  mid 90's  . COLON SURGERY    . COLONOSCOPY    . ENDARTERECTOMY  06/02/2012   Procedure: ENDARTERECTOMY CAROTID;  Surgeon: Serafina Mitchell, MD;  Location: [facility] OR;  Service: Vascular;  Laterality: Left;  . ESOPHAGOGASTRODUODENOSCOPY    . HERNIA REPAIR     umbilical hernia  . PATCH ANGIOPLASTY  06/02/2012   Procedure: PATCH ANGIOPLASTY;  Surgeon: Serafina Mitchell, MD;  Location: [facility];  Service: Vascular;  Laterality: Left;  using Vascu-Guard Patch  . PERIPHERAL VASCULAR CATHETERIZATION Right 12/03/2015   Procedure: Peripheral Vascular Balloon Angioplasty;   Surgeon: Serafina Mitchell, MD;  Location: [place] CV LAB;  Service: Cardiovascular;  Laterality: Right;  RENAL unsuccessful  . RENAL ARTERY STENT  2009   Right renal artery stenting-  Right Kidney  . small intestine removed  2001   d/t random gi bleeding    There were no vitals filed for this visit.  Subjective Assessment - 04/13/19 1522    Subjective  pt states he found his 2 new pair of knee highs, 20-11mmHg and threw all his other ones away.  Comes today wearing these which fit good.    Currently in Pain?  No/denies                       [facility] Adult PT Treatment/Exercise - 04/13/19 0001      Manual Therapy   Manual Therapy  Compression Bandaging    Manual therapy comments  completed to Lt LE    Compression Bandaging  1/2" foam, multilayered short stretch compression bandaging and toe bandaging             PT Education - 04/13/19 1524    Education Details  Reviewed goals and POC moving forward.  Educated on when to wear garments vs bandaging and to  only use pump when not wearing these.  Suggested pt purchase OTC fungal cream for Lt second toe to apply as appears to be this.  Instructed to removed bandaging if toes goe numb, uncomfortable, increased pain.   Instructed to re-roll bandaging prior to Monday and return with bandages.    Person(s) Educated  Patient    Methods  Explanation;Demonstration    Comprehension  Verbalized understanding;Returned demonstration       PT Short Term Goals - 04/11/19 1344      PT SHORT TERM GOAL #1   Title  PT to verbalize that he is using his pump everyday that he does not have multilayer compression bandaging on.    Time  2    Period  Weeks    Status  New    Target Date  04/25/19      PT SHORT TERM GOAL #2   Title  PT To have lost 1-2 cm of fluid in his LT ankle and forefoot.    Time  2    Period  Weeks    Status  New        PT Long Term Goals - 04/11/19 1342      PT LONG TERM GOAL #1   Title  PT to  understand that he needs to get compression garments that are gradient and exchange every 6 months    Time  4    Period  Weeks    Status  New    Target Date  05/09/19      PT LONG TERM GOAL #2   Title  PT to have decreased 2-3 cm in ankle and forefoot area on Lt LE to be able to fit into shoes with ease.    Time  4    Period  Weeks    Status  New            Plan - 04/13/19 1527    Clinical Impression Statement  Pt returns today wearing his new pack of 20-25mmHg knee highs.  Pt requested information to order additional pairs and given El[employer] therapy flier.  Spent time on education and continued throughout fitting with foam and LE bandaging.  Moisturized Lt LE well prior to bandaging.  Pt reported overall comfort.  No time today to complete manual lymph drainage.    Personal Factors and Comorbidities  Behavior Pattern    Examination-Activity Limitations  Dressing   difficult getting shoes and socks on   Stability/Clinical Decision Making  Evolving/Moderate complexity    Rehab Potential  Good    PT Frequency  3x / week    PT Duration  4 weeks    PT Treatment/Interventions  ADLs/Self Care Home Management;Manual lymph drainage;Compression bandaging    PT Next Visit Plan  Next session begin manual lymph drainage followed by toe to knee compression bandaging on Lt.  Assess how toe looks with antifungal and if no improvement urge to get antibiotics.    Consulted and Agree with Plan of Care  Patient;Family member/caregiver    Family Member Consulted  son       Patient will benefit from skilled therapeutic intervention in order to improve the following deficits and impairments:  Increased edema  Visit Diagnosis: Lymphedema, not elsewhere classified     Problem List Patient Active Problem List   Diagnosis Date Noted  . Chronic kidney disease (CKD), stage III (moderate) ([place]) 10/21/2017  . OSA on CPAP 03/09/2016  . Hypersomnia 01/07/2016  . Exertional dyspnea 01/07/2016  .  Back  pain 08/01/2015  . Gastroesophageal reflux disease with esophagitis 07/17/2015  . Accelerated secondary hypertension 07/17/2015  . S/P arterial stent 04/29/2015  . Renal artery stenosis ([place]) 12/26/2012  . Hyperlipidemia LDL goal <100 10/11/2012  . Gout 10/11/2012  . Occlusion and stenosis of carotid artery without mention of cerebral infarction 10/12/2011   Teena Irani, PTA/CLT [phone]  Teena Irani 04/13/2019, 3:32 PM  [facility] [address] [place], [place], [address] Phone: [phone]   Fax:  [phone]  Name: James [name] MRN: [id number] Date of Birth: 11/08/1945

## 2019-04-17 ENCOUNTER — Other Ambulatory Visit: Payer: Self-pay

## 2019-04-17 ENCOUNTER — Ambulatory Visit (HOSPITAL_COMMUNITY): Payer: Medicare Other | Admitting: Physical Therapy

## 2019-04-17 DIAGNOSIS — I890 Lymphedema, not elsewhere classified: Secondary | ICD-10-CM | POA: Diagnosis not present

## 2019-04-17 NOTE — Therapy (Signed)
[place], [place], [address] Phone: [phone]   Fax:  [phone]  Physical Therapy Treatment  Patient Details  Name: James [name] MRN: [id number] Date of Birth: 12/17/1945 Referring Provider (PT): Vonna Kotyk Dittinger   Encounter Date: 04/17/2019  PT End of Session - 04/17/19 1720    Visit Number  3    Number of Visits  12    Date for PT Re-Evaluation  05/11/19    PT Start Time  1130    PT Stop Time  1210    PT Time Calculation (min)  40 min    Activity Tolerance  Patient tolerated treatment well    Behavior During Therapy  [facility] for tasks assessed/performed       Past Medical History:  Diagnosis Date  . Arthritis   . Carotid artery occlusion   . DVT (deep venous thrombosis) ([place]) 2008  . DVT (deep venous thrombosis) ([place])   . GERD (gastroesophageal reflux disease)    takes Protonix daily  . Gout    takes Allopurinol daily and Indomethacin prn  . Gout flare   . H/O renal artery stenosis   . H/O: GI bleed   . History of blood transfusion   . History of colon polyps   . History of gastric ulcer   . Hyperlipidemia    takes Lipitor daily  . Hypertension    takes Metoprolol and Amlodipine daily  . Joint pain   . Joint swelling     Past Surgical History:  Procedure Laterality Date  . CAROTID ENDARTERECTOMY  2008   right CEA  . CHOLECYSTECTOMY  mid 90's  . COLON SURGERY    . COLONOSCOPY    . ENDARTERECTOMY  06/02/2012   Procedure: ENDARTERECTOMY CAROTID;  Surgeon: Serafina Mitchell, MD;  Location: [facility] OR;  Service: Vascular;  Laterality: Left;  . ESOPHAGOGASTRODUODENOSCOPY    . HERNIA REPAIR     umbilical hernia  . PATCH ANGIOPLASTY  06/02/2012   Procedure: PATCH ANGIOPLASTY;  Surgeon: Serafina Mitchell, MD;  Location: [facility];  Service: Vascular;  Laterality: Left;  using Vascu-Guard Patch  . PERIPHERAL VASCULAR CATHETERIZATION Right 12/03/2015   Procedure: Peripheral Vascular Balloon Angioplasty;   Surgeon: Serafina Mitchell, MD;  Location: [place] CV LAB;  Service: Cardiovascular;  Laterality: Right;  RENAL unsuccessful  . RENAL ARTERY STENT  2009   Right renal artery stenting-  Right Kidney  . small intestine removed  2001   d/t random gi bleeding    There were no vitals filed for this visit.  Subjective Assessment - 04/17/19 1719    Subjective  pt states he can tell a big improvement already.    Currently in Pain?  No/denies                       [facility] Adult PT Treatment/Exercise - 04/17/19 0001      Manual Therapy   Manual Therapy  Compression Bandaging;Manual Lymphatic Drainage (MLD)    Manual therapy comments  completed to Lt LE    Manual Lymphatic Drainage (MLD)  Lt LE with inguinal axillary anastomosis    Compression Bandaging  1/2" foam, multilayered short stretch compression bandaging and toe bandaging               PT Short Term Goals - 04/11/19 1344      PT SHORT TERM GOAL #1   Title  PT to verbalize that he is  using his pump everyday that he does not have multilayer compression bandaging on.    Time  2    Period  Weeks    Status  New    Target Date  04/25/19      PT SHORT TERM GOAL #2   Title  PT To have lost 1-2 cm of fluid in his LT ankle and forefoot.    Time  2    Period  Weeks    Status  New        PT Long Term Goals - 04/11/19 1342      PT LONG TERM GOAL #1   Title  PT to understand that he needs to get compression garments that are gradient and exchange every 6 months    Time  4    Period  Weeks    Status  New    Target Date  05/09/19      PT LONG TERM GOAL #2   Title  PT to have decreased 2-3 cm in ankle and forefoot area on Lt LE to be able to fit into shoes with ease.    Time  4    Period  Weeks    Status  New            Plan - 04/17/19 1721    Clinical Impression Statement  Noted improvement in distal LE into ankle and foot.  Second toe still unchanged with redness present.   Suggested again he  get an antifungal for this, wrote this down and gave it to his wife.  MLD continued and bandaging.  Will measure next visit    Personal Factors and Comorbidities  Behavior Pattern    Examination-Activity Limitations  Dressing   difficult getting shoes and socks on   Stability/Clinical Decision Making  Evolving/Moderate complexity    Rehab Potential  Good    PT Frequency  3x / week    PT Duration  4 weeks    PT Treatment/Interventions  ADLs/Self Care Home Management;Manual lymph drainage;Compression bandaging    PT Next Visit Plan  continue with complete lymphedema therapy for Lt LE. Measure next session . Apply antifungal when pt returns.    Consulted and Agree with Plan of Care  Patient;Family member/caregiver    Family Member Consulted  son       Patient will benefit from skilled therapeutic intervention in order to improve the following deficits and impairments:  Increased edema  Visit Diagnosis: Lymphedema, not elsewhere classified     Problem List Patient Active Problem List   Diagnosis Date Noted  . Chronic kidney disease (CKD), stage III (moderate) ([place]) 10/21/2017  . OSA on CPAP 03/09/2016  . Hypersomnia 01/07/2016  . Exertional dyspnea 01/07/2016  . Back pain 08/01/2015  . Gastroesophageal reflux disease with esophagitis 07/17/2015  . Accelerated secondary hypertension 07/17/2015  . S/P arterial stent 04/29/2015  . Renal artery stenosis ([place]) 12/26/2012  . Hyperlipidemia LDL goal <100 10/11/2012  . Gout 10/11/2012  . Occlusion and stenosis of carotid artery without mention of cerebral infarction 10/12/2011    Teena Irani 04/17/2019, 5:24 PM  [place], [place], [address] Phone: [phone]   Fax:  [phone]  Name: James [name] MRN: [id number] Date of Birth: 06-27-1946

## 2019-04-19 ENCOUNTER — Ambulatory Visit (HOSPITAL_COMMUNITY): Payer: Medicare Other | Admitting: Physical Therapy

## 2019-04-19 ENCOUNTER — Other Ambulatory Visit: Payer: Self-pay

## 2019-04-19 DIAGNOSIS — I890 Lymphedema, not elsewhere classified: Secondary | ICD-10-CM | POA: Diagnosis not present

## 2019-04-19 NOTE — Therapy (Addendum)
[place], [place], [address] Phone: [phone]   Fax:  [phone]  Physical Therapy Treatment  Patient Details  Name: James [name] MRN: [id number] Date of Birth: [birth year] Referring Provider (PT): Vonna Kotyk Dittinger  PHYSICAL THERAPY DISCHARGE SUMMARY  Visits from Start of Care: 4  Current functional level related to goals / functional outcomes: All goals met pt has reduced in volume   Remaining deficits: none   Education / Equipment: Where to buy compression garments and to get new garments every six months.  Pt received a compression pump instructed to use daily.  Plan: Patient agrees to discharge.  Patient goals were not met. Patient is being discharged due to meeting the stated rehab goals.  ?????           Encounter Date: 04/19/2019  PT End of Session - 04/19/19 1346    Visit Number  4    Number of Visits  4   Date for PT Re-Evaluation  05/11/19    PT Start Time  1696    PT Stop Time  1210    PT Time Calculation (min)  35 min    Activity Tolerance  Patient tolerated treatment well    Behavior During Therapy  [facility] for tasks assessed/performed       Past Medical History:  Diagnosis Date  . Arthritis   . Carotid artery occlusion   . DVT (deep venous thrombosis) ([place]) 2008  . DVT (deep venous thrombosis) ([place])   . GERD (gastroesophageal reflux disease)    takes Protonix daily  . Gout    takes Allopurinol daily and Indomethacin prn  . Gout flare   . H/O renal artery stenosis   . H/O: GI bleed   . History of blood transfusion   . History of colon polyps   . History of gastric ulcer   . Hyperlipidemia    takes Lipitor daily  . Hypertension    takes Metoprolol and Amlodipine daily  . Joint pain   . Joint swelling     Past Surgical History:  Procedure Laterality Date  . CAROTID ENDARTERECTOMY  2008   right CEA  . CHOLECYSTECTOMY  mid 90's  . COLON SURGERY    .  COLONOSCOPY    . ENDARTERECTOMY  06/02/2012   Procedure: ENDARTERECTOMY CAROTID;  Surgeon: Serafina Mitchell, MD;  Location: [facility] OR;  Service: Vascular;  Laterality: Left;  . ESOPHAGOGASTRODUODENOSCOPY    . HERNIA REPAIR     umbilical hernia  . PATCH ANGIOPLASTY  06/02/2012   Procedure: PATCH ANGIOPLASTY;  Surgeon: Serafina Mitchell, MD;  Location: [facility];  Service: Vascular;  Laterality: Left;  using Vascu-Guard Patch  . PERIPHERAL VASCULAR CATHETERIZATION Right 12/03/2015   Procedure: Peripheral Vascular Balloon Angioplasty;  Surgeon: Serafina Mitchell, MD;  Location: [facility] CV LAB;  Service: Cardiovascular;  Laterality: Right;  RENAL unsuccessful  . RENAL ARTERY STENT  2009   Right renal artery stenting-  Right Kidney  . small intestine removed  2001   d/t random gi bleeding    There were no vitals filed for this visit.  Subjective Assessment - 04/19/19 1342    Subjective  pt comes today accompanied by wife.  States he just removed his bandages this morning and his leg is so much better now.            LYMPHEDEMA/ONCOLOGY QUESTIONNAIRE - 04/19/19 1423      What other symptoms do you have  Are you Having Heaviness or Tightness  --    Are you having Pain  --    Are you having pitting edema  --    Is it Hard or Difficult finding clothes that fit  --    Do you have infections  --    Stemmer Sign  --      Lymphedema Assessments   Lymphedema Assessments  --      Right Lower Extremity Lymphedema   30 cm Proximal to Floor at Lateral Plantar Foot  34.8 cm   was 37.3 cm on 9/15   20 cm Proximal to Floor at Lateral Plantar Foot  29.5 1   was 30.5   10 cm Proximal to Floor at Lateral Malleoli  23 cm   was 26.6   Circumference of ankle/heel  32 cm.   was 34.3   5 cm Proximal to 1st MTP Joint  23 cm   was 24   Across MTP Joint  25 cm   was 25.8   Around Proximal Great Toe  --   was not tested     Left Lower Extremity Lymphedema   30 cm Proximal to Floor at Lateral Plantar  Foot  34.5 cm   was 40 cm on 9/15   20 cm Proximal to Floor at Lateral Plantar Foot  29.8 cm   was 30.5   10 cm Proximal to Floor at Lateral Malleoli  23.3 cm   was 31.3   Circumference of ankle/heel  32 cm.   was 37   5 cm Proximal to 1st MTP Joint  23.5 cm   was 26.5   Across MTP Joint  25.3 cm   was 26.7   Around Proximal Great Toe  9.7 cm   was 11.5               [facility] Adult PT Treatment/Exercise - 04/19/19 0001      Manual Therapy   Manual Therapy  Other (comment)    Other Manual Therapy  measurement             PT Education - 04/19/19 1344    Education Details  educated on future care of Lt LE to maintain and prevent increased edema.  given laminated information shee (see scanned in media) and informed to puchase new garement every 6 months.    Person(s) Educated  Patient    Methods  Explanation    Comprehension  Verbalized understanding       PT Short Term Goals - 04/19/19 1428      PT SHORT TERM GOAL #1   Title  PT to verbalize that he is using his pump everyday that he does not have multilayer compression bandaging on.    Time  2    Period  Weeks    Status  Achieved    Target Date  04/25/19      PT SHORT TERM GOAL #2   Title  PT To have lost 1-2 cm of fluid in his LT ankle and forefoot.    Time  2    Period  Weeks    Status  Achieved        PT Long Term Goals - 04/19/19 1428      PT LONG TERM GOAL #1   Title  PT to understand that he needs to get compression garments that are gradient and exchange every 6 months    Time  4    Period  Weeks  Status  Achieved      PT LONG TERM GOAL #2   Title  PT to have decreased 2-3 cm in ankle and forefoot area on Lt LE to be able to fit into shoes with ease.    Time  4    Period  Weeks    Status  Achieved            Plan - 04/19/19 1417    Clinical Impression Statement  bilateral LE's measured this session with noted reduction in both LE's since wearing compression on Rt LE and  bandaging Lt LE.  Lt LE continues to be slightly larger than Rt, however much more improved than intially.  Educated wife and pateint regarding maintenance phase as far as using pump, moistuirzing and wwearing garments.  Pt and wife given laminated copy of detailed instructions.  All questions answered and neither with any further questions.  pt able to don compression garment on Lt LE independently.    Personal Factors and Comorbidities  Behavior Pattern    Examination-Activity Limitations  Dressing   difficult getting shoes and socks on   Stability/Clinical Decision Making  Evolving/Moderate complexity    Rehab Potential  Good    PT Frequency  3x / week    PT Duration  4 weeks    PT Treatment/Interventions  ADLs/Self Care Home Management;Manual lymph drainage;Compression bandaging    PT Next Visit Plan  discharge as all goals met,pt is independent with donning/doffing garments.    Consulted and Agree with Plan of Care  Patient;Family member/caregiver    Family Member Consulted  son       Patient will benefit from skilled therapeutic intervention in order to improve the following deficits and impairments:  Increased edema  Visit Diagnosis: Lymphedema, not elsewhere classified     Problem List Patient Active Problem List   Diagnosis Date Noted  . Chronic kidney disease (CKD), stage III (moderate) ([place]) 10/21/2017  . OSA on CPAP 03/09/2016  . Hypersomnia 01/07/2016  . Exertional dyspnea 01/07/2016  . Back pain 08/01/2015  . Gastroesophageal reflux disease with esophagitis 07/17/2015  . Accelerated secondary hypertension 07/17/2015  . S/P arterial stent 04/29/2015  . Renal artery stenosis ([place]) 12/26/2012  . Hyperlipidemia LDL goal <100 10/11/2012  . Gout 10/11/2012  . Occlusion and stenosis of carotid artery without mention of cerebral infarction 10/12/2011   Teena Irani, PTA/CLT [place], PT CLT [phone] 04/19/2019, 2:29 PM  [place], [place], [address] Phone: [phone]   Fax:  [phone]  Name: HAWKE VILLALPANDO MRN: [id number] Date of Birth: [birth year]

## 2019-04-25 ENCOUNTER — Ambulatory Visit (HOSPITAL_COMMUNITY): Payer: Medicare Other | Admitting: Physical Therapy

## 2019-04-27 ENCOUNTER — Ambulatory Visit (HOSPITAL_COMMUNITY): Payer: Medicare Other | Admitting: Physical Therapy

## 2019-05-01 ENCOUNTER — Encounter (HOSPITAL_COMMUNITY): Payer: Medicare Other | Admitting: Physical Therapy

## 2019-05-03 ENCOUNTER — Encounter (HOSPITAL_COMMUNITY): Payer: Medicare Other | Admitting: Physical Therapy

## 2019-05-05 ENCOUNTER — Encounter (HOSPITAL_COMMUNITY): Payer: Medicare Other | Admitting: Physical Therapy

## 2019-05-08 ENCOUNTER — Encounter (HOSPITAL_COMMUNITY): Payer: Medicare Other | Admitting: Physical Therapy

## 2019-05-10 ENCOUNTER — Encounter (HOSPITAL_COMMUNITY): Payer: Medicare Other | Admitting: Physical Therapy

## 2019-05-12 ENCOUNTER — Other Ambulatory Visit: Payer: Self-pay

## 2019-05-15 ENCOUNTER — Other Ambulatory Visit: Payer: Self-pay

## 2019-05-15 ENCOUNTER — Ambulatory Visit (INDEPENDENT_AMBULATORY_CARE_PROVIDER_SITE_OTHER): Payer: Medicare Other | Admitting: Family Medicine

## 2019-05-15 ENCOUNTER — Encounter: Payer: Self-pay | Admitting: Family Medicine

## 2019-05-15 VITALS — BP 170/69 | HR 71 | Temp 98.9°F | Ht 70.0 in | Wt 178.6 lb

## 2019-05-15 DIAGNOSIS — E785 Hyperlipidemia, unspecified: Secondary | ICD-10-CM

## 2019-05-15 DIAGNOSIS — I10 Essential (primary) hypertension: Secondary | ICD-10-CM

## 2019-05-15 DIAGNOSIS — N1832 Chronic kidney disease, stage 3b: Secondary | ICD-10-CM

## 2019-05-15 DIAGNOSIS — I159 Secondary hypertension, unspecified: Secondary | ICD-10-CM

## 2019-05-15 MED ORDER — AMLODIPINE-ATORVASTATIN 10-40 MG PO TABS: 1.0000 | ORAL_TABLET | Freq: Every day | ORAL | 3 refills | Status: DC

## 2019-05-15 NOTE — Progress Notes (Signed)
BP (!) 170/69   Pulse 71   Temp 98.9 F (37.2 C) (Temporal)   Ht 5' 10"  (1.778 m)   Wt 178 lb 9.6 oz (81 kg)   SpO2 96%   BMI 25.63 kg/m    Subjective:   Patient ID: James [name], male    DOB: January 11, 1946, 73 y.o.   MRN: [id number]  HPI: James [name] is a 73 y.o. male presenting on 05/15/2019 for Hypertension (3 month follow up)   HPI Hypertension Patient is currently on amlodipine and clonidine and hydralazine, and their blood pressure today is 170/69 but he says at home it was running in the 152/70 and 148/70 today before he came.. Patient denies any lightheadedness or dizziness. Patient denies headaches, blurred vision, chest pains, shortness of breath, or weakness. Denies any side effects from medication and is content with current medication.   Hyperlipidemia Patient is coming in for recheck of his hyperlipidemia. The patient is currently taking atorvastatin 40. They deny any issues with myalgias or history of liver damage from it. They deny any focal numbness or weakness or chest pain.   CKD stage III Patient has stage III CKD and it looks like his blood work showed elevated renal function last time he had checked a couple months ago.  Patient denies any urinary troubles.  Patient has history of renal artery stenosis and stenting.  Relevant past medical, surgical, family and social history reviewed and updated as indicated. Interim medical history since our last visit reviewed. Allergies and medications reviewed and updated.  Review of Systems  Constitutional: Negative for chills and fever.  Eyes: Negative for visual disturbance.  Respiratory: Negative for shortness of breath and wheezing.   Cardiovascular: Negative for chest pain and leg swelling.  Musculoskeletal: Negative for back pain and gait problem.  Skin: Negative for rash.  Neurological: Negative for dizziness, weakness, light-headedness and numbness.  All other systems reviewed and are negative.   Per HPI unless specifically indicated above   Allergies as of 05/15/2019      Reactions   Plavix [clopidogrel Bisulfate] Other (See Comments)   GI Bleed   Uloric [febuxostat]       Medication List       Accurate as of May 15, 2019  2:33 PM. If you have any questions, ask your nurse or doctor.        STOP taking these medications   doxycycline 100 MG tablet Commonly known as: VIBRA-TABS Stopped by: [name] , MD   metoprolol tartrate 50 MG tablet Commonly known as: LOPRESSOR Stopped by: [name] , MD   olopatadine 0.1 % ophthalmic solution Commonly known as: Patanol Stopped by: [name] , MD     TAKE these medications   allopurinol 100 MG tablet Commonly known as: ZYLOPRIM TAKE 2 TABLETS DAILY   amLODipine-atorvastatin 10-40 MG tablet Commonly known as: CADUET Take 1 tablet by mouth daily.   aspirin 81 MG tablet Take 81 mg by mouth 2 (two) times daily with a meal.   cetirizine 5 MG tablet Commonly known as: ZYRTEC Take 1 tablet (5 mg total) by mouth daily.   cloNIDine 0.2 MG tablet Commonly known as: CATAPRES Take 0.2 mg by mouth 2 (two) times daily. What changed: Another medication with the same name was removed. Continue taking this medication, and follow the directions you see here. Changed by: [name] , MD   fluticasone 50 MCG/ACT nasal spray Commonly known as: FLONASE Place 2 sprays into both nostrils  daily.   furosemide 40 MG tablet Commonly known as: LASIX Take 40 mg by mouth daily.   hydrALAZINE 100 MG tablet Commonly known as: APRESOLINE Take 1 tablet by mouth 3 (three) times daily.   potassium chloride 10 MEQ tablet Commonly known as: KLOR-CON Take 10 mEq by mouth daily.   sodium bicarbonate 650 MG tablet Take 650 mg by mouth 2 (two) times daily.   Vitamin D3 50 MCG (2000 UT) Tabs Take by mouth.        Objective:   BP (!) 170/69   Pulse 71   Temp 98.9 F (37.2 C) (Temporal)   Ht  5' 10"  (1.778 m)   Wt 178 lb 9.6 oz (81 kg)   SpO2 96%   BMI 25.63 kg/m   Wt Readings from Last 3 Encounters:  05/15/19 178 lb 9.6 oz (81 kg)  02/08/19 188 lb (85.3 kg)  10/14/18 189 lb 12.8 oz (86.1 kg)    Physical Exam Vitals signs and nursing note reviewed.  Constitutional:      General: He is not in acute distress.    Appearance: He is well-developed. He is not diaphoretic.  Eyes:     General: No scleral icterus.    Conjunctiva/sclera: Conjunctivae normal.  Neck:     Musculoskeletal: Neck supple.     Thyroid: No thyromegaly.  Cardiovascular:     Rate and Rhythm: Normal rate and regular rhythm.     Heart sounds: Normal heart sounds. No murmur.  Pulmonary:     Effort: Pulmonary effort is normal. No respiratory distress.     Breath sounds: Normal breath sounds. No wheezing.  Musculoskeletal: Normal range of motion.  Lymphadenopathy:     Cervical: No cervical adenopathy.  Skin:    General: Skin is warm and dry.     Findings: No rash.  Neurological:     Mental Status: He is alert and oriented to person, place, and time.     Coordination: Coordination normal.  Psychiatric:        Behavior: Behavior normal.       Assessment & Plan:   Problem List Items Addressed This Visit      Cardiovascular and Mediastinum   Hypertension   Relevant Medications   cloNIDine (CATAPRES) 0.2 MG tablet   amLODipine-atorvastatin (CADUET) 10-40 MG tablet   Other Relevant Orders   BMP8+EGFR   Accelerated secondary hypertension   Relevant Medications   cloNIDine (CATAPRES) 0.2 MG tablet   amLODipine-atorvastatin (CADUET) 10-40 MG tablet   Other Relevant Orders   BMP8+EGFR     Genitourinary   Chronic kidney disease (CKD), stage III (moderate) - Primary   Relevant Orders   BMP8+EGFR     Other   Hyperlipidemia LDL goal <100   Relevant Medications   cloNIDine (CATAPRES) 0.2 MG tablet   amLODipine-atorvastatin (CADUET) 10-40 MG tablet      No change in medication, mostly being  managed by nephrology, will keep the same, it seems to be monitored closely at home as well.  Recheck renal function because it was elevated last time was checked couple months ago. Follow up plan: Return in about 6 months (around 11/13/2019), or if symptoms worsen or fail to improve, for Hypertension and CKD and cholesterol recheck.  Counseling provided for all of the vaccine components Orders Placed This Encounter  Procedures  . BMP8+EGFR    James Pina, MD [facility] 05/15/2019, 2:33 PM

## 2019-05-16 LAB — BMP8+EGFR
BUN/Creatinine Ratio: 15 (ref 10–24)
BUN: 28 mg/dL — ABNORMAL HIGH (ref 8–27)
CO2: 30 mmol/L — ABNORMAL HIGH (ref 20–29)
Calcium: 9.3 mg/dL (ref 8.6–10.2)
Chloride: 95 mmol/L — ABNORMAL LOW (ref 96–106)
Creatinine, Ser: 1.84 mg/dL — ABNORMAL HIGH (ref 0.76–1.27)
GFR calc Af Amer: 41 mL/min/{1.73_m2} — ABNORMAL LOW (ref >59)
GFR calc non Af Amer: 36 mL/min/{1.73_m2} — ABNORMAL LOW (ref >59)
Glucose: 99 mg/dL (ref 65–99)
Potassium: 4 mmol/L (ref 3.5–5.2)
Sodium: 140 mmol/L (ref 134–144)

## 2019-06-05 DIAGNOSIS — I8312 Varicose veins of left lower extremity with inflammation: Secondary | ICD-10-CM | POA: Diagnosis not present

## 2019-06-05 DIAGNOSIS — I872 Venous insufficiency (chronic) (peripheral): Secondary | ICD-10-CM | POA: Diagnosis not present

## 2019-06-05 DIAGNOSIS — I8311 Varicose veins of right lower extremity with inflammation: Secondary | ICD-10-CM | POA: Diagnosis not present

## 2019-06-11 ENCOUNTER — Other Ambulatory Visit: Payer: Self-pay | Admitting: Family Medicine

## 2019-06-12 ENCOUNTER — Ambulatory Visit (INDEPENDENT_AMBULATORY_CARE_PROVIDER_SITE_OTHER): Payer: Medicare Other | Admitting: Surgery

## 2019-06-12 ENCOUNTER — Other Ambulatory Visit: Payer: Self-pay

## 2019-06-12 ENCOUNTER — Ambulatory Visit (HOSPITAL_COMMUNITY)
Admission: RE | Admit: 2019-06-12 | Discharge: 2019-06-12 | Disposition: A | Discharge status: Home / Self Care | Payer: Medicare Other | Source: Ambulatory Visit | Attending: Family | Admitting: Family

## 2019-06-12 ENCOUNTER — Encounter: Payer: Self-pay | Admitting: Surgery

## 2019-06-12 VITALS — BP 185/78 | HR 56 | Temp 97.8°F | Resp 20 | Ht 70.0 in | Wt 176.0 lb

## 2019-06-12 DIAGNOSIS — I6523 Occlusion and stenosis of bilateral carotid arteries: Secondary | ICD-10-CM

## 2019-06-12 NOTE — Progress Notes (Signed)
[facility] [place]  Patient name: James [name] MRN: [id number] DOB: [birth year] Sex: male   REASON FOR VISIT:    Follow up  [place]:    James [name] a 73 y.o.malereturns today for follow-up. He is status post left carotid endarterectomy for asymptomatic stenosis in November 2013. He is status post right carotid endarterectomy in 2008. In 2009 he underwent a right renal artery stenting secondary to elevated creatinine and blood pressure. His creatinine has remained stable. He does suffer from whitecoat hypertension. Last year he was identified to have a progressive stenosis within his right renal artery stent and on 12/03/2015 he underwent renal angiography. 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 The right kidney was perfused to an accessory vessel which did appear to opacify the main renal artery.   He has no complaints today.  He denies any neurologic symptoms such as numbness or weakness in either extremity, amaurosis fugax, or slurred speech.   PAST MEDICAL HISTORY:   Past Medical History:  Diagnosis Date  . Arthritis   . Carotid artery occlusion   . DVT (deep venous thrombosis) ([place]) 2008  . DVT (deep venous thrombosis) ([place])   . GERD (gastroesophageal reflux disease)    takes Protonix daily  . Gout    takes Allopurinol daily and Indomethacin prn  . Gout flare   . H/O renal artery stenosis   . H/O: GI bleed   . History of blood transfusion   . History of colon polyps   . History of gastric ulcer   . Hyperlipidemia    takes Lipitor daily  . Hypertension    takes Metoprolol and Amlodipine daily  . Joint pain   . Joint swelling      FAMILY HISTORY:   Family History  Problem Relation Age of Onset  . Hypertension Mother   . Hyperlipidemia Mother   . Other Mother        varicose veins  . Hyperlipidemia Father   . Hypertension Father    . Diabetes Father   . Clotting disorder Father   . Deep vein thrombosis Father   . Hypertension Sister   . Diabetes Sister   . Hyperlipidemia Sister   . Varicose Veins Sister   . Other Sister        Bleeding problems  . Hypertension Brother   . Hyperlipidemia Brother     SOCIAL HISTORY:   Social History   Tobacco Use  . Smoking status: Former Smoker    Quit date: 01/02/2000    Years since quitting: 19.4  . Smokeless tobacco: Former Systems developer    Quit date: 12/26/1999  Substance Use Topics  . Alcohol use: No    Comment: Quit in 1998     ALLERGIES:   Allergies  Allergen Reactions  . Plavix [Clopidogrel Bisulfate] Other (See Comments)    GI Bleed  . Uloric [Febuxostat]      CURRENT MEDICATIONS:   Current Outpatient Medications  Medication Sig Dispense Refill  . allopurinol (ZYLOPRIM) 100 MG tablet TAKE 2 TABLETS DAILY 180 tablet 0  . amLODipine-atorvastatin (CADUET) 10-40 MG tablet Take 1 tablet by mouth daily. 90 tablet 3  . aspirin 81 MG tablet Take 81 mg by mouth 2 (two) times daily with a meal.     . cetirizine (ZYRTEC) 5 MG tablet Take 1 tablet (5 mg total) by mouth daily. 20 tablet 0  . Cholecalciferol (VITAMIN D3) 50 MCG (2000 UT)  TABS Take by mouth.    . cloNIDine (CATAPRES) 0.2 MG tablet Take 0.2 mg by mouth 2 (two) times daily.    . fluticasone (FLONASE) 50 MCG/ACT nasal spray Place 2 sprays into both nostrils daily. 16 g 0  . furosemide (LASIX) 40 MG tablet Take 40 mg by mouth daily.    . hydrALAZINE (APRESOLINE) 100 MG tablet Take 1 tablet by mouth 3 (three) times daily.    . potassium chloride (K-DUR) 10 MEQ tablet Take 10 mEq by mouth daily.    . sodium bicarbonate 650 MG tablet Take 650 mg by mouth 2 (two) times daily.     No current facility-administered medications for this visit.     REVIEW OF SYSTEMS:   [X]  denotes positive finding, [ ]  denotes negative finding Cardiac  Comments:  Chest pain or chest pressure:    Shortness of breath upon exertion:     Short of breath when lying flat:    Irregular heart rhythm:        Vascular    Pain in calf, thigh, or hip brought on by ambulation:    Pain in feet at night that wakes you up from your sleep:     Blood clot in your veins:    Leg swelling:  x       Pulmonary    Oxygen at home:    Productive cough:     Wheezing:         Neurologic    Sudden weakness in arms or legs:     Sudden numbness in arms or legs:     Sudden onset of difficulty speaking or slurred speech:    Temporary loss of vision in one eye:     Problems with dizziness:         Gastrointestinal    Blood in stool:     Vomited blood:         Genitourinary    Burning when urinating:     Blood in urine:        Psychiatric    Major depression:         Hematologic    Bleeding problems:    Problems with blood clotting too easily:        Skin    Rashes or ulcers:        Constitutional    Fever or chills:      PHYSICAL EXAM:   There were no vitals filed for this visit.  GENERAL: The patient is a well-nourished male, in no acute distress. The vital signs are documented above. CARDIAC: There is a regular rate and rhythm.  PULMONARY: Non-labored respirations ABDOMEN: Soft and non-tender  MUSCULOSKELETAL: There are no major deformities or cyanosis. NEUROLOGIC: No focal weakness or paresthesias are detected. SKIN: There are no ulcers or rashes noted. PSYCHIATRIC: The patient has a normal affect.  STUDIES:   I have reviewed the following: BilateralCarotid: Patent carotid endarterectomy sites with velocities in the ICA                   consistent with a 1-39% stenosis.     Vertebrals:  Bilateral vertebral arteries demonstrate antegrade flow. Subclavians: Normal flow hemodynamics were seen in bilateral subclavian              arteries.  MEDICAL ISSUES:   Carotid: The patient has no residual stenosis.  He remains asymptomatic.  I will see him back in 2 years with a repeat carotid duplex.  Leia Alf, MD, FACS [facility] [phone] Pager [phone]

## 2019-06-13 ENCOUNTER — Ambulatory Visit (INDEPENDENT_AMBULATORY_CARE_PROVIDER_SITE_OTHER): Payer: Medicare Other | Admitting: Pulmonary Disease

## 2019-06-13 ENCOUNTER — Encounter: Payer: Self-pay | Admitting: Pulmonary Disease

## 2019-06-13 VITALS — BP 146/70 | HR 62 | Temp 98.1°F | Ht 68.5 in | Wt 178.0 lb

## 2019-06-13 DIAGNOSIS — G4733 Obstructive sleep apnea (adult) (pediatric): Secondary | ICD-10-CM

## 2019-06-13 DIAGNOSIS — Z9989 Dependence on other enabling machines and devices: Secondary | ICD-10-CM | POA: Diagnosis not present

## 2019-06-13 DIAGNOSIS — I6523 Occlusion and stenosis of bilateral carotid arteries: Secondary | ICD-10-CM

## 2019-06-13 NOTE — Patient Instructions (Signed)
Can review when you are eligible for CPAP supplies at [email or web address] and then look up CPAP supplies  Follow up in 1 year

## 2019-06-13 NOTE — Progress Notes (Signed)
[facility]  Chief Complaint  Patient presents with  . Follow-up    Wearing CPAP    Constitutional:  BP (!) 146/70 (BP Location: Right Arm, Cuff Size: Normal)   Pulse 62   Temp 98.1 F (36.7 C) (Temporal)   Ht 5' 8.5" (1.74 m) Comment: with shoes  Wt 178 lb (80.7 kg)   SpO2 97%   BMI 26.67 kg/m   Past Medical History:  HTN, HLD, PUD, Colon polyp, Renal artery stenosis s/p stent, Gout, GERD, DVT 2008, OA, PAD s/p CEA  Brief Summary:  James [name] is a 73 y.o. male former smoker with obstructive sleep apnea.  He uses CPAP nightly.  Has full face mask.  Mask leaks sometimes when he tries sleeping on his side.  He has tried other types of masks, but this seems to be most comfortable.  Not having sinus congestion, sore throat, or aerophagia.  Gets dry mouth and uses chewing gum during the day.  He has a Engineer, structural.  Physical Exam:   Appearance - well kempt   ENMT - clear nasal mucosa, midline nasal  septum, no oral exudates, no LAN, trachea midline, MP 4, high arched palate, elongated uvula  Respiratory - normal chest wall, normal respiratory effort, no accessory muscle use, no wheeze/rales  CV - s1s2 regular rate and rhythm, no murmurs, no peripheral edema, radial pulses symmetric  GI - soft, non tender, no masses  Lymph - no adenopathy noted in neck and axillary areas  MSK - normal gait  Ext - no cyanosis, clubbing, or joint inflammation noted  Skin - no rashes, lesions, or ulcers  Neuro - normal strength, oriented x 3  Psych - normal mood and affect    Assessment/Plan:   Obstructive sleep apnea. - he is compliant with CPAP and reports benefit - discussed options to assist with mask fit - continue auto CPAP   Patient Instructions  Can review when you are eligible for CPAP supplies at [email or web address] and then look up CPAP supplies  Follow up in 1 year  A total of  16 minutes were spent face to face with the  patient and more than half of that time involved counseling or coordination of care.   Chesley Mires, MD [facility] Pager: [phone] 06/13/2019, 10:10 AM  Flow Sheet    Sleep tests:  HST 01/14/16 >> AHI 25.9, SaO2 low 77% Auto CPAP 05/13/19 to 06/11/19 >> used on 30 of 30 nights with average 9 hrs 13 min.  Average AHI 7.8 with median CPAP 8 and 95 th percentile CPAP 13 cm H2O.  Some air leak.  Medications:   Allergies as of 06/13/2019      Reactions   Plavix [clopidogrel Bisulfate] Other (See Comments)   GI Bleed   Uloric [febuxostat]       Medication List       Accurate as of June 13, 2019 10:10 AM. If you have any questions, ask your nurse or doctor.        STOP taking these medications   cetirizine 5 MG tablet Commonly known as: ZYRTEC Stopped by: Chesley Mires, MD   fluticasone 50 MCG/ACT nasal spray Commonly known as: FLONASE Stopped by: Chesley Mires, MD     TAKE these medications   allopurinol 100 MG tablet Commonly known as: ZYLOPRIM TAKE 2 TABLETS DAILY   amLODipine 10 MG tablet Commonly known as: NORVASC Take 10 mg by mouth daily.   aspirin 81 MG  tablet Take 81 mg by mouth 2 (two) times daily with a meal.   atorvastatin 40 MG tablet Commonly known as: LIPITOR Take 40 mg by mouth daily.   cloNIDine 0.2 MG tablet Commonly known as: CATAPRES Take 0.2 mg by mouth 2 (two) times daily.   furosemide 40 MG tablet Commonly known as: LASIX Take 65 mg by mouth daily.   hydrALAZINE 100 MG tablet Commonly known as: APRESOLINE Take 1 tablet by mouth 3 (three) times daily.   metoprolol tartrate 50 MG tablet Commonly known as: LOPRESSOR Take 75 mg by mouth 2 (two) times daily.   potassium chloride 10 MEQ tablet Commonly known as: KLOR-CON Take 10 mEq by mouth daily.   sodium bicarbonate 650 MG tablet Take 650 mg by mouth 2 (two) times daily.   Vitamin D3 50 MCG (2000 UT) Tabs Take by mouth.       Past Surgical  History:  He  has a past surgical history that includes Renal artery stent (2009); Carotid endarterectomy (2008); small intestine removed (2001); Esophagogastroduodenoscopy; Cholecystectomy (mid 90's); Colonoscopy; Hernia repair; Endarterectomy (06/02/2012); Patch angioplasty (06/02/2012); Colon surgery; and Cardiac catheterization (Right, 12/03/2015).  Family History:  His family history includes Clotting disorder in his father; Deep vein thrombosis in his father; Diabetes in his father and sister; Hyperlipidemia in his brother, father, mother, and sister; Hypertension in his brother, father, mother, and sister; Other in his mother and sister; Varicose Veins in his sister.  Social History:  He  reports that he quit smoking about 19 years ago. He quit smokeless tobacco use about 19 years ago. He reports that he does not drink alcohol or use drugs.

## 2019-06-20 DIAGNOSIS — L814 Other melanin hyperpigmentation: Secondary | ICD-10-CM | POA: Diagnosis not present

## 2019-06-20 DIAGNOSIS — L820 Inflamed seborrheic keratosis: Secondary | ICD-10-CM | POA: Diagnosis not present

## 2019-06-20 DIAGNOSIS — D485 Neoplasm of uncertain behavior of skin: Secondary | ICD-10-CM | POA: Diagnosis not present

## 2019-06-20 DIAGNOSIS — D1801 Hemangioma of skin and subcutaneous tissue: Secondary | ICD-10-CM | POA: Diagnosis not present

## 2019-06-20 DIAGNOSIS — L821 Other seborrheic keratosis: Secondary | ICD-10-CM | POA: Diagnosis not present

## 2019-06-26 ENCOUNTER — Other Ambulatory Visit: Payer: Self-pay

## 2019-06-26 ENCOUNTER — Other Ambulatory Visit: Payer: Medicare Other

## 2019-06-26 DIAGNOSIS — N184 Chronic kidney disease, stage 4 (severe): Secondary | ICD-10-CM | POA: Diagnosis not present

## 2019-07-03 DIAGNOSIS — R600 Localized edema: Secondary | ICD-10-CM | POA: Diagnosis not present

## 2019-07-03 DIAGNOSIS — I701 Atherosclerosis of renal artery: Secondary | ICD-10-CM | POA: Diagnosis not present

## 2019-07-03 DIAGNOSIS — N2581 Secondary hyperparathyroidism of renal origin: Secondary | ICD-10-CM | POA: Diagnosis not present

## 2019-07-03 DIAGNOSIS — E876 Hypokalemia: Secondary | ICD-10-CM | POA: Diagnosis not present

## 2019-07-03 DIAGNOSIS — I129 Hypertensive chronic kidney disease with stage 1 through stage 4 chronic kidney disease, or unspecified chronic kidney disease: Secondary | ICD-10-CM | POA: Diagnosis not present

## 2019-07-03 DIAGNOSIS — N184 Chronic kidney disease, stage 4 (severe): Secondary | ICD-10-CM | POA: Diagnosis not present

## 2019-07-24 DIAGNOSIS — N401 Enlarged prostate with lower urinary tract symptoms: Secondary | ICD-10-CM | POA: Diagnosis not present

## 2019-08-04 IMAGING — MR MR ABDOMEN W/O CM
8 series · 36 of 48 positions shown · non-contrast
Comparison: Ultrasound 07/15/2018

CLINICAL DATA: Left renal cyst.

EXAM:
MRI ABDOMEN WITHOUT CONTRAST
TECHNIQUE: Multiplanar multisequence MR imaging was performed without the
administration of intravenous contrast.

[Series 3: T2 · coronal · 5.0mm · 1.56mm/px · 3 of 20 slices shown (1 of 3)]
[im 1/20]
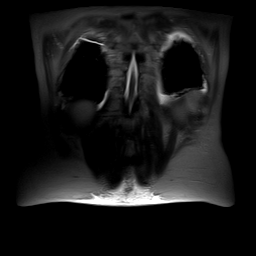
[im 10/20]
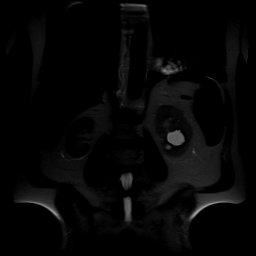
[im 20/20]
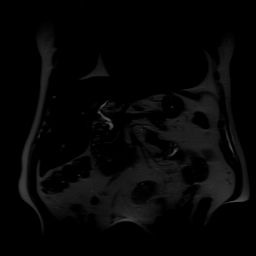

[Series 4: T2 · axial · 5.0mm · 1.37mm/px · z∈[-111,+77]mm · 4 of 30 slices shown (2 of 3)]
[im 1/30]
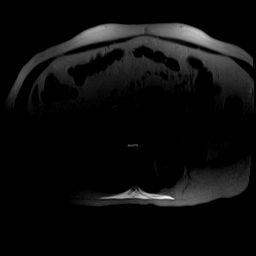
[im 10/30]
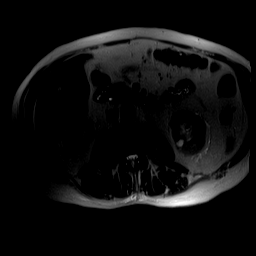
[im 20/30]
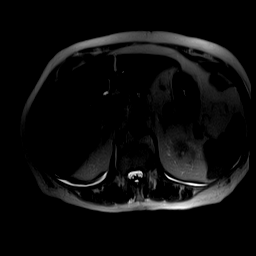
[im 30/30]
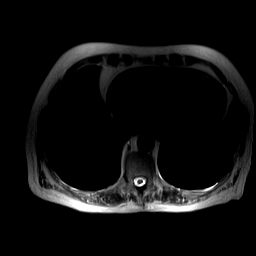

[Series 5: axial in out · axial · 5.5mm · 0.74mm/px · z∈[-93,+53]mm · 7 of 48 slices shown]
[im 1/48]
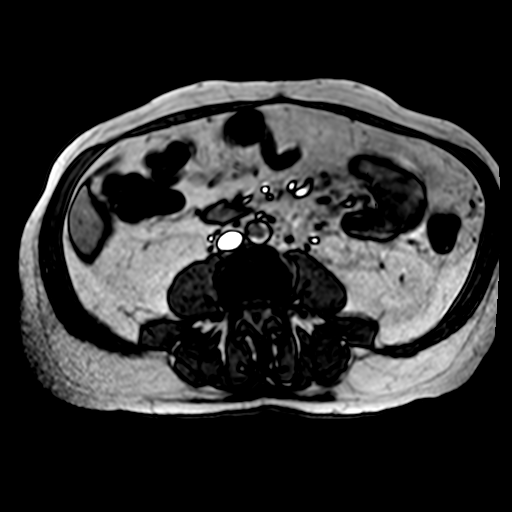
[im 8/48]
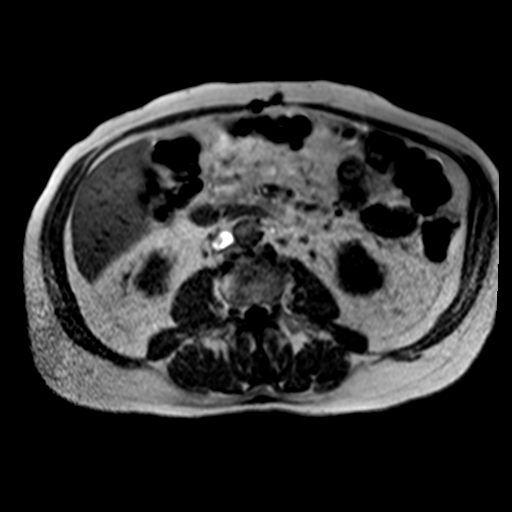
[im 16/48]
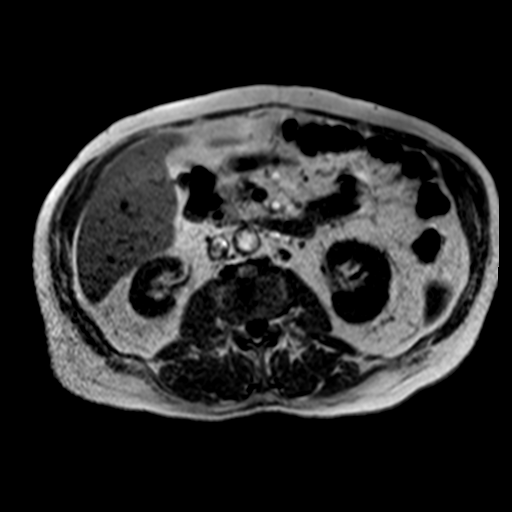
[im 24/48]
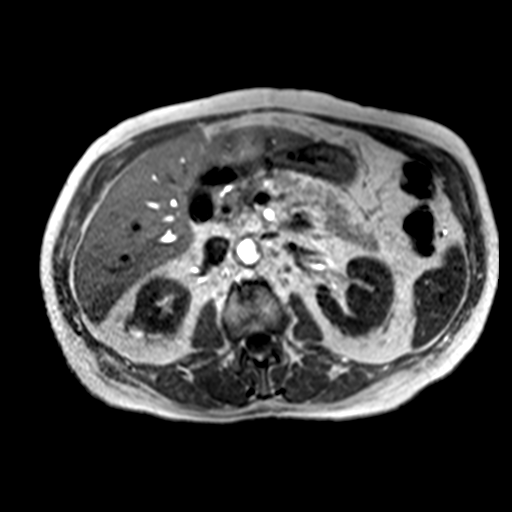
[im 32/48]
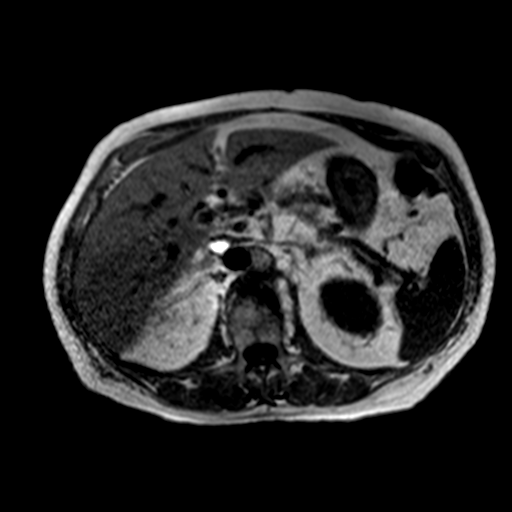
[im 40/48]
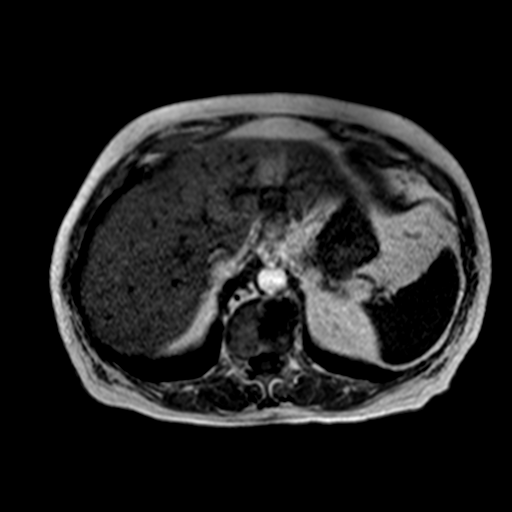
[im 48/48]
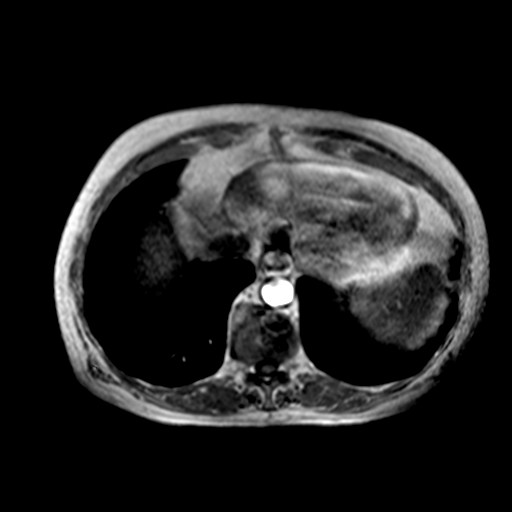

[Series 6: axial tru fisp · axial · 4.0mm · 1.48mm/px · z∈[-90,+61]mm · 4 of 30 slices shown]
[im 1/30]
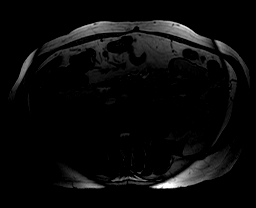
[im 10/30]
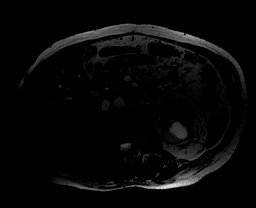
[im 20/30]
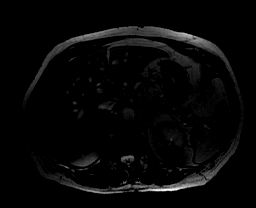
[im 30/30]
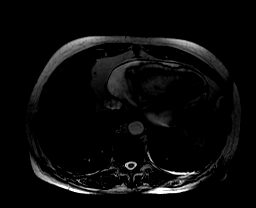

[Series 7: ep2d_[id number]_500_800_p2 · axial · 6.0mm · 1.98mm/px · z∈[-84,+103]mm · 8 of 75 slices shown]
[im 1/75]
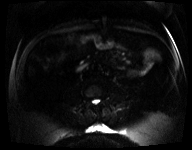
[im 15/75]
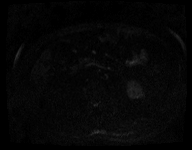
[im 23/75]
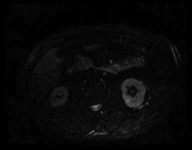
[im 30/75]
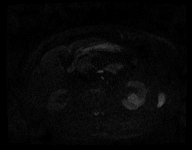
[im 45/75]
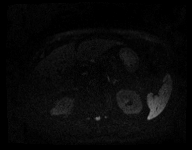
[im 52/75]
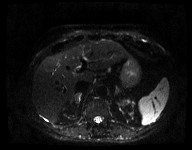
[im 60/75]
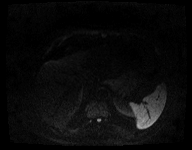
[im 75/75]
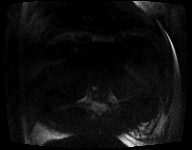

[Series 8: [id number]_500_[id number]_adc · axial · 6.0mm · 1.98mm/px · z∈[-84,+103]mm · 4 of 25 slices shown]
[im 1/25]
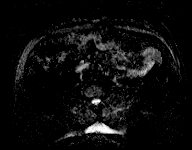
[im 9/25]
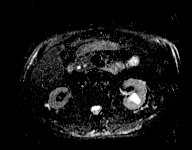
[im 17/25]
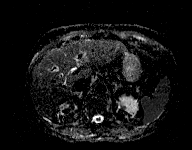
[im 25/25]
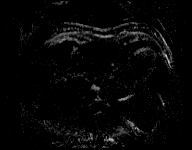

[Series 9: T2 · axial · 6.5mm · 0.74mm/px · z∈[-85,+117]mm · 4 of 27 slices shown (3 of 3)]
[im 1/27]
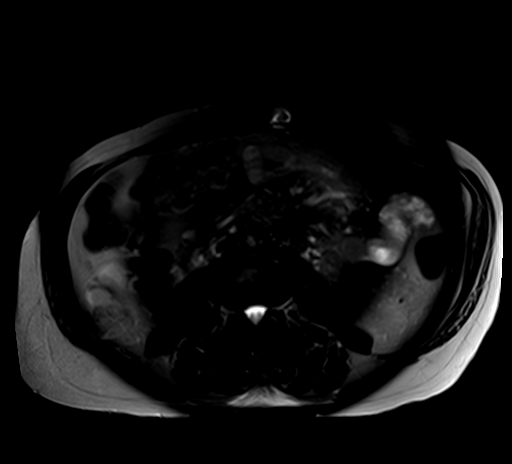
[im 9/27]
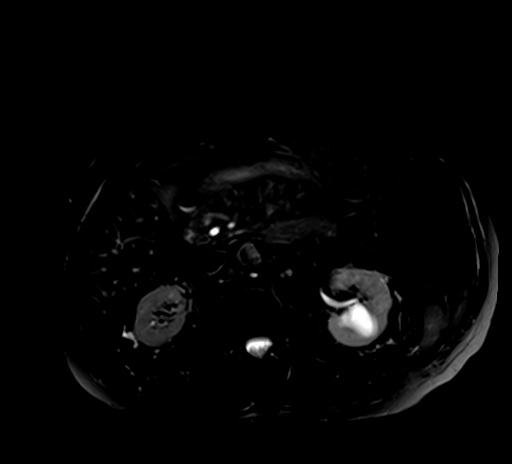
[im 18/27]
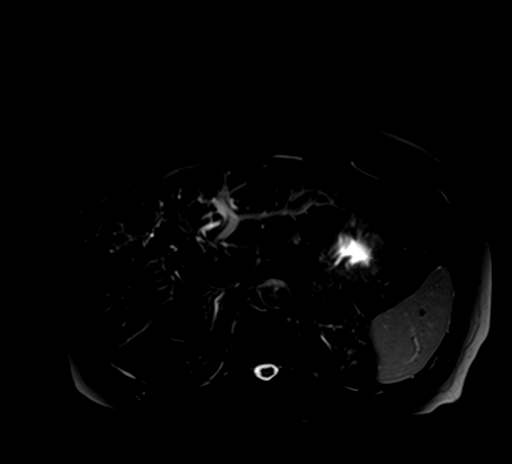
[im 27/27]
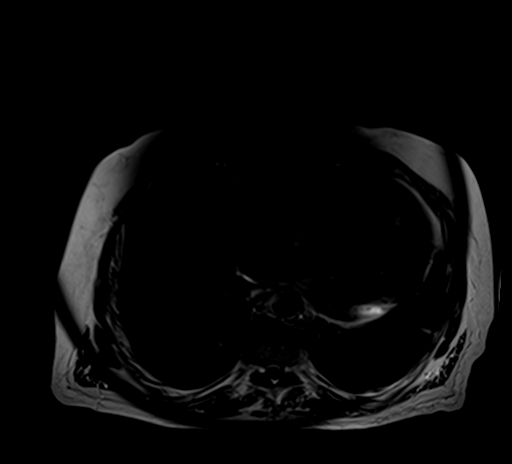

[Series 10: T1 dynamic · axial · non-contrast · 2.5mm · 0.78mm/px · z∈[-113,-75]mm · 2 of 80 slices shown]
[im 1/80]
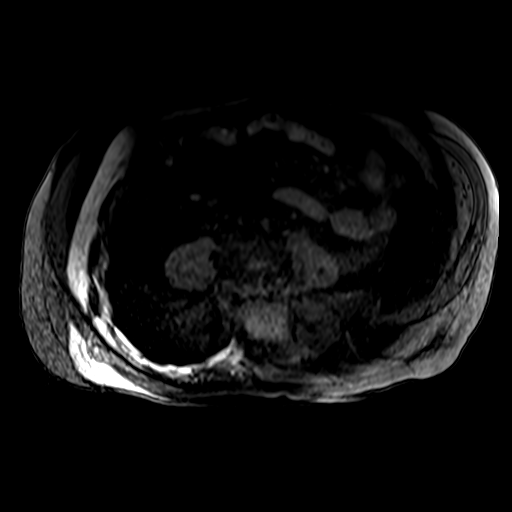
[im 16/80]
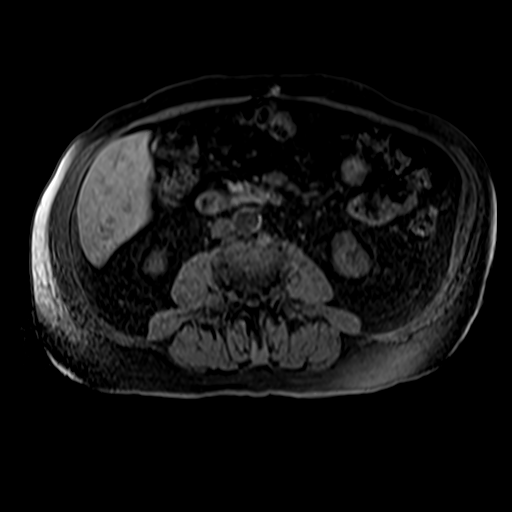

[36 of 48 positions shown; findings below may reference images not displayed]

FINDINGS: Lower chest: Heart is enlarged.  Tiny left pleural effusion.

Hepatobiliary: 3 mm T2 hyperintensity in the right liver is too
small to characterize but likely a cyst. Gallbladder surgically
absent. No intrahepatic or extrahepatic biliary dilation.

Pancreas: Pancreas divisum anatomy again noted. No dilatation of the
main duct. 8 mm simple appearing cyst identified anterior head of
pancreas ([DATE]) cluster of tiny cysts measuring up to about 4 mm
identified in the uncinate process of the pancreas (20, [DATE]).

Spleen:  No splenomegaly. No focal mass lesion.

Adrenals/Urinary Tract:  No adrenal nodule or mass.

1.7 cm exophytic well-defined homogeneous lesion is identified in
the upper pole the right kidney. This has homogeneously increased T2
signal and low signal intensity on T1 weighted imaging with some
dependent T1 shortening likely related to proteinaceous debris or
hemorrhage. No grossly concerning abnormality identified in the
right kidney.

2.5 x 3.1 cm homogeneous well-defined T1 hypointense, T2
hyperintense lesion is identified in the interpolar left kidney.
This likely corresponds to the lesion seen on recent ultrasound. By
noncontrast MRI today, there is no evidence for internal
architecture or signal heterogeneity in this lesion and imaging
features are most suggestive of a simple cyst. Several other tiny
simple appearing cystic lesions are seen in the left kidney.

Stomach/Bowel: Stomach is nondistended. No gastric wall thickening.
No evidence of outlet obstruction. Duodenum is normally positioned
as is the ligament of Treitz. No small bowel or colonic dilatation
within the visualized abdomen.

Vascular/Lymphatic: No abdominal aortic aneurysm. There is no
gastrohepatic or hepatoduodenal ligament lymphadenopathy. No
intraperitoneal or retroperitoneal lymphadenopathy.

Other:  No intraperitoneal free fluid.

Musculoskeletal: No abnormal marrow signal within the visualized
bony anatomy.
IMPRESSION: 2.5 x 3.1 cm homogeneous well-defined lesion in the interpolar left
kidney. Lack of intravenous contrast hinders definitive assessment,
but noncontrast MR features are most suggestive of a simple cyst.
This corresponds to the abnormality seen on ultrasound previously.
The apparent solid components within this lesion on ultrasound
likely reflected adjacent renal parenchyma.

Other bilateral small renal cysts.

Pancreas divisum.

## 2019-08-21 DIAGNOSIS — N184 Chronic kidney disease, stage 4 (severe): Secondary | ICD-10-CM | POA: Diagnosis not present

## 2019-09-11 ENCOUNTER — Other Ambulatory Visit: Payer: Self-pay | Admitting: Family Medicine

## 2019-09-21 ENCOUNTER — Ambulatory Visit (INDEPENDENT_AMBULATORY_CARE_PROVIDER_SITE_OTHER): Payer: Medicare Other | Admitting: *Deleted

## 2019-09-21 DIAGNOSIS — Z0000 Encounter for general adult medical examination without abnormal findings: Secondary | ICD-10-CM

## 2019-09-21 NOTE — Patient Instructions (Signed)

## 2019-09-21 NOTE — Progress Notes (Signed)
MEDICARE ANNUAL WELLNESS VISIT  09/21/2019  Telephone Visit Disclaimer This Medicare AWV was conducted by telephone due to national recommendations for restrictions regarding the COVID-19 Pandemic (e.g. social distancing).  I verified, using two identifiers, that I am speaking with James [name] or their authorized healthcare agent. I discussed the limitations, risks, security, and privacy concerns of performing an evaluation and management service by telephone and the potential availability of an in-person appointment in the future. The patient expressed understanding and agreed to proceed.   Subjective:  James [name] is a 74 y.o. male patient of [name], [name], MD who had a Medicare Annual Wellness Visit today via telephone. James [name] is Retired and lives with their spouse. he has 2 children. he reports that he is socially active and does interact with friends/family regularly. 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  Patient Care Team: [name], [name], MD as PCP - General (Family Medicine) [name], [name], MD as Consulting Physician (Family Medicine) Serafina Mitchell, MD as Consulting Physician (Vascular Surgery) Luberta Mutter, MD as Consulting Physician (Ophthalmology) [place], [place]  Advanced Directives 09/21/2019 06/12/2019 04/11/2019 09/19/2018 06/06/2018 10/18/2017 06/23/2017  Does Patient Have a Medical Advance Directive? Yes Yes No Yes Yes Yes Yes  Type of Advance Directive Living will Living will - Living will [place];Living will [place];Living will Living will  Does patient want to make changes to medical advance directive? No - Patient declined No - Patient declined - No - Patient declined - - -  Copy of [place] in [place]  Would patient like information on creating a medical advance directive? - - No - Patient declined -  - - -  Pre-existing out of facility DNR order (yellow form or pink MOST form) - - - - - - -    Hospital Utilization Over the Past 12 Months: # of hospitalizations or ER visits: 0 # of surgeries: 0  Review of Systems    Patient reports that his overall health is unchanged compared to last year.  History obtained from chart review  Patient Reported Readings (BP, Pulse, CBG, Weight, etc) none  Pain Assessment Pain : No/denies pain     Current Medications & Allergies (verified) Allergies as of 09/21/2019      Reactions   Plavix [clopidogrel Bisulfate] Other (See Comments)   GI Bleed   Uloric [febuxostat]       Medication List       Accurate as of September 21, 2019 10:25 AM. If you have any questions, ask your nurse or doctor.        STOP taking these medications   metoprolol tartrate 50 MG tablet Commonly known as: LOPRESSOR   potassium chloride 10 MEQ tablet Commonly known as: KLOR-CON   sodium bicarbonate 650 MG tablet     TAKE these medications   allopurinol 100 MG tablet Commonly known as: ZYLOPRIM TAKE 2 TABLETS DAILY   amLODipine-atorvastatin 10-40 MG tablet Commonly known as: CADUET Take 1 tablet by mouth daily.   aspirin 81 MG tablet Take 81 mg by mouth 2 (two) times daily with a meal.   cloNIDine 0.2 MG tablet Commonly known as: CATAPRES Take 0.2 mg by mouth 2 (two) times daily.   doxazosin 2 MG tablet Commonly known as: CARDURA Take 2 mg by mouth daily.   furosemide 40 MG tablet Commonly known  as: LASIX Take 65 mg by mouth daily.   hydrALAZINE 100 MG tablet Commonly known as: APRESOLINE Take 1 tablet by mouth 3 (three) times daily.   Vitamin D3 50 MCG (2000 UT) Tabs Take by mouth.       History (reviewed): Past Medical History:  Diagnosis Date  . Arthritis   . Carotid artery occlusion   . DVT (deep venous thrombosis) ([place]) 2008  . DVT (deep venous thrombosis) ([place])   . GERD (gastroesophageal reflux disease)    takes Protonix  daily  . Gout    takes Allopurinol daily and Indomethacin prn  . Gout flare   . H/O renal artery stenosis   . H/O: GI bleed   . History of blood transfusion   . History of colon polyps   . History of gastric ulcer   . Hyperlipidemia    takes Lipitor daily  . Hypertension    takes Metoprolol and Amlodipine daily  . Joint pain   . Joint swelling    Past Surgical History:  Procedure Laterality Date  . CAROTID ENDARTERECTOMY  2008   right CEA  . CHOLECYSTECTOMY  mid 90's  . COLON SURGERY    . COLONOSCOPY    . ENDARTERECTOMY  06/02/2012   Procedure: ENDARTERECTOMY CAROTID;  Surgeon: Serafina Mitchell, MD;  [facility];  Service: Vascular;  Laterality: Left;  . ESOPHAGOGASTRODUODENOSCOPY    . HERNIA REPAIR     umbilical hernia  . PATCH ANGIOPLASTY  06/02/2012   Procedure: PATCH ANGIOPLASTY;  Surgeon: Serafina Mitchell, MD;  Location: [facility];  Service: Vascular;  Laterality: Left;  using Vascu-Guard Patch  . PERIPHERAL VASCULAR CATHETERIZATION Right 12/03/2015   Procedure: Peripheral Vascular Balloon Angioplasty;  Surgeon: Serafina Mitchell, MD;  Location: [facility];  Service: Cardiovascular;  Laterality: Right;  RENAL unsuccessful  . RENAL ARTERY STENT  2009   Right renal artery stenting-  Right Kidney  . small intestine removed  2001   d/t random gi bleeding   Family History  Problem Relation Age of Onset  . Hypertension Mother   . Hyperlipidemia Mother   . Other Mother        varicose veins  . Hyperlipidemia Father   . Hypertension Father   . Diabetes Father   . Clotting disorder Father   . Deep vein thrombosis Father   . Hypertension Sister   . Diabetes Sister   . Hyperlipidemia Sister   . Varicose Veins Sister   . Other Sister        Bleeding problems  . Hypertension Brother   . Hyperlipidemia Brother    Social History   Socioeconomic History  . Marital status: Married    Spouse name: [place]  . Number of children: 2  . Years of education: 17  . Highest  education level: Associate degree: occupational, Hotel manager, or vocational program  Occupational History  . Occupation: Retired  Tobacco Use  . Smoking status: Former Smoker    Packs/day: 1.50    Years: 36.00    Pack years: 54.00    Types: Cigarettes    Quit date: 12/26/1999    Years since quitting: 19.7  . Smokeless tobacco: Former Systems developer    Types: [place] date: 12/26/1999  Substance and Sexual Activity  . Alcohol use: No    Comment: Quit in 1998  . Drug use: No  . Sexual activity: Yes    Birth control/protection: None  Other Topics Concern  .  Not on file  Social History Narrative  . Not on file   Social Determinants of Health   Financial Resource Strain: Low Risk   . Difficulty of Paying Living Expenses: Not hard at all  Food Insecurity: No Food Insecurity  . Worried About Charity fundraiser in the Last Year: Never true  . Ran Out of Food in the Last Year: Never true  Transportation Needs: No Transportation Needs  . Lack of Transportation (Medical): No  . Lack of Transportation (Non-Medical): No  Physical Activity: Inactive  . Days of Exercise per Week: 0 days  . Minutes of Exercise per Session: 0 min  Stress: No Stress Concern Present  . Feeling of Stress : Not at all  Social Connections: Not Isolated  . Frequency of Communication with Friends and Family: More than three times a week  . Frequency of Social Gatherings with Friends and Family: More than three times a week  . Attends Religious Services: More than 4 times per year  . Active Member of Clubs or Organizations: Yes  . Attends Archivist Meetings: More than 4 times per year  . Marital Status: Married    Activities of Daily Living In your present state of health, do you have any difficulty performing the following activities: 09/21/2019  Hearing? N  Vision? Y  Comment has cataract on right eye-gets yearly eye exam  Difficulty concentrating or making decisions? N  Walking or climbing stairs? N    Dressing or bathing? N  Doing errands, shopping? N  Preparing Food and eating ? N  Using the Toilet? N  In the past six months, have you accidently leaked urine? N  Do you have problems with loss of bowel control? N  Managing your Medications? N  Managing your Finances? N  Housekeeping or managing your Housekeeping? N  Some recent data might be hidden    Patient Education/ Literacy How often do you need to have someone help you when you read instructions, pamphlets, or other written materials from your doctor or pharmacy?: 1 - Never What is the last grade level you completed in school?: Associates Degree  Exercise Current Exercise Habits: The patient does not participate in regular exercise at present, Exercise limited by: None identified  Diet Patient reports consuming 2 meals a day and 1 snack(s) a day Patient reports that his primary diet is: Regular Patient reports that she does have regular access to food.   Depression Screen PHQ 2/9 Scores 09/21/2019 05/15/2019 09/19/2018 09/05/2018 01/21/2018 10/21/2017 09/30/2017  PHQ - 2 Score 0 0 1 1 0 0 0  PHQ- 9 Score - - - - - - -     Fall Risk Fall Risk  09/21/2019 05/15/2019 09/19/2018 09/05/2018 01/21/2018  Falls in the past year? 0 0 0 0 No  Comment - - - - -     Objective:  James [name] seemed alert and oriented and he participated appropriately during our telephone visit.  Blood Pressure Weight BMI  BP Readings from Last 3 Encounters:  06/13/19 (!) 146/70  06/12/19 (!) 185/78  05/15/19 (!) 170/69   Wt Readings from Last 3 Encounters:  06/13/19 178 lb (80.7 kg)  06/12/19 176 lb (79.8 kg)  05/15/19 178 lb 9.6 oz (81 kg)   BMI Readings from Last 1 Encounters:  06/13/19 26.67 kg/m    *Unable to obtain current vital signs, weight, and BMI due to telephone visit type  Hearing/Vision  . James [name] did not seem to  have difficulty with hearing/understanding during the telephone conversation . Reports that he has had a formal  eye exam by an eye care professional within the past year . Reports that he has not had a formal hearing evaluation within the past year *Unable to fully assess hearing and vision during telephone visit type  Cognitive Function: 6CIT Screen 09/21/2019  What Year? 0 points  What month? 0 points  What time? 0 points  Count back from 20 0 points  Months in reverse 0 points  Repeat phrase 2 points  Total Score 2   (Normal:0-7, Significant for Dysfunction: >8)  Normal Cognitive Function Screening: Yes   Immunization & Health Maintenance Record Immunization History  Administered Date(s) Administered  . Influenza,inj,Quad PF,6+ Mos 06/12/2014, 06/14/2015, 06/09/2017  . Influenza-Unspecified 05/11/2019  . Pneumococcal Conjugate-13 06/14/2015  . Pneumococcal Polysaccharide-23 06/09/2017  . Td 04/24/2011  . Tdap 04/24/2011    Health Maintenance  Topic Date Due  . Samul Dada  04/23/2021  . COLONOSCOPY  07/10/2027  . INFLUENZA VACCINE  Completed  . Hepatitis C Screening  Completed  . PNA vac Low Risk Adult  Completed       Assessment  This is a routine wellness examination for James [name].  Health Maintenance: Due or Overdue There are no preventive care reminders to display for this patient.  James [name] does not need a referral for Community Assistance: Care Management:   no Social Work:    no Prescription Assistance:  no Nutrition/Diabetes Education:  no   Plan:  Personalized Goals Goals Addressed            This Visit's Progress   . DIET - INCREASE WATER INTAKE       Try to drink 6-8 glasses of water daily      Personalized Health Maintenance & Screening Recommendations  Advanced directives: has an advanced directive - a copy HAS NOT been provided. Shingles vaccine  Lung Cancer Screening Recommended: no (Low Dose CT Chest recommended if Age 35-80 years, 30 pack-year currently smoking OR have quit w/in past 15 years) Hepatitis C Screening  recommended: no HIV Screening recommended: no  Advanced Directives: Written information was not prepared per patient's request.  Referrals & Orders No orders of the defined types were placed in this encounter.   Follow-up Plan . Follow-up with [name], [name], MD as planned . Bring a copy of your Advanced Directives in for our records . Consider Shingles vaccine at your next visit with your PCP   I have personally reviewed and noted the following in the patient's chart:   . Medical and social history . Use of alcohol, tobacco or illicit drugs  . Current medications and supplements . Functional ability and status . Nutritional status . Physical activity . Advanced directives . List of other physicians . Hospitalizations, surgeries, and ER visits in previous 12 months . Vitals . Screenings to include cognitive, depression, and falls . Referrals and appointments  In addition, I have reviewed and discussed with James [name] certain preventive protocols, quality metrics, and best practice recommendations. A written personalized care plan for preventive services as well as general preventive health recommendations is available and can be mailed to the patient at his request.      Milas Hock, LPN  4/33/2951

## 2019-09-28 DIAGNOSIS — N184 Chronic kidney disease, stage 4 (severe): Secondary | ICD-10-CM | POA: Diagnosis not present

## 2019-09-28 DIAGNOSIS — Z23 Encounter for immunization: Secondary | ICD-10-CM | POA: Diagnosis not present

## 2019-10-04 DIAGNOSIS — N2581 Secondary hyperparathyroidism of renal origin: Secondary | ICD-10-CM | POA: Diagnosis not present

## 2019-10-04 DIAGNOSIS — R600 Localized edema: Secondary | ICD-10-CM | POA: Diagnosis not present

## 2019-10-04 DIAGNOSIS — N184 Chronic kidney disease, stage 4 (severe): Secondary | ICD-10-CM | POA: Diagnosis not present

## 2019-10-04 DIAGNOSIS — I129 Hypertensive chronic kidney disease with stage 1 through stage 4 chronic kidney disease, or unspecified chronic kidney disease: Secondary | ICD-10-CM | POA: Diagnosis not present

## 2019-10-04 DIAGNOSIS — E876 Hypokalemia: Secondary | ICD-10-CM | POA: Diagnosis not present

## 2019-10-04 DIAGNOSIS — N281 Cyst of kidney, acquired: Secondary | ICD-10-CM | POA: Diagnosis not present

## 2019-10-30 DIAGNOSIS — N184 Chronic kidney disease, stage 4 (severe): Secondary | ICD-10-CM | POA: Diagnosis not present

## 2019-11-03 DIAGNOSIS — Z23 Encounter for immunization: Secondary | ICD-10-CM | POA: Diagnosis not present

## 2019-11-13 ENCOUNTER — Other Ambulatory Visit: Payer: Self-pay

## 2019-11-13 ENCOUNTER — Encounter: Payer: Self-pay | Admitting: Family Medicine

## 2019-11-13 ENCOUNTER — Ambulatory Visit (INDEPENDENT_AMBULATORY_CARE_PROVIDER_SITE_OTHER): Payer: Medicare Other | Admitting: Family Medicine

## 2019-11-13 VITALS — BP 157/66 | HR 64 | Temp 98.7°F | Ht 68.5 in | Wt 177.0 lb

## 2019-11-13 DIAGNOSIS — K2100 Gastro-esophageal reflux disease with esophagitis, without bleeding: Secondary | ICD-10-CM | POA: Diagnosis not present

## 2019-11-13 DIAGNOSIS — E785 Hyperlipidemia, unspecified: Secondary | ICD-10-CM | POA: Diagnosis not present

## 2019-11-13 DIAGNOSIS — R5383 Other fatigue: Secondary | ICD-10-CM | POA: Diagnosis not present

## 2019-11-13 DIAGNOSIS — G4710 Hypersomnia, unspecified: Secondary | ICD-10-CM

## 2019-11-13 DIAGNOSIS — I10 Essential (primary) hypertension: Secondary | ICD-10-CM | POA: Diagnosis not present

## 2019-11-13 DIAGNOSIS — N1832 Chronic kidney disease, stage 3b: Secondary | ICD-10-CM

## 2019-11-13 NOTE — Progress Notes (Signed)
BP (!) 157/66   Pulse 64   Temp 98.7 F (37.1 C) (Temporal)   Ht 5' 8.5" (1.74 m)   Wt 177 lb (80.3 kg)   BMI 26.52 kg/m    Subjective:   Patient ID: James [name], male    DOB: 26-Apr-1946, 74 y.o.   MRN: [id number]  HPI: James [name] is a 74 y.o. male presenting on 11/13/2019 for Medical Management of Chronic Issues   HPI Hypertension Patient is currently on amlodipine and clonidine and doxazosin and hydralazine and furosemide, and their blood pressure today is 157/66. Patient denies any lightheadedness or dizziness. Patient denies headaches, blurred vision, chest pains, shortness of breath, or weakness. Denies any side effects from medication and is content with current medication.   Hyperlipidemia Patient is coming in for recheck of his hyperlipidemia. The patient is currently taking no medication currently we are monitoring for now. They deny any issues with myalgias or history of liver damage from it. They deny any focal numbness or weakness or chest pain.   GERD Patient is currently on no medication currently is doing well.  She denies any major symptoms or abdominal pain or belching or burping. She denies any blood in her stool or lightheadedness or dizziness.   Patient has CKD stage III and we are monitoring, he does have renal artery stenosis and has a urologist that is monitoring and helping with this.  Patient complains of hypersomnia and is increased recently, they did increase his clonidine dose recently to control his blood pressure more, he is also on doxazosin which can cause the fatigue as well.  We will check some blood work to see if there is any other issues.  Relevant past medical, surgical, family and social history reviewed and updated as indicated. Interim medical history since our last visit reviewed. Allergies and medications reviewed and updated.  Review of Systems  Constitutional: Positive for fatigue. Negative for chills and fever.    Respiratory: Negative for shortness of breath and wheezing.   Cardiovascular: Negative for chest pain and leg swelling.  Musculoskeletal: Negative for back pain and gait problem.  Skin: Negative for rash.  Neurological: Negative for dizziness, weakness and light-headedness.  All other systems reviewed and are negative.   Per HPI unless specifically indicated above   Allergies as of 11/13/2019      Reactions   Plavix [clopidogrel Bisulfate] Other (See Comments)   GI Bleed   Uloric [febuxostat]       Medication List       Accurate as of November 13, 2019 10:11 AM. If you have any questions, ask your nurse or doctor.        allopurinol 100 MG tablet Commonly known as: ZYLOPRIM TAKE 2 TABLETS DAILY   amLODipine-atorvastatin 10-40 MG tablet Commonly known as: CADUET Take 1 tablet by mouth daily.   aspirin 81 MG tablet Take 81 mg by mouth 2 (two) times daily with a meal.   cloNIDine 0.2 MG tablet Commonly known as: CATAPRES Take 0.2 mg by mouth 2 (two) times daily.   doxazosin 2 MG tablet Commonly known as: CARDURA Take 2 mg by mouth daily.   furosemide 40 MG tablet Commonly known as: LASIX Take 65 mg by mouth daily.   hydrALAZINE 100 MG tablet Commonly known as: APRESOLINE Take 1 tablet by mouth 3 (three) times daily.   Vitamin D3 50 MCG (2000 UT) Tabs Take by mouth.        Objective:   BP Marland Kitchen)  157/66   Pulse 64   Temp 98.7 F (37.1 C) (Temporal)   Ht 5' 8.5" (1.74 m)   Wt 177 lb (80.3 kg)   BMI 26.52 kg/m   Wt Readings from Last 3 Encounters:  11/13/19 177 lb (80.3 kg)  06/13/19 178 lb (80.7 kg)  06/12/19 176 lb (79.8 kg)    Physical Exam Vitals and nursing note reviewed.  Constitutional:      General: He is not in acute distress.    Appearance: He is well-developed. He is not diaphoretic.  Eyes:     General: No scleral icterus.    Conjunctiva/sclera: Conjunctivae normal.  Neck:     Thyroid: No thyromegaly.  Cardiovascular:     Rate and  Rhythm: Normal rate and regular rhythm.     Heart sounds: Normal heart sounds. No murmur.  Pulmonary:     Effort: Pulmonary effort is normal. No respiratory distress.     Breath sounds: Normal breath sounds. No wheezing.  Musculoskeletal:        General: Normal range of motion.     Cervical back: Neck supple.  Lymphadenopathy:     Cervical: No cervical adenopathy.  Skin:    General: Skin is warm and dry.     Findings: No rash.  Neurological:     Mental Status: He is alert and oriented to person, place, and time.     Coordination: Coordination normal.  Psychiatric:        Behavior: Behavior normal.       Assessment & Plan:   Problem List Items Addressed This Visit      Cardiovascular and Mediastinum   Hypertension - Primary   Relevant Orders   CMP14+EGFR     Digestive   Gastroesophageal reflux disease with esophagitis   Relevant Orders   CBC with Differential/Platelet     Genitourinary   Chronic kidney disease (CKD), stage III (moderate)   Relevant Orders   CBC with Differential/Platelet     Other   Hyperlipidemia LDL goal <100   Relevant Orders   Lipid panel   Hypersomnia    Other Visit Diagnoses    Other fatigue       Relevant Orders   CBC with Differential/Platelet   TSH    Continue current medication, likelihood is clonidine and doxazosin could cause his fatigue but he has been on so many other agents that have not controlled his blood pressure that this is where we are conclusively having to be.  Patient does have renal artery stenosis in his history.  He does see urology for this as well.  Follow up plan: Return in about 3 months (around 02/12/2020), or if symptoms worsen or fail to improve, for Hypertension and cholesterol recheck.  Counseling provided for all of the vaccine components Orders Placed This Encounter  Procedures  . CBC with Differential/Platelet  . CMP14+EGFR  . Lipid panel  . TSH    Caryl Pina, MD [facility] 11/13/2019, 10:11 AM

## 2019-11-14 LAB — CMP14+EGFR
ALT: 10 IU/L (ref 0–44)
AST: 20 IU/L (ref 0–40)
Albumin/Globulin Ratio: 1.7 (ref 1.2–2.2)
Albumin: 4.3 g/dL (ref 3.7–4.7)
Alkaline Phosphatase: 157 IU/L — ABNORMAL HIGH (ref 39–117)
BUN/Creatinine Ratio: 10 (ref 10–24)
BUN: 20 mg/dL (ref 8–27)
Bilirubin Total: 0.7 mg/dL (ref 0.0–1.2)
CO2: 28 mmol/L (ref 20–29)
Calcium: 9.2 mg/dL (ref 8.6–10.2)
Chloride: 98 mmol/L (ref 96–106)
Creatinine, Ser: 2.07 mg/dL — ABNORMAL HIGH (ref 0.76–1.27)
GFR calc Af Amer: 35 mL/min/{1.73_m2} — ABNORMAL LOW (ref >59)
GFR calc non Af Amer: 31 mL/min/{1.73_m2} — ABNORMAL LOW (ref >59)
Globulin, Total: 2.6 g/dL (ref 1.5–4.5)
Glucose: 114 mg/dL — ABNORMAL HIGH (ref 65–99)
Potassium: 3.3 mmol/L — ABNORMAL LOW (ref 3.5–5.2)
Sodium: 139 mmol/L (ref 134–144)
Total Protein: 6.9 g/dL (ref 6.0–8.5)

## 2019-11-14 LAB — LIPID PANEL
Chol/HDL Ratio: 2.1 ratio (ref 0.0–5.0)
Cholesterol, Total: 144 mg/dL (ref 100–199)
HDL: 69 mg/dL (ref >39)
LDL Chol Calc (NIH): 55 mg/dL (ref 0–99)
Triglycerides: 110 mg/dL (ref 0–149)
VLDL Cholesterol Cal: 20 mg/dL (ref 5–40)

## 2019-11-14 LAB — CBC WITH DIFFERENTIAL/PLATELET
Basophils Absolute: 0.1 10*3/uL (ref 0.0–0.2)
Basos: 1 %
EOS (ABSOLUTE): 0.1 10*3/uL (ref 0.0–0.4)
Eos: 2 %
Hematocrit: 33.1 % — ABNORMAL LOW (ref 37.5–51.0)
Hemoglobin: 10.9 g/dL — ABNORMAL LOW (ref 13.0–17.7)
Immature Grans (Abs): 0 10*3/uL (ref 0.0–0.1)
Immature Granulocytes: 0 %
Lymphocytes Absolute: 1 10*3/uL (ref 0.7–3.1)
Lymphs: 13 %
MCH: 27.3 pg (ref 26.6–33.0)
MCHC: 32.9 g/dL (ref 31.5–35.7)
MCV: 83 fL (ref 79–97)
Monocytes Absolute: 0.5 10*3/uL (ref 0.1–0.9)
Monocytes: 7 %
Neutrophils Absolute: 5.9 10*3/uL (ref 1.4–7.0)
Neutrophils: 77 %
Platelets: 303 10*3/uL (ref 150–450)
RBC: 3.99 x10E6/uL — ABNORMAL LOW (ref 4.14–5.80)
RDW: 13 % (ref 11.6–15.4)
WBC: 7.7 10*3/uL (ref 3.4–10.8)

## 2019-11-14 LAB — TSH: TSH: 1.01 u[IU]/mL (ref 0.450–4.500)

## 2019-11-21 ENCOUNTER — Telehealth: Payer: Self-pay | Admitting: Family Medicine

## 2019-11-21 NOTE — Telephone Encounter (Signed)
Aware of results. 

## 2020-01-10 ENCOUNTER — Ambulatory Visit: Payer: Medicare Other | Admitting: Physician Assistant

## 2020-01-10 DIAGNOSIS — R58 Hemorrhage, not elsewhere classified: Secondary | ICD-10-CM | POA: Diagnosis not present

## 2020-01-10 DIAGNOSIS — S0031XA Abrasion of nose, initial encounter: Secondary | ICD-10-CM | POA: Diagnosis not present

## 2020-01-30 DIAGNOSIS — N401 Enlarged prostate with lower urinary tract symptoms: Secondary | ICD-10-CM | POA: Diagnosis not present

## 2020-02-13 ENCOUNTER — Other Ambulatory Visit: Payer: Self-pay

## 2020-02-13 ENCOUNTER — Encounter: Payer: Self-pay | Admitting: Family Medicine

## 2020-02-13 ENCOUNTER — Ambulatory Visit (INDEPENDENT_AMBULATORY_CARE_PROVIDER_SITE_OTHER): Payer: Medicare Other | Admitting: Family Medicine

## 2020-02-13 VITALS — BP 152/60 | HR 61 | Temp 97.2°F | Ht 68.0 in | Wt 174.0 lb

## 2020-02-13 DIAGNOSIS — N1832 Chronic kidney disease, stage 3b: Secondary | ICD-10-CM

## 2020-02-13 DIAGNOSIS — E785 Hyperlipidemia, unspecified: Secondary | ICD-10-CM | POA: Diagnosis not present

## 2020-02-13 DIAGNOSIS — K2100 Gastro-esophageal reflux disease with esophagitis, without bleeding: Secondary | ICD-10-CM | POA: Diagnosis not present

## 2020-02-13 DIAGNOSIS — I10 Essential (primary) hypertension: Secondary | ICD-10-CM | POA: Diagnosis not present

## 2020-02-13 DIAGNOSIS — M109 Gout, unspecified: Secondary | ICD-10-CM | POA: Diagnosis not present

## 2020-02-13 MED ORDER — ALLOPURINOL 100 MG PO TABS: 200.0000 mg | ORAL_TABLET | Freq: Every day | ORAL | 3 refills | Status: DC

## 2020-02-13 NOTE — Progress Notes (Signed)
BP (!) 152/60   Pulse 61   Temp (!) 97.2 F (36.2 C)   Ht 5' 8"  (1.727 m)   Wt 174 lb (78.9 kg)   SpO2 98%   BMI 26.46 kg/m    Subjective:   Patient ID: James [name], male    DOB: [birth year], 74 y.o.   MRN: [id number]  HPI: James [name] is a 74 y.o. male presenting on 02/13/2020 for Medical Management of Chronic Issues, Chronic Kidney Disease, Hypertension, and Hyperlipidemia   HPI Hypertension Patient is currently on spironolactone and doxazosin and furosemide and hydralazine and amlodipine, and their blood pressure today is 152/60. Patient denies any lightheadedness or dizziness. Patient denies headaches, blurred vision, chest pains, shortness of breath, or weakness. Denies any side effects from medication and is content with current medication.  Patient also has CKD stage III with history of renal artery stenosis.  GERD Patient is currently on no medication currently.  She denies any major symptoms or abdominal pain or belching or burping. She denies any blood in her stool or lightheadedness or dizziness.   Hyperlipidemia Patient is coming in for recheck of his hyperlipidemia. The patient is currently taking atorvastatin. They deny any issues with myalgias or history of liver damage from it. They deny any focal numbness or weakness or chest pain.   Gout Last attack: He does not have 1 recently at least within the past few months that he recalls. Attacks this year: A couple Medication: Allopurinol Location of attacks: Left foot  Relevant past medical, surgical, family and social history reviewed and updated as indicated. Interim medical history since our last visit reviewed. Allergies and medications reviewed and updated.  Review of Systems  Constitutional: Negative for chills and fever.  Eyes: Negative for visual disturbance.  Respiratory: Negative for shortness of breath and wheezing.   Cardiovascular: Positive for leg swelling. Negative for chest pain and  palpitations.  Musculoskeletal: Negative for back pain and gait problem.  Skin: Negative for rash.  Neurological: Negative for dizziness, weakness and light-headedness.  All other systems reviewed and are negative.   Per HPI unless specifically indicated above   Allergies as of 02/13/2020      Reactions   Plavix [clopidogrel Bisulfate] Other (See Comments)   GI Bleed   Uloric [febuxostat]       Medication List       Accurate as of February 13, 2020 10:58 AM. If you have any questions, ask your nurse or doctor.        allopurinol 100 MG tablet Commonly known as: ZYLOPRIM TAKE 2 TABLETS DAILY   amLODipine-atorvastatin 10-40 MG tablet Commonly known as: CADUET Take 1 tablet by mouth daily.   aspirin 81 MG tablet Take 81 mg by mouth 2 (two) times daily with a meal.   cloNIDine 0.2 MG tablet Commonly known as: CATAPRES Take 0.2 mg by mouth 2 (two) times daily.   doxazosin 2 MG tablet Commonly known as: CARDURA Take 2 mg by mouth daily.   furosemide 40 MG tablet Commonly known as: LASIX Take 65 mg by mouth daily.   hydrALAZINE 100 MG tablet Commonly known as: APRESOLINE Take 1 tablet by mouth 3 (three) times daily.   Vitamin D3 50 MCG (2000 UT) Tabs Take by mouth.        Objective:   There were no vitals taken for this visit.  Wt Readings from Last 3 Encounters:  11/13/19 177 lb (80.3 kg)  06/13/19 178 lb (80.7 kg)  06/12/19 176 lb (79.8 kg)    Physical Exam Vitals and nursing note reviewed.  Constitutional:      General: He is not in acute distress.    Appearance: He is well-developed. He is not diaphoretic.  Eyes:     General: No scleral icterus.    Conjunctiva/sclera: Conjunctivae normal.  Neck:     Thyroid: No thyromegaly.  Cardiovascular:     Rate and Rhythm: Normal rate and regular rhythm.     Heart sounds: Normal heart sounds. No murmur heard.   Pulmonary:     Effort: Pulmonary effort is normal. No respiratory distress.     Breath sounds:  Normal breath sounds. No wheezing.  Musculoskeletal:        General: Swelling (2+) present. Normal range of motion.     Cervical back: Neck supple.  Lymphadenopathy:     Cervical: No cervical adenopathy.  Skin:    General: Skin is warm and dry.     Findings: No rash.  Neurological:     Mental Status: He is alert and oriented to person, place, and time.     Coordination: Coordination normal.  Psychiatric:        Behavior: Behavior normal.     Assessment & Plan:   Problem List Items Addressed This Visit      Cardiovascular and Mediastinum   Hypertension - Primary   Relevant Medications   spironolactone (ALDACTONE) 25 MG tablet   Other Relevant Orders   CBC with Differential/Platelet   CMP14+EGFR     Digestive   Gastroesophageal reflux disease with esophagitis   Relevant Orders   TSH     Genitourinary   Chronic kidney disease (CKD), stage III (moderate)   Relevant Orders   CBC with Differential/Platelet   CMP14+EGFR   TSH     Other   Hyperlipidemia LDL goal <100   Relevant Medications   spironolactone (ALDACTONE) 25 MG tablet   Other Relevant Orders   CBC with Differential/Platelet   CMP14+EGFR   TSH   Gout   Relevant Orders   Uric acid      Continue spironolactone, continue follow-up with nephrology. Follow up plan: Return in about 6 months (around 08/15/2020), or if symptoms worsen or fail to improve, for Hypertension and cholesterol and CKD and gout..  Counseling provided for all of the vaccine components No orders of the defined types were placed in this encounter.   Caryl Pina, MD [facility] 02/13/2020, 10:58 AM

## 2020-02-14 LAB — CBC WITH DIFFERENTIAL/PLATELET
Basophils Absolute: 0.1 10*3/uL (ref 0.0–0.2)
Basos: 1 %
EOS (ABSOLUTE): 0.1 10*3/uL (ref 0.0–0.4)
Eos: 1 %
Hematocrit: 35.6 % — ABNORMAL LOW (ref 37.5–51.0)
Hemoglobin: 11.6 g/dL — ABNORMAL LOW (ref 13.0–17.7)
Immature Grans (Abs): 0 10*3/uL (ref 0.0–0.1)
Immature Granulocytes: 0 %
Lymphocytes Absolute: 1 10*3/uL (ref 0.7–3.1)
Lymphs: 11 %
MCH: 26.4 pg — ABNORMAL LOW (ref 26.6–33.0)
MCHC: 32.6 g/dL (ref 31.5–35.7)
MCV: 81 fL (ref 79–97)
Monocytes Absolute: 0.7 10*3/uL (ref 0.1–0.9)
Monocytes: 8 %
Neutrophils Absolute: 7.2 10*3/uL — ABNORMAL HIGH (ref 1.4–7.0)
Neutrophils: 79 %
Platelets: 293 10*3/uL (ref 150–450)
RBC: 4.39 x10E6/uL (ref 4.14–5.80)
RDW: 17 % — ABNORMAL HIGH (ref 11.6–15.4)
WBC: 9 10*3/uL (ref 3.4–10.8)

## 2020-02-14 LAB — CMP14+EGFR
ALT: 13 IU/L (ref 0–44)
AST: 26 IU/L (ref 0–40)
Albumin/Globulin Ratio: 1.5 (ref 1.2–2.2)
Albumin: 4.1 g/dL (ref 3.7–4.7)
Alkaline Phosphatase: 162 IU/L — ABNORMAL HIGH (ref 48–121)
BUN/Creatinine Ratio: 10 (ref 10–24)
BUN: 24 mg/dL (ref 8–27)
Bilirubin Total: 0.5 mg/dL (ref 0.0–1.2)
CO2: 25 mmol/L (ref 20–29)
Calcium: 9.3 mg/dL (ref 8.6–10.2)
Chloride: 95 mmol/L — ABNORMAL LOW (ref 96–106)
Creatinine, Ser: 2.35 mg/dL — ABNORMAL HIGH (ref 0.76–1.27)
GFR calc Af Amer: 30 mL/min/{1.73_m2} — ABNORMAL LOW (ref >59)
GFR calc non Af Amer: 26 mL/min/{1.73_m2} — ABNORMAL LOW (ref >59)
Globulin, Total: 2.7 g/dL (ref 1.5–4.5)
Glucose: 98 mg/dL (ref 65–99)
Potassium: 4.4 mmol/L (ref 3.5–5.2)
Sodium: 136 mmol/L (ref 134–144)
Total Protein: 6.8 g/dL (ref 6.0–8.5)

## 2020-02-14 LAB — URIC ACID: Uric Acid: 7.9 mg/dL (ref 3.8–8.4)

## 2020-02-14 LAB — TSH: TSH: 1.38 u[IU]/mL (ref 0.450–4.500)

## 2020-02-21 DIAGNOSIS — N184 Chronic kidney disease, stage 4 (severe): Secondary | ICD-10-CM | POA: Diagnosis not present

## 2020-02-21 DIAGNOSIS — I129 Hypertensive chronic kidney disease with stage 1 through stage 4 chronic kidney disease, or unspecified chronic kidney disease: Secondary | ICD-10-CM | POA: Diagnosis not present

## 2020-02-22 ENCOUNTER — Telehealth: Payer: Self-pay | Admitting: Family Medicine

## 2020-02-22 NOTE — Telephone Encounter (Signed)
Pt advised his allopurinol says to take 2 tablets by mouth daily so he can take his allopurinol how he has been taking it. He also had a question regarding his amlodipine/atorvastatin because he saw his nephrologist yesterday and he says on the handout he was given they had discontinued the amlodipine/atorvastatin. I advised pt to contact them to ask them if they wanted him to stop due to his kidney function etc and to let us know.

## 2020-02-26 NOTE — Telephone Encounter (Signed)
Yes that is fine to go ahead and keep taking it the way that he usually takes it.

## 2020-03-12 ENCOUNTER — Other Ambulatory Visit: Payer: Self-pay

## 2020-03-12 ENCOUNTER — Encounter: Payer: Self-pay | Admitting: Pulmonary Disease

## 2020-03-12 ENCOUNTER — Ambulatory Visit (INDEPENDENT_AMBULATORY_CARE_PROVIDER_SITE_OTHER): Payer: Medicare Other | Admitting: Pulmonary Disease

## 2020-03-12 VITALS — BP 142/62 | HR 81 | Temp 97.5°F | Ht 70.0 in | Wt 175.2 lb

## 2020-03-12 DIAGNOSIS — J3089 Other allergic rhinitis: Secondary | ICD-10-CM | POA: Diagnosis not present

## 2020-03-12 DIAGNOSIS — Z9989 Dependence on other enabling machines and devices: Secondary | ICD-10-CM

## 2020-03-12 DIAGNOSIS — G4733 Obstructive sleep apnea (adult) (pediatric): Secondary | ICD-10-CM

## 2020-03-12 MED ORDER — FLUTICASONE PROPIONATE 50 MCG/ACT NA SUSP
1.0000 | Freq: Every day | NASAL | 2 refills | Status: DC
Start: 2020-03-12 — End: 2022-08-30

## 2020-03-12 NOTE — Progress Notes (Signed)
[facility]  Chief Complaint  Patient presents with  . Follow-up    nasal drip at times,    Constitutional:  BP (!) 142/62 (BP Location: Left Arm, Cuff Size: Normal)   Pulse 81   Temp (!) 97.5 F (36.4 C) (Other (Comment)) Comment (Src): Wrist  Ht 5\' 10"  (1.778 m)   Wt 175 lb 3.2 oz (79.5 kg)   SpO2 97% Comment: Room air  BMI 25.14 kg/m   Past Medical History:  HTN, HLD, PUD, Colon polyp, Renal artery stenosis s/p stent, Gout, GERD, DVT 2008, OA, PAD s/p CEA  Brief Summary:  James [name] is a 74 y.o. male former smoker with obstructive sleep apnea.  Subjective:  He hasn't been able to use CPAP.  Has been getting more sinus congestion and drainage.  This is worse at night.  Also worse when he is inside more especially during the winter.  He has carpeting in his bedroom.  Physical Exam:   Appearance - well kempt   ENMT - no sinus tenderness, deviated nasal septum, no oral exudate, no LAN, Mallampati 3 airway, no stridor  Respiratory - equal breath sounds bilaterally, no wheezing or rales  CV - s1s2 regular rate and rhythm, no murmurs  Ext - no clubbing, no edema  Skin - no rashes  Psych - normal mood and affect   Assessment/Plan:   Allergic rhinitis. - likely from dust mites - he will try nasal irrigation and flonase - discussed mattress/pillow covers and washing his sheets in hot water  Obstructive sleep apnea. - advised him to resume CPAP once his sinus are better - he will continue auto CPAP - discussed potential concerns with using ozone based CPAP cleaners; he has been using a SoClean device  A total of  23 minutes spent addressing patient care issues on day of visit.   Follow up:   Patient Instructions  Try using nasal irrigation (sinus salt water spray) nightly for two weeks, then as needed to help clear sinus congestion Flonase 1 spray in each nostril nightly for two weeks, then as needed to help  decrease sinus congestion  You can get covers for your pillow and mattress to reduce exposure to dust mites  Wash your bed sheets weekly in hot water  Follow up in 1 year   Signature:  Chesley Mires, MD [place] Pager: [phone] 03/12/2020, 12:10 PM  Flow Sheet    Sleep tests:   HST 01/14/16 >> AHI 25.9, SaO2 low 77%  Auto CPAP 05/13/19 to 06/11/19 >> used on 30 of 30 nights with average 9 hrs 13 min.  Average AHI 7.8 with median CPAP 8 and 95 th percentile CPAP 13 cm H2O.  Some air leak.  Medications:   Allergies as of 03/12/2020      Reactions   Plavix [clopidogrel Bisulfate] Other (See Comments)   GI Bleed   Uloric [febuxostat]       Medication List       Accurate as of March 12, 2020 12:10 PM. If you have any questions, ask your nurse or doctor.        allopurinol 100 MG tablet Commonly known as: ZYLOPRIM Take 2 tablets (200 mg total) by mouth daily.   amLODipine-atorvastatin 10-40 MG tablet Commonly known as: CADUET Take 1 tablet by mouth daily.   aspirin 81 MG tablet Take 81 mg by mouth 2 (two) times daily with a meal.   cloNIDine 0.2 MG tablet Commonly  known as: CATAPRES Take 0.2 mg by mouth 2 (two) times daily.   doxazosin 2 MG tablet Commonly known as: CARDURA Take 2 mg by mouth daily.   ferrous sulfate 325 (65 FE) MG tablet Take 325 mg by mouth in the morning and at bedtime.   fluticasone 50 MCG/ACT nasal spray Commonly known as: FLONASE Place 1 spray into both nostrils daily. Started by: Chesley Mires, MD   furosemide 40 MG tablet Commonly known as: LASIX Take 65 mg by mouth daily.   hydrALAZINE 100 MG tablet Commonly known as: APRESOLINE Take 1 tablet by mouth 3 (three) times daily.   potassium chloride 10 MEQ CR capsule Commonly known as: MICRO-K Take 25 capsules by mouth daily.   spironolactone 25 MG tablet Commonly known as: ALDACTONE Take 25 mg by mouth daily.   Vitamin D3 50 MCG (2000 UT)  Tabs Take by mouth.       Past Surgical History:  He  has a past surgical history that includes Renal artery stent (2009); Carotid endarterectomy (2008); small intestine removed (2001); Esophagogastroduodenoscopy; Cholecystectomy (mid 90's); Colonoscopy; Hernia repair; Endarterectomy (06/02/2012); Patch angioplasty (06/02/2012); Colon surgery; and Cardiac catheterization (Right, 12/03/2015).  Family History:  His family history includes Clotting disorder in his father; Deep vein thrombosis in his father; Diabetes in his father and sister; Hyperlipidemia in his brother, father, mother, and sister; Hypertension in his brother, father, mother, and sister; Other in his mother and sister; Varicose Veins in his sister.  Social History:  He  reports that he quit smoking about 20 years ago. His smoking use included cigarettes. He has a 54.00 pack-year smoking history. He quit smokeless tobacco use about 20 years ago.  His smokeless tobacco use included chew. He reports that he does not drink alcohol and does not use drugs.

## 2020-03-12 NOTE — Patient Instructions (Signed)
Try using nasal irrigation (sinus salt water spray) nightly for two weeks, then as needed to help clear sinus congestion Flonase 1 spray in each nostril nightly for two weeks, then as needed to help decrease sinus congestion  You can get covers for your pillow and mattress to reduce exposure to dust mites  Wash your bed sheets weekly in hot water  Follow up in 1 year

## 2020-03-17 ENCOUNTER — Other Ambulatory Visit: Payer: Self-pay | Admitting: Family Medicine

## 2020-03-19 DIAGNOSIS — H2513 Age-related nuclear cataract, bilateral: Secondary | ICD-10-CM | POA: Diagnosis not present

## 2020-03-19 DIAGNOSIS — H52203 Unspecified astigmatism, bilateral: Secondary | ICD-10-CM | POA: Diagnosis not present

## 2020-05-08 DIAGNOSIS — N184 Chronic kidney disease, stage 4 (severe): Secondary | ICD-10-CM | POA: Diagnosis not present

## 2020-05-15 DIAGNOSIS — N184 Chronic kidney disease, stage 4 (severe): Secondary | ICD-10-CM | POA: Diagnosis not present

## 2020-05-15 DIAGNOSIS — N2581 Secondary hyperparathyroidism of renal origin: Secondary | ICD-10-CM | POA: Diagnosis not present

## 2020-05-15 DIAGNOSIS — I129 Hypertensive chronic kidney disease with stage 1 through stage 4 chronic kidney disease, or unspecified chronic kidney disease: Secondary | ICD-10-CM | POA: Diagnosis not present

## 2020-05-15 DIAGNOSIS — R600 Localized edema: Secondary | ICD-10-CM | POA: Diagnosis not present

## 2020-05-15 DIAGNOSIS — E876 Hypokalemia: Secondary | ICD-10-CM | POA: Diagnosis not present

## 2020-05-15 DIAGNOSIS — I701 Atherosclerosis of renal artery: Secondary | ICD-10-CM | POA: Diagnosis not present

## 2020-05-15 DIAGNOSIS — D631 Anemia in chronic kidney disease: Secondary | ICD-10-CM | POA: Diagnosis not present

## 2020-05-16 ENCOUNTER — Other Ambulatory Visit: Payer: Self-pay | Admitting: Nephrology

## 2020-05-16 DIAGNOSIS — I701 Atherosclerosis of renal artery: Secondary | ICD-10-CM

## 2020-05-16 DIAGNOSIS — N184 Chronic kidney disease, stage 4 (severe): Secondary | ICD-10-CM

## 2020-05-23 DIAGNOSIS — E611 Iron deficiency: Secondary | ICD-10-CM | POA: Diagnosis not present

## 2020-05-26 ENCOUNTER — Ambulatory Visit
Admission: RE | Admit: 2020-05-26 | Discharge: 2020-05-26 | Disposition: A | Discharge status: Home / Self Care | Payer: Medicare Other | Source: Ambulatory Visit | Attending: Nephrology | Admitting: Nephrology

## 2020-05-26 ENCOUNTER — Other Ambulatory Visit: Payer: Self-pay

## 2020-05-26 DIAGNOSIS — N281 Cyst of kidney, acquired: Secondary | ICD-10-CM | POA: Diagnosis not present

## 2020-05-26 DIAGNOSIS — N2889 Other specified disorders of kidney and ureter: Secondary | ICD-10-CM | POA: Diagnosis not present

## 2020-05-26 DIAGNOSIS — N184 Chronic kidney disease, stage 4 (severe): Secondary | ICD-10-CM

## 2020-05-26 DIAGNOSIS — Z9049 Acquired absence of other specified parts of digestive tract: Secondary | ICD-10-CM | POA: Diagnosis not present

## 2020-05-26 DIAGNOSIS — I701 Atherosclerosis of renal artery: Secondary | ICD-10-CM

## 2020-06-06 ENCOUNTER — Ambulatory Visit (INDEPENDENT_AMBULATORY_CARE_PROVIDER_SITE_OTHER): Payer: Medicare Other | Admitting: *Deleted

## 2020-06-06 ENCOUNTER — Other Ambulatory Visit: Payer: Self-pay

## 2020-06-06 DIAGNOSIS — Z23 Encounter for immunization: Secondary | ICD-10-CM | POA: Diagnosis not present

## 2020-06-12 ENCOUNTER — Other Ambulatory Visit: Payer: Self-pay

## 2020-06-12 DIAGNOSIS — I701 Atherosclerosis of renal artery: Secondary | ICD-10-CM

## 2020-06-24 ENCOUNTER — Ambulatory Visit (INDEPENDENT_AMBULATORY_CARE_PROVIDER_SITE_OTHER): Payer: Medicare Other | Admitting: Surgery

## 2020-06-24 ENCOUNTER — Encounter: Payer: Self-pay | Admitting: Surgery

## 2020-06-24 ENCOUNTER — Other Ambulatory Visit: Payer: Self-pay

## 2020-06-24 ENCOUNTER — Ambulatory Visit (HOSPITAL_COMMUNITY)
Admission: RE | Admit: 2020-06-24 | Discharge: 2020-06-24 | Disposition: A | Discharge status: Home / Self Care | Payer: Medicare Other | Source: Ambulatory Visit | Attending: Surgery | Admitting: Surgery

## 2020-06-24 VITALS — BP 193/79 | HR 82 | Temp 98.7°F | Resp 20 | Ht 70.0 in | Wt 176.0 lb

## 2020-06-24 DIAGNOSIS — I6523 Occlusion and stenosis of bilateral carotid arteries: Secondary | ICD-10-CM | POA: Diagnosis not present

## 2020-06-24 DIAGNOSIS — I701 Atherosclerosis of renal artery: Secondary | ICD-10-CM

## 2020-06-24 NOTE — Progress Notes (Signed)
[facility]  Patient name: James [name] MRN: [id number] DOB: 1946-05-05 Sex: male   REASON FOR VISIT:    Follow up  [place]:    Lenox Bink Cocklereeceis a 74y.o.malereturns today for follow-up. He is status post left carotid endarterectomy for asymptomatic stenosis in November 2013. He is status post right carotid endarterectomy in 2008. In 2009 he underwent a right renal artery stenting secondary to elevated creatinine and blood pressure. His creatinine has remained stable. He does suffer from whitecoat hypertension. Last year he was identified to have a progressive stenosis within his right renal artery stent and on 12/03/2015 he underwent renal angiography. 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 The right kidney was perfused to an accessory vessel which did appear to opacify the main renal artery.   He has no complaints today.  He denies any neurologic issues such as numbness or weakness in either extremity, slurred speech, or amaurosis fugax.  He does have some swelling in his legs which are alleviated with compression socks.  PAST MEDICAL HISTORY:   Past Medical History:  Diagnosis Date  . Arthritis   . Carotid artery occlusion   . DVT (deep venous thrombosis) ([place]) 2008  . DVT (deep venous thrombosis) ([place])   . GERD (gastroesophageal reflux disease)    takes Protonix daily  . Gout    takes Allopurinol daily and Indomethacin prn  . Gout flare   . H/O renal artery stenosis   . H/O: GI bleed   . History of blood transfusion   . History of colon polyps   . History of gastric ulcer   . Hyperlipidemia    takes Lipitor daily  . Hypertension    takes Metoprolol and Amlodipine daily  . Joint pain   . Joint swelling      FAMILY HISTORY:   Family History  Problem Relation Age of Onset  . Hypertension Mother   . Hyperlipidemia Mother   . Other  Mother        varicose veins  . Hyperlipidemia Father   . Hypertension Father   . Diabetes Father   . Clotting disorder Father   . Deep vein thrombosis Father   . Hypertension Sister   . Diabetes Sister   . Hyperlipidemia Sister   . Varicose Veins Sister   . Other Sister        Bleeding problems  . Hypertension Brother   . Hyperlipidemia Brother     SOCIAL HISTORY:   Social History   Tobacco Use  . Smoking status: Former Smoker    Packs/day: 1.50    Years: 36.00    Pack years: 54.00    Types: Cigarettes    Quit date: 12/26/1999    Years since quitting: 20.5  . Smokeless tobacco: Former Systems developer    Types: [place] date: 12/26/1999  Substance Use Topics  . Alcohol use: No    Comment: Quit in 1998     ALLERGIES:   Allergies  Allergen Reactions  . Plavix [Clopidogrel Bisulfate] Other (See Comments)    GI Bleed  . Uloric [Febuxostat]      CURRENT MEDICATIONS:   Current Outpatient Medications  Medication Sig Dispense Refill  . allopurinol (ZYLOPRIM) 100 MG tablet Take 2 tablets (200 mg total) by mouth daily. 180 tablet 3  . amLODipine-atorvastatin (CADUET) 10-40 MG tablet TAKE 1 TABLET DAILY 90 tablet 1  . aspirin 81 MG tablet Take  81 mg by mouth 2 (two) times daily with a meal.     . Cholecalciferol (VITAMIN D3) 50 MCG (2000 UT) TABS Take by mouth.    . cloNIDine (CATAPRES) 0.2 MG tablet Take 0.2 mg by mouth 2 (two) times daily.    Marland Kitchen doxazosin (CARDURA) 2 MG tablet Take 2 mg by mouth daily.    . ferrous sulfate 325 (65 FE) MG tablet Take 325 mg by mouth in the morning and at bedtime.    . fluticasone (FLONASE) 50 MCG/ACT nasal spray Place 1 spray into both nostrils daily. 16 g 2  . furosemide (LASIX) 40 MG tablet Take 65 mg by mouth daily.     . hydrALAZINE (APRESOLINE) 100 MG tablet Take 1 tablet by mouth 3 (three) times daily.    . potassium chloride (MICRO-K) 10 MEQ CR capsule Take 25 capsules by mouth daily.    Marland Kitchen spironolactone (ALDACTONE) 25 MG tablet Take  25 mg by mouth daily.     No current facility-administered medications for this visit.    REVIEW OF SYSTEMS:   [X]  denotes positive finding, [ ]  denotes negative finding Cardiac  Comments:  Chest pain or chest pressure:    Shortness of breath upon exertion:    Short of breath when lying flat:    Irregular heart rhythm:        Vascular    Pain in calf, thigh, or hip brought on by ambulation:    Pain in feet at night that wakes you up from your sleep:     Blood clot in your veins:    Leg swelling:  x       Pulmonary    Oxygen at home:    Productive cough:     Wheezing:         Neurologic    Sudden weakness in arms or legs:     Sudden numbness in arms or legs:     Sudden onset of difficulty speaking or slurred speech:    Temporary loss of vision in one eye:     Problems with dizziness:         Gastrointestinal    Blood in stool:     Vomited blood:         Genitourinary    Burning when urinating:     Blood in urine:        Psychiatric    Major depression:         Hematologic    Bleeding problems:    Problems with blood clotting too easily:        Skin    Rashes or ulcers:        Constitutional    Fever or chills:      PHYSICAL EXAM:   Vitals:   06/24/20 0904  BP: (!) 193/79  Pulse: 82  Resp: 20  Temp: 98.7 F (37.1 C)  SpO2: 96%  Weight: 176 lb (79.8 kg)  Height: 5\' 10"  (1.778 m)    GENERAL: The patient is a well-nourished male, in no acute distress. The vital signs are documented above. CARDIAC: There is a regular rate and rhythm.  PULMONARY: Non-labored respirations ABDOMEN: Soft and non-tender .  MUSCULOSKELETAL: There are no major deformities or cyanosis. NEUROLOGIC: No focal weakness or paresthesias are detected. SKIN: There are no ulcers or rashes noted. PSYCHIATRIC: The patient has a normal affect.  STUDIES:   I have reviewed the following:  Renal duplex: Right: Normal size right kidney. Known right renal  artery origin      stent occlusion. No other renal artery stenosis seen. Renal     cyst present.  Left: Normal size of left kidney. Renal cyst present.   MEDICAL ISSUES:   Renal: He has normal renal size on the right despite a known occlusion of the right renal artery stent.  His renal artery is filled by a large accessory branch.  No further intervention is recommended.  I will not repeat his ultrasound  Carotid: Duplex a year ago showed no significant stenosis.  This was not repeated this year so I will repeat it again in 1 year.    Leia Alf, MD, FACS [facility] [phone] Pager [phone]

## 2020-06-25 DIAGNOSIS — I872 Venous insufficiency (chronic) (peripheral): Secondary | ICD-10-CM | POA: Diagnosis not present

## 2020-06-25 DIAGNOSIS — D225 Melanocytic nevi of trunk: Secondary | ICD-10-CM | POA: Diagnosis not present

## 2020-06-25 DIAGNOSIS — L821 Other seborrheic keratosis: Secondary | ICD-10-CM | POA: Diagnosis not present

## 2020-06-25 DIAGNOSIS — I8312 Varicose veins of left lower extremity with inflammation: Secondary | ICD-10-CM | POA: Diagnosis not present

## 2020-06-25 DIAGNOSIS — D692 Other nonthrombocytopenic purpura: Secondary | ICD-10-CM | POA: Diagnosis not present

## 2020-06-25 DIAGNOSIS — I8311 Varicose veins of right lower extremity with inflammation: Secondary | ICD-10-CM | POA: Diagnosis not present

## 2020-06-25 DIAGNOSIS — L718 Other rosacea: Secondary | ICD-10-CM | POA: Diagnosis not present

## 2020-07-02 DIAGNOSIS — N184 Chronic kidney disease, stage 4 (severe): Secondary | ICD-10-CM | POA: Diagnosis not present

## 2020-07-08 DIAGNOSIS — N184 Chronic kidney disease, stage 4 (severe): Secondary | ICD-10-CM | POA: Diagnosis not present

## 2020-07-08 DIAGNOSIS — I701 Atherosclerosis of renal artery: Secondary | ICD-10-CM | POA: Diagnosis not present

## 2020-07-08 DIAGNOSIS — R600 Localized edema: Secondary | ICD-10-CM | POA: Diagnosis not present

## 2020-07-08 DIAGNOSIS — N281 Cyst of kidney, acquired: Secondary | ICD-10-CM | POA: Diagnosis not present

## 2020-07-08 DIAGNOSIS — D631 Anemia in chronic kidney disease: Secondary | ICD-10-CM | POA: Diagnosis not present

## 2020-07-08 DIAGNOSIS — E876 Hypokalemia: Secondary | ICD-10-CM | POA: Diagnosis not present

## 2020-07-08 DIAGNOSIS — N2581 Secondary hyperparathyroidism of renal origin: Secondary | ICD-10-CM | POA: Diagnosis not present

## 2020-07-08 DIAGNOSIS — N62 Hypertrophy of breast: Secondary | ICD-10-CM | POA: Diagnosis not present

## 2020-07-08 DIAGNOSIS — I129 Hypertensive chronic kidney disease with stage 1 through stage 4 chronic kidney disease, or unspecified chronic kidney disease: Secondary | ICD-10-CM | POA: Diagnosis not present

## 2020-07-11 DIAGNOSIS — N401 Enlarged prostate with lower urinary tract symptoms: Secondary | ICD-10-CM | POA: Diagnosis not present

## 2020-07-11 DIAGNOSIS — R3915 Urgency of urination: Secondary | ICD-10-CM | POA: Diagnosis not present

## 2020-07-11 DIAGNOSIS — N281 Cyst of kidney, acquired: Secondary | ICD-10-CM | POA: Diagnosis not present

## 2020-08-15 ENCOUNTER — Other Ambulatory Visit: Payer: Self-pay

## 2020-08-15 ENCOUNTER — Telehealth: Payer: Self-pay

## 2020-08-15 ENCOUNTER — Ambulatory Visit (INDEPENDENT_AMBULATORY_CARE_PROVIDER_SITE_OTHER): Payer: Medicare Other | Admitting: Family Medicine

## 2020-08-15 ENCOUNTER — Encounter: Payer: Self-pay | Admitting: Family Medicine

## 2020-08-15 VITALS — BP 173/61 | HR 83 | Ht 70.0 in | Wt 180.0 lb

## 2020-08-15 DIAGNOSIS — K2100 Gastro-esophageal reflux disease with esophagitis, without bleeding: Secondary | ICD-10-CM

## 2020-08-15 DIAGNOSIS — E785 Hyperlipidemia, unspecified: Secondary | ICD-10-CM | POA: Diagnosis not present

## 2020-08-15 DIAGNOSIS — N1832 Chronic kidney disease, stage 3b: Secondary | ICD-10-CM | POA: Diagnosis not present

## 2020-08-15 DIAGNOSIS — I159 Secondary hypertension, unspecified: Secondary | ICD-10-CM | POA: Diagnosis not present

## 2020-08-15 DIAGNOSIS — I10 Essential (primary) hypertension: Secondary | ICD-10-CM | POA: Diagnosis not present

## 2020-08-15 MED ORDER — AMLODIPINE-ATORVASTATIN 10-40 MG PO TABS
1.0000 | ORAL_TABLET | Freq: Every day | ORAL | 3 refills | Status: DC
Start: 2020-08-15 — End: 2020-09-10

## 2020-08-15 MED ORDER — AMLODIPINE-ATORVASTATIN 10-40 MG PO TABS
1.0000 | ORAL_TABLET | Freq: Every day | ORAL | 3 refills | Status: DC
Start: 2020-08-15 — End: 2020-08-15

## 2020-08-15 MED ORDER — ALLOPURINOL 100 MG PO TABS
200.0000 mg | ORAL_TABLET | Freq: Every day | ORAL | 3 refills | Status: DC
Start: 2020-08-15 — End: 2020-08-15

## 2020-08-15 MED ORDER — ALLOPURINOL 100 MG PO TABS
200.0000 mg | ORAL_TABLET | Freq: Every day | ORAL | 3 refills | Status: DC
Start: 2020-08-15 — End: 2020-10-11

## 2020-08-15 NOTE — Progress Notes (Signed)
BP (!) 173/61   Pulse 83   Ht 5' 10"  (1.778 m)   Wt 180 lb (81.6 kg)   SpO2 95%   BMI 25.83 kg/m    Subjective:   Patient ID: James [name], male    DOB: 1945/11/22, 75 y.o.   MRN: [id number]  HPI: James [name] is a 75 y.o. male presenting on 08/15/2020 for Medical Management of Chronic Issues, Hypertension, and Hyperlipidemia   HPI Hypertension Patient is currently on clonidine and doxazosin and hydralazine and amlodipine, and their blood pressure today is 173/61. Patient denies any lightheadedness or dizziness. Patient denies headaches, blurred vision, chest pains, shortness of breath, or weakness. Denies any side effects from medication and is content with current medication. 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  Hyperlipidemia Patient is coming in for recheck of his hyperlipidemia. The patient is currently taking atorvastatin. They deny any issues with myalgias or history of liver damage from it. They deny any focal numbness or weakness or chest pain.   GERD Patient is currently on no medicine currently and has been doing fine diet controlled for GERD.  She denies any major symptoms or abdominal pain or belching or burping. She denies any blood in her stool or lightheadedness or dizziness.   CKD stage III Patient sees nephrology for CKD and also sees urology because of her large stenosis.  We will recheck levels, they did go up last time.  Relevant past medical, surgical, family and social history reviewed and updated as indicated. Interim medical history since our last visit reviewed. Allergies and medications reviewed and updated.  Review of Systems  Constitutional: Negative for chills and fever.  Eyes: Negative for visual disturbance.  Respiratory: Negative for shortness of breath and wheezing.   Cardiovascular: Negative  for chest pain and leg swelling.  Musculoskeletal: Negative for back pain and gait problem.  Skin: Negative for rash.  Neurological: Negative for dizziness, weakness and light-headedness.  All other systems reviewed and are negative.   Per HPI unless specifically indicated above   Allergies as of 08/15/2020      Reactions   Plavix [clopidogrel Bisulfate] Other (See Comments)   GI Bleed   Uloric [febuxostat]       Medication List       Accurate as of August 15, 2020 10:42 AM. If you have any questions, ask your nurse or doctor.        STOP taking these medications   spironolactone 25 MG tablet Commonly known as: ALDACTONE Stopped by: [name] Jerlisa Diliberto, MD     TAKE these medications   allopurinol 100 MG tablet Commonly known as: ZYLOPRIM Take 2 tablets (200 mg total) by mouth daily.   amLODipine-atorvastatin 10-40 MG tablet Commonly known as: CADUET TAKE 1 TABLET DAILY   aspirin 81 MG tablet Take 81 mg by mouth 2 (two) times daily with a meal.   cloNIDine 0.2 MG tablet Commonly known as: CATAPRES Take 0.2 mg by mouth 2 (two) times daily.   doxazosin 2 MG tablet Commonly known as: CARDURA Take 2 mg by mouth daily.   ferrous sulfate 325 (65 FE) MG tablet Take 325 mg by mouth in the morning and at bedtime.   fluticasone 50 MCG/ACT nasal spray Commonly known as: FLONASE Place 1 spray into both nostrils daily.   furosemide 40 MG tablet Commonly known  as: LASIX Take 65 mg by mouth daily.   hydrALAZINE 100 MG tablet Commonly known as: APRESOLINE Take 1 tablet by mouth 3 (three) times daily.   potassium chloride 10 MEQ CR capsule Commonly known as: MICRO-K Take 25 capsules by mouth daily.   Vitamin D3 50 MCG (2000 UT) Tabs Take by mouth.        Objective:   BP (!) 173/61   Pulse 83   Ht 5' 10"  (1.778 m)   Wt 180 lb (81.6 kg)   SpO2 95%   BMI 25.83 kg/m   Wt Readings from Last 3 Encounters:  08/15/20 180 lb (81.6 kg)  06/24/20 176 lb (79.8  kg)  03/12/20 175 lb 3.2 oz (79.5 kg)    Physical Exam Vitals and nursing note reviewed.  Constitutional:      General: He is not in acute distress.    Appearance: He is well-developed and well-nourished. He is not diaphoretic.  Eyes:     General: No scleral icterus.    Extraocular Movements: EOM normal.     Conjunctiva/sclera: Conjunctivae normal.  Neck:     Thyroid: No thyromegaly.  Cardiovascular:     Rate and Rhythm: Normal rate and regular rhythm.     Pulses: Intact distal pulses.     Heart sounds: Normal heart sounds. No murmur heard.   Pulmonary:     Effort: Pulmonary effort is normal. No respiratory distress.     Breath sounds: Normal breath sounds. No wheezing.  Musculoskeletal:        General: No edema. Normal range of motion.     Cervical back: Neck supple.  Lymphadenopathy:     Cervical: No cervical adenopathy.  Skin:    General: Skin is warm and dry.     Findings: No rash.  Neurological:     Mental Status: He is alert and oriented to person, place, and time.     Coordination: Coordination normal.  Psychiatric:        Mood and Affect: Mood and affect normal.        Behavior: Behavior normal.       Assessment & Plan:   Problem List Items Addressed This Visit      Cardiovascular and Mediastinum   Hypertension - Primary   Relevant Medications   amLODipine-atorvastatin (CADUET) 10-40 MG tablet   Other Relevant Orders   CMP14+EGFR   Accelerated secondary hypertension   Relevant Medications   amLODipine-atorvastatin (CADUET) 10-40 MG tablet   Other Relevant Orders   CMP14+EGFR     Digestive   Gastroesophageal reflux disease with esophagitis   Relevant Orders   CBC with Differential/Platelet     Genitourinary   Chronic kidney disease (CKD), stage III (moderate) (HCC)     Other   Hyperlipidemia LDL goal <100   Relevant Medications   amLODipine-atorvastatin (CADUET) 10-40 MG tablet   Other Relevant Orders   CMP14+EGFR   Lipid panel       Continue current medication, really do not have anemia. Blood pressure. He continues to follow-up with a specialist. Follow up plan: Return in about 6 months (around 02/12/2021), or if symptoms worsen or fail to improve, for Hypertension and cholesterol and GERD.  Counseling provided for all of the vaccine components No orders of the defined types were placed in this encounter.   Caryl Pina, MD [facility] 08/15/2020, 10:42 AM

## 2020-08-15 NOTE — Telephone Encounter (Signed)
Medications sent to [facility] in [place].

## 2020-08-16 LAB — CMP14+EGFR
ALT: 14 IU/L (ref 0–44)
AST: 22 IU/L (ref 0–40)
Albumin/Globulin Ratio: 1.7 (ref 1.2–2.2)
Albumin: 4.5 g/dL (ref 3.7–4.7)
Alkaline Phosphatase: 172 IU/L — ABNORMAL HIGH (ref 44–121)
BUN/Creatinine Ratio: 9 — ABNORMAL LOW (ref 10–24)
BUN: 22 mg/dL (ref 8–27)
Bilirubin Total: 0.6 mg/dL (ref 0.0–1.2)
CO2: 27 mmol/L (ref 20–29)
Calcium: 9.3 mg/dL (ref 8.6–10.2)
Chloride: 97 mmol/L (ref 96–106)
Creatinine, Ser: 2.52 mg/dL — ABNORMAL HIGH (ref 0.76–1.27)
GFR calc Af Amer: 28 mL/min/{1.73_m2} — ABNORMAL LOW (ref >59)
GFR calc non Af Amer: 24 mL/min/{1.73_m2} — ABNORMAL LOW (ref >59)
Globulin, Total: 2.6 g/dL (ref 1.5–4.5)
Glucose: 108 mg/dL — ABNORMAL HIGH (ref 65–99)
Potassium: 3.4 mmol/L — ABNORMAL LOW (ref 3.5–5.2)
Sodium: 138 mmol/L (ref 134–144)
Total Protein: 7.1 g/dL (ref 6.0–8.5)

## 2020-08-16 LAB — CBC WITH DIFFERENTIAL/PLATELET
Basophils Absolute: 0.1 10*3/uL (ref 0.0–0.2)
Basos: 1 %
EOS (ABSOLUTE): 0.1 10*3/uL (ref 0.0–0.4)
Eos: 1 %
Hematocrit: 35.5 % — ABNORMAL LOW (ref 37.5–51.0)
Hemoglobin: 12.3 g/dL — ABNORMAL LOW (ref 13.0–17.7)
Immature Grans (Abs): 0 10*3/uL (ref 0.0–0.1)
Immature Granulocytes: 0 %
Lymphocytes Absolute: 0.8 10*3/uL (ref 0.7–3.1)
Lymphs: 9 %
MCH: 28.7 pg (ref 26.6–33.0)
MCHC: 34.6 g/dL (ref 31.5–35.7)
MCV: 83 fL (ref 79–97)
Monocytes Absolute: 0.5 10*3/uL (ref 0.1–0.9)
Monocytes: 6 %
Neutrophils Absolute: 7.2 10*3/uL — ABNORMAL HIGH (ref 1.4–7.0)
Neutrophils: 83 %
Platelets: 268 10*3/uL (ref 150–450)
RBC: 4.28 x10E6/uL (ref 4.14–5.80)
RDW: 13 % (ref 11.6–15.4)
WBC: 8.7 10*3/uL (ref 3.4–10.8)

## 2020-08-16 LAB — LIPID PANEL
Chol/HDL Ratio: 2.1 ratio (ref 0.0–5.0)
Cholesterol, Total: 171 mg/dL (ref 100–199)
HDL: 80 mg/dL (ref >39)
LDL Chol Calc (NIH): 69 mg/dL (ref 0–99)
Triglycerides: 131 mg/dL (ref 0–149)
VLDL Cholesterol Cal: 22 mg/dL (ref 5–40)

## 2020-09-10 ENCOUNTER — Other Ambulatory Visit: Payer: Self-pay | Admitting: Family Medicine

## 2020-09-17 ENCOUNTER — Encounter: Payer: Self-pay | Admitting: Cardiology

## 2020-09-17 NOTE — Progress Notes (Signed)
Cardiology Office Note   Date:  09/18/2020   ID:  James [name], DOB 12/14/1945, MRN [id number]  PCP:  [name], [name], MD  Cardiologist:   No primary care provider on file. Referring:  [name], [name], MD  Chief Complaint  Patient presents with  . Fatigue      History of Present Illness: James [name] is a 75 y.o. male who presents for evaluation of fatigue and difficult to control hypertension. He has peripheral vascular disease with carotid stenosis and also renal artery stenosis. His last angiogram in 2017 demonstrated an occluded right renal artery stent but he had flow through an accessory vessel. He does have renal insufficiency with a creatinine density at 2.5. He sees a nephrologist. He has had high blood pressure managed as below. He says at home it is in the 175Z to 025E systolic. He has sleep apnea and I reviewed notes from Dr. Halford Chessman. However, he does not use his CPAP as it aggravates him. He does have significant fatigue throughout the day and he does not want to do anything. He does not describe chest pressure, neck or arm discomfort. He does not describe palpitations, presyncope or syncope. He is not describing PND or orthopnea. He has never really had any cardiac issues despite his carotid and renal artery disease.   Past Medical History:  Diagnosis Date  . Arthritis   . Carotid artery occlusion   . DVT (deep venous thrombosis) ([place]) 2008  . GERD (gastroesophageal reflux disease)    takes Protonix daily  . Gout    takes Allopurinol daily and Indomethacin prn  . H/O renal artery stenosis   . H/O: GI bleed   . History of blood transfusion   . History of colon polyps   . History of gastric ulcer   . Hyperlipidemia    takes Lipitor daily  . Hypertension    takes Metoprolol and Amlodipine daily  . Joint pain   . Joint swelling     Past Surgical History:  Procedure Laterality Date  . CAROTID ENDARTERECTOMY  2008   right CEA  .  CHOLECYSTECTOMY  mid 90's  . COLON SURGERY    . COLONOSCOPY    . ENDARTERECTOMY  06/02/2012   Procedure: ENDARTERECTOMY CAROTID;  Surgeon: Serafina Mitchell, MD;  Location: [facility] OR;  Service: Vascular;  Laterality: Left;  . ESOPHAGOGASTRODUODENOSCOPY    . HERNIA REPAIR     umbilical hernia  . PATCH ANGIOPLASTY  06/02/2012   Procedure: PATCH ANGIOPLASTY;  Surgeon: Serafina Mitchell, MD;  Location: [place];  Service: Vascular;  Laterality: Left;  using Vascu-Guard Patch  . PERIPHERAL VASCULAR CATHETERIZATION Right 12/03/2015   Procedure: Peripheral Vascular Balloon Angioplasty;  Surgeon: Serafina Mitchell, MD;  Location: [place] [facility];  Service: Cardiovascular;  Laterality: Right;  RENAL unsuccessful  . RENAL ARTERY STENT  2009   Right renal artery stenting-  Right Kidney  . small intestine removed  2001   d/t random gi bleeding     Current Outpatient Medications  Medication Sig Dispense Refill  . allopurinol (ZYLOPRIM) 100 MG tablet Take 2 tablets (200 mg total) by mouth daily. 180 tablet 3  . amLODipine-atorvastatin (CADUET) 10-40 MG tablet TAKE 1 TABLET DAILY 90 tablet 3  . aspirin 81 MG tablet Take 81 mg by mouth 2 (two) times daily with a meal.     . Cholecalciferol (VITAMIN D3) 50 MCG (2000 UT) TABS Take by mouth.    Marland Kitchen  doxazosin (CARDURA) 2 MG tablet Take 2 mg by mouth 2 (two) times daily.    . ferrous sulfate 325 (65 FE) MG tablet Take 325 mg by mouth in the morning and at bedtime.    . furosemide (LASIX) 40 MG tablet 40 mg. Take two tablets by mouth in the morning and one in the evening    . hydrALAZINE (APRESOLINE) 100 MG tablet Take 1 tablet by mouth 3 (three) times daily.    . potassium chloride (MICRO-K) 10 MEQ CR capsule Take 25 capsules by mouth daily.    . fluticasone (FLONASE) 50 MCG/ACT nasal spray Place 1 spray into both nostrils daily. 16 g 2   No current facility-administered medications for this visit.    Allergies:   Plavix [clopidogrel bisulfate] and Uloric [febuxostat]     Social History:  The patient  reports that he quit smoking about 20 years ago. His smoking use included cigarettes. He has a 54.00 pack-year smoking history. He quit smokeless tobacco use about 20 years ago.  His smokeless tobacco use included chew. He reports that he does not drink alcohol and does not use drugs.   Family History:  The patient's family history includes Clotting disorder in his father; Deep vein thrombosis in his father; Diabetes in his father and sister; Hyperlipidemia in his brother, father, mother, and sister; Hypertension in his brother, father, mother, and sister; Other in his mother and sister; Varicose Veins in his sister.    ROS:  Please see the history of present illness.   Otherwise, review of systems are positive for bruising and increased flatus.   All other systems are reviewed and negative.    PHYSICAL EXAM: VS:  BP (!) 180/70   Pulse 85   Ht 5\' 10"  (1.778 m)   Wt 177 lb (80.3 kg)   BMI 25.40 kg/m  , BMI Body mass index is 25.4 kg/m. GENERAL:  Well appearing HEENT:  Pupils equal round and reactive, fundi not visualized, oral mucosa unremarkable NECK:  No jugular venous distention, waveform within normal limits, carotid upstroke brisk and symmetric, left carotid bruits, no thyromegaly LYMPHATICS:  No cervical, inguinal adenopathy LUNGS:  Clear to auscultation bilaterally BACK:  No CVA tenderness CHEST:  Unremarkable HEART:  PMI not displaced or sustained,S1 and S2 within normal limits, no S3, positive S4, no clicks, no rubs, brief apical systolic murmur murmurs ABD:  Flat, positive bowel sounds normal in frequency in pitch, abdominal bruits, no rebound, no guarding, no midline pulsatile mass, no hepatomegaly, no splenomegaly EXT:  2 plus pulses throughout, moderate bilateral leg edema, no cyanosis no clubbing SKIN:  No rashes no nodules NEURO:  Cranial nerves II through XII grossly intact, motor grossly intact throughout PSYCH:  Cognitively intact,  oriented to person place and time    EKG:  EKG is ordered today. The ekg ordered today demonstrates sinus rhythm, rate 85, axis within normal limits, intervals within normal limits, poor anterior R wave progression   Recent Labs: 02/13/2020: TSH 1.380 08/15/2020: ALT 14; BUN 22; Creatinine, Ser 2.52; Hemoglobin 12.3; Platelets 268; Potassium 3.4; Sodium 138    Lipid Panel    Component Value Date/Time   CHOL 171 08/15/2020 1119   TRIG 131 08/15/2020 1119   HDL 80 08/15/2020 1119   CHOLHDL 2.1 08/15/2020 1119   LDLCALC 69 08/15/2020 1119      Wt Readings from Last 3 Encounters:  09/18/20 177 lb (80.3 kg)  08/15/20 180 lb (81.6 kg)  06/24/20 176 lb (79.8  kg)      Other studies Reviewed: Additional studies/ records that were reviewed today include: Pulmonary notes, labs. Review of the above records demonstrates:  Please see elsewhere in the note.     ASSESSMENT AND PLAN:  RAS:    No change in therapy. He has reasonable risk reduction as below.  CAROTID STENOSIS:   This is followed by [facility]    HTN:    He says his blood pressures in the 140s at home and I think this is reasonable control. He is on maximal therapy of 3 medicines. He might actually have deterioration in his renal function with hypoperfusion. Therefore, I am not going to push his medications further. I do think sleep apnea therapy would be only getting him closer to target blood pressures.  SLEEP APNEA: I think he is very symptomatic with this. We have a long discussion about this. Talked about physiology and the need for adequate treatment and he thinks he will try to restart his CPAP.  HYPERTENSIVE HEART DISEASE: I suspect hypertensive heart disease based on his exam. I will check an echocardiogram.  Current medicines are reviewed at length with the patient today.  The patient does not have concerns regarding medicines.  The following changes have been made:  no change  Labs/ tests ordered today include:    Orders Placed This Encounter  Procedures  . EKG 12-Lead  . ECHOCARDIOGRAM COMPLETE     Disposition:   FU with me as needed based on the results of the above.   Signed, Minus Breeding, MD  09/18/2020 4:37 PM    [facility]

## 2020-09-18 ENCOUNTER — Encounter: Payer: Self-pay | Admitting: Cardiology

## 2020-09-18 ENCOUNTER — Ambulatory Visit (INDEPENDENT_AMBULATORY_CARE_PROVIDER_SITE_OTHER): Payer: Medicare Other | Admitting: Cardiology

## 2020-09-18 ENCOUNTER — Other Ambulatory Visit: Payer: Self-pay

## 2020-09-18 VITALS — BP 180/70 | HR 85 | Ht 70.0 in | Wt 177.0 lb

## 2020-09-18 DIAGNOSIS — I119 Hypertensive heart disease without heart failure: Secondary | ICD-10-CM

## 2020-09-18 DIAGNOSIS — I150 Renovascular hypertension: Secondary | ICD-10-CM

## 2020-09-18 DIAGNOSIS — I6529 Occlusion and stenosis of unspecified carotid artery: Secondary | ICD-10-CM | POA: Diagnosis not present

## 2020-09-18 NOTE — Patient Instructions (Addendum)
Medication Instructions:  The current medical regimen is effective;  continue present plan and medications.  *If you need a refill on your cardiac medications before your next appointment, please call your pharmacy*  Testing/Procedures: Your physician has requested that you have an echocardiogram. Echocardiography is a painless test that uses sound waves to create images of your heart. It provides your doctor with information about the size and shape of your heart and how well your heart's chambers and valves are working. This procedure takes approximately one hour. There are no restrictions for this procedure.  You will be contacted to be scheduled.  Follow-Up: At [facility], you and your health needs are our priority.  As part of our continuing mission to provide you with exceptional heart care, we have created designated Provider Care Teams.  These Care Teams include your primary Cardiologist (physician) and Advanced Practice Providers (APPs -  Physician Assistants and Nurse Practitioners) who all work together to provide you with the care you need, when you need it.  We recommend signing up for the patient portal called "MyChart".  Sign up information is provided on this After Visit Summary.  MyChart is used to connect with patients for Virtual Visits (Telemedicine).  Patients are able to view lab/test results, encounter notes, upcoming appointments, etc.  Non-urgent messages can be sent to your provider as well.   To learn more about what you can do with MyChart, go to [email or web address].    Your next appointment:   Follow up as needed with Dr Percival Spanish  Thank you for choosing [facility]!!

## 2020-10-08 ENCOUNTER — Other Ambulatory Visit: Payer: Self-pay

## 2020-10-08 ENCOUNTER — Ambulatory Visit (HOSPITAL_COMMUNITY)
Admission: RE | Admit: 2020-10-08 | Discharge: 2020-10-08 | Disposition: A | Discharge status: Home / Self Care | Payer: Medicare Other | Source: Ambulatory Visit | Attending: Cardiology | Admitting: Cardiology

## 2020-10-08 DIAGNOSIS — I119 Hypertensive heart disease without heart failure: Secondary | ICD-10-CM | POA: Diagnosis not present

## 2020-10-08 LAB — ECHOCARDIOGRAM COMPLETE
Area-P 1/2: 3.85 cm2
S' Lateral: 3.5 cm

## 2020-10-08 NOTE — Progress Notes (Signed)
*  PRELIMINARY RESULTS* Echocardiogram 2D Echocardiogram has been performed.  James [name] 10/08/2020, 10:39 AM

## 2020-10-10 ENCOUNTER — Telehealth: Payer: Self-pay | Admitting: *Deleted

## 2020-10-10 NOTE — Telephone Encounter (Signed)
Left message to call back and get appointment scheduled next week per Dr Percival Spanish

## 2020-10-10 NOTE — Telephone Encounter (Signed)
-----   Message from Minus Breeding, MD sent at 10/10/2020  8:34 AM EDT ----- James [name] has a large pericardial effusion.  He has no symptoms related to this.  I need to see him next week in the office to discuss.  Could be related to meds.  He might need to have this tapped eventually.  Can double book Monday morning or add on elsewhere.  Call [name] with the results and send results to [name], [name], MD

## 2020-10-10 NOTE — Telephone Encounter (Signed)
Patient scheduled to see Dr Percival Spanish 3/21

## 2020-10-11 ENCOUNTER — Other Ambulatory Visit: Payer: Self-pay | Admitting: Family Medicine

## 2020-10-13 DIAGNOSIS — I3139 Other pericardial effusion (noninflammatory): Secondary | ICD-10-CM | POA: Insufficient documentation

## 2020-10-13 DIAGNOSIS — I313 Pericardial effusion (noninflammatory): Secondary | ICD-10-CM | POA: Insufficient documentation

## 2020-10-13 NOTE — Progress Notes (Signed)
Cardiology Office Note   Date:  10/15/2020   ID:  James [name], DOB [birth year], MRN [id number]  PCP:  [name], [name], MD  Cardiologist:   No primary care provider on file. Referring:  [name], [name], MD  Chief Complaint  Patient presents with  . Follow-up  . Fatigue               History of Present Illness: James [name] is a 75 y.o. male who presents for evaluation of fatigue and difficult to control hypertension.  At an appt recently I ordered an echo to evaluate an abnormal echo.   This demonstrated a large pericardial effusion.  He comes today with his son.  He is describing predominantly fatigue.  He is also feeling cold intolerance and has some decreased memory.  Following our last conversation he did start wearing his CPAP.  His blood pressure he says is reasonably controlled 140-150 range at home no elevated here.  He does not have any shortness of breath.  He does not describe PND or orthopnea.  He said no palpitations, presyncope or syncope.  Past Medical History:  Diagnosis Date  . Arthritis   . Carotid artery occlusion   . DVT (deep venous thrombosis) ([place]) 2008  . GERD (gastroesophageal reflux disease)    takes Protonix daily  . Gout    takes Allopurinol daily and Indomethacin prn  . H/O renal artery stenosis   . H/O: GI bleed   . History of blood transfusion   . History of colon polyps   . History of gastric ulcer   . Hyperlipidemia    takes Lipitor daily  . Hypertension    takes Metoprolol and Amlodipine daily  . Joint pain   . Joint swelling     Past Surgical History:  Procedure Laterality Date  . CAROTID ENDARTERECTOMY  2008   right CEA  . CHOLECYSTECTOMY  mid 90's  . COLON SURGERY    . COLONOSCOPY    . ENDARTERECTOMY  06/02/2012   Procedure: ENDARTERECTOMY CAROTID;  Surgeon: Serafina Mitchell, MD;  Location: [facility] OR;  Service: Vascular;  Laterality: Left;  . ESOPHAGOGASTRODUODENOSCOPY    . HERNIA REPAIR     umbilical  hernia  . PATCH ANGIOPLASTY  06/02/2012   Procedure: PATCH ANGIOPLASTY;  Surgeon: Serafina Mitchell, MD;  Location: [place];  Service: Vascular;  Laterality: Left;  using Vascu-Guard Patch  . PERIPHERAL VASCULAR CATHETERIZATION Right 12/03/2015   Procedure: Peripheral Vascular Balloon Angioplasty;  Surgeon: Serafina Mitchell, MD;  Location: [place] [facility];  Service: Cardiovascular;  Laterality: Right;  RENAL unsuccessful  . RENAL ARTERY STENT  2009   Right renal artery stenting-  Right Kidney  . small intestine removed  2001   d/t random gi bleeding     Current Outpatient Medications  Medication Sig Dispense Refill  . allopurinol (ZYLOPRIM) 100 MG tablet TAKE 2 TABLETS DAILY 180 tablet 3  . amLODipine-atorvastatin (CADUET) 10-40 MG tablet TAKE 1 TABLET DAILY 90 tablet 3  . aspirin 81 MG tablet Take 81 mg by mouth 2 (two) times daily with a meal.     . Cholecalciferol (VITAMIN D3) 50 MCG (2000 UT) TABS Take by mouth.    . doxazosin (CARDURA) 2 MG tablet Take 2 mg by mouth 2 (two) times daily.    . ferrous sulfate 325 (65 FE) MG tablet Take 325 mg by mouth in the morning and at bedtime.    . fluticasone (FLONASE)  50 MCG/ACT nasal spray Place 1 spray into both nostrils daily. 16 g 2  . furosemide (LASIX) 40 MG tablet 40 mg. Take two tablets by mouth in the morning and one in the evening    . potassium chloride (MICRO-K) 10 MEQ CR capsule Take 25 capsules by mouth daily.     No current facility-administered medications for this visit.    Allergies:   Plavix [clopidogrel bisulfate] and Uloric [febuxostat]    ROS:  Please see the history of present illness.   Otherwise, review of systems are decreased memory and cold intolerance.   All other systems are reviewed and negative.    PHYSICAL EXAM: VS:  BP (!) 184/62 (BP Location: Left Arm, Patient Position: Sitting)   Pulse 84   Ht 5\' 10"  (1.778 m)   Wt 180 lb 12.8 oz (82 kg)   SpO2 95%   BMI 25.94 kg/m  , BMI Body mass index is 25.94  kg/m. GENERAL:  Well appearing NECK: Positive jugular venous distention, waveform within normal limits, carotid upstroke brisk and symmetric, no bruits, no thyromegaly LUNGS:  Clear to auscultation bilaterally CHEST:  Unremarkable HEART:  PMI not displaced or sustained,S1 and S2 within normal limits, no S3, no S4, no clicks, no rubs, no murmurs ABD:  Flat, positive bowel sounds normal in frequency in pitch, no bruits, no rebound, no guarding, no midline pulsatile mass, no hepatomegaly, no splenomegaly, umbilical hernia EXT:  2 plus pulses throughout, moderate bilateral leg edema, no cyanosis no clubbing  EKG:  EKG is not ordered today.    Recent Labs: 02/13/2020: TSH 1.380 08/15/2020: ALT 14; BUN 22; Creatinine, Ser 2.52; Hemoglobin 12.3; Platelets 268; Potassium 3.4; Sodium 138    Lipid Panel    Component Value Date/Time   CHOL 171 08/15/2020 1119   TRIG 131 08/15/2020 1119   HDL 80 08/15/2020 1119   CHOLHDL 2.1 08/15/2020 1119   LDLCALC 69 08/15/2020 1119      Wt Readings from Last 3 Encounters:  10/14/20 180 lb 12.8 oz (82 kg)  09/18/20 177 lb (80.3 kg)  08/15/20 180 lb (81.6 kg)      Other studies Reviewed: Additional studies/ records that were reviewed today include: Echo reviewed with the patient in the room Review of the above records demonstrates:  Please see elsewhere in the note.     ASSESSMENT AND PLAN:  PERICARDIAL EFFUSION: The etiology of this could be related to his renal insufficiency or his hydralazine.  I cannot exclude a viral etiology.  I will check TSH T3 and T4 given his cold intolerance and fatigue as well.  I feel obligated to stop the hydralazine and I am going to measure this again with an echocardiogram in a few weeks.  He has no pulses paradoxus.  He is not hypotensive or short of breath.  He symptomatically does not have tamponade though I discussed this with the patient and his son today.  He may ultimately need to have elective  pericardiocentesis.  RAS: He has had this followed by nephrology.   CAROTID STENOSIS:   This is followed by [facility].    HTN:    Today I am taking him off his hydralazine.  His choices of blood pressure medication are limited because of his renal insufficiency.  I would probably entertain putting him on clonidine which he says he was taken off of in the past but he is not sure why.  I suspect it was because of fatigue but this would probably  be the best next choice.  He will keep a blood pressure diary.   SLEEP APNEA: He understands importance of using a mask and he seems to now be using it.  Current medicines are reviewed at length with the patient today.  The patient does not have concerns regarding medicines.  The following changes have been made:  no change  Labs/ tests ordered today include:   Orders Placed This Encounter  Procedures  . ECHOCARDIOGRAM COMPLETE     Disposition:   FU with me after the echocardiogram in [place], Minus Breeding, MD  10/15/2020 11:36 AM    [place]

## 2020-10-14 ENCOUNTER — Encounter: Payer: Self-pay | Admitting: Cardiology

## 2020-10-14 ENCOUNTER — Ambulatory Visit (INDEPENDENT_AMBULATORY_CARE_PROVIDER_SITE_OTHER): Payer: Medicare Other | Admitting: Cardiology

## 2020-10-14 ENCOUNTER — Other Ambulatory Visit: Payer: Self-pay

## 2020-10-14 VITALS — BP 184/62 | HR 84 | Ht 70.0 in | Wt 180.8 lb

## 2020-10-14 DIAGNOSIS — I10 Essential (primary) hypertension: Secondary | ICD-10-CM

## 2020-10-14 DIAGNOSIS — I313 Pericardial effusion (noninflammatory): Secondary | ICD-10-CM

## 2020-10-14 DIAGNOSIS — I3139 Other pericardial effusion (noninflammatory): Secondary | ICD-10-CM

## 2020-10-14 DIAGNOSIS — I6529 Occlusion and stenosis of unspecified carotid artery: Secondary | ICD-10-CM

## 2020-10-14 DIAGNOSIS — G4730 Sleep apnea, unspecified: Secondary | ICD-10-CM | POA: Diagnosis not present

## 2020-10-14 NOTE — Patient Instructions (Signed)
Medication Instructions:  STOP hydralazine  *If you need a refill on your cardiac medications before your next appointment, please call your pharmacy*  Testing/Procedures: Your physician has requested that you have an echocardiogram (around April 15th). Echocardiography is a painless test that uses sound waves to create images of your heart. It provides your doctor with information about the size and shape of your heart and how well your heart's chambers and valves are working. This procedure takes approximately one hour. There are no restrictions for this procedure. This will be done at our [facility] location:  [place]: At [facility], you and your health needs are our priority.  As part of our continuing mission to provide you with exceptional heart care, we have created designated Provider Care Teams.  These Care Teams include your primary Cardiologist (physician) and Advanced Practice Providers (APPs -  Physician Assistants and Nurse Practitioners) who all work together to provide you with the care you need, when you need it.  We recommend signing up for the patient portal called "MyChart".  Sign up information is provided on this After Visit Summary.  MyChart is used to connect with patients for Virtual Visits (Telemedicine).  Patients are able to view lab/test results, encounter notes, upcoming appointments, etc.  Non-urgent messages can be sent to your provider as well.   To learn more about what you can do with MyChart, go to [email or web address].    Your next appointment:   After echocardiogram with Dr. Percival Spanish in [place]

## 2020-10-15 ENCOUNTER — Encounter: Payer: Self-pay | Admitting: Cardiology

## 2020-10-16 DIAGNOSIS — N184 Chronic kidney disease, stage 4 (severe): Secondary | ICD-10-CM | POA: Diagnosis not present

## 2020-10-21 DIAGNOSIS — N2581 Secondary hyperparathyroidism of renal origin: Secondary | ICD-10-CM | POA: Diagnosis not present

## 2020-10-21 DIAGNOSIS — I313 Pericardial effusion (noninflammatory): Secondary | ICD-10-CM | POA: Diagnosis not present

## 2020-10-21 DIAGNOSIS — I701 Atherosclerosis of renal artery: Secondary | ICD-10-CM | POA: Diagnosis not present

## 2020-10-21 DIAGNOSIS — D631 Anemia in chronic kidney disease: Secondary | ICD-10-CM | POA: Diagnosis not present

## 2020-10-21 DIAGNOSIS — I129 Hypertensive chronic kidney disease with stage 1 through stage 4 chronic kidney disease, or unspecified chronic kidney disease: Secondary | ICD-10-CM | POA: Diagnosis not present

## 2020-10-21 DIAGNOSIS — N62 Hypertrophy of breast: Secondary | ICD-10-CM | POA: Diagnosis not present

## 2020-10-21 DIAGNOSIS — N184 Chronic kidney disease, stage 4 (severe): Secondary | ICD-10-CM | POA: Diagnosis not present

## 2020-10-21 DIAGNOSIS — E876 Hypokalemia: Secondary | ICD-10-CM | POA: Diagnosis not present

## 2020-10-21 DIAGNOSIS — N281 Cyst of kidney, acquired: Secondary | ICD-10-CM | POA: Diagnosis not present

## 2020-11-08 ENCOUNTER — Ambulatory Visit (HOSPITAL_COMMUNITY)
Admission: RE | Admit: 2020-11-08 | Discharge: 2020-11-08 | Disposition: A | Discharge status: Home / Self Care | Payer: Medicare Other | Source: Ambulatory Visit | Attending: Cardiology | Admitting: Cardiology

## 2020-11-08 ENCOUNTER — Other Ambulatory Visit: Payer: Self-pay

## 2020-11-08 DIAGNOSIS — I313 Pericardial effusion (noninflammatory): Secondary | ICD-10-CM | POA: Diagnosis not present

## 2020-11-08 DIAGNOSIS — I3139 Other pericardial effusion (noninflammatory): Secondary | ICD-10-CM

## 2020-11-08 LAB — ECHOCARDIOGRAM COMPLETE
Area-P 1/2: 2.86 cm2
S' Lateral: 3.06 cm

## 2020-11-08 NOTE — Progress Notes (Signed)
*  PRELIMINARY RESULTS* Echocardiogram 2D Echocardiogram has been performed.  Leavy Cella 11/08/2020, 1:48 PM

## 2020-11-20 NOTE — Progress Notes (Signed)
Cardiology Office Note   Date:  11/21/2020   ID:  James [name], DOB 10/18/1945, MRN [id number]  PCP:  [name], [name], MD  Cardiologist:   No primary care provider on file. Referring:  [name], [name], MD  Chief Complaint  Patient presents with  . Fatigue      History of Present Illness: James [name] is a 75 y.o. male who presents for evaluation of fatigue and difficult to control hypertension.  At an appt recently I ordered an echo to evaluate an abnormal echo.   This demonstrated a large pericardial effusion.  I sent him for follow echo and it was still large although it was possibly mild less than previous.      Hydralazine was discontinued.  He has had no symptoms or signs of tamponade.  He brings in extensive blood pressure diary and his blood pressures been in the 782U to 235T systolic with normal diastolics.  Heart rates have been in the 70s to 80s for the potentially lower.  His nephrologist restarted clonidine 0.1 mg twice daily.  However, they stopped the p.m. dose because he was having nosebleeds.  He still complains of fatigue.  He has trouble wearing his CPAP however.  He has to adjust it and it comes off and he has to take it off to go to the bathroom.  He has had no new shortness of breath, PND or orthopnea.  Has had no new palpitations, presyncope or syncope.   Past Medical History:  Diagnosis Date  . Arthritis   . Carotid artery occlusion   . DVT (deep venous thrombosis) ([place]) 2008  . GERD (gastroesophageal reflux disease)    takes Protonix daily  . Gout    takes Allopurinol daily and Indomethacin prn  . H/O renal artery stenosis   . H/O: GI bleed   . History of blood transfusion   . History of colon polyps   . History of gastric ulcer   . Hyperlipidemia    takes Lipitor daily  . Hypertension    takes Metoprolol and Amlodipine daily  . Joint pain   . Joint swelling     Past Surgical History:  Procedure Laterality Date  . CAROTID  ENDARTERECTOMY  2008   right CEA  . CHOLECYSTECTOMY  mid 90's  . COLON SURGERY    . COLONOSCOPY    . ENDARTERECTOMY  06/02/2012   Procedure: ENDARTERECTOMY CAROTID;  Surgeon: Serafina Mitchell, MD;  Location: [facility] OR;  Service: Vascular;  Laterality: Left;  . ESOPHAGOGASTRODUODENOSCOPY    . HERNIA REPAIR     umbilical hernia  . PATCH ANGIOPLASTY  06/02/2012   Procedure: PATCH ANGIOPLASTY;  Surgeon: Serafina Mitchell, MD;  Location: [place];  Service: Vascular;  Laterality: Left;  using Vascu-Guard Patch  . PERIPHERAL VASCULAR CATHETERIZATION Right 12/03/2015   Procedure: Peripheral Vascular Balloon Angioplasty;  Surgeon: Serafina Mitchell, MD;  Location: [place] [facility];  Service: Cardiovascular;  Laterality: Right;  RENAL unsuccessful  . RENAL ARTERY STENT  2009   Right renal artery stenting-  Right Kidney  . small intestine removed  2001   d/t random gi bleeding     Current Outpatient Medications  Medication Sig Dispense Refill  . allopurinol (ZYLOPRIM) 100 MG tablet TAKE 2 TABLETS DAILY 180 tablet 3  . amLODipine-atorvastatin (CADUET) 10-40 MG tablet TAKE 1 TABLET DAILY 90 tablet 3  . aspirin 81 MG tablet Take 81 mg by mouth 2 (two) times daily with  a meal.     . Cholecalciferol (VITAMIN D3) 50 MCG (2000 UT) TABS Take by mouth.    . cloNIDine HCl (KAPVAY) 0.1 MG TB12 ER tablet Take 0.1 mg by mouth daily.    Marland Kitchen doxazosin (CARDURA) 2 MG tablet Take 2 mg by mouth 2 (two) times daily.    . ferrous sulfate 325 (65 FE) MG tablet Take 325 mg by mouth in the morning and at bedtime.    . fluticasone (FLONASE) 50 MCG/ACT nasal spray Place 1 spray into both nostrils daily. 16 g 2  . furosemide (LASIX) 40 MG tablet 40 mg. Take two tablets by mouth in the morning and one in the evening    . potassium chloride (MICRO-K) 10 MEQ CR capsule Take 10 mEq by mouth daily. TAKE 1 TABLET DAILY     No current facility-administered medications for this visit.    Allergies:   Plavix [clopidogrel bisulfate] and  Uloric [febuxostat]    ROS:  Please see the history of present illness.   Otherwise, review of systems are for none .   All other systems are reviewed and negative.    PHYSICAL EXAM: VS:  BP (!) 160/84   Pulse 87   Ht 5\' 10"  (1.778 m)   Wt 175 lb 3.2 oz (79.5 kg)   SpO2 95%   BMI 25.14 kg/m  , BMI Body mass index is 25.14 kg/m. GENERAL:  Well appearing NECK:  No jugular venous distention, waveform within normal limits, carotid upstroke brisk and symmetric, no bruits, no thyromegaly LUNGS:  Clear to auscultation bilaterally CHEST:  Unremarkable HEART:  PMI not displaced or sustained,S1 and S2 within normal limits, no S3, no S4, no clicks, no rubs, no murmurs ABD:  Flat, positive bowel sounds normal in frequency in pitch, positive midline bruits and renal bruits bruits, no rebound, no guarding, no midline pulsatile mass, no hepatomegaly, no splenomegaly EXT:  2 plus pulses throughout, no edema, no cyanosis no clubbing   EKG:  EKG is not ordered today.   Recent Labs: 02/13/2020: TSH 1.380 08/15/2020: ALT 14; BUN 22; Creatinine, Ser 2.52; Hemoglobin 12.3; Platelets 268; Potassium 3.4; Sodium 138    Lipid Panel    Component Value Date/Time   CHOL 171 08/15/2020 1119   TRIG 131 08/15/2020 1119   HDL 80 08/15/2020 1119   CHOLHDL 2.1 08/15/2020 1119   LDLCALC 69 08/15/2020 1119      Wt Readings from Last 3 Encounters:  11/21/20 175 lb 3.2 oz (79.5 kg)  10/14/20 180 lb 12.8 oz (82 kg)  09/18/20 177 lb (80.3 kg)      Other studies Reviewed: Additional studies/ records that were reviewed today include: Extensive review of blood pressure readings, echo Review of the above records demonstrates:  Please see elsewhere in the note.     ASSESSMENT AND PLAN:  PERICARDIAL EFFUSION:     This was slightly less prominent previously.  There is a small chance it could have been related to his hydralazine which I discontinued.  He certainly does not have any tamponade physiology.  It  was suggested that perhaps his fatigue is related to his effusion but I could not at all guarantee him that he would have less fatigue if he had a pericardiocentesis.  Since he is not having tamponade and overt symptoms related to this I would like to follow this with a repeat echo in a few months.  I am going to see him back prior to this.  If it  continues and is not slowly resolving as it has been to date he might need a pericardiocentesis.  I had a long discussion with the patient and his son about this.   FATIGUE: I am going to recheck his TSH and a T4 and T3 as the effusion and his fatigue could be related to hypothyroidism.  RAS:   He is followed by [facility].  He was not thought to need any further percutaneous revascularization but I may move up his appointment if his blood pressure remains so difficult to control.   CAROTID STENOSIS:   This is followed by [facility].      HTN:    Today I encouraged him to restart the second dose of clonidine.  I am not can make other changes to his meds at this point.  They will keep a blood pressure diary.  I also encouraged continued compliance and trying with his CPAP.   CKD: He has renal insufficiency and creatinine of 2.44 in March.  He is followed by nephrology.  SLEEP APNEA: As above  Current medicines are reviewed at length with the patient today.  The patient does not have concerns regarding medicines.  The following changes have been made:   As above  Labs/ tests ordered today include:   Orders Placed This Encounter  Procedures  . TSH  . T3  . T4, free     Disposition:   FU with me in 2 months   Signed, Minus Breeding, MD  11/21/2020 5:48 PM    [place]

## 2020-11-21 ENCOUNTER — Encounter: Payer: Self-pay | Admitting: Cardiology

## 2020-11-21 ENCOUNTER — Other Ambulatory Visit: Payer: Self-pay

## 2020-11-21 ENCOUNTER — Ambulatory Visit (INDEPENDENT_AMBULATORY_CARE_PROVIDER_SITE_OTHER): Payer: Medicare Other | Admitting: Cardiology

## 2020-11-21 VITALS — BP 160/84 | HR 87 | Ht 70.0 in | Wt 175.2 lb

## 2020-11-21 DIAGNOSIS — I701 Atherosclerosis of renal artery: Secondary | ICD-10-CM | POA: Diagnosis not present

## 2020-11-21 DIAGNOSIS — R5383 Other fatigue: Secondary | ICD-10-CM | POA: Diagnosis not present

## 2020-11-21 DIAGNOSIS — I10 Essential (primary) hypertension: Secondary | ICD-10-CM | POA: Diagnosis not present

## 2020-11-21 DIAGNOSIS — Z79899 Other long term (current) drug therapy: Secondary | ICD-10-CM | POA: Diagnosis not present

## 2020-11-21 DIAGNOSIS — I313 Pericardial effusion (noninflammatory): Secondary | ICD-10-CM | POA: Diagnosis not present

## 2020-11-21 DIAGNOSIS — I3139 Other pericardial effusion (noninflammatory): Secondary | ICD-10-CM

## 2020-11-21 DIAGNOSIS — G4733 Obstructive sleep apnea (adult) (pediatric): Secondary | ICD-10-CM

## 2020-11-21 NOTE — Patient Instructions (Signed)
Medication Instructions:  Continue current medications  *If you need a refill on your cardiac medications before your next appointment, please call your pharmacy*   Lab Work: TSH, T3, Free T4  If you have labs (blood work) drawn today and your tests are completely normal, you will receive your results only by: Marland Kitchen MyChart Message (if you have MyChart) OR . A paper copy in the mail If you have any lab test that is abnormal or we need to change your treatment, we will call you to review the results.   Testing/Procedures: None Ordered   Follow-Up: At [facility], you and your health needs are our priority.  As part of our continuing mission to provide you with exceptional heart care, we have created designated Provider Care Teams.  These Care Teams include your primary Cardiologist (physician) and Advanced Practice Providers (APPs -  Physician Assistants and Nurse Practitioners) who all work together to provide you with the care you need, when you need it.  We recommend signing up for the patient portal called "MyChart".  Sign up information is provided on this After Visit Summary.  MyChart is used to connect with patients for Virtual Visits (Telemedicine).  Patients are able to view lab/test results, encounter notes, upcoming appointments, etc.  Non-urgent messages can be sent to your provider as well.   To learn more about what you can do with MyChart, go to [email or web address].    Your next appointment:   2 month(s)  The format for your next appointment:   In Person  Provider:   You may see Minus Breeding, MD or one of the following Advanced Practice Providers on your designated Care Team:    Rosaria Ferries, PA-C  Jory Sims, DNP, ANP

## 2020-11-22 LAB — T3: T3, Total: 124 ng/dL (ref 71–180)

## 2020-11-22 LAB — TSH: TSH: 1.26 u[IU]/mL (ref 0.450–4.500)

## 2020-11-22 LAB — T4, FREE: Free T4: 1.31 ng/dL (ref 0.82–1.77)

## 2021-01-22 DIAGNOSIS — R5383 Other fatigue: Secondary | ICD-10-CM | POA: Insufficient documentation

## 2021-01-22 NOTE — Progress Notes (Deleted)
Cardiology Office Note   Date:  01/22/2021   ID:  James [name], DOB 01/11/1946, MRN [id number]  PCP:  [name], [name], MD  Cardiologist:   None Referring:  [name], [name], MD  No chief complaint on file.     History of Present Illness: James [name] is a 75 y.o. male who presents for evaluation of fatigue and difficult to control hypertension.  At an appt recently I ordered an echo to evaluate an abnormal EKG.   This demonstrated a large pericardial effusion.  I sent him for follow echo and it was still large although it was possibly mild less than previous.   I discontinued hydralazine.  He had no evidence of tamponade.  ***   ***  ***  Hydralazine was discontinued.  He has had no symptoms or signs of tamponade.  He brings in extensive blood pressure diary and his blood pressures been in the 630Z to 601U systolic with normal diastolics.  Heart rates have been in the 70s to 80s for the potentially lower.  His nephrologist restarted clonidine 0.1 mg twice daily.  However, they stopped the p.m. dose because he was having nosebleeds.  He still complains of fatigue.  He has trouble wearing his CPAP however.  He has to adjust it and it comes off and he has to take it off to go to the bathroom.  He has had no new shortness of breath, PND or orthopnea.  Has had no new palpitations, presyncope or syncope.   Past Medical History:  Diagnosis Date   Arthritis    Carotid artery occlusion    DVT (deep venous thrombosis) ([place]) 2008   GERD (gastroesophageal reflux disease)    takes Protonix daily   Gout    takes Allopurinol daily and Indomethacin prn   H/O renal artery stenosis    H/O: GI bleed    History of blood transfusion    History of colon polyps    History of gastric ulcer    Hyperlipidemia    takes Lipitor daily   Hypertension    takes Metoprolol and Amlodipine daily   Joint pain    Joint swelling     Past Surgical History:  Procedure Laterality Date    CAROTID ENDARTERECTOMY  2008   right CEA   CHOLECYSTECTOMY  mid 90's   COLON SURGERY     COLONOSCOPY     ENDARTERECTOMY  06/02/2012   Procedure: ENDARTERECTOMY CAROTID;  Surgeon: Serafina Mitchell, MD;  Location: [place];  Service: Vascular;  Laterality: Left;   ESOPHAGOGASTRODUODENOSCOPY     HERNIA REPAIR     umbilical hernia   PATCH ANGIOPLASTY  06/02/2012   Procedure: PATCH ANGIOPLASTY;  Surgeon: Serafina Mitchell, MD;  Location: [place];  Service: Vascular;  Laterality: Left;  using Vascu-Guard Patch   PERIPHERAL VASCULAR CATHETERIZATION Right 12/03/2015   Procedure: Peripheral Vascular Balloon Angioplasty;  Surgeon: Serafina Mitchell, MD;  Location: [place] [facility];  Service: Cardiovascular;  Laterality: Right;  RENAL unsuccessful   RENAL ARTERY STENT  2009   Right renal artery stenting-  Right Kidney   small intestine removed  2001   d/t random gi bleeding     Current Outpatient Medications  Medication Sig Dispense Refill   allopurinol (ZYLOPRIM) 100 MG tablet TAKE 2 TABLETS DAILY 180 tablet 3   amLODipine-atorvastatin (CADUET) 10-40 MG tablet TAKE 1 TABLET DAILY 90 tablet 3   aspirin 81 MG tablet Take 81 mg by mouth  2 (two) times daily with a meal.      Cholecalciferol (VITAMIN D3) 50 MCG (2000 UT) TABS Take by mouth.     cloNIDine HCl (KAPVAY) 0.1 MG TB12 ER tablet Take 0.1 mg by mouth daily.     doxazosin (CARDURA) 2 MG tablet Take 2 mg by mouth 2 (two) times daily.     ferrous sulfate 325 (65 FE) MG tablet Take 325 mg by mouth in the morning and at bedtime.     fluticasone (FLONASE) 50 MCG/ACT nasal spray Place 1 spray into both nostrils daily. 16 g 2   furosemide (LASIX) 40 MG tablet 40 mg. Take two tablets by mouth in the morning and one in the evening     potassium chloride (MICRO-K) 10 MEQ CR capsule Take 10 mEq by mouth daily. TAKE 1 TABLET DAILY     No current facility-administered medications for this visit.    Allergies:   Plavix [clopidogrel bisulfate] and Uloric  [febuxostat]    ROS:  Please see the history of present illness.   Otherwise, review of systems are for *** .   All other systems are reviewed and negative.    PHYSICAL EXAM: VS:  There were no vitals taken for this visit. , BMI There is no height or weight on file to calculate BMI. GENERAL:  Well appearing NECK:  No jugular venous distention, waveform within normal limits, carotid upstroke brisk and symmetric, no bruits, no thyromegaly LUNGS:  Clear to auscultation bilaterally CHEST:  Unremarkable HEART:  PMI not displaced or sustained,S1 and S2 within normal limits, no S3, no S4, no clicks, no rubs, *** murmurs ABD:  Flat, positive bowel sounds normal in frequency in pitch, no bruits, no rebound, no guarding, no midline pulsatile mass, no hepatomegaly, no splenomegaly EXT:  2 plus pulses throughout, no edema, no cyanosis no clubbing    ***GENERAL:  Well appearing NECK:  No jugular venous distention, waveform within normal limits, carotid upstroke brisk and symmetric, no bruits, no thyromegaly LUNGS:  Clear to auscultation bilaterally CHEST:  Unremarkable HEART:  PMI not displaced or sustained,S1 and S2 within normal limits, no S3, no S4, no clicks, no rubs, no murmurs ABD:  Flat, positive bowel sounds normal in frequency in pitch, positive midline bruits and renal bruits bruits, no rebound, no guarding, no midline pulsatile mass, no hepatomegaly, no splenomegaly EXT:  2 plus pulses throughout, no edema, no cyanosis no clubbing   EKG:  EKG is *** ordered today. ***  Recent Labs: 08/15/2020: ALT 14; BUN 22; Creatinine, Ser 2.52; Hemoglobin 12.3; Platelets 268; Potassium 3.4; Sodium 138 11/21/2020: TSH 1.260    Lipid Panel    Component Value Date/Time   CHOL 171 08/15/2020 1119   TRIG 131 08/15/2020 1119   HDL 80 08/15/2020 1119   CHOLHDL 2.1 08/15/2020 1119   LDLCALC 69 08/15/2020 1119      Wt Readings from Last 3 Encounters:  11/21/20 175 lb 3.2 oz (79.5 kg)  10/14/20  180 lb 12.8 oz (82 kg)  09/18/20 177 lb (80.3 kg)      Other studies Reviewed: Additional studies/ records that were reviewed today include: *** Review of the above records demonstrates:  Please see elsewhere in the note.     ASSESSMENT AND PLAN:  PERICARDIAL EFFUSION:     *** This was slightly less prominent previously.  There is a small chance it could have been related to his hydralazine which I discontinued.  He certainly does not have any tamponade  physiology.  It was suggested that perhaps his fatigue is related to his effusion but I could not at all guarantee him that he would have less fatigue if he had a pericardiocentesis.  Since he is not having tamponade and overt symptoms related to this I would like to follow this with a repeat echo in a few months.  I am going to see him back prior to this.  If it continues and is not slowly resolving as it has been to date he might need a pericardiocentesis.  I had a long discussion with the patient and his son about this.   FATIGUE:   Thyroid studies were normal.  ***   I am going to recheck his TSH and a T4 and T3 as the effusion and his fatigue could be related to hypothyroidism.  RAS:   He is followed by [facility].  ***  He was not thought to need any further percutaneous revascularization but I may move up his appointment if his blood pressure remains so difficult to control.   CAROTID STENOSIS:    *** This is followed by [facility].      HTN:    ***  Today I encouraged him to restart the second dose of clonidine.  I am not can make other changes to his meds at this point.  They will keep a blood pressure diary.  I also encouraged continued compliance and trying with his CPAP.   CKD:    Creat has been  ***  He has renal insufficiency and creatinine of 2.44 in March.  He is followed by nephrology.  SLEEP APNEA:    ***As above  Current medicines are reviewed at length with the patient today.  The patient does not have concerns regarding  medicines.  The following changes have been made:    *** Labs/ tests ordered today include: ***  No orders of the defined types were placed in this encounter.    Disposition:   FU with me in *** months   Signed, Minus Breeding, MD  01/22/2021 8:15 PM    [facility]

## 2021-01-23 ENCOUNTER — Ambulatory Visit: Payer: Medicare Other | Admitting: Cardiology

## 2021-01-24 ENCOUNTER — Encounter: Payer: Self-pay | Admitting: Cardiology

## 2021-01-24 ENCOUNTER — Ambulatory Visit (INDEPENDENT_AMBULATORY_CARE_PROVIDER_SITE_OTHER): Payer: Medicare Other | Admitting: Cardiology

## 2021-01-24 ENCOUNTER — Other Ambulatory Visit: Payer: Self-pay

## 2021-01-24 VITALS — BP 170/60 | HR 81 | Ht 70.0 in | Wt 177.6 lb

## 2021-01-24 DIAGNOSIS — I701 Atherosclerosis of renal artery: Secondary | ICD-10-CM | POA: Diagnosis not present

## 2021-01-24 DIAGNOSIS — I3139 Other pericardial effusion (noninflammatory): Secondary | ICD-10-CM

## 2021-01-24 DIAGNOSIS — I313 Pericardial effusion (noninflammatory): Secondary | ICD-10-CM | POA: Diagnosis not present

## 2021-01-24 NOTE — Progress Notes (Signed)
Cardiology Office Note   Date:  01/24/2021   ID:  James [name], DOB 02-12-1946, MRN [id number]  PCP:  [name], [name], MD  Cardiologist:   None Referring:  [name], [name], MD  Chief Complaint  Patient presents with   Fatigue     History of Present Illness: James [name] is a 75 y.o. male who presents for evaluation of fatigue and difficult to control hypertension.  At an appt recently I ordered an echo to evaluate an abnormal EKG.   This demonstrated a large pericardial effusion.  I sent him for follow echo and it was still large although it was possibly mild less than previous.   I discontinued hydralazine.  He had no evidence of tamponade.    Since I last saw him he has had no new cardiovascular complaints.  His biggest issue is still not having the stamina to do what he wants.  He has cold intolerance and fatigue but I did check T3-T4 and TSH and they were normal.  He is eating well.  He is not having any palpitations, presyncope or syncope.  He said no shortness of breath, PND or orthopnea.  He has had no chest pressure, neck or arm discomfort.  He had his creatinine followed and I do not have the most recent labs but his son thinks it was stable and he is seeing nephrology routinely.  His blood pressures at home been in the 097D systolic.   Past Medical History:  Diagnosis Date   Arthritis    Carotid artery occlusion    DVT (deep venous thrombosis) ([place]) 2008   GERD (gastroesophageal reflux disease)    takes Protonix daily   Gout    takes Allopurinol daily and Indomethacin prn   H/O renal artery stenosis    H/O: GI bleed    History of blood transfusion    History of colon polyps    History of gastric ulcer    Hyperlipidemia    takes Lipitor daily   Hypertension    takes Metoprolol and Amlodipine daily   Joint pain    Joint swelling     Past Surgical History:  Procedure Laterality Date   CAROTID ENDARTERECTOMY  2008   right CEA    CHOLECYSTECTOMY  mid 90's   COLON SURGERY     COLONOSCOPY     ENDARTERECTOMY  06/02/2012   Procedure: ENDARTERECTOMY CAROTID;  Surgeon: Serafina Mitchell, MD;  Location: [facility];  Service: Vascular;  Laterality: Left;   ESOPHAGOGASTRODUODENOSCOPY     HERNIA REPAIR     umbilical hernia   PATCH ANGIOPLASTY  06/02/2012   Procedure: PATCH ANGIOPLASTY;  Surgeon: Serafina Mitchell, MD;  Location: [facility];  Service: Vascular;  Laterality: Left;  using Vascu-Guard Patch   PERIPHERAL VASCULAR CATHETERIZATION Right 12/03/2015   Procedure: Peripheral Vascular Balloon Angioplasty;  Surgeon: Serafina Mitchell, MD;  Location: [facility];  Service: Cardiovascular;  Laterality: Right;  RENAL unsuccessful   RENAL ARTERY STENT  2009   Right renal artery stenting-  Right Kidney   small intestine removed  2001   d/t random gi bleeding     Current Outpatient Medications  Medication Sig Dispense Refill   allopurinol (ZYLOPRIM) 100 MG tablet TAKE 2 TABLETS DAILY 180 tablet 3   amLODipine-atorvastatin (CADUET) 10-40 MG tablet TAKE 1 TABLET DAILY 90 tablet 3   aspirin 81 MG tablet Take 81 mg by mouth 2 (two) times daily with a meal.  Cholecalciferol (VITAMIN D3) 50 MCG (2000 UT) TABS Take by mouth.     cloNIDine HCl (KAPVAY) 0.1 MG TB12 ER tablet Take 0.1 mg by mouth 2 (two) times daily.     doxazosin (CARDURA) 2 MG tablet Take 2 mg by mouth 2 (two) times daily.     ferrous sulfate 325 (65 FE) MG tablet Take 325 mg by mouth in the morning and at bedtime.     fluticasone (FLONASE) 50 MCG/ACT nasal spray Place 1 spray into both nostrils daily. 16 g 2   furosemide (LASIX) 40 MG tablet 40 mg. Take two tablets by mouth in the morning and one in the evening     potassium chloride (MICRO-K) 10 MEQ CR capsule Take 10 mEq by mouth daily. TAKE 1 TABLET DAILY     No current facility-administered medications for this visit.    Allergies:   Plavix [clopidogrel bisulfate] and Uloric [febuxostat]    ROS:  Please see the  history of present illness.   Otherwise, review of systems are for none .   All other systems are reviewed and negative.    PHYSICAL EXAM: VS:  BP (!) 170/60 (BP Location: Right Arm)   Pulse 81   Ht 5\' 10"  (1.778 m)   Wt 177 lb 9.6 oz (80.6 kg)   SpO2 98%   BMI 25.48 kg/m  , BMI Body mass index is 25.48 kg/m. GENERAL:  Well appearing NECK:  No jugular venous distention, waveform within normal limits, carotid upstroke brisk and symmetric, no bruits, no thyromegaly LUNGS:  Clear to auscultation bilaterally CHEST:  Unremarkable HEART:  PMI not displaced or sustained,S1 and S2 within normal limits, no S3, no S4, no clicks, no rubs, no murmurs ABD:  Flat, positive bowel sounds normal in frequency in pitch, no bruits, no rebound, no guarding, no midline pulsatile mass, no hepatomegaly, no splenomegaly EXT:  2 plus pulses throughout, moderate bilateral lower extremity edema, no cyanosis no clubbing   EKG:  EKG is not ordered today.   Recent Labs: 08/15/2020: ALT 14; BUN 22; Creatinine, Ser 2.52; Hemoglobin 12.3; Platelets 268; Potassium 3.4; Sodium 138 11/21/2020: TSH 1.260    Lipid Panel    Component Value Date/Time   CHOL 171 08/15/2020 1119   TRIG 131 08/15/2020 1119   HDL 80 08/15/2020 1119   CHOLHDL 2.1 08/15/2020 1119   LDLCALC 69 08/15/2020 1119      Wt Readings from Last 3 Encounters:  01/24/21 177 lb 9.6 oz (80.6 kg)  11/21/20 175 lb 3.2 oz (79.5 kg)  10/14/20 180 lb 12.8 oz (82 kg)      Other studies Reviewed: Additional studies/ records that were reviewed today include: None Review of the above records demonstrates:  NA   ASSESSMENT AND PLAN:  PERICARDIAL EFFUSION:      This has been chronic and he has no symptoms consistent with tamponade.  I am going to follow this up with an echocardiogram again in October.    FATIGUE:   Thyroid studies were normal.  I do not have cardiac etiology for this.  No further work-up is suggested.  He does have to get up to go to  the bathroom multiple times at night so he is not sleeping through the night which could contribute.  RAS:   He is followed by [facility].  I am going to move up this appointment with Dr. Trula Slade.  He has had progressive renal insufficiency and difficult to control hypertension and I do not know if  there is any other options for the occluded renal artery that he has had status post stenting.   CAROTID STENOSIS:    This is followed by [facility].    HTN:    Today I am going to continue the meds as listed.  He gets agitated so his blood pressure is elevated here but is better controlled at home.  He would be intolerant of multiple medications because of his renal insufficiency.  I stopped hydralazine previously because of his effusion wondering if this could contribute.   CKD:    Creat has been followed closely by nephrology.   Current medicines are reviewed at length with the patient today.  The patient does not have concerns regarding medicines.  The following changes have been made:    None Labs/ tests ordered today include:   Orders Placed This Encounter  Procedures   ECHOCARDIOGRAM COMPLETE    Disposition:   FU with me in October after the cath.   Signed, Minus Breeding, MD  01/24/2021 4:56 PM     [facility]

## 2021-01-24 NOTE — Patient Instructions (Signed)
Medication Instructions:  Your physician recommends that you continue on your current medications as directed. Please refer to the Current Medication list given to you today.  *If you need a refill on your cardiac medications before your next appointment, please call your pharmacy*   Lab Work: NONE ordered at this time of appointment   If you have labs (blood work) drawn today and your tests are completely normal, you will receive your results only by: [place] (if you have MyChart) OR A paper copy in the mail If you have any lab test that is abnormal or we need to change your treatment, we will call you to review the results.   Testing/Procedures: Your physician has requested that you have an echocardiogram. Echocardiography is a painless test that uses sound waves to create images of your heart. It provides your doctor with information about the size and shape of your heart and how well your heart's chambers and valves are working. This procedure takes approximately one hour. There are no restrictions for this procedure.  Please schedule for October 2022   Follow-Up: At [facility], you and your health needs are our priority.  As part of our continuing mission to provide you with exceptional heart care, we have created designated Provider Care Teams.  These Care Teams include your primary Cardiologist (physician) and Advanced Practice Providers (APPs -  Physician Assistants and Nurse Practitioners) who all work together to provide you with the care you need, when you need it.   Your next appointment:   After Echo in October    The format for your next appointment:   In Person  Provider:   Minus Breeding, MD  Other Instructions

## 2021-01-28 ENCOUNTER — Other Ambulatory Visit: Payer: Self-pay

## 2021-01-28 ENCOUNTER — Telehealth (HOSPITAL_BASED_OUTPATIENT_CLINIC_OR_DEPARTMENT_OTHER): Payer: Self-pay | Admitting: Cardiology

## 2021-01-28 DIAGNOSIS — I701 Atherosclerosis of renal artery: Secondary | ICD-10-CM

## 2021-01-28 DIAGNOSIS — Z20822 Contact with and (suspected) exposure to covid-19: Secondary | ICD-10-CM | POA: Diagnosis not present

## 2021-01-28 NOTE — Telephone Encounter (Signed)
Spoke with patient regarding the 02/24/21 1:00pm appointment at [facility]----arrival time is 12:45 pm for check in ----testing will begin at 1 and patient will see Dr. Trula Slade at 2:20pm---patient voiced his understanding.

## 2021-02-12 ENCOUNTER — Other Ambulatory Visit: Payer: Self-pay

## 2021-02-12 ENCOUNTER — Encounter: Payer: Self-pay | Admitting: Family Medicine

## 2021-02-12 ENCOUNTER — Ambulatory Visit (INDEPENDENT_AMBULATORY_CARE_PROVIDER_SITE_OTHER): Payer: Medicare Other | Admitting: Family Medicine

## 2021-02-12 VITALS — BP 172/68 | HR 58 | Ht 70.0 in | Wt 177.0 lb

## 2021-02-12 DIAGNOSIS — R5383 Other fatigue: Secondary | ICD-10-CM | POA: Diagnosis not present

## 2021-02-12 DIAGNOSIS — I701 Atherosclerosis of renal artery: Secondary | ICD-10-CM | POA: Diagnosis not present

## 2021-02-12 DIAGNOSIS — I159 Secondary hypertension, unspecified: Secondary | ICD-10-CM

## 2021-02-12 DIAGNOSIS — N1832 Chronic kidney disease, stage 3b: Secondary | ICD-10-CM | POA: Diagnosis not present

## 2021-02-12 DIAGNOSIS — E785 Hyperlipidemia, unspecified: Secondary | ICD-10-CM

## 2021-02-12 MED ORDER — DONEPEZIL HCL 5 MG PO TABS: 5.0000 mg | ORAL_TABLET | Freq: Every day | ORAL | 1 refills | Status: DC

## 2021-02-12 NOTE — Progress Notes (Signed)
BP (!) 172/68   Pulse (!) 58   Ht 5' 10"  (1.778 m)   Wt 177 lb (80.3 kg)   SpO2 98%   BMI 25.40 kg/m    Subjective:   Patient ID: James [name], male    DOB: 1945-11-28, 75 y.o.   MRN: [id number]  HPI: James [name] is a 75 y.o. male presenting on 02/12/2021 for Medical Management of Chronic Issues and Fatigue   HPI Hypertension Patient is currently on amlodipine and clonidine and doxazosin and furosemide, and their blood pressure today is 172/68. Patient denies any lightheadedness or dizziness. Patient denies headaches, blurred vision, chest pains, shortness of breath, or weakness. Denies any side effects from medication and is content with current medication.   Hyperlipidemia Patient is coming in for recheck of his hyperlipidemia. The patient is currently taking atorvastatin. They deny any issues with myalgias or history of liver damage from it. They deny any focal numbness or weakness or chest pain.   CKD stage III Patient is coming in for recheck of CKD stage III.  He does have very elevated blood pressure which is affecting this and renal artery stenosis for which she has failed stenting and continues to see urology.  Patient is having increased memory issues and then gradual but really have noticed that over the past year.  His son is here with him who is explaining this.  He also says that there has been a lot more fatigue and decreased energy.  He does still have his CPAP but maybe is not wearing it all of the time and has not been back for over a couple years to be evaluated for it.  Relevant past medical, surgical, family and social history reviewed and updated as indicated. Interim medical history since our last visit reviewed. Allergies and medications reviewed and updated.  Review of Systems  Constitutional:  Positive for fatigue. Negative for chills and fever.  Respiratory:  Negative for shortness of breath and wheezing.   Cardiovascular:  Negative for  chest pain and leg swelling.  Musculoskeletal:  Negative for back pain and gait problem.  Skin:  Negative for rash.  Neurological:  Negative for dizziness and light-headedness.  Psychiatric/Behavioral:  Positive for sleep disturbance. Negative for confusion and dysphoric mood. The patient is not nervous/anxious.   All other systems reviewed and are negative.  Per HPI unless specifically indicated above   Allergies as of 02/12/2021       Reactions   Plavix [clopidogrel Bisulfate] Other (See Comments)   GI Bleed   Uloric [febuxostat]         Medication List        Accurate as of February 12, 2021 10:57 AM. If you have any questions, ask your nurse or doctor.          allopurinol 100 MG tablet Commonly known as: ZYLOPRIM TAKE 2 TABLETS DAILY   amLODipine-atorvastatin 10-40 MG tablet Commonly known as: CADUET TAKE 1 TABLET DAILY   aspirin 81 MG tablet Take 81 mg by mouth 2 (two) times daily with a meal.   cloNIDine HCl 0.1 MG Tb12 ER tablet Commonly known as: KAPVAY Take 0.1 mg by mouth 2 (two) times daily.   donepezil 5 MG tablet Commonly known as: ARICEPT Take 1 tablet (5 mg total) by mouth at bedtime. Started by: Worthy Rancher, MD   doxazosin 2 MG tablet Commonly known as: CARDURA Take 2 mg by mouth 2 (two) times daily.   ferrous sulfate 325 (  65 FE) MG tablet Take 325 mg by mouth in the morning and at bedtime.   fluticasone 50 MCG/ACT nasal spray Commonly known as: FLONASE Place 1 spray into both nostrils daily.   furosemide 40 MG tablet Commonly known as: LASIX 40 mg. Take two tablets by mouth in the morning and one in the evening   potassium chloride 10 MEQ CR capsule Commonly known as: MICRO-K Take 10 mEq by mouth daily. TAKE 1 TABLET DAILY   Vitamin D3 50 MCG (2000 UT) Tabs Take by mouth.         Objective:   BP (!) 172/68   Pulse (!) 58   Ht 5' 10"  (1.778 m)   Wt 177 lb (80.3 kg)   SpO2 98%   BMI 25.40 kg/m   Wt Readings from  Last 3 Encounters:  02/12/21 177 lb (80.3 kg)  01/24/21 177 lb 9.6 oz (80.6 kg)  11/21/20 175 lb 3.2 oz (79.5 kg)    Physical Exam Vitals and nursing note reviewed.  Constitutional:      General: He is not in acute distress.    Appearance: He is well-developed. He is not diaphoretic.  Eyes:     General: No scleral icterus.    Conjunctiva/sclera: Conjunctivae normal.  Neck:     Thyroid: No thyromegaly.  Cardiovascular:     Rate and Rhythm: Normal rate and regular rhythm.     Heart sounds: Normal heart sounds. No murmur heard. Pulmonary:     Effort: Pulmonary effort is normal. No respiratory distress.     Breath sounds: Normal breath sounds. No wheezing.  Musculoskeletal:     Cervical back: Neck supple.  Lymphadenopathy:     Cervical: No cervical adenopathy.  Skin:    General: Skin is warm and dry.     Findings: No rash.  Neurological:     Mental Status: He is alert and oriented to person, place, and time.     Coordination: Coordination normal.  Psychiatric:        Behavior: Behavior normal.      Assessment & Plan:   Problem List Items Addressed This Visit       Cardiovascular and Mediastinum   Renal artery stenosis (HCC)   Accelerated secondary hypertension - Primary   Relevant Orders   TSH     Genitourinary   Chronic kidney disease (CKD), stage III (moderate) (HCC)   Relevant Orders   CBC with Differential/Platelet   CMP14+EGFR     Other   Hyperlipidemia LDL goal <100   Relevant Orders   Lipid panel   Fatigue   Relevant Orders   CBC with Differential/Platelet   CMP14+EGFR   TSH    Patient has renal artery stenosis and they are looking to possibly see if they can stent again, and has not been able to be controlled with medicines.  He sees nephrology and cardiology Follow up plan: Return in about 3 months (around 05/15/2021), or if symptoms worsen or fail to improve, for  hypertension and CKD.  Counseling provided for all of the vaccine  components Orders Placed This Encounter  Procedures   CBC with Differential/Platelet   CMP14+EGFR   Lipid panel   TSH    Caryl Pina, MD [facility] 02/12/2021, 10:57 AM

## 2021-02-13 LAB — LIPID PANEL
Chol/HDL Ratio: 2.2 ratio (ref 0.0–5.0)
Cholesterol, Total: 126 mg/dL (ref 100–199)
HDL: 57 mg/dL (ref >39)
LDL Chol Calc (NIH): 50 mg/dL (ref 0–99)
Triglycerides: 101 mg/dL (ref 0–149)
VLDL Cholesterol Cal: 19 mg/dL (ref 5–40)

## 2021-02-13 LAB — CMP14+EGFR
ALT: 13 IU/L (ref 0–44)
AST: 18 IU/L (ref 0–40)
Albumin/Globulin Ratio: 1.8 (ref 1.2–2.2)
Albumin: 4.5 g/dL (ref 3.7–4.7)
Alkaline Phosphatase: 177 IU/L — ABNORMAL HIGH (ref 44–121)
BUN/Creatinine Ratio: 15 (ref 10–24)
BUN: 33 mg/dL — ABNORMAL HIGH (ref 8–27)
Bilirubin Total: 0.6 mg/dL (ref 0.0–1.2)
CO2: 27 mmol/L (ref 20–29)
Calcium: 9.3 mg/dL (ref 8.6–10.2)
Chloride: 96 mmol/L (ref 96–106)
Creatinine, Ser: 2.27 mg/dL — ABNORMAL HIGH (ref 0.76–1.27)
Globulin, Total: 2.5 g/dL (ref 1.5–4.5)
Glucose: 87 mg/dL (ref 65–99)
Potassium: 4.2 mmol/L (ref 3.5–5.2)
Sodium: 140 mmol/L (ref 134–144)
Total Protein: 7 g/dL (ref 6.0–8.5)
eGFR: 29 mL/min/{1.73_m2} — ABNORMAL LOW (ref >59)

## 2021-02-13 LAB — CBC WITH DIFFERENTIAL/PLATELET
Basophils Absolute: 0 10*3/uL (ref 0.0–0.2)
Basos: 1 %
EOS (ABSOLUTE): 0.1 10*3/uL (ref 0.0–0.4)
Eos: 2 %
Hematocrit: 37.9 % (ref 37.5–51.0)
Hemoglobin: 12.5 g/dL — ABNORMAL LOW (ref 13.0–17.7)
Immature Grans (Abs): 0 10*3/uL (ref 0.0–0.1)
Immature Granulocytes: 0 %
Lymphocytes Absolute: 1 10*3/uL (ref 0.7–3.1)
Lymphs: 13 %
MCH: 27.9 pg (ref 26.6–33.0)
MCHC: 33 g/dL (ref 31.5–35.7)
MCV: 85 fL (ref 79–97)
Monocytes Absolute: 0.6 10*3/uL (ref 0.1–0.9)
Monocytes: 7 %
Neutrophils Absolute: 6.4 10*3/uL (ref 1.4–7.0)
Neutrophils: 77 %
Platelets: 252 10*3/uL (ref 150–450)
RBC: 4.48 x10E6/uL (ref 4.14–5.80)
RDW: 14.7 % (ref 11.6–15.4)
WBC: 8.1 10*3/uL (ref 3.4–10.8)

## 2021-02-13 LAB — TSH: TSH: 1.42 u[IU]/mL (ref 0.450–4.500)

## 2021-02-24 ENCOUNTER — Ambulatory Visit (INDEPENDENT_AMBULATORY_CARE_PROVIDER_SITE_OTHER): Payer: Medicare Other | Admitting: Surgery

## 2021-02-24 ENCOUNTER — Ambulatory Visit (HOSPITAL_COMMUNITY)
Admission: RE | Admit: 2021-02-24 | Discharge: 2021-02-24 | Disposition: A | Discharge status: Home / Self Care | Payer: Medicare Other | Source: Ambulatory Visit | Attending: Surgery | Admitting: Surgery

## 2021-02-24 ENCOUNTER — Other Ambulatory Visit: Payer: Self-pay

## 2021-02-24 ENCOUNTER — Encounter: Payer: Self-pay | Admitting: Surgery

## 2021-02-24 ENCOUNTER — Encounter (HOSPITAL_COMMUNITY): Payer: Self-pay

## 2021-02-24 VITALS — BP 197/79 | HR 88 | Temp 98.1°F | Resp 20 | Ht 70.0 in | Wt 174.0 lb

## 2021-02-24 DIAGNOSIS — I701 Atherosclerosis of renal artery: Secondary | ICD-10-CM | POA: Diagnosis not present

## 2021-02-24 NOTE — Progress Notes (Signed)
[facility] [place]  Patient name: James [name] MRN: [id number] DOB: 12-Jul-1946 Sex: male   REASON FOR VISIT:    Follow up  HISOTRY OF PRESENT ILLNESS:   James [name] is a 75 y.o. male returns today for follow-up.  He is status post left carotid endarterectomy for asymptomatic stenosis in November 2013.  He is status post right carotid endarterectomy in 2008.    In 2009 he underwent a right renal artery stenting secondary to hypertension and an elevated creatinine.  In 2017 he was found to have progressive stenosis within his right renal artery stent and on 12/03/2015 he underwent renal angiography that showed that his right stent was occluded.  His kidney was perfused via an accessory artery.  His creatinine has remained stable.    He denies any new neurologic symptoms such as amaurosis fugax, or numbness or weakness.  His blood pressure has become progressively difficult to control.  His renal function has deteriorated but remained stable.   PAST MEDICAL HISTORY:   Past Medical History:  Diagnosis Date   Arthritis    Carotid artery occlusion    DVT (deep venous thrombosis) ([place]) 2008   GERD (gastroesophageal reflux disease)    takes Protonix daily   Gout    takes Allopurinol daily and Indomethacin prn   H/O renal artery stenosis    H/O: GI bleed    History of blood transfusion    History of colon polyps    History of gastric ulcer    Hyperlipidemia    takes Lipitor daily   Hypertension    takes Metoprolol and Amlodipine daily   Joint pain    Joint swelling      FAMILY HISTORY:   Family History  Problem Relation Age of Onset   Hypertension Mother    Hyperlipidemia Mother    Other Mother        varicose veins   Hyperlipidemia Father    Hypertension Father    Diabetes Father    Clotting disorder Father    Deep vein thrombosis Father    Hypertension Sister    Diabetes Sister     Hyperlipidemia Sister    Varicose Veins Sister    Other Sister        Bleeding problems   Hypertension Brother    Hyperlipidemia Brother     SOCIAL HISTORY:   Social History   Tobacco Use   Smoking status: Former    Packs/day: 1.50    Years: 36.00    Pack years: 54.00    Types: Cigarettes    Quit date: 12/26/1999    Years since quitting: 21.1   Smokeless tobacco: Former    Types: Chew    Quit date: 12/26/1999  Substance Use Topics   Alcohol use: No    Comment: Quit in 1998     ALLERGIES:   Allergies  Allergen Reactions   Plavix [Clopidogrel Bisulfate] Other (See Comments)    GI Bleed   Uloric [Febuxostat]      CURRENT MEDICATIONS:   Current Outpatient Medications  Medication Sig Dispense Refill   allopurinol (ZYLOPRIM) 100 MG tablet TAKE 2 TABLETS DAILY 180 tablet 3   amLODipine-atorvastatin (CADUET) 10-40 MG tablet TAKE 1 TABLET DAILY 90 tablet 3   aspirin 81 MG tablet Take 81 mg by mouth 2 (two) times daily with a meal.      Cholecalciferol (VITAMIN D3) 50 MCG (2000 UT) TABS Take by mouth.     cloNIDine  HCl (KAPVAY) 0.1 MG TB12 ER tablet Take 0.1 mg by mouth 2 (two) times daily.     donepezil (ARICEPT) 5 MG tablet Take 1 tablet (5 mg total) by mouth at bedtime. 90 tablet 1   doxazosin (CARDURA) 2 MG tablet Take 2 mg by mouth 2 (two) times daily.     ferrous sulfate 325 (65 FE) MG tablet Take 325 mg by mouth in the morning and at bedtime.     fluticasone (FLONASE) 50 MCG/ACT nasal spray Place 1 spray into both nostrils daily. 16 g 2   furosemide (LASIX) 40 MG tablet 40 mg. Take two tablets by mouth in the morning and one in the evening     potassium chloride (MICRO-K) 10 MEQ CR capsule Take 10 mEq by mouth daily. TAKE 1 TABLET DAILY     No current facility-administered medications for this visit.    REVIEW OF SYSTEMS:   [X]  denotes positive finding, [ ]  denotes negative finding Cardiac  Comments:  Chest pain or chest pressure:    Shortness of breath upon  exertion:    Short of breath when lying flat:    Irregular heart rhythm:        Vascular    Pain in calf, thigh, or hip brought on by ambulation:    Pain in feet at night that wakes you up from your sleep:     Blood clot in your veins:    Leg swelling:  x       Pulmonary    Oxygen at home:    Productive cough:     Wheezing:         Neurologic    Sudden weakness in arms or legs:     Sudden numbness in arms or legs:     Sudden onset of difficulty speaking or slurred speech:    Temporary loss of vision in one eye:     Problems with dizziness:         Gastrointestinal    Blood in stool:     Vomited blood:         Genitourinary    Burning when urinating:     Blood in urine:        Psychiatric    Major depression:         Hematologic    Bleeding problems:    Problems with blood clotting too easily:        Skin    Rashes or ulcers:        Constitutional    Fever or chills:      PHYSICAL EXAM:   There were no vitals filed for this visit.  GENERAL: The patient is a well-nourished male, in no acute distress. The vital signs are documented above. CARDIAC: There is a regular rate and rhythm.  PULMONARY: Non-labored respirations MUSCULOSKELETAL: There are no major deformities or cyanosis. NEUROLOGIC: No focal weakness or paresthesias are detected. SKIN: There are no ulcers or rashes noted. PSYCHIATRIC: The patient has a normal affect.  STUDIES:   Renal duplex could not be performed secondary to intra-abdominal gas and poor visualization  MEDICAL ISSUES:   Hypertension: The patient has a known occlusion of his right renal artery stent.  The right kidney is receiving blood flow via a accessory renal.  No percutaneous option remains available to improve blood flow to the right kidney.  I would be reluctant to offer him a renal artery bypass.  I do not think that this would achieve the  desired results.  I would like to see if he has developed any stenosis within the left  renal artery that potentially could be contributing to his issues.  He is going to try Gas-X to see if we can minimize his intra-abdominal gas and get better imaging.  I will get this done within the next week.  He will follow-up in the [facility].  If there appears to be a stenosis within the left renal artery greater than 60%, he will need to be scheduled for angiography.  Carotid: The patient has not had a carotid duplex within the past year.  Therefore when he returns, I will get this.    Leia Alf, MD, FACS [facility] [phone] Pager [phone]

## 2021-02-25 ENCOUNTER — Other Ambulatory Visit: Payer: Self-pay

## 2021-02-25 DIAGNOSIS — I6523 Occlusion and stenosis of bilateral carotid arteries: Secondary | ICD-10-CM

## 2021-02-25 DIAGNOSIS — I701 Atherosclerosis of renal artery: Secondary | ICD-10-CM

## 2021-03-07 ENCOUNTER — Other Ambulatory Visit: Payer: Self-pay

## 2021-03-07 ENCOUNTER — Ambulatory Visit (HOSPITAL_COMMUNITY)
Admission: RE | Admit: 2021-03-07 | Discharge: 2021-03-07 | Disposition: A | Discharge status: Home / Self Care | Payer: Medicare Other | Source: Ambulatory Visit | Attending: Surgery | Admitting: Surgery

## 2021-03-07 ENCOUNTER — Ambulatory Visit (INDEPENDENT_AMBULATORY_CARE_PROVIDER_SITE_OTHER)
Admission: RE | Admit: 2021-03-07 | Discharge: 2021-03-07 | Disposition: A | Discharge status: Home / Self Care | Payer: Medicare Other | Source: Ambulatory Visit | Attending: Surgery | Admitting: Surgery

## 2021-03-07 DIAGNOSIS — I701 Atherosclerosis of renal artery: Secondary | ICD-10-CM | POA: Diagnosis not present

## 2021-03-07 DIAGNOSIS — I6523 Occlusion and stenosis of bilateral carotid arteries: Secondary | ICD-10-CM

## 2021-03-10 ENCOUNTER — Ambulatory Visit (INDEPENDENT_AMBULATORY_CARE_PROVIDER_SITE_OTHER): Payer: Medicare Other | Admitting: Physician Assistant

## 2021-03-10 ENCOUNTER — Encounter: Payer: Self-pay | Admitting: Physician Assistant

## 2021-03-10 ENCOUNTER — Other Ambulatory Visit: Payer: Self-pay

## 2021-03-10 VITALS — BP 191/77 | HR 65 | Ht 70.0 in | Wt 172.7 lb

## 2021-03-10 DIAGNOSIS — I701 Atherosclerosis of renal artery: Secondary | ICD-10-CM | POA: Diagnosis not present

## 2021-03-10 DIAGNOSIS — I6523 Occlusion and stenosis of bilateral carotid arteries: Secondary | ICD-10-CM | POA: Diagnosis not present

## 2021-03-10 NOTE — Progress Notes (Signed)
Office Note     CC:  follow up Requesting Provider:  [name], [name], MD  HPI: James [name] is a 75 y.o. (03/14/1946) male who presents for evaluation of renal artery stenosis.  He underwent right renal artery stenting and 2009 due to hypertension and elevated creatinine.  In 2017 he was found to have in-stent stenosis and underwent angiography which demonstrated stent was actually occluded.  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  He is on a 4 drug antihypertensive regimen which is managed with a collaborative effort involving his primary care physician, nephrologist, and cardiologist.  He states his blood pressure readings taken at home average in the 794I systolic.  He is also being seen for carotid artery stenosis.  He has history of right carotid endarterectomy in 2008 as well as left carotid endarterectomy in 2013.  He denies any strokelike symptoms including slurring speech, changes in vision, or one-sided weakness. He is on aspirin and statin daily.  He is a former smoker.   Past Medical History:  Diagnosis Date   Arthritis    Carotid artery occlusion    DVT (deep venous thrombosis) ([place]) 2008   GERD (gastroesophageal reflux disease)    takes Protonix daily   Gout    takes Allopurinol daily and Indomethacin prn   H/O renal artery stenosis    H/O: GI bleed    History of blood transfusion    History of colon polyps    History of gastric ulcer    Hyperlipidemia    takes Lipitor daily   Hypertension    takes Metoprolol and Amlodipine daily   Joint pain    Joint swelling     Past Surgical History:  Procedure Laterality Date   CAROTID ENDARTERECTOMY  2008   right CEA   CHOLECYSTECTOMY  mid 90's   COLON SURGERY     COLONOSCOPY     ENDARTERECTOMY  06/02/2012   Procedure: ENDARTERECTOMY CAROTID;  Surgeon: Serafina Mitchell, MD;  Location: [place];  Service: Vascular;  Laterality: Left;   ESOPHAGOGASTRODUODENOSCOPY     HERNIA REPAIR      umbilical hernia   PATCH ANGIOPLASTY  06/02/2012   Procedure: PATCH ANGIOPLASTY;  Surgeon: Serafina Mitchell, MD;  Location: [place];  Service: Vascular;  Laterality: Left;  using Vascu-Guard Patch   PERIPHERAL VASCULAR CATHETERIZATION Right 12/03/2015   Procedure: Peripheral Vascular Balloon Angioplasty;  Surgeon: Serafina Mitchell, MD;  Location: [place] [facility];  Service: Cardiovascular;  Laterality: Right;  RENAL unsuccessful   RENAL ARTERY STENT  2009   Right renal artery stenting-  Right Kidney   small intestine removed  2001   d/t random gi bleeding    Social History   Socioeconomic History   Marital status: Married    Spouse name: Melrose   Number of children: 2   Years of education: 14   Highest education level: Associate degree: occupational, Hotel manager, or vocational program  Occupational History   Occupation: Retired  Tobacco Use   Smoking status: Former    Packs/day: 1.50    Years: 36.00    Pack years: 54.00    Types: Cigarettes    Quit date: 12/26/1999    Years since quitting: 21.2   Smokeless tobacco: Former    Types: Chew    Quit date: 12/26/1999  Vaping Use   Vaping Use: Never used  Substance and Sexual Activity   Alcohol use: No    Comment: Quit  in 1998   Drug use: No   Sexual activity: Yes    Birth control/protection: None  Other Topics Concern   Not on file  Social History Narrative   Lives with wife.     Social Determinants of Health   Financial Resource Strain: Not on file  Food Insecurity: Not on file  Transportation Needs: Not on file  Physical Activity: Not on file  Stress: Not on file  Social Connections: Not on file  Intimate Partner Violence: Not on file    Family History  Problem Relation Age of Onset   Hypertension Mother    Hyperlipidemia Mother    Other Mother        varicose veins   Hyperlipidemia Father    Hypertension Father    Diabetes Father    Clotting disorder Father    Deep vein thrombosis Father    Hypertension  Sister    Diabetes Sister    Hyperlipidemia Sister    Varicose Veins Sister    Other Sister        Bleeding problems   Hypertension Brother    Hyperlipidemia Brother     Current Outpatient Medications  Medication Sig Dispense Refill   allopurinol (ZYLOPRIM) 100 MG tablet TAKE 2 TABLETS DAILY 180 tablet 3   amLODipine-atorvastatin (CADUET) 10-40 MG tablet TAKE 1 TABLET DAILY 90 tablet 3   aspirin 81 MG tablet Take 81 mg by mouth daily. Once daily     Cholecalciferol (VITAMIN D3) 50 MCG (2000 UT) TABS Take by mouth.     cloNIDine HCl (KAPVAY) 0.1 MG TB12 ER tablet Take 0.1 mg by mouth 2 (two) times daily.     donepezil (ARICEPT) 5 MG tablet Take 1 tablet (5 mg total) by mouth at bedtime. 90 tablet 1   doxazosin (CARDURA) 2 MG tablet Take 2 mg by mouth 2 (two) times daily.     ferrous sulfate 325 (65 FE) MG tablet Take 325 mg by mouth in the morning and at bedtime.     fluticasone (FLONASE) 50 MCG/ACT nasal spray Place 1 spray into both nostrils daily. 16 g 2   furosemide (LASIX) 40 MG tablet 40 mg. Take two tablets by mouth in the morning and one in the evening     potassium chloride (MICRO-K) 10 MEQ CR capsule Take 10 mEq by mouth daily. TAKE 1 TABLET DAILY     No current facility-administered medications for this visit.    Allergies  Allergen Reactions   Plavix [Clopidogrel Bisulfate] Other (See Comments)    GI Bleed   Uloric [Febuxostat]      REVIEW OF SYSTEMS:   [X]  denotes positive finding, [ ]  denotes negative finding Cardiac  Comments:  Chest pain or chest pressure:    Shortness of breath upon exertion:    Short of breath when lying flat:    Irregular heart rhythm:        Vascular    Pain in calf, thigh, or hip brought on by ambulation:    Pain in feet at night that wakes you up from your sleep:     Blood clot in your veins:    Leg swelling:         Pulmonary    Oxygen at home:    Productive cough:     Wheezing:         Neurologic    Sudden weakness in  arms or legs:     Sudden numbness in arms or legs:  Sudden onset of difficulty speaking or slurred speech:    Temporary loss of vision in one eye:     Problems with dizziness:         Gastrointestinal    Blood in stool:     Vomited blood:         Genitourinary    Burning when urinating:     Blood in urine:        Psychiatric    Major depression:         Hematologic    Bleeding problems:    Problems with blood clotting too easily:        Skin    Rashes or ulcers:        Constitutional    Fever or chills:      PHYSICAL EXAMINATION:  Vitals:   03/10/21 1415 03/10/21 1419  BP: (!) 197/81 (!) 191/77  Pulse: 65   SpO2: 97%   Weight: 172 lb 11.2 oz (78.3 kg)   Height: 5\' 10"  (1.778 m)     General:  WDWN in NAD; vital signs documented above Gait: Not observed HENT: WNL, normocephalic Pulmonary: normal non-labored breathing , without Rales, rhonchi,  wheezing Cardiac: regular HR Abdomen: soft, NT, no masses Skin: without rashes Vascular Exam/Pulses:  Right Left  Radial 2+ (normal) 2+ (normal)   Extremities: without ischemic changes, without Gangrene , without cellulitis; without open wounds;  Musculoskeletal: no muscle wasting or atrophy  Neurologic: A&O X 3;  No focal weakness or paresthesias are detected Psychiatric:  The pt has Normal affect.   Non-Invasive Vascular Imaging:   Left renal artery widely patent  Bilateral ICA stenosis 1 to 39%    ASSESSMENT/PLAN:: 75 y.o. male here for evaluation of renal and carotid artery stenosis  -Patient has a known occluded right renal artery stent.  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  Per Dr. Trula Slade he does not have any percutaneous options remaining pertaining to the right kidney and would be reluctant to offer him a renal artery bypass.  Patient does however have a widely patent left renal artery without any hemodynamically significant stenosis.  With that being said there is  nothing further to add from a vascular surgery standpoint in regards to perfusion to the kidneys.  We will continue to monitor patency of left renal artery and will offer angiography if stenosis is estimated to be greater than 60% in the future.  Patient would also like [place] to monitor the size of his right kidney with the assumption and is still being perfused by an accessory branch.  We will repeat renal artery duplex in 6 months time.  -He is also followed for carotid artery stenosis with history of carotid endarterectomy on the left in 2008 and on the right in 2013.  He has no evidence of recurrent stenosis on duplex.  This can be repeated in another 2 years.    Dagoberto Ligas, PA-C [facility] [phone]  Clinic MD:   Trula Slade

## 2021-03-12 ENCOUNTER — Other Ambulatory Visit: Payer: Self-pay

## 2021-03-12 DIAGNOSIS — I701 Atherosclerosis of renal artery: Secondary | ICD-10-CM

## 2021-03-26 DIAGNOSIS — H52203 Unspecified astigmatism, bilateral: Secondary | ICD-10-CM | POA: Diagnosis not present

## 2021-03-26 DIAGNOSIS — H2513 Age-related nuclear cataract, bilateral: Secondary | ICD-10-CM | POA: Diagnosis not present

## 2021-04-23 DIAGNOSIS — N184 Chronic kidney disease, stage 4 (severe): Secondary | ICD-10-CM | POA: Diagnosis not present

## 2021-04-29 DIAGNOSIS — R600 Localized edema: Secondary | ICD-10-CM | POA: Diagnosis not present

## 2021-04-29 DIAGNOSIS — N281 Cyst of kidney, acquired: Secondary | ICD-10-CM | POA: Diagnosis not present

## 2021-04-29 DIAGNOSIS — E876 Hypokalemia: Secondary | ICD-10-CM | POA: Diagnosis not present

## 2021-04-29 DIAGNOSIS — N184 Chronic kidney disease, stage 4 (severe): Secondary | ICD-10-CM | POA: Diagnosis not present

## 2021-04-29 DIAGNOSIS — I3139 Other pericardial effusion (noninflammatory): Secondary | ICD-10-CM | POA: Diagnosis not present

## 2021-04-29 DIAGNOSIS — M109 Gout, unspecified: Secondary | ICD-10-CM | POA: Diagnosis not present

## 2021-04-29 DIAGNOSIS — D631 Anemia in chronic kidney disease: Secondary | ICD-10-CM | POA: Diagnosis not present

## 2021-04-29 DIAGNOSIS — I129 Hypertensive chronic kidney disease with stage 1 through stage 4 chronic kidney disease, or unspecified chronic kidney disease: Secondary | ICD-10-CM | POA: Diagnosis not present

## 2021-04-29 DIAGNOSIS — N62 Hypertrophy of breast: Secondary | ICD-10-CM | POA: Diagnosis not present

## 2021-04-29 DIAGNOSIS — N2581 Secondary hyperparathyroidism of renal origin: Secondary | ICD-10-CM | POA: Diagnosis not present

## 2021-04-29 DIAGNOSIS — I701 Atherosclerosis of renal artery: Secondary | ICD-10-CM | POA: Diagnosis not present

## 2021-04-30 ENCOUNTER — Ambulatory Visit (HOSPITAL_COMMUNITY)
Admission: RE | Admit: 2021-04-30 | Discharge: 2021-04-30 | Disposition: A | Discharge status: Home / Self Care | Payer: Medicare Other | Source: Ambulatory Visit | Attending: Cardiology | Admitting: Cardiology

## 2021-04-30 ENCOUNTER — Other Ambulatory Visit: Payer: Self-pay

## 2021-04-30 DIAGNOSIS — I3139 Other pericardial effusion (noninflammatory): Secondary | ICD-10-CM | POA: Diagnosis not present

## 2021-04-30 LAB — ECHOCARDIOGRAM COMPLETE
Area-P 1/2: 3.31 cm2
S' Lateral: 3.5 cm

## 2021-04-30 NOTE — Progress Notes (Signed)
*  PRELIMINARY RESULTS* Echocardiogram 2D Echocardiogram has been performed.  James [name] 04/30/2021, 11:21 AM

## 2021-05-02 ENCOUNTER — Ambulatory Visit (INDEPENDENT_AMBULATORY_CARE_PROVIDER_SITE_OTHER): Payer: Medicare Other | Admitting: Pulmonary Disease

## 2021-05-02 ENCOUNTER — Other Ambulatory Visit: Payer: Self-pay

## 2021-05-02 ENCOUNTER — Encounter: Payer: Self-pay | Admitting: Pulmonary Disease

## 2021-05-02 VITALS — BP 140/84 | HR 68 | Temp 98.1°F | Ht 70.0 in | Wt 170.2 lb

## 2021-05-02 DIAGNOSIS — I701 Atherosclerosis of renal artery: Secondary | ICD-10-CM | POA: Diagnosis not present

## 2021-05-02 DIAGNOSIS — Z9989 Dependence on other enabling machines and devices: Secondary | ICD-10-CM

## 2021-05-02 DIAGNOSIS — G4733 Obstructive sleep apnea (adult) (pediatric): Secondary | ICD-10-CM | POA: Diagnosis not present

## 2021-05-02 DIAGNOSIS — G4719 Other hypersomnia: Secondary | ICD-10-CM | POA: Diagnosis not present

## 2021-05-02 NOTE — Progress Notes (Signed)
[facility]  Chief Complaint  Patient presents with   Follow-up    Uses Cpap every night per patient. Still tired and stays sleepy all of the time per patient. Has issues with mask irritating top of nose. PCP wants pt to be seen for fatigue.     Past Surgical History:  He  has a past surgical history that includes Renal artery stent (2009); Carotid endarterectomy (2008); small intestine removed (2001); Esophagogastroduodenoscopy; Cholecystectomy (mid 90's); Colonoscopy; Hernia repair; Endarterectomy (06/02/2012); Patch angioplasty (06/02/2012); Colon surgery; and Cardiac catheterization (Right, 12/03/2015).  Past Medical History:  HTN, HLD, PUD, Colon polyp, Renal artery stenosis s/p stent, Gout, GERD, DVT 2008, OA, PAD s/p CEA  Constitutional:  BP 140/84 (BP Location: Left Arm, Patient Position: Sitting)   Pulse 68   Temp 98.1 F (36.7 C) (Temporal)   Ht 5\' 10"  (1.778 m)   Wt 170 lb 3.2 oz (77.2 kg)   SpO2 97%   BMI 24.42 kg/m   Brief Summary:  James [name] is a 75 y.o. male former smoker with obstructive sleep apnea.      Subjective:   He is here with his son.  He doesn't feel like he has energy during the day.  He knows he needs to do things, but opts not to.  He doesn't feel like he is depressed.  He feels cold all the time.  He can fall asleep for 30 minutes to an hour if he is sitting quiet.  He tried sleeping without his CPAP and his family noticed his attitude was much worse.  He started using CPAP again, and his attitude improved but he was still feeling low energy level.  TSH from July 2022 was normal.  He uses full face mask.  Hasn't tried any other mask type in couple of years.  He gets irritation on the bridge and side of his nose.    Physical Exam:   Appearance - well kempt   ENMT - no sinus tenderness, no oral exudate, no LAN, Mallampati 4 airway, no stridor  Respiratory - equal breath sounds bilaterally, no  wheezing or rales  CV - s1s2 regular rate and rhythm, no murmurs  Ext - no clubbing, no edema  Skin - no rashes  Psych - normal mood and affect   Sleep Tests:  HST 01/14/16 >> AHI 25.9, SaO2 low 77% Auto CPAP 03/31/21 to 04/29/21 >> used on 29 of 30 nights with average 8 hrs 15 min.  Average AHI 7.5 with median CPAP 9 and 95 th percentile CPAP 14 cm H2O.  Air leak noted.  Cardiac Tests:  Echo 04/30/21 >> EF 55 to 60%, mod LVH, grade 1 DD, large pericardial effusion  Social History:  He  reports that he quit smoking about 21 years ago. His smoking use included cigarettes. He has a 54.00 pack-year smoking history. He quit smokeless tobacco use about 21 years ago.  His smokeless tobacco use included chew. He reports that he does not drink alcohol and does not use drugs.  Family History:  His family history includes Clotting disorder in his father; Deep vein thrombosis in his father; Diabetes in his father and sister; Hyperlipidemia in his brother, father, mother, and sister; Hypertension in his brother, father, mother, and sister; Other in his mother and sister; Varicose Veins in his sister.     Assessment/Plan:    Obstructive sleep apnea with excess daytime sleepiness. - he is compliant with CPAP and reports  benefit from therapy - he uses [employer] for his DME - will change his auto CPAP to 7 - 17 cm H2O and arrange for overnight oximetry on CPAP - depending on results, will determine if he needs to do an in lab titration study - discussed issues of using a SoClean device regarding ozone exposure - he will look up SleepWeaver cloth CPAP masks online and call if he would like to try one of these - he is on several medications that could contribute to daytime sleepiness (clonidine, cardura, aricept)  Pericardial effusion. - followed by Dr. Marijo File with [facility]  Renal artery stenosis. - followed by Dr. Harold Barban with vascular surgery  CKD 3b. - followed by Dr. Lawson Radar with nephrology  Time Spent Involved in Patient Care on Day of Examination:  35 minutes  Follow up:   Patient Instructions  Can look up SleepWeaver cloth CPAP masks by [employer]  Will change your auto CPAP setting to 7 - 17 cm water pressure and arrange for overnight oxygen test at home with CPAP  Your thyroid level was normal from February 12, 2021  Follow up in 3 to 4 months  Medication List:   Allergies as of 05/02/2021       Reactions   Plavix [clopidogrel Bisulfate] Other (See Comments)   GI Bleed   Uloric [febuxostat]         Medication List        Accurate as of May 02, 2021 10:43 AM. If you have any questions, ask your nurse or doctor.          allopurinol 100 MG tablet Commonly known as: ZYLOPRIM TAKE 2 TABLETS DAILY   amLODipine-atorvastatin 10-40 MG tablet Commonly known as: CADUET TAKE 1 TABLET DAILY   aspirin 81 MG tablet Take 81 mg by mouth daily. Once daily   cloNIDine HCl 0.1 MG Tb12 ER tablet Commonly known as: KAPVAY Take 0.1 mg by mouth 2 (two) times daily.   donepezil 5 MG tablet Commonly known as: ARICEPT Take 1 tablet (5 mg total) by mouth at bedtime.   doxazosin 2 MG tablet Commonly known as: CARDURA Take 2 mg by mouth 2 (two) times daily.   ferrous sulfate 325 (65 FE) MG tablet Take 325 mg by mouth in the morning and at bedtime.   fluticasone 50 MCG/ACT nasal spray Commonly known as: FLONASE Place 1 spray into both nostrils daily.   furosemide 40 MG tablet Commonly known as: LASIX 40 mg. Take two tablets by mouth in the morning and one in the evening   potassium chloride 10 MEQ CR capsule Commonly known as: MICRO-K Take 10 mEq by mouth daily. TAKE 1 TABLET DAILY   Vitamin D3 50 MCG (2000 UT) Tabs Take by mouth.        Signature:  Chesley Mires, MD [place] Pager - [phone] 05/02/2021, 10:43 AM

## 2021-05-02 NOTE — Patient Instructions (Addendum)
Can look up SleepWeaver cloth CPAP masks by [employer]  Will change your auto CPAP setting to 7 - 17 cm water pressure and arrange for overnight oxygen test at home with CPAP  Your thyroid level was normal from February 12, 2021  Follow up in 3 to 4 months

## 2021-05-11 NOTE — Progress Notes (Signed)
Cardiology Office Note   Date:  05/13/2021   ID:  James [name], DOB 1946/03/28, MRN [id number]  PCP:  [name], [name], MD  Cardiologist:   None Referring:  [name], [name], MD  Chief Complaint  Patient presents with   Pericardial Effusion           History of Present Illness: James [name] is a 75 y.o. male who presents for evaluation of fatigue and difficult to control hypertension.  At an appt recently I ordered an echo to evaluate an abnormal EKG.   This demonstrated a large pericardial effusion.  I sent him for follow echo and it was still large although it was possibly mild less than previous.   I discontinued hydralazine.  He had no evidence of tamponade.    Since I last saw him I repeated another echo earlier this month and his effusion is large.  However, he remains without overt symptoms related to this.  The chronic fatigue which has been his biggest issue is actually improved.  He says he is getting out and doing a lot of more around his house.  His blood pressures been in the 150s 160s at home.  This is improved compared to previous.  He is not having any new shortness of breath, PND or orthopnea.  He has not had any presyncope or syncope.  He denies any chest pain.   Past Medical History:  Diagnosis Date   Arthritis    Carotid artery occlusion    DVT (deep venous thrombosis) ([place]) 2008   GERD (gastroesophageal reflux disease)    takes Protonix daily   Gout    takes Allopurinol daily and Indomethacin prn   H/O renal artery stenosis    H/O: GI bleed    History of blood transfusion    History of colon polyps    History of gastric ulcer    Hyperlipidemia    takes Lipitor daily   Hypertension    takes Metoprolol and Amlodipine daily   Joint pain    Joint swelling     Past Surgical History:  Procedure Laterality Date   CAROTID ENDARTERECTOMY  2008   right CEA   CHOLECYSTECTOMY  mid 90's   COLON SURGERY     COLONOSCOPY      ENDARTERECTOMY  06/02/2012   Procedure: ENDARTERECTOMY CAROTID;  Surgeon: Serafina Mitchell, MD;  Location: [place] [place];  Service: Vascular;  Laterality: Left;   ESOPHAGOGASTRODUODENOSCOPY     HERNIA REPAIR     umbilical hernia   PATCH ANGIOPLASTY  06/02/2012   Procedure: PATCH ANGIOPLASTY;  Surgeon: Serafina Mitchell, MD;  Location: [facility];  Service: Vascular;  Laterality: Left;  using Vascu-Guard Patch   PERIPHERAL VASCULAR CATHETERIZATION Right 12/03/2015   Procedure: Peripheral Vascular Balloon Angioplasty;  Surgeon: Serafina Mitchell, MD;  Location: [facility];  Service: Cardiovascular;  Laterality: Right;  RENAL unsuccessful   RENAL ARTERY STENT  2009   Right renal artery stenting-  Right Kidney   small intestine removed  2001   d/t random gi bleeding     Current Outpatient Medications  Medication Sig Dispense Refill   allopurinol (ZYLOPRIM) 100 MG tablet TAKE 2 TABLETS DAILY 180 tablet 3   amLODipine-atorvastatin (CADUET) 10-40 MG tablet TAKE 1 TABLET DAILY 90 tablet 3   aspirin 81 MG tablet Take 81 mg by mouth daily. Once daily     Cholecalciferol (VITAMIN D3) 50 MCG (2000 UT) TABS Take by mouth.  cloNIDine HCl (KAPVAY) 0.1 MG TB12 ER tablet Take 0.1 mg by mouth 2 (two) times daily.     donepezil (ARICEPT) 5 MG tablet Take 1 tablet (5 mg total) by mouth at bedtime. 90 tablet 1   doxazosin (CARDURA) 2 MG tablet Take 2 mg by mouth 2 (two) times daily.     ferrous sulfate 325 (65 FE) MG tablet Take 325 mg by mouth in the morning and at bedtime.     fluticasone (FLONASE) 50 MCG/ACT nasal spray Place 1 spray into both nostrils daily. 16 g 2   furosemide (LASIX) 40 MG tablet 40 mg. Take two tablets by mouth in the morning and one in the evening     potassium chloride (MICRO-K) 10 MEQ CR capsule Take 10 mEq by mouth daily. TAKE 1 TABLET DAILY     No current facility-administered medications for this visit.    Allergies:   Plavix [clopidogrel bisulfate] and Uloric [febuxostat]    ROS:   Please see the history of present illness.   Otherwise, review of systems are for none .   All other systems are reviewed and negative.    PHYSICAL EXAM: VS:  BP (!) 160/70   Pulse 70   Ht 5\' 10"  (1.778 m)   Wt 173 lb 9.6 oz (78.7 kg)   SpO2 95%   BMI 24.91 kg/m  , BMI Body mass index is 24.91 kg/m. GENERAL:  Well appearing NECK:  No jugular venous distention, waveform within normal limits, carotid upstroke brisk and symmetric, no bruits, no thyromegaly LUNGS:  Clear to auscultation bilaterally CHEST:  Unremarkable HEART:  PMI not displaced or sustained,S1 and S2 within normal limits, no S3, no S4, no clicks, no rubs, no murmurs ABD:  Flat, positive bowel sounds normal in frequency in pitch, no bruits, no rebound, no guarding, no midline pulsatile mass, no hepatomegaly, no splenomegaly EXT:  2 plus pulses throughout, no edema, no cyanosis no clubbing  EKG:  EKG is ordered today. Sinus rhythm, rate 70, axis within normal limits, borderline low voltage in the limb leads, no acute ST-T wave changes.  Recent Labs: 02/12/2021: ALT 13; BUN 33; Creatinine, Ser 2.27; Hemoglobin 12.5; Platelets 252; Potassium 4.2; Sodium 140; TSH 1.420    Lipid Panel    Component Value Date/Time   CHOL 126 02/12/2021 1105   TRIG 101 02/12/2021 1105   HDL 57 02/12/2021 1105   CHOLHDL 2.2 02/12/2021 1105   LDLCALC 50 02/12/2021 1105      Wt Readings from Last 3 Encounters:  05/13/21 173 lb 9.6 oz (78.7 kg)  05/02/21 170 lb 3.2 oz (77.2 kg)  03/10/21 172 lb 11.2 oz (78.3 kg)      Other studies Reviewed: Additional studies/ records that were reviewed today include: Echo Review of the above records demonstrates:  NA   ASSESSMENT AND PLAN:  PERICARDIAL EFFUSION:     I had a long discussion with the patient and his son.  He would prefer conservative therapy.   At this point I do not think that he has an absolute indication for pericardiocentesis or window.  He would like to avoid this unless he is  having significant symptoms but he does promise to tell me should he get anything like increased shortness of breath, lightheadedness or pain.  He had his son both agree to this discussion.  FATIGUE:   I have not had a cardiac etiology for this.  This is improved.  Has had a thorough work-up.  No change  in therapy.   RAS:   He is followed by [facility] and had Doppler in August.  He did have follow-up with the [facility] and no further imaging or intervention is planned at this point.  I reviewed these records for this visit.Marland Kitchen    CAROTID STENOSIS:    This is followed by [facility].    HTN:    His blood pressure at home is much better controlled.  He is easily agitated and his blood pressure goes up in the office.  He has been very sensitive to multiple medications.  At this point no change in therapy.   CKD:    Creat is 2.3 which is improved compared to previous.  No change in therapy.    Current medicines are reviewed at length with the patient today.  The patient does not have concerns regarding medicines.  The following changes have been made:    None Labs/ tests ordered today include: None  Orders Placed This Encounter  Procedures   EKG 12-Lead   ECHOCARDIOGRAM COMPLETE     Disposition:   FU with me in April   Signed, Minus Breeding, MD  05/13/2021 5:31 PM    [place]

## 2021-05-13 ENCOUNTER — Other Ambulatory Visit: Payer: Self-pay

## 2021-05-13 ENCOUNTER — Encounter: Payer: Self-pay | Admitting: Cardiology

## 2021-05-13 ENCOUNTER — Ambulatory Visit (INDEPENDENT_AMBULATORY_CARE_PROVIDER_SITE_OTHER): Payer: Medicare Other | Admitting: Cardiology

## 2021-05-13 VITALS — BP 160/70 | HR 70 | Ht 70.0 in | Wt 173.6 lb

## 2021-05-13 DIAGNOSIS — I3139 Other pericardial effusion (noninflammatory): Secondary | ICD-10-CM | POA: Diagnosis not present

## 2021-05-13 DIAGNOSIS — I701 Atherosclerosis of renal artery: Secondary | ICD-10-CM

## 2021-05-13 NOTE — Patient Instructions (Signed)
Medication Instructions:  The current medical regimen is effective;  continue present plan and medications.  *If you need a refill on your cardiac medications before your next appointment, please call your pharmacy*   Testing/Procedures: Echocardiogram - Your physician has requested that you have an echocardiogram. Echocardiography is a painless test that uses sound waves to create images of your heart. It provides your doctor with information about the size and shape of your heart and how well your heart's chambers and valves are working. This procedure takes approximately one hour. There are no restrictions for this procedure. This will be performed at our [address] location - [address], [address].    Follow-Up: At [facility], you and your health needs are our priority.  As part of our continuing mission to provide you with exceptional heart care, we have created designated Provider Care Teams.  These Care Teams include your primary Cardiologist (physician) and Advanced Practice Providers (APPs -  Physician Assistants and Nurse Practitioners) who all work together to provide you with the care you need, when you need it.  We recommend signing up for the patient portal called "MyChart".  Sign up information is provided on this After Visit Summary.  MyChart is used to connect with patients for Virtual Visits (Telemedicine).  Patients are able to view lab/test results, encounter notes, upcoming appointments, etc.  Non-urgent messages can be sent to your provider as well.   To learn more about what you can do with MyChart, go to [email or web address].    Your next appointment:   6 month(s)  The format for your next appointment:   In Person  Provider:   James Hochrein, MD    

## 2021-05-14 DIAGNOSIS — R0683 Snoring: Secondary | ICD-10-CM | POA: Diagnosis not present

## 2021-05-14 DIAGNOSIS — G4730 Sleep apnea, unspecified: Secondary | ICD-10-CM | POA: Diagnosis not present

## 2021-05-16 ENCOUNTER — Encounter: Payer: Self-pay | Admitting: Family Medicine

## 2021-05-16 ENCOUNTER — Ambulatory Visit (INDEPENDENT_AMBULATORY_CARE_PROVIDER_SITE_OTHER): Payer: Medicare Other | Admitting: Family Medicine

## 2021-05-16 ENCOUNTER — Other Ambulatory Visit: Payer: Self-pay

## 2021-05-16 VITALS — BP 172/68 | HR 70 | Wt 172.0 lb

## 2021-05-16 DIAGNOSIS — I159 Secondary hypertension, unspecified: Secondary | ICD-10-CM | POA: Diagnosis not present

## 2021-05-16 DIAGNOSIS — N1832 Chronic kidney disease, stage 3b: Secondary | ICD-10-CM

## 2021-05-16 DIAGNOSIS — I701 Atherosclerosis of renal artery: Secondary | ICD-10-CM

## 2021-05-16 DIAGNOSIS — I10 Essential (primary) hypertension: Secondary | ICD-10-CM | POA: Diagnosis not present

## 2021-05-16 DIAGNOSIS — Z23 Encounter for immunization: Secondary | ICD-10-CM | POA: Diagnosis not present

## 2021-05-16 DIAGNOSIS — E785 Hyperlipidemia, unspecified: Secondary | ICD-10-CM

## 2021-05-16 NOTE — Progress Notes (Signed)
BP (!) 172/68   Pulse 70   Wt 172 lb (78 kg)   SpO2 95%   BMI 24.68 kg/m    Subjective:   Patient ID: James [name], male    DOB: Dec 07, 1945, 75 y.o.   MRN: [id number]  HPI: James [name] is a 75 y.o. male presenting on 05/16/2021 for Medical Management of Chronic Issues and Hypertension   HPI Hypertension Patient is currently on amlodipine and clonidine and doxazosin and furosemide, and their blood pressure today is 172/68. Patient denies any lightheadedness or dizziness. Patient denies headaches, blurred vision, chest pains, shortness of breath, or weakness. Denies any side effects from medication and is content with current medication.   Hyperlipidemia Patient is coming in for recheck of his hyperlipidemia. The patient is currently taking atorvastatin. They deny any issues with myalgias or history of liver damage from it. They deny any focal numbness or weakness or chest pain.   CKD stage III Patient has CKD stage III likely due to elevated blood pressure that has not been able to be controlled.  Sees nephrology and urology not significant to control his blood pressure.  Relevant past medical, surgical, family and social history reviewed and updated as indicated. Interim medical history since our last visit reviewed. Allergies and medications reviewed and updated.  Review of Systems  Constitutional:  Negative for chills and fever.  Respiratory:  Negative for shortness of breath and wheezing.   Cardiovascular:  Negative for chest pain and leg swelling.  Musculoskeletal:  Negative for back pain and gait problem.  Skin:  Negative for rash.  Neurological:  Negative for dizziness, weakness and numbness.  All other systems reviewed and are negative.  Per HPI unless specifically indicated above   Allergies as of 05/16/2021       Reactions   Plavix [clopidogrel Bisulfate] Other (See Comments)   GI Bleed   Uloric [febuxostat]         Medication List         Accurate as of May 16, 2021  4:17 PM. If you have any questions, ask your nurse or doctor.          allopurinol 100 MG tablet Commonly known as: ZYLOPRIM TAKE 2 TABLETS DAILY   amLODipine-atorvastatin 10-40 MG tablet Commonly known as: CADUET TAKE 1 TABLET DAILY   aspirin 81 MG tablet Take 81 mg by mouth daily. Once daily   cloNIDine HCl 0.1 MG Tb12 ER tablet Commonly known as: KAPVAY Take 0.1 mg by mouth 2 (two) times daily.   donepezil 5 MG tablet Commonly known as: ARICEPT Take 1 tablet (5 mg total) by mouth at bedtime.   doxazosin 2 MG tablet Commonly known as: CARDURA Take 2 mg by mouth 2 (two) times daily.   ferrous sulfate 325 (65 FE) MG tablet Take 325 mg by mouth in the morning and at bedtime.   fluticasone 50 MCG/ACT nasal spray Commonly known as: FLONASE Place 1 spray into both nostrils daily.   furosemide 40 MG tablet Commonly known as: LASIX 40 mg. Take two tablets by mouth in the morning and one in the evening   potassium chloride 10 MEQ CR capsule Commonly known as: MICRO-K Take 10 mEq by mouth daily. TAKE 1 TABLET DAILY   Vitamin D3 50 MCG (2000 UT) Tabs Take by mouth.         Objective:   BP (!) 172/68   Pulse 70   Wt 172 lb (78 kg)   SpO2 95%  BMI 24.68 kg/m   Wt Readings from Last 3 Encounters:  05/16/21 172 lb (78 kg)  05/13/21 173 lb 9.6 oz (78.7 kg)  05/02/21 170 lb 3.2 oz (77.2 kg)    Physical Exam Vitals and nursing note reviewed.  Constitutional:      General: He is not in acute distress.    Appearance: He is well-developed. He is not diaphoretic.  Eyes:     General: No scleral icterus.    Conjunctiva/sclera: Conjunctivae normal.  Neck:     Thyroid: No thyromegaly.  Cardiovascular:     Rate and Rhythm: Normal rate and regular rhythm.     Heart sounds: Normal heart sounds. No murmur heard. Pulmonary:     Effort: Pulmonary effort is normal. No respiratory distress.     Breath sounds: Normal breath sounds.  No wheezing.  Musculoskeletal:        General: Normal range of motion.     Cervical back: Neck supple.  Lymphadenopathy:     Cervical: No cervical adenopathy.  Skin:    General: Skin is warm and dry.     Findings: No rash.  Neurological:     Mental Status: He is alert and oriented to person, place, and time.     Coordination: Coordination normal.  Psychiatric:        Behavior: Behavior normal.      Assessment & Plan:   Problem List Items Addressed This Visit       Cardiovascular and Mediastinum   Hypertension   Relevant Orders   CMP14+EGFR   Accelerated secondary hypertension - Primary   Relevant Orders   CMP14+EGFR     Genitourinary   Chronic kidney disease (CKD), stage III (moderate) (HCC)   Relevant Orders   CMP14+EGFR     Other   Hyperlipidemia LDL goal <100   Other Visit Diagnoses     Need for immunization against influenza       Relevant Orders   Flu Vaccine QUAD High Dose(Fluad) (Completed)      Patient is getting excessive gas, recommended probiotic and increased fiber.  His blood pressure has been very challenging to control and this is better than he has been.  I would recommend to continue with nephrology and cardiology.  Follow up plan: Return in about 3 months (around 08/16/2021), or if symptoms worsen or fail to improve, for Hypertension.  Counseling provided for all of the vaccine components Orders Placed This Encounter  Procedures   Flu Vaccine QUAD High Dose(Fluad)   CMP14+EGFR    Caryl Pina, MD [facility] 05/16/2021, 4:17 PM

## 2021-06-05 DIAGNOSIS — R32 Unspecified urinary incontinence: Secondary | ICD-10-CM | POA: Diagnosis not present

## 2021-06-05 DIAGNOSIS — R194 Change in bowel habit: Secondary | ICD-10-CM | POA: Diagnosis not present

## 2021-06-25 ENCOUNTER — Ambulatory Visit (HOSPITAL_COMMUNITY): Payer: Medicare Other | Attending: Cardiology

## 2021-06-25 DIAGNOSIS — L821 Other seborrheic keratosis: Secondary | ICD-10-CM | POA: Diagnosis not present

## 2021-06-25 DIAGNOSIS — D225 Melanocytic nevi of trunk: Secondary | ICD-10-CM | POA: Diagnosis not present

## 2021-06-25 DIAGNOSIS — L718 Other rosacea: Secondary | ICD-10-CM | POA: Diagnosis not present

## 2021-06-25 DIAGNOSIS — L738 Other specified follicular disorders: Secondary | ICD-10-CM | POA: Diagnosis not present

## 2021-07-08 ENCOUNTER — Other Ambulatory Visit: Payer: Self-pay | Admitting: Family Medicine

## 2021-07-23 DIAGNOSIS — N184 Chronic kidney disease, stage 4 (severe): Secondary | ICD-10-CM | POA: Diagnosis not present

## 2021-07-24 ENCOUNTER — Other Ambulatory Visit: Payer: Self-pay | Admitting: Family Medicine

## 2021-07-31 ENCOUNTER — Telehealth: Payer: Self-pay | Admitting: Family Medicine

## 2021-08-04 DIAGNOSIS — N62 Hypertrophy of breast: Secondary | ICD-10-CM | POA: Diagnosis not present

## 2021-08-04 DIAGNOSIS — R600 Localized edema: Secondary | ICD-10-CM | POA: Diagnosis not present

## 2021-08-04 DIAGNOSIS — I129 Hypertensive chronic kidney disease with stage 1 through stage 4 chronic kidney disease, or unspecified chronic kidney disease: Secondary | ICD-10-CM | POA: Diagnosis not present

## 2021-08-04 DIAGNOSIS — I3139 Other pericardial effusion (noninflammatory): Secondary | ICD-10-CM | POA: Diagnosis not present

## 2021-08-04 DIAGNOSIS — N184 Chronic kidney disease, stage 4 (severe): Secondary | ICD-10-CM | POA: Diagnosis not present

## 2021-08-04 DIAGNOSIS — N2581 Secondary hyperparathyroidism of renal origin: Secondary | ICD-10-CM | POA: Diagnosis not present

## 2021-08-04 DIAGNOSIS — R5381 Other malaise: Secondary | ICD-10-CM | POA: Diagnosis not present

## 2021-08-04 DIAGNOSIS — I701 Atherosclerosis of renal artery: Secondary | ICD-10-CM | POA: Diagnosis not present

## 2021-08-04 DIAGNOSIS — E876 Hypokalemia: Secondary | ICD-10-CM | POA: Diagnosis not present

## 2021-08-04 DIAGNOSIS — D631 Anemia in chronic kidney disease: Secondary | ICD-10-CM | POA: Diagnosis not present

## 2021-08-04 DIAGNOSIS — R5382 Chronic fatigue, unspecified: Secondary | ICD-10-CM | POA: Diagnosis not present

## 2021-08-04 DIAGNOSIS — M109 Gout, unspecified: Secondary | ICD-10-CM | POA: Diagnosis not present

## 2021-08-19 ENCOUNTER — Telehealth: Payer: Self-pay | Admitting: Family Medicine

## 2021-08-20 ENCOUNTER — Encounter: Payer: Self-pay | Admitting: Family Medicine

## 2021-08-20 ENCOUNTER — Ambulatory Visit (INDEPENDENT_AMBULATORY_CARE_PROVIDER_SITE_OTHER): Payer: Medicare Other | Admitting: Family Medicine

## 2021-08-20 VITALS — BP 171/72 | HR 65 | Ht 70.0 in | Wt 178.0 lb

## 2021-08-20 DIAGNOSIS — I10 Essential (primary) hypertension: Secondary | ICD-10-CM | POA: Diagnosis not present

## 2021-08-20 DIAGNOSIS — N1832 Chronic kidney disease, stage 3b: Secondary | ICD-10-CM | POA: Diagnosis not present

## 2021-08-20 DIAGNOSIS — I159 Secondary hypertension, unspecified: Secondary | ICD-10-CM | POA: Diagnosis not present

## 2021-08-20 DIAGNOSIS — I701 Atherosclerosis of renal artery: Secondary | ICD-10-CM | POA: Diagnosis not present

## 2021-08-20 DIAGNOSIS — E785 Hyperlipidemia, unspecified: Secondary | ICD-10-CM | POA: Diagnosis not present

## 2021-08-20 NOTE — Progress Notes (Signed)
BP (!) 171/72    Pulse 65    Ht 5\' 10"  (1.778 m)    Wt 178 lb (80.7 kg)    SpO2 97%    BMI 25.54 kg/m    Subjective:   Patient ID: James [name], male    DOB: 25-Jan-1946, 76 y.o.   MRN: [id number]  HPI: James [name] is a 76 y.o. male presenting on 08/20/2021 for Medical Management of Chronic Issues, Hypertension, and Chronic Kidney Disease (/)   HPI Hypertension Patient is currently on clonidine and amlodipine and doxazosin and p.o., and their blood pressure today is 171/70. Patient denies any lightheadedness or dizziness. Patient denies headaches, blurred vision, chest pains, shortness of breath, or weakness. Denies any side effects from medication and is content with current medication.   Hyperlipidemia Patient is coming in for recheck of his hyperlipidemia. The patient is currently taking atorvastatin. They deny any issues with myalgias or history of liver damage from it. They deny any focal numbness or weakness or chest pain.   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  Seems to be stable.  They are monitoring closely, headaches accelerated hypertension likely because he had renal artery stenosis and failed stenting.  Relevant past medical, surgical, family and social history reviewed and updated as indicated. Interim medical history since our last visit reviewed. Allergies and medications reviewed and updated.  Review of Systems  Constitutional:  Negative for chills and fever.  Eyes:  Negative for visual disturbance.  Respiratory:  Negative for shortness of breath and wheezing.   Cardiovascular:  Negative for chest pain and leg swelling.  Musculoskeletal:  Negative for back pain and gait problem.  Skin:  Negative for rash.  Neurological:  Negative for dizziness, weakness and light-headedness.  All other systems reviewed and are negative.  Per HPI unless specifically indicated above   Allergies as of 08/20/2021       Reactions   Plavix [clopidogrel  Bisulfate] Other (See Comments)   GI Bleed   Uloric [febuxostat]         Medication List        Accurate as of August 20, 2021  4:12 PM. If you have any questions, ask your nurse or doctor.          STOP taking these medications    cloNIDine HCl 0.1 MG Tb12 ER tablet Commonly known as: KAPVAY Stopped by: [name] Benjamin Merrihew, MD       TAKE these medications    allopurinol 100 MG tablet Commonly known as: ZYLOPRIM TAKE 2 TABLETS DAILY   amLODipine-atorvastatin 10-40 MG tablet Commonly known as: CADUET TAKE 1 TABLET DAILY   aspirin 81 MG tablet Take 81 mg by mouth daily. Once daily   cloNIDine 0.2 MG tablet Commonly known as: CATAPRES Take 0.2 mg by mouth 2 (two) times daily.   donepezil 5 MG tablet Commonly known as: ARICEPT TAKE 1 TABLET AT BEDTIME   doxazosin 2 MG tablet Commonly known as: CARDURA Take 2 mg by mouth 2 (two) times daily.   ferrous sulfate 325 (65 FE) MG tablet Take 325 mg by mouth in the morning and at bedtime.   fluticasone 50 MCG/ACT nasal spray Commonly known as: FLONASE Place 1 spray into both nostrils daily.   furosemide 40 MG tablet Commonly known as: LASIX 40 mg. Take two tablets by mouth in the morning and one in the evening   potassium chloride 10 MEQ CR capsule Commonly known as: MICRO-K Take  10 mEq by mouth daily. TAKE 1 TABLET DAILY   Vitamin D3 50 MCG (2000 UT) Tabs Take by mouth.         Objective:   BP (!) 171/72    Pulse 65    Ht 5\' 10"  (1.778 m)    Wt 178 lb (80.7 kg)    SpO2 97%    BMI 25.54 kg/m   Wt Readings from Last 3 Encounters:  08/20/21 178 lb (80.7 kg)  05/16/21 172 lb (78 kg)  05/13/21 173 lb 9.6 oz (78.7 kg)    Physical Exam Vitals and nursing note reviewed.  Constitutional:      General: He is not in acute distress.    Appearance: He is well-developed. He is not diaphoretic.  Eyes:     General: No scleral icterus.    Conjunctiva/sclera: Conjunctivae normal.  Neck:     Thyroid: No  thyromegaly.  Cardiovascular:     Rate and Rhythm: Normal rate and regular rhythm.     Heart sounds: Normal heart sounds. No murmur heard. Pulmonary:     Effort: Pulmonary effort is normal. No respiratory distress.     Breath sounds: Normal breath sounds. No wheezing.  Musculoskeletal:        General: No swelling. Normal range of motion.     Cervical back: Neck supple.  Lymphadenopathy:     Cervical: No cervical adenopathy.  Skin:    General: Skin is warm and dry.     Findings: No rash.  Neurological:     Mental Status: He is alert and oriented to person, place, and time.     Coordination: Coordination normal.  Psychiatric:        Behavior: Behavior normal.      Assessment & Plan:   Problem List Items Addressed This Visit       Cardiovascular and Mediastinum   Hypertension - Primary   Relevant Medications   cloNIDine (CATAPRES) 0.2 MG tablet   Accelerated secondary hypertension   Relevant Medications   cloNIDine (CATAPRES) 0.2 MG tablet   Renal artery stenosis (HCC)   Relevant Medications   cloNIDine (CATAPRES) 0.2 MG tablet     Genitourinary   Chronic kidney disease (CKD), stage III (moderate) (HCC)     Other   Hyperlipidemia LDL goal <100   Relevant Medications   cloNIDine (CATAPRES) 0.2 MG tablet    Patient had labs through [facility] through Dr. Gaston Islam and he can see that everything looks stable.  No changes for now.  He continues to see them for accelerated hypertension and they did increase his clonidine recently. Follow up plan: Return in about 6 months (around 02/17/2022), or if symptoms worsen or fail to improve, for Hypertension cholesterol and CKD.  Counseling provided for all of the vaccine components No orders of the defined types were placed in this encounter.   Caryl Pina, MD [facility] 08/20/2021, 4:12 PM

## 2021-09-21 ENCOUNTER — Other Ambulatory Visit: Payer: Self-pay | Admitting: Family Medicine

## 2021-10-03 ENCOUNTER — Telehealth: Payer: Self-pay

## 2021-10-03 NOTE — Chronic Care Management (AMB) (Signed)
?  Chronic Care Management  ? ?Outreach Note ? ?10/03/2021 ?Name: [name] MRN: [id number] DOB: [birth year] ? ?James [name] is a 76 y.o. year old male who is a primary care patient of [name], [name], MD. I reached out to [place] by phone today in response to a referral sent by Mr. Daud Cayer Barnette's primary care provider. ? ?An unsuccessful telephone outreach was attempted today. The patient was referred to the case management team for assistance with care management and care coordination.  ? ?Follow Up Plan: A HIPAA compliant phone message was left for the patient providing contact information and requesting a return call.  ?The care management team will reach out to the patient again over the next 7 days.  ?If patient returns call to provider office, please advise to call [place] * at [phone]* ? ?Noreene Larsson, RMA ?Care Guide, Embedded Care Coordination ?  Care Management  ?[place], [place] [address] ?Direct Dial: [phone] ?[email or web address] ?Website: [email or web address]  ? ?

## 2021-10-06 NOTE — Telephone Encounter (Signed)
Patient returning call.

## 2021-10-07 ENCOUNTER — Telehealth: Payer: Self-pay | Admitting: Family Medicine

## 2021-10-07 MED ORDER — AMLODIPINE-ATORVASTATIN 10-40 MG PO TABS: 1.0000 | ORAL_TABLET | Freq: Every day | ORAL | 1 refills | Status: DC

## 2021-10-07 NOTE — Telephone Encounter (Signed)
?  Prescription Request ? ?10/07/2021 ? ?Is this a "Controlled Substance" medicine? no ? ?Have you seen your PCP in the last 2 weeks? yes ? ?If YES, route message to pool  -  If NO, patient needs to be scheduled for appointment. ? ?What is the name of the medication or equipment? Amolodipine ? ?Have you contacted your pharmacy to request a refill? yes  ? ?Which pharmacy would you like this sent to? [facility] mail order ? ? ?Patient notified that their request is being sent to the clinical staff for review and that they should receive a response within 2 business days.  ?  ?[name]'s pt. ? ?Please call my son about this! ?

## 2021-10-07 NOTE — Telephone Encounter (Signed)
LMOVM refill sent to [facility] 

## 2021-10-08 NOTE — Chronic Care Management (AMB) (Signed)
?  Chronic Care Management  ? ?Note ? ?10/08/2021 ?Name: James [name] MRN: [id number] DOB: 1946/07/20 ? ?Bearl Talarico [name] is a 76 y.o. year old male who is a primary care patient of [name], [name], MD. I reached out to [place] by phone today in response to a referral sent by Mr. Ezreal Turay Tullos's PCP. ? ?Mr. Sweeting was given information about Chronic Care Management services today including:  ?CCM service includes personalized support from designated clinical staff supervised by his physician, including individualized plan of care and coordination with other care providers ?24/7 contact phone numbers for assistance for urgent and routine care needs. ?Service will only be billed when office clinical staff spend 20 minutes or more in a month to coordinate care. ?Only one practitioner may furnish and bill the service in a calendar month. ?The patient may stop CCM services at any time (effective at the end of the month) by phone call to the office staff. ?The patient is responsible for co-pay (up to 20% after annual deductible is met) if co-pay is required by the individual health plan.  ? ?Patient did not agree to enrollment in care management services and does not wish to consider at this time. ? ?Follow up plan: ?Patient declines further follow up and engagement by the care management team. Appropriate care team members and provider have been notified via electronic communication.  ? ?Noreene Larsson, RMA ?Care Guide, Embedded Care Coordination ?[facility] [place]  Care Management  ?[place], [place] [address] ?Direct Dial: [phone] ?[email or web address] ?Website: [email or web address]  ? ?

## 2021-10-25 ENCOUNTER — Other Ambulatory Visit: Payer: Self-pay | Admitting: Family Medicine

## 2021-10-27 ENCOUNTER — Ambulatory Visit (HOSPITAL_COMMUNITY): Payer: Medicare Other | Attending: Cardiology

## 2021-10-27 DIAGNOSIS — I3139 Other pericardial effusion (noninflammatory): Secondary | ICD-10-CM | POA: Diagnosis not present

## 2021-10-27 LAB — ECHOCARDIOGRAM COMPLETE
Area-P 1/2: 3.36 cm2
Calc EF: 57.3 %
S' Lateral: 3.9 cm
Single Plane A2C EF: 56.7 %
Single Plane A4C EF: 55.9 %

## 2021-11-17 DIAGNOSIS — I6523 Occlusion and stenosis of bilateral carotid arteries: Secondary | ICD-10-CM | POA: Insufficient documentation

## 2021-11-17 NOTE — Progress Notes (Signed)
?  ?Cardiology Office Note ? ? ?Date:  11/19/2021  ? ?ID:  James [name], DOB 24-Oct-1945, MRN [id number] ? ?PCP:  [name], [name], MD  ?Cardiologist:   James Breeding, MD ?Referring:  [name], [name], MD ? ?Chief Complaint  ?Patient presents with  ? Pericardial Effusion  ? ? ? ? ?History of Present Illness: ?James [name] is a 76 y.o. male who presents for evaluation of fatigue and difficult to control hypertension.  At an appt recently I ordered an echo to evaluate an abnormal EKG.   This demonstrated a large pericardial effusion.  I sent him for follow echo and it was still large although it was possibly mild less than previous.   I discontinued hydralazine.  He had no evidence of tamponade.   ? ?Since I last saw him he has had follow up echo and the effusion is larger than previous.  There was no evidence of change of mitral valve inflow velocities and no RV diastolic collapse.  He has been hypertensive not hypotensive.  He had no new shortness of breath although he is not overtly active.  He just says he is "lazy."  He is not describing any PND or orthopnea.  Has had no palpitations, presyncope or syncope.  He denies any chest discomfort.  He has chronic lower extremity swelling which is not different than before.  He has chronic fatigue. ? ? ?Past Medical History:  ?Diagnosis Date  ? Arthritis   ? Carotid artery occlusion   ? DVT (deep venous thrombosis) ([place]) 2008  ? GERD (gastroesophageal reflux disease)   ? takes Protonix daily  ? Gout   ? takes Allopurinol daily and Indomethacin prn  ? H/O renal artery stenosis   ? H/O: GI bleed   ? History of blood transfusion   ? History of colon polyps   ? History of gastric ulcer   ? Hyperlipidemia   ? takes Lipitor daily  ? Hypertension   ? takes Metoprolol and Amlodipine daily  ? Joint pain   ? Joint swelling   ? ? ?Past Surgical History:  ?Procedure Laterality Date  ? CAROTID ENDARTERECTOMY  2008  ? right CEA  ? CHOLECYSTECTOMY  mid 90's  ? COLON  SURGERY    ? COLONOSCOPY    ? ENDARTERECTOMY  06/02/2012  ? Procedure: ENDARTERECTOMY CAROTID;  Surgeon: James Mitchell, MD;  Location: [facility] OR;  Service: Vascular;  Laterality: Left;  ? ESOPHAGOGASTRODUODENOSCOPY    ? HERNIA REPAIR    ? umbilical hernia  ? PATCH ANGIOPLASTY  06/02/2012  ? Procedure: PATCH ANGIOPLASTY;  Surgeon: James Mitchell, MD;  Location: [facility];  Service: Vascular;  Laterality: Left;  using Vascu-Guard Patch  ? PERIPHERAL VASCULAR CATHETERIZATION Right 12/03/2015  ? Procedure: Peripheral Vascular Balloon Angioplasty;  Surgeon: James Mitchell, MD;  Location: [facility] CV LAB;  Service: Cardiovascular;  Laterality: Right;  RENAL unsuccessful  ? RENAL ARTERY STENT  2009  ? Right renal artery stenting-  Right Kidney  ? small intestine removed  2001  ? d/t random gi bleeding  ? ? ? ?Current Outpatient Medications  ?Medication Sig Dispense Refill  ? allopurinol (ZYLOPRIM) 100 MG tablet TAKE 2 TABLETS DAILY 180 tablet 1  ? amLODipine-atorvastatin (CADUET) 10-40 MG tablet Take 1 tablet by mouth daily. 90 tablet 1  ? aspirin 81 MG tablet Take 81 mg by mouth daily. Once daily    ? Cholecalciferol (VITAMIN D3) 50 MCG (2000 UT) TABS Take by mouth.    ?  cloNIDine (CATAPRES) 0.2 MG tablet Take 0.2 mg by mouth 2 (two) times daily.    ? donepezil (ARICEPT) 5 MG tablet TAKE 1 TABLET AT BEDTIME 90 tablet 0  ? doxazosin (CARDURA) 2 MG tablet Take 2 mg by mouth 2 (two) times daily.    ? ferrous sulfate 325 (65 FE) MG tablet Take 325 mg by mouth in the morning and at bedtime.    ? fluticasone (FLONASE) 50 MCG/ACT nasal spray Place 1 spray into both nostrils daily. 16 g 2  ? furosemide (LASIX) 40 MG tablet 40 mg. Take two tablets by mouth in the morning and one in the evening    ? potassium chloride (MICRO-K) 10 MEQ CR capsule Take 10 mEq by mouth daily. TAKE 1 TABLET DAILY    ? ?No current facility-administered medications for this visit.  ? ? ?Allergies:   Plavix [clopidogrel bisulfate] and Uloric [febuxostat]   ? ? ?ROS:  Please see the history of present illness.   Otherwise, review of systems are for none .   All other systems are reviewed and negative.  ? ? ?PHYSICAL EXAM: ?VS:  BP (!) 185/78   Pulse 63   Ht '5\' 10"'$  (1.778 m)   Wt 174 lb 12.8 oz (79.3 kg)   SpO2 97%   BMI 25.08 kg/m?  , BMI Body mass index is 25.08 kg/m?. ?GENERAL:  Well appearing ?NECK:  No jugular venous distention, waveform within normal limits, carotid upstroke brisk and symmetric, positive bruits, no thyromegaly ?LUNGS:  Clear to auscultation bilaterally ?CHEST:  Unremarkable ?HEART:  PMI not displaced or sustained,S1 and S2 within normal limits, no S3, no S4, no clicks, no rubs, no murmurs ?ABD:  Flat, positive bowel sounds normal in frequency in pitch, positive midline bruits, no rebound, no guarding, no midline pulsatile mass, no hepatomegaly, no splenomegaly ?EXT:  2 plus pulses throughout, moderate bilateral leg edema, no cyanosis no clubbing ? ? ?EKG:  EKG is not ordered today. ? ? ?Recent Labs: ?02/12/2021: ALT 13; BUN 33; Creatinine, Ser 2.27; Hemoglobin 12.5; Platelets 252; Potassium 4.2; Sodium 140; TSH 1.420  ? ? ?Lipid Panel ?   ?Component Value Date/Time  ? CHOL 126 02/12/2021 1105  ? TRIG 101 02/12/2021 1105  ? HDL 57 02/12/2021 1105  ? CHOLHDL 2.2 02/12/2021 1105  ? Guffey 50 02/12/2021 1105  ? ?  ? ?Wt Readings from Last 3 Encounters:  ?11/18/21 174 lb 12.8 oz (79.3 kg)  ?08/20/21 178 lb (80.7 kg)  ?05/16/21 172 lb (78 kg)  ?  ? ? ?Other studies Reviewed: ?Additional studies/ records that were reviewed today include: None ?Review of the above records demonstrates:  NA  ? ? ?ASSESSMENT AND PLAN: ? ?PERICARDIAL EFFUSION:    The patient his son and I had another long discussion.  He says he has no symptoms and he would like to defer any management of this.  Most of the fluid is posterior.  He might be drainable percutaneously but it could be that we need a window.  He would rather wait until he has any symptoms or absolute  indication to do anything.  He does agree to 1-monthfollow-up echo.   He does agree to go to the emergency room if he has any hypotension or syncope and to let me know immediately if he has any increased shortness of breath. ? ?HTN:  His BP is not at target.  However, it is much better controlled at home readings and he will keep a BP  diary.   ? ?FATIGUE:    This is chronic.  Labs have been unremarkable.  There is been no clear cardiac etiology.  ? ?RAS:   He is followed by [facility] and has had right renal artery occlusion of a previous stent.  His left has moderate stenosis.   ? ?CAROTID STENOSIS:    This is mild  ? ?HTN:    His blood pressure is being managed by nephrology.  He increased his clonidine.  I will defer further changes and he might go to 0.3 twice daily at the next visit.  Renal function has been stable and I did review these labs. ? ?CKD:    Creat is 2.39 which is relatively stable.  No change in therapy. ? ? ?Current medicines are reviewed at length with the patient today.  The patient does not have concerns regarding medicines. ? ?The following changes have been made:    None ? ?Labs/ tests ordered today include: Echo ? ?Orders Placed This Encounter  ?Procedures  ? ECHOCARDIOGRAM COMPLETE  ? ? ?Disposition:   FU with me in me after the next echo ? ? ?Signed, ?James Breeding, MD  ?11/19/2021 7:07 AM    ?[place] ? ?

## 2021-11-18 ENCOUNTER — Encounter: Payer: Self-pay | Admitting: Cardiology

## 2021-11-18 ENCOUNTER — Ambulatory Visit (INDEPENDENT_AMBULATORY_CARE_PROVIDER_SITE_OTHER): Payer: Medicare Other | Admitting: Cardiology

## 2021-11-18 VITALS — BP 185/78 | HR 63 | Ht 70.0 in | Wt 174.8 lb

## 2021-11-18 DIAGNOSIS — I3139 Other pericardial effusion (noninflammatory): Secondary | ICD-10-CM

## 2021-11-18 DIAGNOSIS — I701 Atherosclerosis of renal artery: Secondary | ICD-10-CM | POA: Diagnosis not present

## 2021-11-18 DIAGNOSIS — N1832 Chronic kidney disease, stage 3b: Secondary | ICD-10-CM | POA: Diagnosis not present

## 2021-11-18 DIAGNOSIS — I6523 Occlusion and stenosis of bilateral carotid arteries: Secondary | ICD-10-CM

## 2021-11-18 DIAGNOSIS — R5382 Chronic fatigue, unspecified: Secondary | ICD-10-CM

## 2021-11-18 NOTE — Patient Instructions (Signed)
Medication Instructions:  ?Your Physician recommend you continue on your current medication as directed.   ? ?*If you need a refill on your cardiac medications before your next appointment, please call your pharmacy* ? ? ?Lab Work: ?None ordered today ? ? ?Testing/Procedures: ?Your physician has requested that you have an echocardiogram in October, 2023. Echocardiography is a painless test that uses sound waves to create images of your heart. It provides your doctor with information about the size and shape of your heart and how well your heart?s chambers and valves are working. This procedure takes approximately one hour. There are no restrictions for this procedure. ?[address] ? ? ? ?Follow-Up: ?At [facility], you and your health needs are our priority.  As part of our continuing mission to provide you with exceptional heart care, we have created designated Provider Care Teams.  These Care Teams include your primary Cardiologist (physician) and Advanced Practice Providers (APPs -  Physician Assistants and Nurse Practitioners) who all work together to provide you with the care you need, when you need it. ? ?We recommend signing up for the patient portal called "MyChart".  Sign up information is provided on this After Visit Summary.  MyChart is used to connect with patients for Virtual Visits (Telemedicine).  Patients are able to view lab/test results, encounter notes, upcoming appointments, etc.  Non-urgent messages can be sent to your provider as well.   ?To learn more about what you can do with MyChart, go to [email or web address].   ? ?Your next appointment:   ?6 month(s) ? ?The format for your next appointment:   ?In Person ? ?Provider:   ?Minus Breeding, MD { ? ? ?Important Information About [place] ? ? ? ? ? ? ?

## 2021-11-19 ENCOUNTER — Encounter: Payer: Self-pay | Admitting: Cardiology

## 2021-11-20 ENCOUNTER — Encounter: Payer: Self-pay | Admitting: *Deleted

## 2021-11-20 NOTE — Telephone Encounter (Signed)
This encounter was created in error - please disregard.

## 2021-11-26 DIAGNOSIS — N184 Chronic kidney disease, stage 4 (severe): Secondary | ICD-10-CM | POA: Diagnosis not present

## 2021-11-27 DIAGNOSIS — Z20822 Contact with and (suspected) exposure to covid-19: Secondary | ICD-10-CM | POA: Diagnosis not present

## 2021-12-01 DIAGNOSIS — E876 Hypokalemia: Secondary | ICD-10-CM | POA: Diagnosis not present

## 2021-12-01 DIAGNOSIS — I129 Hypertensive chronic kidney disease with stage 1 through stage 4 chronic kidney disease, or unspecified chronic kidney disease: Secondary | ICD-10-CM | POA: Diagnosis not present

## 2021-12-01 DIAGNOSIS — N184 Chronic kidney disease, stage 4 (severe): Secondary | ICD-10-CM | POA: Diagnosis not present

## 2021-12-01 DIAGNOSIS — D631 Anemia in chronic kidney disease: Secondary | ICD-10-CM | POA: Diagnosis not present

## 2021-12-01 DIAGNOSIS — Z20822 Contact with and (suspected) exposure to covid-19: Secondary | ICD-10-CM | POA: Diagnosis not present

## 2021-12-01 DIAGNOSIS — N2581 Secondary hyperparathyroidism of renal origin: Secondary | ICD-10-CM | POA: Diagnosis not present

## 2021-12-01 DIAGNOSIS — R600 Localized edema: Secondary | ICD-10-CM | POA: Diagnosis not present

## 2021-12-01 DIAGNOSIS — I3139 Other pericardial effusion (noninflammatory): Secondary | ICD-10-CM | POA: Diagnosis not present

## 2021-12-01 DIAGNOSIS — I701 Atherosclerosis of renal artery: Secondary | ICD-10-CM | POA: Diagnosis not present

## 2021-12-10 ENCOUNTER — Ambulatory Visit (INDEPENDENT_AMBULATORY_CARE_PROVIDER_SITE_OTHER): Payer: Medicare Other | Admitting: Family Medicine

## 2021-12-10 ENCOUNTER — Encounter: Payer: Self-pay | Admitting: Family Medicine

## 2021-12-10 VITALS — BP 166/62 | HR 48 | Ht 70.0 in | Wt 170.0 lb

## 2021-12-10 DIAGNOSIS — I861 Scrotal varices: Secondary | ICD-10-CM

## 2021-12-10 DIAGNOSIS — I701 Atherosclerosis of renal artery: Secondary | ICD-10-CM | POA: Diagnosis not present

## 2021-12-10 NOTE — Progress Notes (Signed)
? ?BP (!) 166/62   Pulse (!) 48   Ht '5\' 10"'$  (1.778 m)   Wt 170 lb (77.1 kg)   SpO2 99%   BMI 24.39 kg/m?   ? ?Subjective:  ? ?Patient ID: James [name], male    DOB: [birth year], 76 y.o.   MRN: [id number] ? ?HPI: ?James [name] is a 76 y.o. male presenting on 12/10/2021 for nodule on testicle (Bleeding at times. Unsure how long it has been present) ? ? ?HPI ?Patient is coming in for a lump on the left side of scrotum that started, he cannot really see it but he does feel like it is tender but he is normally tender on that side.  He does have the varicocele for much of his life but then this new abscesses come up just recently.  He says its slightly tender been irritating him and that is why he wanted to come get it checked out. ? ?Relevant past medical, surgical, family and social history reviewed and updated as indicated. Interim medical history since our last visit reviewed. ?Allergies and medications reviewed and updated. ? ?Review of Systems  ?Constitutional:  Negative for chills and fever.  ?Eyes:  Negative for discharge.  ?Respiratory:  Negative for shortness of breath and wheezing.   ?Cardiovascular:  Negative for chest pain and leg swelling.  ?Musculoskeletal:  Negative for back pain and gait problem.  ?Skin:  Positive for color change. Negative for rash.  ?All other systems reviewed and are negative. ? ?Per HPI unless specifically indicated above ? ? ?Allergies as of 12/10/2021   ? ?   Reactions  ? Plavix [clopidogrel Bisulfate] Other (See Comments)  ? GI Bleed  ? Uloric [febuxostat]   ? ?  ? ?  ?Medication List  ?  ? ?  ? Accurate as of Dec 10, 2021  4:54 PM. If you have any questions, ask your nurse or doctor.  ?  ?  ? ?  ? ?allopurinol 100 MG tablet ?Commonly known as: ZYLOPRIM ?TAKE 2 TABLETS DAILY ?  ?amLODipine-atorvastatin 10-40 MG tablet ?Commonly known as: CADUET ?Take 1 tablet by mouth daily. ?  ?aspirin 81 MG tablet ?Take 81 mg by mouth daily. Once daily ?  ?cloNIDine 0.2 MG  tablet ?Commonly known as: CATAPRES ?Take 0.2 mg by mouth 2 (two) times daily. ?  ?donepezil 5 MG tablet ?Commonly known as: ARICEPT ?TAKE 1 TABLET AT BEDTIME ?  ?doxazosin 2 MG tablet ?Commonly known as: CARDURA ?Take 2 mg by mouth 2 (two) times daily. ?  ?ferrous sulfate 325 (65 FE) MG tablet ?Take 325 mg by mouth in the morning and at bedtime. ?  ?fluticasone 50 MCG/ACT nasal spray ?Commonly known as: FLONASE ?Place 1 spray into both nostrils daily. ?  ?furosemide 40 MG tablet ?Commonly known as: LASIX ?40 mg. Take two tablets by mouth in the morning and one in the evening ?  ?potassium chloride 10 MEQ CR capsule ?Commonly known as: MICRO-K ?Take 10 mEq by mouth daily. TAKE 1 TABLET DAILY ?  ?Vitamin D3 50 MCG (2000 UT) Tabs ?Take by mouth. ?  ? ?  ? ? ? ?Objective:  ? ?BP (!) 166/62   Pulse (!) 48   Ht '5\' 10"'$  (1.778 m)   Wt 170 lb (77.1 kg)   SpO2 99%   BMI 24.39 kg/m?   ?Wt Readings from Last 3 Encounters:  ?12/10/21 170 lb (77.1 kg)  ?11/18/21 174 lb 12.8 oz (79.3 kg)  ?08/20/21 178 lb (80.7  kg)  ?  ?Physical Exam ?Vitals and nursing note reviewed.  ?Constitutional:   ?   General: He is not in acute distress. ?   Appearance: He is well-developed. He is not diaphoretic.  ?Eyes:  ?   General: No scleral icterus. ?   Conjunctiva/sclera: Conjunctivae normal.  ?Skin: ?   Findings: Lesion (Large protruding varicocele from left side of scrotum) present. No rash.  ?Neurological:  ?   Mental Status: He is alert and oriented to person, place, and time.  ?   Coordination: Coordination normal.  ?Psychiatric:     ?   Behavior: Behavior normal.  ? ? ? ? ?Assessment & Plan:  ? ?Problem List Items Addressed This Visit   ?None ?Visit Diagnoses   ? ? Scrotal varicose veins    -  Primary  ? Relevant Orders  ? Ambulatory referral to Urology  ? ?  ?  ?We will refer to urology to see about removal. ?Follow up plan: ?Return if symptoms worsen or fail to improve. ? ?Counseling provided for all of the vaccine components ?Orders  Placed This Encounter  ?Procedures  ? Ambulatory referral to Urology  ? ? ?Caryl Pina, MD ?[place] ?12/10/2021, 4:54 PM ? ? ?  ?

## 2022-01-17 ENCOUNTER — Other Ambulatory Visit: Payer: Self-pay | Admitting: Family Medicine

## 2022-02-13 ENCOUNTER — Encounter: Payer: Self-pay | Admitting: Family Medicine

## 2022-02-13 ENCOUNTER — Ambulatory Visit (INDEPENDENT_AMBULATORY_CARE_PROVIDER_SITE_OTHER): Payer: Medicare Other | Admitting: Family Medicine

## 2022-02-13 VITALS — BP 161/60 | HR 56 | Temp 98.0°F | Ht 70.0 in | Wt 170.0 lb

## 2022-02-13 DIAGNOSIS — I10 Essential (primary) hypertension: Secondary | ICD-10-CM

## 2022-02-13 DIAGNOSIS — K2100 Gastro-esophageal reflux disease with esophagitis, without bleeding: Secondary | ICD-10-CM

## 2022-02-13 DIAGNOSIS — N1832 Chronic kidney disease, stage 3b: Secondary | ICD-10-CM

## 2022-02-13 DIAGNOSIS — E785 Hyperlipidemia, unspecified: Secondary | ICD-10-CM

## 2022-02-13 DIAGNOSIS — F01B0 Vascular dementia, moderate, without behavioral disturbance, psychotic disturbance, mood disturbance, and anxiety: Secondary | ICD-10-CM

## 2022-02-13 DIAGNOSIS — I159 Secondary hypertension, unspecified: Secondary | ICD-10-CM | POA: Diagnosis not present

## 2022-02-13 DIAGNOSIS — I701 Atherosclerosis of renal artery: Secondary | ICD-10-CM

## 2022-02-13 MED ORDER — MEMANTINE HCL 10 MG PO TABS: 10.0000 mg | ORAL_TABLET | Freq: Two times a day (BID) | ORAL | 3 refills | Status: DC

## 2022-02-13 MED ORDER — DONEPEZIL HCL 5 MG PO TABS: 5.0000 mg | ORAL_TABLET | Freq: Every day | ORAL | 3 refills | Status: DC

## 2022-02-13 MED ORDER — AMLODIPINE-ATORVASTATIN 10-40 MG PO TABS
1.0000 | ORAL_TABLET | Freq: Every day | ORAL | 3 refills | Status: DC
Start: 2022-02-13 — End: 2022-09-25

## 2022-02-13 MED ORDER — ALLOPURINOL 100 MG PO TABS: 200.0000 mg | ORAL_TABLET | Freq: Every day | ORAL | 3 refills | Status: DC

## 2022-02-13 NOTE — Progress Notes (Signed)
BP (!) 161/60   Pulse (!) 56   Temp 98 F (36.7 C)   Ht 5' 10"  (1.778 m)   Wt 170 lb (77.1 kg)   SpO2 97%   BMI 24.39 kg/m    Subjective:   Patient ID: James [name], male    DOB: [birth year], 76 y.o.   MRN: [id number]  HPI: James [name] is a 76 y.o. male presenting on 02/13/2022 for No chief complaint on file.   HPI Hypertension and CKD and renal artery stenosis Patient is currently on amlodipine and clonidine and doxazosin and furosemide, and their blood pressure today is 161/60, he sees urology for this. Patient denies any lightheadedness or dizziness. Patient denies headaches, blurred vision, chest pains, shortness of breath, or weakness. Denies any side effects from medication and is content with current medication.   Hyperlipidemia Patient is coming in for recheck of his hyperlipidemia. The patient is currently taking atorvastatin. They deny any issues with myalgias or history of liver damage from it. They deny any focal numbness or weakness or chest pain.   GERD Patient is currently on no medicine currently.  She denies any major symptoms or abdominal pain or belching or burping. She denies any blood in her stool or lightheadedness or dizziness.   Patient has vascular dementia and the donepezil that we tried did not seem to help and they would like to try to see if something else to help.  He continues to have issues with memory is getting worse for memory people and memory things.  Relevant past medical, surgical, family and social history reviewed and updated as indicated. Interim medical history since our last visit reviewed. Allergies and medications reviewed and updated.  Review of Systems  Constitutional:  Positive for fatigue. Negative for chills and fever.  HENT:  Negative for ear pain and tinnitus.   Eyes:  Negative for pain and visual disturbance.  Respiratory:  Negative for cough, shortness of breath and wheezing.   Cardiovascular:  Negative for  chest pain, palpitations and leg swelling.  Gastrointestinal:  Negative for abdominal pain, blood in stool, constipation and diarrhea.  Genitourinary:  Negative for dysuria and hematuria.  Musculoskeletal:  Negative for back pain, gait problem and myalgias.  Skin:  Negative for rash.  Neurological:  Negative for dizziness, weakness and headaches.  Psychiatric/Behavioral:  Positive for confusion and decreased concentration. Negative for suicidal ideas.   All other systems reviewed and are negative.   Per HPI unless specifically indicated above   Allergies as of 02/13/2022       Reactions   Plavix [clopidogrel Bisulfate] Other (See Comments)   GI Bleed   Uloric [febuxostat]         Medication List        Accurate as of February 13, 2022  4:02 PM. If you have any questions, ask your nurse or doctor.          allopurinol 100 MG tablet Commonly known as: ZYLOPRIM Take 2 tablets (200 mg total) by mouth daily.   amLODipine-atorvastatin 10-40 MG tablet Commonly known as: CADUET Take 1 tablet by mouth daily.   aspirin 81 MG tablet Take 81 mg by mouth daily. Once daily   cloNIDine 0.2 MG tablet Commonly known as: CATAPRES Take 0.2 mg by mouth 2 (two) times daily.   donepezil 5 MG tablet Commonly known as: ARICEPT Take 1 tablet (5 mg total) by mouth at bedtime.   doxazosin 2 MG tablet Commonly known as: CARDURA Take  2 mg by mouth 2 (two) times daily.   ferrous sulfate 325 (65 FE) MG tablet Take 325 mg by mouth in the morning and at bedtime.   fluticasone 50 MCG/ACT nasal spray Commonly known as: FLONASE Place 1 spray into both nostrils daily.   furosemide 40 MG tablet Commonly known as: LASIX 40 mg. Take two tablets by mouth in the morning and one in the evening   memantine 10 MG tablet Commonly known as: NAMENDA Take 1 tablet (10 mg total) by mouth 2 (two) times daily. Started by: [name] Norbert Malkin, MD   potassium chloride 10 MEQ CR capsule Commonly known  as: MICRO-K Take 10 mEq by mouth daily. TAKE 1 TABLET DAILY   Vitamin D3 50 MCG (2000 UT) Tabs Take by mouth.         Objective:   BP (!) 161/60   Pulse (!) 56   Temp 98 F (36.7 C)   Ht 5' 10"  (1.778 m)   Wt 170 lb (77.1 kg)   SpO2 97%   BMI 24.39 kg/m   Wt Readings from Last 3 Encounters:  02/13/22 170 lb (77.1 kg)  12/10/21 170 lb (77.1 kg)  11/18/21 174 lb 12.8 oz (79.3 kg)    Physical Exam Vitals and nursing note reviewed.  Constitutional:      General: He is not in acute distress.    Appearance: He is well-developed. He is not diaphoretic.  Eyes:     General: No scleral icterus.    Conjunctiva/sclera: Conjunctivae normal.  Neck:     Thyroid: No thyromegaly.  Cardiovascular:     Rate and Rhythm: Normal rate and regular rhythm.     Heart sounds: Normal heart sounds. No murmur heard. Pulmonary:     Effort: Pulmonary effort is normal. No respiratory distress.     Breath sounds: Normal breath sounds. No wheezing.  Musculoskeletal:        General: Swelling (1+ bilateral lower extremity) present. Normal range of motion.     Cervical back: Neck supple.  Lymphadenopathy:     Cervical: No cervical adenopathy.  Skin:    General: Skin is warm and dry.     Findings: No rash.  Neurological:     Mental Status: He is alert and oriented to person, place, and time.     Coordination: Coordination normal.  Psychiatric:        Behavior: Behavior normal.       Assessment & Plan:   Problem List Items Addressed This Visit       Cardiovascular and Mediastinum   Hypertension - Primary   Relevant Medications   amLODipine-atorvastatin (CADUET) 10-40 MG tablet   Other Relevant Orders   CBC with Differential/Platelet   CMP14+EGFR   Lipid panel   Thyroid Panel With TSH   Accelerated secondary hypertension   Relevant Medications   amLODipine-atorvastatin (CADUET) 10-40 MG tablet     Digestive   Gastroesophageal reflux disease with esophagitis   Relevant Orders    CBC with Differential/Platelet   CMP14+EGFR   Lipid panel   Thyroid Panel With TSH     Genitourinary   Chronic kidney disease (CKD), stage III (moderate) (HCC)   Relevant Orders   CBC with Differential/Platelet   CMP14+EGFR   Lipid panel   Thyroid Panel With TSH     Other   Hyperlipidemia LDL goal <100   Relevant Medications   amLODipine-atorvastatin (CADUET) 10-40 MG tablet   Other Relevant Orders   CBC with Differential/Platelet  CMP14+EGFR   Lipid panel   Thyroid Panel With TSH   Other Visit Diagnoses     Moderate vascular dementia without behavioral disturbance, psychotic disturbance, mood disturbance, or anxiety (HCC)       Relevant Medications   donepezil (ARICEPT) 5 MG tablet   memantine (NAMENDA) 10 MG tablet       Will add Namenda and do blood work Follow up plan: Return in about 6 months (around 08/16/2022), or if symptoms worsen or fail to improve, for Follow-up hypertension and cholesterol.  Counseling provided for all of the vaccine components Orders Placed This Encounter  Procedures   CBC with Differential/Platelet   CMP14+EGFR   Lipid panel   Thyroid Panel With TSH    Caryl Pina, MD [facility] 02/13/2022, 4:02 PM

## 2022-02-14 LAB — CMP14+EGFR
ALT: 17 IU/L (ref 0–44)
AST: 18 IU/L (ref 0–40)
Albumin/Globulin Ratio: 1.7 (ref 1.2–2.2)
Albumin: 4.2 g/dL (ref 3.8–4.8)
Alkaline Phosphatase: 197 IU/L — ABNORMAL HIGH (ref 44–121)
BUN/Creatinine Ratio: 13 (ref 10–24)
BUN: 36 mg/dL — ABNORMAL HIGH (ref 8–27)
Bilirubin Total: 0.6 mg/dL (ref 0.0–1.2)
CO2: 27 mmol/L (ref 20–29)
Calcium: 9.3 mg/dL (ref 8.6–10.2)
Chloride: 95 mmol/L — ABNORMAL LOW (ref 96–106)
Creatinine, Ser: 2.79 mg/dL — ABNORMAL HIGH (ref 0.76–1.27)
Globulin, Total: 2.5 g/dL (ref 1.5–4.5)
Glucose: 105 mg/dL — ABNORMAL HIGH (ref 70–99)
Potassium: 3.8 mmol/L (ref 3.5–5.2)
Sodium: 137 mmol/L (ref 134–144)
Total Protein: 6.7 g/dL (ref 6.0–8.5)
eGFR: 23 mL/min/{1.73_m2} — ABNORMAL LOW (ref >59)

## 2022-02-14 LAB — CBC WITH DIFFERENTIAL/PLATELET
Basophils Absolute: 0.1 10*3/uL (ref 0.0–0.2)
Basos: 1 %
EOS (ABSOLUTE): 0.1 10*3/uL (ref 0.0–0.4)
Eos: 1 %
Hematocrit: 34.3 % — ABNORMAL LOW (ref 37.5–51.0)
Hemoglobin: 11.4 g/dL — ABNORMAL LOW (ref 13.0–17.7)
Immature Grans (Abs): 0 10*3/uL (ref 0.0–0.1)
Immature Granulocytes: 0 %
Lymphocytes Absolute: 1.1 10*3/uL (ref 0.7–3.1)
Lymphs: 14 %
MCH: 27.3 pg (ref 26.6–33.0)
MCHC: 33.2 g/dL (ref 31.5–35.7)
MCV: 82 fL (ref 79–97)
Monocytes Absolute: 0.5 10*3/uL (ref 0.1–0.9)
Monocytes: 6 %
Neutrophils Absolute: 6.7 10*3/uL (ref 1.4–7.0)
Neutrophils: 78 %
Platelets: 264 10*3/uL (ref 150–450)
RBC: 4.18 x10E6/uL (ref 4.14–5.80)
RDW: 14.6 % (ref 11.6–15.4)
WBC: 8.5 10*3/uL (ref 3.4–10.8)

## 2022-02-14 LAB — LIPID PANEL
Chol/HDL Ratio: 2.1 ratio (ref 0.0–5.0)
Cholesterol, Total: 127 mg/dL (ref 100–199)
HDL: 61 mg/dL (ref >39)
LDL Chol Calc (NIH): 54 mg/dL (ref 0–99)
Triglycerides: 55 mg/dL (ref 0–149)
VLDL Cholesterol Cal: 12 mg/dL (ref 5–40)

## 2022-02-14 LAB — THYROID PANEL WITH TSH
Free Thyroxine Index: 1.9 (ref 1.2–4.9)
T3 Uptake Ratio: 24 % (ref 24–39)
T4, Total: 7.8 ug/dL (ref 4.5–12.0)
TSH: 1.08 u[IU]/mL (ref 0.450–4.500)

## 2022-02-18 ENCOUNTER — Other Ambulatory Visit: Payer: Self-pay | Admitting: *Deleted

## 2022-02-18 DIAGNOSIS — I701 Atherosclerosis of renal artery: Secondary | ICD-10-CM

## 2022-03-23 ENCOUNTER — Ambulatory Visit: Payer: Medicare Other

## 2022-03-23 ENCOUNTER — Encounter (HOSPITAL_COMMUNITY): Payer: Medicare Other

## 2022-03-31 DIAGNOSIS — H2513 Age-related nuclear cataract, bilateral: Secondary | ICD-10-CM | POA: Diagnosis not present

## 2022-03-31 DIAGNOSIS — N184 Chronic kidney disease, stage 4 (severe): Secondary | ICD-10-CM | POA: Diagnosis not present

## 2022-03-31 DIAGNOSIS — H04221 Epiphora due to insufficient drainage, right lacrimal gland: Secondary | ICD-10-CM | POA: Diagnosis not present

## 2022-03-31 DIAGNOSIS — H04222 Epiphora due to insufficient drainage, left lacrimal gland: Secondary | ICD-10-CM | POA: Diagnosis not present

## 2022-03-31 DIAGNOSIS — H5213 Myopia, bilateral: Secondary | ICD-10-CM | POA: Diagnosis not present

## 2022-04-06 ENCOUNTER — Ambulatory Visit: Payer: Medicare Other

## 2022-04-06 ENCOUNTER — Encounter: Payer: Self-pay | Admitting: Surgery

## 2022-04-06 ENCOUNTER — Ambulatory Visit (INDEPENDENT_AMBULATORY_CARE_PROVIDER_SITE_OTHER): Payer: Medicare Other | Admitting: Surgery

## 2022-04-06 ENCOUNTER — Ambulatory Visit (HOSPITAL_COMMUNITY)
Admission: RE | Admit: 2022-04-06 | Discharge: 2022-04-06 | Disposition: A | Discharge status: Home / Self Care | Payer: Medicare Other | Source: Ambulatory Visit | Attending: Physician Assistant | Admitting: Physician Assistant

## 2022-04-06 VITALS — BP 198/84 | HR 74 | Temp 97.9°F | Resp 20 | Ht 70.0 in | Wt 172.0 lb

## 2022-04-06 DIAGNOSIS — I701 Atherosclerosis of renal artery: Secondary | ICD-10-CM | POA: Insufficient documentation

## 2022-04-06 DIAGNOSIS — I6523 Occlusion and stenosis of bilateral carotid arteries: Secondary | ICD-10-CM | POA: Diagnosis not present

## 2022-04-06 NOTE — Progress Notes (Signed)
[facility]  Patient name: James [name] MRN: [id number] DOB: Jun 30, 1946 Sex: male   REASON FOR VISIT:    Follow up  HISOTRY OF PRESENT ILLNESS:    James [name] is a 76 y.o. male returns today for follow-up.  He is status post left carotid endarterectomy for asymptomatic stenosis in November 2013.  He is status post right carotid endarterectomy in 2008.     In 2009 he underwent a right renal artery stenting secondary to hypertension and an elevated creatinine.  In 2017 he was found to have progressive stenosis within his right renal artery stent and on 12/03/2015 he underwent renal angiography that showed that his right stent was occluded.  His kidney was perfused via an accessory artery.  His creatinine has remained stable.     He denies any new neurologic symptoms such as amaurosis fugax, or numbness or weakness.  His blood pressure has become progressively difficult to control.  His renal function has deteriorated but remained stable.  PAST MEDICAL HISTORY:   Past Medical History:  Diagnosis Date   Arthritis    Carotid artery occlusion    DVT (deep venous thrombosis) ([place]) 2008   GERD (gastroesophageal reflux disease)    takes Protonix daily   Gout    takes Allopurinol daily and Indomethacin prn   H/O renal artery stenosis    H/O: GI bleed    History of blood transfusion    History of colon polyps    History of gastric ulcer    Hyperlipidemia    takes Lipitor daily   Hypertension    takes Metoprolol and Amlodipine daily   Joint pain    Joint swelling      FAMILY HISTORY:   Family History  Problem Relation Age of Onset   Hypertension Mother    Hyperlipidemia Mother    Other Mother        varicose veins   Hyperlipidemia Father    Hypertension Father    Diabetes Father    Clotting disorder Father    Deep vein thrombosis Father    Hypertension Sister    Diabetes Sister     Hyperlipidemia Sister    Varicose Veins Sister    Other Sister        Bleeding problems   Hypertension Brother    Hyperlipidemia Brother     SOCIAL HISTORY:   Social History   Tobacco Use   Smoking status: Former    Packs/day: 1.50    Years: 36.00    Total pack years: 54.00    Types: Cigarettes    Quit date: 12/26/1999    Years since quitting: 22.2   Smokeless tobacco: Former    Types: Chew    Quit date: 12/26/1999  Substance Use Topics   Alcohol use: No    Comment: Quit in 1998     ALLERGIES:   Allergies  Allergen Reactions   Plavix [Clopidogrel Bisulfate] Other (See Comments)    GI Bleed   Uloric [Febuxostat]      CURRENT MEDICATIONS:   Current Outpatient Medications  Medication Sig Dispense Refill   allopurinol (ZYLOPRIM) 100 MG tablet Take 2 tablets (200 mg total) by mouth daily. 180 tablet 3   amLODipine-atorvastatin (CADUET) 10-40 MG tablet Take 1 tablet by mouth daily. 90 tablet 3   aspirin 81 MG tablet Take 81 mg by mouth daily. Once daily     Cholecalciferol (VITAMIN D3) 50 MCG (2000 UT) TABS Take by mouth.  cloNIDine (CATAPRES) 0.2 MG tablet Take 0.2 mg by mouth 2 (two) times daily.     donepezil (ARICEPT) 5 MG tablet Take 1 tablet (5 mg total) by mouth at bedtime. 90 tablet 3   doxazosin (CARDURA) 2 MG tablet Take 2 mg by mouth 2 (two) times daily.     ferrous sulfate 325 (65 FE) MG tablet Take 325 mg by mouth in the morning and at bedtime.     fluticasone (FLONASE) 50 MCG/ACT nasal spray Place 1 spray into both nostrils daily. 16 g 2   furosemide (LASIX) 40 MG tablet 40 mg. Take two tablets by mouth in the morning and one in the evening     memantine (NAMENDA) 10 MG tablet Take 1 tablet (10 mg total) by mouth 2 (two) times daily. 180 tablet 3   potassium chloride (MICRO-K) 10 MEQ CR capsule Take 10 mEq by mouth daily. TAKE 1 TABLET DAILY     No current facility-administered medications for this visit.    REVIEW OF SYSTEMS:   '[X]'$  denotes positive  finding, '[ ]'$  denotes negative finding Cardiac  Comments:  Chest pain or chest pressure:    Shortness of breath upon exertion:    Short of breath when lying flat:    Irregular heart rhythm:        Vascular    Pain in calf, thigh, or hip brought on by ambulation:    Pain in feet at night that wakes you up from your sleep:     Blood clot in your veins:    Leg swelling:         Pulmonary    Oxygen at home:    Productive cough:     Wheezing:         Neurologic    Sudden weakness in arms or legs:     Sudden numbness in arms or legs:     Sudden onset of difficulty speaking or slurred speech:    Temporary loss of vision in one eye:     Problems with dizziness:         Gastrointestinal    Blood in stool:     Vomited blood:         Genitourinary    Burning when urinating:     Blood in urine:        Psychiatric    Major depression:         Hematologic    Bleeding problems:    Problems with blood clotting too easily:        Skin    Rashes or ulcers:        Constitutional    Fever or chills:      PHYSICAL EXAM:   Vitals:   04/06/22 1124 04/06/22 1126  BP: (!) 203/82 (!) 198/84  Pulse: 74   Resp: 20   Temp: 97.9 F (36.6 C)   SpO2: 95%   Weight: 172 lb (78 kg)   Height: '5\' 10"'$  (1.778 m)     GENERAL: The patient is a well-nourished male, in no acute distress. The vital signs are documented above. CARDIAC: There is a regular rate and rhythm.  VASCULAR: No carotid bruits PULMONARY: Non-labored respirations MUSCULOSKELETAL: There are no major deformities or cyanosis. NEUROLOGIC: No focal weakness or paresthesias are detected. SKIN: There are no ulcers or rashes noted. PSYCHIATRIC: The patient has a normal affect.  STUDIES:   I have reviewed the following:  Renal:     Right: Abnormal size for  the right kidney. Normal right Resistive         Index. Normal cortical thickness of right kidney. No evidence         of right renal artery stenosis. RRV flow present.  Unable to         duplicate elevated velocities of 310 cm/s seen in prior exam         in 06/2018, and the artery appears to be open with no         evidence of stenosis.  Left:  Abnormal size for the left kidney. Abnormal left Resistive         Index. Normal cortical thickness of the left kidney. No         evidence of left renal artery stenosis. LRV flow present.         Unable to duplicate elevated velocities of 170/26 cm/s from         prior exam in 02/2021.  Mesenteric:  Normal Celiac artery findings. 70 to 99% stenosis in the superior  mesenteric  artery.      MEDICAL ISSUES:   Carotid: He remains asymptomatic.  He will return in 1 year for surveillance ultrasound  Renal: The left renal artery is widely patent by ultrasound from a study 1 year ago.  Right renal stent is occluded.  He does not have options for revascularization.    Leia Alf, MD, FACS [facility] [phone] Pager [phone]

## 2022-04-09 DIAGNOSIS — I3139 Other pericardial effusion (noninflammatory): Secondary | ICD-10-CM | POA: Diagnosis not present

## 2022-04-09 DIAGNOSIS — N2581 Secondary hyperparathyroidism of renal origin: Secondary | ICD-10-CM | POA: Diagnosis not present

## 2022-04-09 DIAGNOSIS — E876 Hypokalemia: Secondary | ICD-10-CM | POA: Diagnosis not present

## 2022-04-09 DIAGNOSIS — R5381 Other malaise: Secondary | ICD-10-CM | POA: Diagnosis not present

## 2022-04-09 DIAGNOSIS — R600 Localized edema: Secondary | ICD-10-CM | POA: Diagnosis not present

## 2022-04-09 DIAGNOSIS — R5382 Chronic fatigue, unspecified: Secondary | ICD-10-CM | POA: Diagnosis not present

## 2022-04-09 DIAGNOSIS — I701 Atherosclerosis of renal artery: Secondary | ICD-10-CM | POA: Diagnosis not present

## 2022-04-09 DIAGNOSIS — D631 Anemia in chronic kidney disease: Secondary | ICD-10-CM | POA: Diagnosis not present

## 2022-04-09 DIAGNOSIS — I129 Hypertensive chronic kidney disease with stage 1 through stage 4 chronic kidney disease, or unspecified chronic kidney disease: Secondary | ICD-10-CM | POA: Diagnosis not present

## 2022-04-09 DIAGNOSIS — N184 Chronic kidney disease, stage 4 (severe): Secondary | ICD-10-CM | POA: Diagnosis not present

## 2022-04-10 ENCOUNTER — Other Ambulatory Visit: Payer: Self-pay

## 2022-04-10 DIAGNOSIS — I6523 Occlusion and stenosis of bilateral carotid arteries: Secondary | ICD-10-CM

## 2022-04-27 ENCOUNTER — Telehealth: Payer: Self-pay | Admitting: Pulmonary Disease

## 2022-04-27 ENCOUNTER — Encounter: Payer: Self-pay | Admitting: Pulmonary Disease

## 2022-04-27 ENCOUNTER — Ambulatory Visit (INDEPENDENT_AMBULATORY_CARE_PROVIDER_SITE_OTHER): Payer: Medicare Other | Admitting: Pulmonary Disease

## 2022-04-27 VITALS — BP 134/78 | HR 58 | Temp 98.4°F | Ht 70.0 in | Wt 174.4 lb

## 2022-04-27 DIAGNOSIS — I701 Atherosclerosis of renal artery: Secondary | ICD-10-CM | POA: Diagnosis not present

## 2022-04-27 DIAGNOSIS — G4719 Other hypersomnia: Secondary | ICD-10-CM

## 2022-04-27 DIAGNOSIS — G4733 Obstructive sleep apnea (adult) (pediatric): Secondary | ICD-10-CM | POA: Diagnosis not present

## 2022-04-27 NOTE — Patient Instructions (Signed)
Will try to get a copy of your overnight oxygen test from last year  Follow up in 6 months

## 2022-04-27 NOTE — Telephone Encounter (Signed)
ONO with CPAP 05/14/21 >> test time 8 hrs 22 min.  Baseline SpO2 95%, low SpO2 84%.  Spent 2 min 52 sec with SpO2 < 88%.  Please let him know his oxygen test from last year was okay.  He should continue CPAP at night.

## 2022-04-27 NOTE — Telephone Encounter (Signed)
ATC patient.  LMTCB. 

## 2022-04-27 NOTE — Progress Notes (Signed)
James [name], Critical Care, and Sleep Medicine  Chief Complaint  Patient presents with   Follow-up    Still having daytime fatigue with cpap     Past Surgical History:  He  has a past surgical history that includes Renal artery stent (2009); Carotid endarterectomy (2008); small intestine removed (2001); Esophagogastroduodenoscopy; Cholecystectomy (mid 90's); Colonoscopy; Hernia repair; Endarterectomy (06/02/2012); Patch angioplasty (06/02/2012); Colon surgery; and Cardiac catheterization (Right, 12/03/2015).  Past Medical History:  HTN, HLD, PUD, Colon polyp, Renal artery stenosis s/p stent, Gout, GERD, DVT 2008, OA, PAD s/p CEA  Constitutional:  BP 134/78 (BP Location: Left Arm)   Pulse (!) 58   Temp 98.4 F (36.9 C) (Temporal)   Ht '5\' 10"'$  (1.778 m)   Wt 174 lb 6.4 oz (79.1 kg)   SpO2 97% Comment: ra  BMI 25.02 kg/m   Brief Summary:  James [name] is a 76 y.o. male former smoker with obstructive sleep apnea.      Subjective:   He is here with his son.  He reports that he did overnight oximetry with CPAP last year.  Never received report.  Uses CPAP nightly.  No issues with mask fit.  He has repeat Echo scheduled for later this month.  Feels cold all the time.  Gets swelling in his legs if he sits too long.  Uses compression stockings.  He gets tired more easily.  Not having cough, wheeze, or sputum.  Physical Exam:   Appearance - well kempt   ENMT - no sinus tenderness, no oral exudate, no LAN, Mallampati 4 airway, no stridor  Respiratory - equal breath sounds bilaterally, no wheezing or rales  CV - s1s2 regular rate and rhythm, no murmurs  Ext - no clubbing, no edema  Skin - no rashes  Psych - normal mood and affect    Sleep Tests:  HST 01/14/16 >> AHI 25.9, SaO2 low 77% Auto CPAP 03/24/22 to 04/22/22 >> used on 29 of 30 nights with average 8 hrs 38 min.  Average AHI 5.7 with median CPAP 9 and 95 th percentile CPAP 12 cm H2O  Cardiac Tests:   Echo 10/27/21 >> EF 55 to 60%, grade 2 DD, large pericardial effusion  Social History:  He  reports that he quit smoking about 22 years ago. His smoking use included cigarettes. He has a 54.00 pack-year smoking history. He quit smokeless tobacco use about 22 years ago.  His smokeless tobacco use included chew. He reports that he does not drink alcohol and does not use drugs.  Family History:  His family history includes Clotting disorder in his father; Deep vein thrombosis in his father; Diabetes in his father and sister; Hyperlipidemia in his brother, father, mother, and sister; Hypertension in his brother, father, mother, and sister; Other in his mother and sister; Varicose Veins in his sister.     Assessment/Plan:    Obstructive sleep apnea with excess daytime sleepiness. - he is compliant with CPAP and reports benefit from therapy - he uses [employer] for his DME - will change his auto CPAP to 7 - 17 cm H2O and arrange for overnight oximetry on CPAP - he should be eligible for a new CPAP machine in 2024 - will see if we can get his previous overnight oximetry with CPAP; if this can't be located we would then need to reschedule this - he is on several medications that could contribute to daytime sleepiness (clonidine, cardura, aricept), or this could be related this pericardial effusion  Pericardial effusion. - followed by Dr. Marijo File with [facility] - repeat Echo schedule for later this month  Renal artery stenosis. - followed by Dr. Harold Barban with vascular surgery  CKD 3b. - followed by Dr. Lawson Radar with nephrology  Time Spent Involved in Patient Care on Day of Examination:  27 minutes  Follow up:   Patient Instructions  Will try to get a copy of your overnight oxygen test from last year  Follow up in 6 months  Medication List:   Allergies as of 04/27/2022       Reactions   Plavix [clopidogrel Bisulfate] Other (See Comments)   GI Bleed   Uloric  [febuxostat]         Medication List        Accurate as of April 27, 2022  9:52 AM. If you have any questions, ask your nurse or doctor.          allopurinol 100 MG tablet Commonly known as: ZYLOPRIM Take 2 tablets (200 mg total) by mouth daily.   amLODipine-atorvastatin 10-40 MG tablet Commonly known as: CADUET Take 1 tablet by mouth daily.   aspirin 81 MG tablet Take 81 mg by mouth daily. Once daily   cloNIDine 0.2 MG tablet Commonly known as: CATAPRES Take 0.3 mg by mouth 3 (three) times daily.   donepezil 5 MG tablet Commonly known as: ARICEPT Take 1 tablet (5 mg total) by mouth at bedtime.   doxazosin 2 MG tablet Commonly known as: CARDURA Take 2 mg by mouth 2 (two) times daily.   ferrous sulfate 325 (65 FE) MG tablet Take 325 mg by mouth in the morning and at bedtime.   fluticasone 50 MCG/ACT nasal spray Commonly known as: FLONASE Place 1 spray into both nostrils daily.   furosemide 40 MG tablet Commonly known as: LASIX Take 80 mg by mouth daily. Take two tablets by mouth in the morning and one in the evening   memantine 10 MG tablet Commonly known as: NAMENDA Take 1 tablet (10 mg total) by mouth 2 (two) times daily.   potassium chloride 10 MEQ CR capsule Commonly known as: MICRO-K Take 10 mEq by mouth daily. TAKE 1 TABLET DAILY   Vitamin D3 50 MCG (2000 UT) Tabs Take by mouth.        Signature:  Chesley Mires, MD [place] Pager - [phone] 04/27/2022, 9:52 AM

## 2022-04-29 NOTE — Telephone Encounter (Signed)
Spoke with patient  regarding ONO results. They verbalized understanding. No further questions.  Nothing further needed at this time.  

## 2022-05-04 ENCOUNTER — Ambulatory Visit (HOSPITAL_COMMUNITY): Payer: Medicare Other | Attending: Cardiology

## 2022-05-04 DIAGNOSIS — I3139 Other pericardial effusion (noninflammatory): Secondary | ICD-10-CM | POA: Diagnosis not present

## 2022-05-04 LAB — ECHOCARDIOGRAM COMPLETE
Area-P 1/2: 4.21 cm2
S' Lateral: 3.3 cm

## 2022-05-12 ENCOUNTER — Encounter: Payer: Self-pay | Admitting: *Deleted

## 2022-05-14 DIAGNOSIS — N401 Enlarged prostate with lower urinary tract symptoms: Secondary | ICD-10-CM | POA: Diagnosis not present

## 2022-05-14 DIAGNOSIS — D294 Benign neoplasm of scrotum: Secondary | ICD-10-CM | POA: Diagnosis not present

## 2022-05-14 DIAGNOSIS — R3915 Urgency of urination: Secondary | ICD-10-CM | POA: Diagnosis not present

## 2022-05-16 NOTE — Progress Notes (Unsigned)
Cardiology Office Note   Date:  05/19/2022   ID:  SHELLIE GOETTL, DOB June 06, 1946, MRN [id number]  PCP:  [name], [name], MD  Cardiologist:   Minus Breeding, MD Referring:  [name], [name], MD    Chief Complaint  Patient presents with   Fatigue     History of Present Illness: James [name] is a 76 y.o. male who presents for evaluation of fatigue and difficult to control hypertension and a large pericardial effusion.   It has been unchanged on multiple echocardiograms.     He has no new complaints since I saw him last.  He is seen his nephrologist has been adjusting his medicines and increased his clonidine to 0.3 mg 3 times daily.  He has had no new shortness of breath, PND or orthopnea.  He had no new palpitations, presyncope or syncope.  He had some blood pressures in the 888B systolic but for the most part it runs around 169 systolic with diastolics that are controlled.  He has some chronic lower extremity swelling that is unchanged from previous.  He does not like to wear his compression stockings.  He is not having any new weight gain.  Past Medical History:  Diagnosis Date   Arthritis    Carotid artery occlusion    DVT (deep venous thrombosis) ([place]) 2008   GERD (gastroesophageal reflux disease)    takes Protonix daily   Gout    takes Allopurinol daily and Indomethacin prn   H/O renal artery stenosis    H/O: GI bleed    History of blood transfusion    History of colon polyps    History of gastric ulcer    Hyperlipidemia    takes Lipitor daily   Hypertension    takes Metoprolol and Amlodipine daily   Joint pain    Joint swelling     Past Surgical History:  Procedure Laterality Date   CAROTID ENDARTERECTOMY  2008   right CEA   CHOLECYSTECTOMY  mid 90's   COLON SURGERY     COLONOSCOPY     ENDARTERECTOMY  06/02/2012   Procedure: ENDARTERECTOMY CAROTID;  Surgeon: Serafina Mitchell, MD;  Location: [place];  Service: Vascular;  Laterality: Left;    ESOPHAGOGASTRODUODENOSCOPY     HERNIA REPAIR     umbilical hernia   PATCH ANGIOPLASTY  06/02/2012   Procedure: PATCH ANGIOPLASTY;  Surgeon: Serafina Mitchell, MD;  Location: [facility];  Service: Vascular;  Laterality: Left;  using Vascu-Guard Patch   PERIPHERAL VASCULAR CATHETERIZATION Right 12/03/2015   Procedure: Peripheral Vascular Balloon Angioplasty;  Surgeon: Serafina Mitchell, MD;  Location: [facility];  Service: Cardiovascular;  Laterality: Right;  RENAL unsuccessful   RENAL ARTERY STENT  2009   Right renal artery stenting-  Right Kidney   small intestine removed  2001   d/t random gi bleeding     Current Outpatient Medications  Medication Sig Dispense Refill   allopurinol (ZYLOPRIM) 100 MG tablet Take 2 tablets (200 mg total) by mouth daily. 180 tablet 3   amLODipine-atorvastatin (CADUET) 10-40 MG tablet Take 1 tablet by mouth daily. 90 tablet 3   aspirin 81 MG tablet Take 81 mg by mouth daily. Once daily     Cholecalciferol (VITAMIN D3) 50 MCG (2000 UT) TABS Take by mouth.     cloNIDine (CATAPRES) 0.2 MG tablet Take 0.3 mg by mouth 3 (three) times daily.     donepezil (ARICEPT) 5 MG tablet Take  1 tablet (5 mg total) by mouth at bedtime. 90 tablet 3   doxazosin (CARDURA) 2 MG tablet Take 2 mg by mouth 2 (two) times daily.     ferrous sulfate 325 (65 FE) MG tablet Take 325 mg by mouth in the morning and at bedtime.     fluticasone (FLONASE) 50 MCG/ACT nasal spray Place 1 spray into both nostrils daily. 16 g 2   furosemide (LASIX) 40 MG tablet Take 80 mg by mouth daily. Take two tablets by mouth in the morning and one in the evening     potassium chloride (MICRO-K) 10 MEQ CR capsule Take 10 mEq by mouth daily. TAKE 1 TABLET DAILY     No current facility-administered medications for this visit.    Allergies:   Plavix [clopidogrel bisulfate] and Uloric [febuxostat]    ROS:  Please see the history of present illness.   Otherwise, review of systems are for none .   All other systems  are reviewed and negative.    PHYSICAL EXAM: VS:  BP (!) 172/78   Pulse (!) 48   Ht '5\' 10"'$  (1.778 m)   Wt 175 lb 9.6 oz (79.7 kg)   SpO2 98%   BMI 25.20 kg/m  , BMI Body mass index is 25.2 kg/m. GENERAL:  Well appearing NECK:  No jugular venous distention, waveform within normal limits, carotid upstroke brisk and symmetric, no bruits, no thyromegaly LUNGS:  Clear to auscultation bilaterally CHEST:  Unremarkable HEART:  PMI not displaced or sustained,S1 and S2 within normal limits, no S3, no S4, no clicks, no rubs, no murmurs ABD:  Flat, positive bowel sounds normal in frequency in pitch, no bruits, no rebound, no guarding, no midline pulsatile mass, no hepatomegaly, no splenomegaly EXT:  2 plus pulses throughout, moderate bilateral lower extremity edema, no cyanosis no clubbing  EKG:  EKG is  ordered today. Sinus bradycardia, rate 48, first-degree AV block, poor anterior R wave progression, no acute ST-T wave changes.  Recent Labs: 02/13/2022: ALT 17; BUN 36; Creatinine, Ser 2.79; Hemoglobin 11.4; Platelets 264; Potassium 3.8; Sodium 137; TSH 1.080    Lipid Panel    Component Value Date/Time   CHOL 127 02/13/2022 1626   TRIG 55 02/13/2022 1626   HDL 61 02/13/2022 1626   CHOLHDL 2.1 02/13/2022 1626   LDLCALC 54 02/13/2022 1626      Wt Readings from Last 3 Encounters:  05/19/22 175 lb 9.6 oz (79.7 kg)  04/27/22 174 lb 6.4 oz (79.1 kg)  04/06/22 172 lb (78 kg)      Other studies Reviewed: Additional studies/ records that were reviewed today include: Echocardiogram Review of the above records demonstrates:  NA    ASSESSMENT AND PLAN:  PERICARDIAL EFFUSION:     We continue to have long conversations.  He has had no symptoms related to this.  We will follow this again with an echocardiogram in 6 months.  HTN:  His BP is slightly elevated but this is being managed in the context of treating his renal insufficiency and followed by his nephrologist.   FATIGUE:    This is  chronic.  No change in therapy.   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  His left has moderate stenosis and he has follow-up with Dr. Trula Slade  CAROTID STENOSIS:    This is mild .  No further imaging.  HTN:    His blood pressure is managed as above by nephrology.  CKD:    Creat is 2.79 at the last meeting that I have.  His nephrologist is following this closely.  Current medicines are reviewed at length with the patient today.  The patient does not have concerns regarding medicines.  The following changes have been made:    None  Labs/ tests ordered today include:   Orders Placed This Encounter  Procedures   EKG 12-Lead   ECHOCARDIOGRAM COMPLETE    Disposition:   FU with me in me 6 months   Signed, Minus Breeding, MD  05/19/2022 5:17 PM    [place]

## 2022-05-19 ENCOUNTER — Ambulatory Visit: Payer: Medicare Other | Attending: Cardiology | Admitting: Cardiology

## 2022-05-19 ENCOUNTER — Encounter: Payer: Self-pay | Admitting: Cardiology

## 2022-05-19 VITALS — BP 172/78 | HR 48 | Ht 70.0 in | Wt 175.6 lb

## 2022-05-19 DIAGNOSIS — I701 Atherosclerosis of renal artery: Secondary | ICD-10-CM | POA: Diagnosis not present

## 2022-05-19 DIAGNOSIS — I10 Essential (primary) hypertension: Secondary | ICD-10-CM | POA: Insufficient documentation

## 2022-05-19 DIAGNOSIS — I3139 Other pericardial effusion (noninflammatory): Secondary | ICD-10-CM | POA: Insufficient documentation

## 2022-05-19 NOTE — Patient Instructions (Addendum)
Medication Instructions:  No changes *If you need a refill on your cardiac medications before your next appointment, please call your pharmacy*   Lab Work: None ordered If you have labs (blood work) drawn today and your tests are completely normal, you will receive your results only by: [place] (if you have MyChart) OR A paper copy in the mail If you have any lab test that is abnormal or we need to change your treatment, we will call you to review the results.   Testing/Procedures: Your physician has requested that you have an echocardiogram in 6 months. Echocardiography is a painless test that uses sound waves to create images of your heart. It provides your doctor with information about the size and shape of your heart and how well your heart's chambers and valves are working. You may receive an ultrasound enhancing agent through an IV if needed to better visualize your heart during the echo.This procedure takes approximately one hour. There are no restrictions for this procedure. This will take place at the [address], [address].     Follow-Up: At [facility], you and your health needs are our priority.  As part of our continuing mission to provide you with exceptional heart care, we have created designated Provider Care Teams.  These Care Teams include your primary Cardiologist (physician) and Advanced Practice Providers (APPs -  Physician Assistants and Nurse Practitioners) who all work together to provide you with the care you need, when you need it.  We recommend signing up for the patient portal called "MyChart".  Sign up information is provided on this After Visit Summary.  MyChart is used to connect with patients for Virtual Visits (Telemedicine).  Patients are able to view lab/test results, encounter notes, upcoming appointments, etc.  Non-urgent messages can be sent to your provider as well.   To learn more about what you can do with MyChart, go to  [email or web address].    Your next appointment:   6 month(s) after the echo  The format for your next appointment:   In Person  Provider:   Minus Breeding, MD

## 2022-05-28 ENCOUNTER — Ambulatory Visit (INDEPENDENT_AMBULATORY_CARE_PROVIDER_SITE_OTHER): Payer: Medicare Other | Admitting: *Deleted

## 2022-05-28 DIAGNOSIS — Z23 Encounter for immunization: Secondary | ICD-10-CM | POA: Diagnosis not present

## 2022-07-28 DIAGNOSIS — N189 Chronic kidney disease, unspecified: Secondary | ICD-10-CM | POA: Diagnosis not present

## 2022-07-28 DIAGNOSIS — N184 Chronic kidney disease, stage 4 (severe): Secondary | ICD-10-CM | POA: Diagnosis not present

## 2022-07-30 DIAGNOSIS — D294 Benign neoplasm of scrotum: Secondary | ICD-10-CM | POA: Diagnosis not present

## 2022-08-05 DIAGNOSIS — E876 Hypokalemia: Secondary | ICD-10-CM | POA: Diagnosis not present

## 2022-08-05 DIAGNOSIS — I3139 Other pericardial effusion (noninflammatory): Secondary | ICD-10-CM | POA: Diagnosis not present

## 2022-08-05 DIAGNOSIS — I129 Hypertensive chronic kidney disease with stage 1 through stage 4 chronic kidney disease, or unspecified chronic kidney disease: Secondary | ICD-10-CM | POA: Diagnosis not present

## 2022-08-05 DIAGNOSIS — R5381 Other malaise: Secondary | ICD-10-CM | POA: Diagnosis not present

## 2022-08-05 DIAGNOSIS — N2581 Secondary hyperparathyroidism of renal origin: Secondary | ICD-10-CM | POA: Diagnosis not present

## 2022-08-05 DIAGNOSIS — R600 Localized edema: Secondary | ICD-10-CM | POA: Diagnosis not present

## 2022-08-05 DIAGNOSIS — D631 Anemia in chronic kidney disease: Secondary | ICD-10-CM | POA: Diagnosis not present

## 2022-08-05 DIAGNOSIS — I701 Atherosclerosis of renal artery: Secondary | ICD-10-CM | POA: Diagnosis not present

## 2022-08-05 DIAGNOSIS — R5382 Chronic fatigue, unspecified: Secondary | ICD-10-CM | POA: Diagnosis not present

## 2022-08-05 DIAGNOSIS — N184 Chronic kidney disease, stage 4 (severe): Secondary | ICD-10-CM | POA: Diagnosis not present

## 2022-08-21 ENCOUNTER — Ambulatory Visit (INDEPENDENT_AMBULATORY_CARE_PROVIDER_SITE_OTHER): Payer: Medicare Other | Admitting: Family Medicine

## 2022-08-21 ENCOUNTER — Encounter: Payer: Self-pay | Admitting: Family Medicine

## 2022-08-21 VITALS — BP 174/64 | HR 50 | Ht 70.0 in | Wt 173.0 lb

## 2022-08-21 DIAGNOSIS — R7309 Other abnormal glucose: Secondary | ICD-10-CM

## 2022-08-21 DIAGNOSIS — F01B3 Vascular dementia, moderate, with mood disturbance: Secondary | ICD-10-CM

## 2022-08-21 DIAGNOSIS — R739 Hyperglycemia, unspecified: Secondary | ICD-10-CM

## 2022-08-21 DIAGNOSIS — E785 Hyperlipidemia, unspecified: Secondary | ICD-10-CM | POA: Diagnosis not present

## 2022-08-21 DIAGNOSIS — I129 Hypertensive chronic kidney disease with stage 1 through stage 4 chronic kidney disease, or unspecified chronic kidney disease: Secondary | ICD-10-CM | POA: Diagnosis not present

## 2022-08-21 DIAGNOSIS — N1832 Chronic kidney disease, stage 3b: Secondary | ICD-10-CM | POA: Diagnosis not present

## 2022-08-21 DIAGNOSIS — I10 Essential (primary) hypertension: Secondary | ICD-10-CM

## 2022-08-21 MED ORDER — SERTRALINE HCL 50 MG PO TABS: 50.0000 mg | ORAL_TABLET | Freq: Every day | ORAL | 3 refills | Status: DC

## 2022-08-21 NOTE — Progress Notes (Signed)
BP (!) 174/64   Pulse (!) 50   Ht '5\' 10"'$  (1.778 m)   Wt 173 lb (78.5 kg)   SpO2 97%   BMI 24.82 kg/m    Subjective:   Patient ID: James [name], male    DOB: July 16, 1946, 77 y.o.   MRN: [id number]  HPI: James [name] is a 77 y.o. male presenting on 08/21/2022 for Medical Management of Chronic Issues, Hypertension, and Hyperlipidemia   HPI Hypertension Patient is currently on clonidine and amlodipine and Cardura and Lasix, and their blood pressure today is 174/64. Patient denies any lightheadedness or dizziness. Patient denies headaches, blurred vision, chest pains, shortness of breath, or weakness. Denies any side effects from medication and is content with current medication.   Hyperlipidemia Patient is coming in for recheck of his hyperlipidemia. The patient is currently taking atorvastatin. They deny any issues with myalgias or history of liver damage from it. They deny any focal numbness or weakness or chest pain.   CKD 3 Patient has CKD 3, likely due to hypertension.  He has seen urology and nephrology, has tried a stent for renal artery stenosis and failed and have attempted to manage his blood pressure as best as possible.  With his memory starting to fade more they have been concerned about his mood worsening to some extent.  There are send that he gets more agitated more irritated easily with his memory being worsened at times.  His son is here with him and giving most of the history.  Relevant past medical, surgical, family and social history reviewed and updated as indicated. Interim medical history since our last visit reviewed. Allergies and medications reviewed and updated.  Review of Systems  Constitutional:  Negative for chills and fever.  Eyes:  Negative for discharge.  Respiratory:  Negative for shortness of breath and wheezing.   Cardiovascular:  Negative for chest pain and leg swelling.  Musculoskeletal:  Negative for back pain and gait problem.   Skin:  Negative for rash.  Neurological:  Negative for dizziness, weakness and numbness.  All other systems reviewed and are negative.   Per HPI unless specifically indicated above   Allergies as of 08/21/2022       Reactions   Plavix [clopidogrel Bisulfate] Other (See Comments)   GI Bleed   Uloric [febuxostat]         Medication List        Accurate as of August 21, 2022  4:27 PM. If you have any questions, ask your nurse or doctor.          allopurinol 100 MG tablet Commonly known as: ZYLOPRIM Take 2 tablets (200 mg total) by mouth daily.   amLODipine-atorvastatin 10-40 MG tablet Commonly known as: CADUET Take 1 tablet by mouth daily.   aspirin 81 MG tablet Take 81 mg by mouth daily. Once daily   cloNIDine 0.2 MG tablet Commonly known as: CATAPRES Take 0.3 mg by mouth 3 (three) times daily.   donepezil 5 MG tablet Commonly known as: ARICEPT Take 1 tablet (5 mg total) by mouth at bedtime.   doxazosin 2 MG tablet Commonly known as: CARDURA Take 2 mg by mouth 2 (two) times daily.   ferrous sulfate 325 (65 FE) MG tablet Take 325 mg by mouth in the morning and at bedtime.   fluticasone 50 MCG/ACT nasal spray Commonly known as: FLONASE Place 1 spray into both nostrils daily.   furosemide 40 MG tablet Commonly known as: LASIX Take 80  mg by mouth daily. Take two tablets by mouth in the morning and one in the evening   potassium chloride 10 MEQ CR capsule Commonly known as: MICRO-K Take 10 mEq by mouth daily. TAKE 1 TABLET DAILY   sertraline 50 MG tablet Commonly known as: Zoloft Take 1 tablet (50 mg total) by mouth daily. Started by: Worthy Rancher, MD   Vitamin D3 50 MCG (2000 UT) Tabs Take by mouth.         Objective:   BP (!) 174/64   Pulse (!) 50   Ht '5\' 10"'$  (1.778 m)   Wt 173 lb (78.5 kg)   SpO2 97%   BMI 24.82 kg/m   Wt Readings from Last 3 Encounters:  08/21/22 173 lb (78.5 kg)  05/19/22 175 lb 9.6 oz (79.7 kg)   04/27/22 174 lb 6.4 oz (79.1 kg)    Physical Exam Vitals and nursing note reviewed.  Constitutional:      General: He is not in acute distress.    Appearance: He is well-developed. He is not diaphoretic.  Eyes:     General: No scleral icterus.    Conjunctiva/sclera: Conjunctivae normal.  Neck:     Thyroid: No thyromegaly.  Cardiovascular:     Rate and Rhythm: Normal rate and regular rhythm.     Heart sounds: Normal heart sounds. No murmur heard. Pulmonary:     Effort: Pulmonary effort is normal. No respiratory distress.     Breath sounds: Normal breath sounds. No wheezing.  Musculoskeletal:        General: Normal range of motion.     Cervical back: Neck supple.  Lymphadenopathy:     Cervical: No cervical adenopathy.  Skin:    General: Skin is warm and dry.     Findings: No rash.  Neurological:     Mental Status: He is alert and oriented to person, place, and time.     Coordination: Coordination normal.  Psychiatric:        Behavior: Behavior normal.       Assessment & Plan:   Problem List Items Addressed This Visit       Cardiovascular and Mediastinum   Hypertension - Primary   Relevant Orders   CBC with Differential/Platelet   CMP14+EGFR   Lipid panel   TSH     Genitourinary   Chronic kidney disease (CKD), stage III (moderate) (HCC)   Relevant Orders   CBC with Differential/Platelet   CMP14+EGFR   Lipid panel   TSH     Other   Hyperlipidemia LDL goal <100   Relevant Orders   CBC with Differential/Platelet   CMP14+EGFR   Lipid panel   TSH   Other Visit Diagnoses     Elevated random blood glucose level       Relevant Orders   Bayer DCA Hb A1c Waived   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  Blood pressure elevated but it has been chronically and I do not know anything  else we can do, he does have a nephrologist and cardiologist who feel like it is in the same boat. Follow up plan: Return in about 6 months (around 02/19/2023), or if symptoms worsen or fail to improve, for Hypertension and CKD and mood disturbance.  Counseling provided  for all of the vaccine components Orders Placed This Encounter  Procedures   CBC with Differential/Platelet   CMP14+EGFR   Lipid panel   TSH   Bayer DCA Hb A1c [place], MD [facility] 08/21/2022, 4:27 PM

## 2022-08-22 LAB — CMP14+EGFR
ALT: 13 IU/L (ref 0–44)
AST: 17 IU/L (ref 0–40)
Albumin/Globulin Ratio: 1.6 (ref 1.2–2.2)
Albumin: 4.1 g/dL (ref 3.8–4.8)
Alkaline Phosphatase: 179 IU/L — ABNORMAL HIGH (ref 44–121)
BUN/Creatinine Ratio: 11 (ref 10–24)
BUN: 29 mg/dL — ABNORMAL HIGH (ref 8–27)
Bilirubin Total: 0.6 mg/dL (ref 0.0–1.2)
CO2: 23 mmol/L (ref 20–29)
Calcium: 9.1 mg/dL (ref 8.6–10.2)
Chloride: 97 mmol/L (ref 96–106)
Creatinine, Ser: 2.7 mg/dL — ABNORMAL HIGH (ref 0.76–1.27)
Globulin, Total: 2.5 g/dL (ref 1.5–4.5)
Glucose: 108 mg/dL — ABNORMAL HIGH (ref 70–99)
Potassium: 3.9 mmol/L (ref 3.5–5.2)
Sodium: 140 mmol/L (ref 134–144)
Total Protein: 6.6 g/dL (ref 6.0–8.5)
eGFR: 24 mL/min/{1.73_m2} — ABNORMAL LOW (ref >59)

## 2022-08-22 LAB — CBC WITH DIFFERENTIAL/PLATELET
Basophils Absolute: 0 10*3/uL (ref 0.0–0.2)
Basos: 1 %
EOS (ABSOLUTE): 0.1 10*3/uL (ref 0.0–0.4)
Eos: 2 %
Hematocrit: 32.7 % — ABNORMAL LOW (ref 37.5–51.0)
Hemoglobin: 10.8 g/dL — ABNORMAL LOW (ref 13.0–17.7)
Immature Grans (Abs): 0 10*3/uL (ref 0.0–0.1)
Immature Granulocytes: 0 %
Lymphocytes Absolute: 1.1 10*3/uL (ref 0.7–3.1)
Lymphs: 13 %
MCH: 28.1 pg (ref 26.6–33.0)
MCHC: 33 g/dL (ref 31.5–35.7)
MCV: 85 fL (ref 79–97)
Monocytes Absolute: 0.5 10*3/uL (ref 0.1–0.9)
Monocytes: 6 %
Neutrophils Absolute: 6.4 10*3/uL (ref 1.4–7.0)
Neutrophils: 78 %
Platelets: 243 10*3/uL (ref 150–450)
RBC: 3.84 x10E6/uL — ABNORMAL LOW (ref 4.14–5.80)
RDW: 14.5 % (ref 11.6–15.4)
WBC: 8.1 10*3/uL (ref 3.4–10.8)

## 2022-08-22 LAB — LIPID PANEL
Chol/HDL Ratio: 1.9 ratio (ref 0.0–5.0)
Cholesterol, Total: 131 mg/dL (ref 100–199)
HDL: 68 mg/dL (ref >39)
LDL Chol Calc (NIH): 47 mg/dL (ref 0–99)
Triglycerides: 82 mg/dL (ref 0–149)
VLDL Cholesterol Cal: 16 mg/dL (ref 5–40)

## 2022-08-22 LAB — TSH: TSH: 0.813 u[IU]/mL (ref 0.450–4.500)

## 2022-08-24 LAB — BAYER DCA HB A1C WAIVED: HB A1C (BAYER DCA - WAIVED): 5.4 % (ref 4.8–5.6)

## 2022-08-30 ENCOUNTER — Other Ambulatory Visit: Payer: Self-pay

## 2022-08-30 ENCOUNTER — Emergency Department (HOSPITAL_COMMUNITY): Payer: Medicare Other

## 2022-08-30 ENCOUNTER — Encounter (HOSPITAL_COMMUNITY): Payer: Self-pay | Admitting: Emergency Medicine

## 2022-08-30 ENCOUNTER — Observation Stay (HOSPITAL_COMMUNITY)
Admission: EM | Admit: 2022-08-30 | Discharge: 2022-08-30 | Discharge status: Against Medical Advice | Payer: Medicare Other | Attending: Internal Medicine | Admitting: Internal Medicine

## 2022-08-30 DIAGNOSIS — R918 Other nonspecific abnormal finding of lung field: Secondary | ICD-10-CM | POA: Diagnosis not present

## 2022-08-30 DIAGNOSIS — D72829 Elevated white blood cell count, unspecified: Secondary | ICD-10-CM | POA: Diagnosis not present

## 2022-08-30 DIAGNOSIS — K5730 Diverticulosis of large intestine without perforation or abscess without bleeding: Secondary | ICD-10-CM | POA: Insufficient documentation

## 2022-08-30 DIAGNOSIS — K4090 Unilateral inguinal hernia, without obstruction or gangrene, not specified as recurrent: Secondary | ICD-10-CM | POA: Diagnosis not present

## 2022-08-30 DIAGNOSIS — Z79899 Other long term (current) drug therapy: Secondary | ICD-10-CM | POA: Insufficient documentation

## 2022-08-30 DIAGNOSIS — R0789 Other chest pain: Secondary | ICD-10-CM | POA: Diagnosis not present

## 2022-08-30 DIAGNOSIS — I3139 Other pericardial effusion (noninflammatory): Principal | ICD-10-CM | POA: Diagnosis present

## 2022-08-30 DIAGNOSIS — Z7982 Long term (current) use of aspirin: Secondary | ICD-10-CM | POA: Diagnosis not present

## 2022-08-30 DIAGNOSIS — E876 Hypokalemia: Secondary | ICD-10-CM | POA: Diagnosis present

## 2022-08-30 DIAGNOSIS — N400 Enlarged prostate without lower urinary tract symptoms: Secondary | ICD-10-CM | POA: Diagnosis not present

## 2022-08-30 DIAGNOSIS — N184 Chronic kidney disease, stage 4 (severe): Secondary | ICD-10-CM | POA: Diagnosis not present

## 2022-08-30 DIAGNOSIS — R079 Chest pain, unspecified: Secondary | ICD-10-CM | POA: Diagnosis present

## 2022-08-30 DIAGNOSIS — I10 Essential (primary) hypertension: Secondary | ICD-10-CM | POA: Diagnosis present

## 2022-08-30 DIAGNOSIS — R791 Abnormal coagulation profile: Secondary | ICD-10-CM | POA: Diagnosis not present

## 2022-08-30 DIAGNOSIS — M109 Gout, unspecified: Secondary | ICD-10-CM | POA: Diagnosis present

## 2022-08-30 DIAGNOSIS — R0602 Shortness of breath: Secondary | ICD-10-CM | POA: Diagnosis not present

## 2022-08-30 DIAGNOSIS — I700 Atherosclerosis of aorta: Secondary | ICD-10-CM | POA: Diagnosis not present

## 2022-08-30 DIAGNOSIS — J90 Pleural effusion, not elsewhere classified: Secondary | ICD-10-CM | POA: Diagnosis not present

## 2022-08-30 DIAGNOSIS — J9811 Atelectasis: Secondary | ICD-10-CM | POA: Diagnosis not present

## 2022-08-30 DIAGNOSIS — R2243 Localized swelling, mass and lump, lower limb, bilateral: Secondary | ICD-10-CM | POA: Diagnosis not present

## 2022-08-30 DIAGNOSIS — D649 Anemia, unspecified: Secondary | ICD-10-CM | POA: Diagnosis not present

## 2022-08-30 DIAGNOSIS — G4733 Obstructive sleep apnea (adult) (pediatric): Secondary | ICD-10-CM

## 2022-08-30 DIAGNOSIS — I517 Cardiomegaly: Secondary | ICD-10-CM | POA: Insufficient documentation

## 2022-08-30 DIAGNOSIS — N183 Chronic kidney disease, stage 3 unspecified: Secondary | ICD-10-CM | POA: Diagnosis present

## 2022-08-30 DIAGNOSIS — J449 Chronic obstructive pulmonary disease, unspecified: Secondary | ICD-10-CM | POA: Diagnosis not present

## 2022-08-30 DIAGNOSIS — K8689 Other specified diseases of pancreas: Secondary | ICD-10-CM | POA: Diagnosis not present

## 2022-08-30 DIAGNOSIS — R7989 Other specified abnormal findings of blood chemistry: Secondary | ICD-10-CM | POA: Diagnosis not present

## 2022-08-30 LAB — TROPONIN I (HIGH SENSITIVITY)
Troponin I (High Sensitivity): 8 ng/L (ref <18)
Troponin I (High Sensitivity): 9 ng/L (ref <18)

## 2022-08-30 LAB — BASIC METABOLIC PANEL
Anion gap: 13 (ref 5–15)
BUN: 33 mg/dL — ABNORMAL HIGH (ref 8–23)
CO2: 24 mmol/L (ref 22–32)
Calcium: 8.4 mg/dL — ABNORMAL LOW (ref 8.9–10.3)
Chloride: 98 mmol/L (ref 98–111)
Creatinine, Ser: 2.51 mg/dL — ABNORMAL HIGH (ref 0.61–1.24)
GFR, Estimated: 26 mL/min — ABNORMAL LOW (ref >60)
Glucose, Bld: 151 mg/dL — ABNORMAL HIGH (ref 70–99)
Potassium: 2.9 mmol/L — ABNORMAL LOW (ref 3.5–5.1)
Sodium: 135 mmol/L (ref 135–145)

## 2022-08-30 LAB — CBC
HCT: 35.3 % — ABNORMAL LOW (ref 39.0–52.0)
Hemoglobin: 11.7 g/dL — ABNORMAL LOW (ref 13.0–17.0)
MCH: 28.1 pg (ref 26.0–34.0)
MCHC: 33.1 g/dL (ref 30.0–36.0)
MCV: 84.9 fL (ref 80.0–100.0)
Platelets: 255 10*3/uL (ref 150–400)
RBC: 4.16 MIL/uL — ABNORMAL LOW (ref 4.22–5.81)
RDW: 14.7 % (ref 11.5–15.5)
WBC: 11.4 10*3/uL — ABNORMAL HIGH (ref 4.0–10.5)
nRBC: 0 % (ref 0.0–0.2)

## 2022-08-30 LAB — LIPASE, BLOOD: Lipase: 46 U/L (ref 11–51)

## 2022-08-30 LAB — D-DIMER, QUANTITATIVE: D-Dimer, Quant: 1.75 ug/mL-FEU — ABNORMAL HIGH (ref 0.00–0.50)

## 2022-08-30 LAB — BRAIN NATRIURETIC PEPTIDE: B Natriuretic Peptide: 340 pg/mL — ABNORMAL HIGH (ref 0.0–100.0)

## 2022-08-30 LAB — HEPATIC FUNCTION PANEL
ALT: 14 U/L (ref 0–44)
AST: 19 U/L (ref 15–41)
Albumin: 3.7 g/dL (ref 3.5–5.0)
Alkaline Phosphatase: 157 U/L — ABNORMAL HIGH (ref 38–126)
Bilirubin, Direct: 0.2 mg/dL (ref 0.0–0.2)
Indirect Bilirubin: 0.8 mg/dL (ref 0.3–0.9)
Total Bilirubin: 1 mg/dL (ref 0.3–1.2)
Total Protein: 6.8 g/dL (ref 6.5–8.1)

## 2022-08-30 LAB — CBG MONITORING, ED: Glucose-Capillary: 138 mg/dL — ABNORMAL HIGH (ref 70–99)

## 2022-08-30 LAB — MAGNESIUM: Magnesium: 2 mg/dL (ref 1.7–2.4)

## 2022-08-30 LAB — SEDIMENTATION RATE: Sed Rate: 18 mm/hr — ABNORMAL HIGH (ref 0–16)

## 2022-08-30 MED ORDER — FENTANYL CITRATE (PF) 100 MCG/2ML IJ SOLN
100.0000 ug | Freq: Once | INTRAMUSCULAR | Status: AC
  Administered 2022-08-30: 100 ug via INTRAVENOUS
  Filled 2022-08-30: qty 2

## 2022-08-30 MED ORDER — CLONIDINE HCL 0.2 MG PO TABS: 0.3000 mg | ORAL_TABLET | Freq: Three times a day (TID) | ORAL | Status: DC

## 2022-08-30 MED ORDER — NITROGLYCERIN 2 % TD OINT
1.0000 [in_us] | TOPICAL_OINTMENT | Freq: Once | TRANSDERMAL | Status: AC
  Administered 2022-08-30: 1 [in_us] via TOPICAL
  Filled 2022-08-30: qty 1

## 2022-08-30 MED ORDER — ALUM & MAG HYDROXIDE-SIMETH 200-200-20 MG/5ML PO SUSP
30.0000 mL | Freq: Once | ORAL | Status: AC
  Administered 2022-08-30: 30 mL via ORAL
  Filled 2022-08-30: qty 30

## 2022-08-30 MED ORDER — POTASSIUM CHLORIDE 20 MEQ PO PACK
40.0000 meq | PACK | Freq: Once | ORAL | Status: AC
  Administered 2022-08-30: 40 meq via ORAL
  Filled 2022-08-30: qty 2

## 2022-08-30 MED ORDER — PANTOPRAZOLE SODIUM 40 MG IV SOLR: 40.0000 mg | Freq: Two times a day (BID) | INTRAVENOUS | Status: DC

## 2022-08-30 MED ORDER — AMLODIPINE BESYLATE 5 MG PO TABS
10.0000 mg | ORAL_TABLET | Freq: Once | ORAL | Status: AC
  Administered 2022-08-30: 10 mg via ORAL
  Filled 2022-08-30: qty 2

## 2022-08-30 MED ORDER — CLONIDINE HCL 0.2 MG PO TABS
0.2000 mg | ORAL_TABLET | Freq: Once | ORAL | Status: AC
  Administered 2022-08-30: 0.2 mg via ORAL
  Filled 2022-08-30: qty 1

## 2022-08-30 MED ORDER — FENTANYL CITRATE PF 50 MCG/ML IJ SOSY: 50.0000 ug | PREFILLED_SYRINGE | INTRAMUSCULAR | Status: DC | PRN

## 2022-08-30 MED ORDER — LIDOCAINE VISCOUS HCL 2 % MT SOLN
15.0000 mL | Freq: Once | OROMUCOSAL | Status: AC
  Administered 2022-08-30: 15 mL via ORAL
  Filled 2022-08-30: qty 15

## 2022-08-30 MED ORDER — TECHNETIUM TO 99M ALBUMIN AGGREGATED: 4.0000 | Freq: Once | INTRAVENOUS | Status: DC | PRN

## 2022-08-30 MED ORDER — ASPIRIN 81 MG PO CHEW
324.0000 mg | CHEWABLE_TABLET | Freq: Once | ORAL | Status: AC
  Administered 2022-08-30: 324 mg via ORAL
  Filled 2022-08-30: qty 4

## 2022-08-30 MED ORDER — FENTANYL CITRATE PF 50 MCG/ML IJ SOSY
50.0000 ug | PREFILLED_SYRINGE | Freq: Once | INTRAMUSCULAR | Status: AC
  Administered 2022-08-30: 50 ug via INTRAVENOUS
  Filled 2022-08-30: qty 1

## 2022-08-30 MED ORDER — FAMOTIDINE IN NACL 20-0.9 MG/50ML-% IV SOLN
20.0000 mg | Freq: Once | INTRAVENOUS | Status: AC
  Administered 2022-08-30: 20 mg via INTRAVENOUS
  Filled 2022-08-30: qty 50

## 2022-08-30 MED ORDER — ALUM & MAG HYDROXIDE-SIMETH 200-200-20 MG/5ML PO SUSP
15.0000 mL | Freq: Once | ORAL | Status: AC
  Administered 2022-08-30: 15 mL via ORAL
  Filled 2022-08-30: qty 30

## 2022-08-30 MED ORDER — CLONIDINE HCL 0.1 MG PO TABS
0.1000 mg | ORAL_TABLET | Freq: Once | ORAL | Status: AC
  Administered 2022-08-30: 0.1 mg via ORAL
  Filled 2022-08-30: qty 1

## 2022-08-30 NOTE — ED Notes (Signed)
ED MD at bedside to discuss plan of care. Family remains at bedside.

## 2022-08-30 NOTE — ED Notes (Signed)
Son contacted regarding patient leaving.

## 2022-08-30 NOTE — Assessment & Plan Note (Signed)
Creatinine stable 2.51.

## 2022-08-30 NOTE — H&P (Addendum)
History and Physical    James [name] OYD:[id number] DOB: 28-Jun-1946 DOA: 08/30/2022  PCP: [name], [name], MD   Patient coming from: Home  I have personally briefly reviewed patient's old medical records in [place]  Chief Complaint: Chest pain  HPI: James [name] is a 77 y.o. male with medical history significant for dementia, CKD 3, gout, hypertension. Patient was brought to the ED with reports of chest pain and difficulty breathing.  At time of my evaluation, patient is awake alert and oriented to person and place, but speech is sometimes confused.  Patient's son - James [name] is at bedside, reports this is patient's baseline, and assist with the history. Patient's son was with patient when patient was using his BiPAP, started belching a lot, and is complaining of severe chest pains and difficulty breathing.  Chest pain is mid lower chest, and upper abdomen. Son reports he thought patient might have aspirated, because he was belching a lot.  After GI cocktail in the ED, patient's symptoms significantly improved.  No black stools, no bloody stools, no vomiting. Patient has chronic bilateral lower extremity swelling that is unchanged for which he takes diuretics Lasix. Patient has a history of pericardial effusion for which he has been followed closely outpatient.  ED Course: T Max-98.9, heart rate 60s to 79.  Respiratory rate 18-33.  Blood pressure systolic initially up to 672/09, now 164/73.  O2 sats greater than 91% on room air. Potassium 2.9. Cr-2.5. D Dimer- 1.75 BNP- 340. CT chest abdomen and pelvis without contrast-shows cardiac enlargement with large pericardial effusion multiple pulmonary nodules, small bilateral pleural effusions, compressive atelectasis. EDP talked to cardiologist Dr. Koleen Nimrod, recommended echo, check ESR CRP, rule out inflammatory pericarditis. EDP talked to GI, recommended admission, will see in consult, need transfer to [facility] for EGD, if  needed, pending anaesthesiology eval.  Review of Systems: As per HPI all other systems reviewed and negative.  Past Medical History:  Diagnosis Date   Arthritis    Carotid artery occlusion    DVT (deep venous thrombosis) ([place]) 2008   GERD (gastroesophageal reflux disease)    takes Protonix daily   Gout    takes Allopurinol daily and Indomethacin prn   H/O renal artery stenosis    H/O: GI bleed    History of blood transfusion    History of colon polyps    History of gastric ulcer    Hyperlipidemia    takes Lipitor daily   Hypertension    takes Metoprolol and Amlodipine daily   Joint pain    Joint swelling     Past Surgical History:  Procedure Laterality Date   CAROTID ENDARTERECTOMY  2008   right CEA   CHOLECYSTECTOMY  mid 90's   COLON SURGERY     COLONOSCOPY     ENDARTERECTOMY  06/02/2012   Procedure: ENDARTERECTOMY CAROTID;  Surgeon: Serafina Mitchell, MD;  Location: [facility];  Service: Vascular;  Laterality: Left;   ESOPHAGOGASTRODUODENOSCOPY     HERNIA REPAIR     umbilical hernia   PATCH ANGIOPLASTY  06/02/2012   Procedure: PATCH ANGIOPLASTY;  Surgeon: Serafina Mitchell, MD;  Location: [facility];  Service: Vascular;  Laterality: Left;  using Vascu-Guard Patch   PERIPHERAL VASCULAR CATHETERIZATION Right 12/03/2015   Procedure: Peripheral Vascular Balloon Angioplasty;  Surgeon: Serafina Mitchell, MD;  Location: [facility];  Service: Cardiovascular;  Laterality: Right;  RENAL unsuccessful   RENAL ARTERY STENT  2009   Right renal artery  stenting-  Right Kidney   small intestine removed  2001   d/t random gi bleeding     reports that he quit smoking about 22 years ago. His smoking use included cigarettes. He has a 54.00 pack-year smoking history. He has never used smokeless tobacco. He reports that he does not drink alcohol and does not use drugs.  Allergies  Allergen Reactions   Plavix [Clopidogrel Bisulfate] Other (See Comments)    GI Bleed   Uloric [Febuxostat] Other  (See Comments)    Unknown reaction    Family History  Problem Relation Age of Onset   Hypertension Mother    Hyperlipidemia Mother    Other Mother        varicose veins   Hyperlipidemia Father    Hypertension Father    Diabetes Father    Clotting disorder Father    Deep vein thrombosis Father    Hypertension Sister    Diabetes Sister    Hyperlipidemia Sister    Varicose Veins Sister    Other Sister        Bleeding problems   Hypertension Brother    Hyperlipidemia Brother     Prior to Admission medications   Medication Sig Start Date End Date Taking? Authorizing Provider  allopurinol (ZYLOPRIM) 100 MG tablet Take 2 tablets (200 mg total) by mouth daily. Patient taking differently: Take 200 mg by mouth 2 (two) times daily. 02/13/22  Yes [name], [name], MD  amLODipine-atorvastatin (CADUET) 10-40 MG tablet Take 1 tablet by mouth daily. Patient taking differently: Take 1 tablet by mouth every evening. 02/13/22  Yes [name], [name], MD  aspirin 81 MG tablet Take 81 mg by mouth daily.   Yes [provider]  Cholecalciferol (VITAMIN D3) 50 MCG (2000 UT) TABS Take 2,000 Units by mouth daily.   Yes [provider]  cloNIDine (CATAPRES) 0.2 MG tablet Take 0.3 mg by mouth 3 (three) times daily. 08/04/21  Yes [provider]  donepezil (ARICEPT) 5 MG tablet Take 1 tablet (5 mg total) by mouth at bedtime. 02/13/22  Yes [name], [name], MD  doxazosin (CARDURA) 2 MG tablet Take 4 mg by mouth 2 (two) times daily. 08/24/19  Yes [provider]  ferrous sulfate 325 (65 FE) MG tablet Take 325 mg by mouth in the morning and at bedtime.   Yes [provider]  furosemide (LASIX) 40 MG tablet Take 80 mg by mouth 2 (two) times daily. 01/31/19  Yes [provider]  potassium chloride (MICRO-K) 10 MEQ CR capsule Take 10 mEq by mouth daily. 12/10/19  Yes [provider]  sertraline (ZOLOFT) 50 MG tablet Take 1 tablet (50 mg total) by mouth  daily. Patient not taking: Reported on 08/30/2022 08/21/22   [name], [name], MD    Physical Exam: Vitals:   08/30/22 1700 08/30/22 1730 08/30/22 1800 08/30/22 1830  BP: (!) 180/75 (!) 178/75 (!) 171/74 (!) 164/73  Pulse: 69 69 65 62  Resp: 18   17  Temp:      TempSrc:      SpO2: 93% 93% 94% 94%  Weight:      Height:        Constitutional: NAD, calm, comfortable Vitals:   08/30/22 1700 08/30/22 1730 08/30/22 1800 08/30/22 1830  BP: (!) 180/75 (!) 178/75 (!) 171/74 (!) 164/73  Pulse: 69 69 65 62  Resp: 18   17  Temp:      TempSrc:      SpO2: 93%  93% 94% 94%  Weight:      Height:       Eyes: PERRL, lids and conjunctivae normal ENMT: Mucous membranes are moist.  Neck: normal, supple, no masses, no thyromegaly Respiratory: clear to auscultation bilaterally, no wheezing, no crackles. Normal respiratory effort. No accessory muscle use.  Cardiovascular: Regular rate and rhythm, no murmurs / rubs / gallops.  1+ pitting bilateral lower extremity edema.  Extremities warm. Abdomen: no tenderness, no masses palpated. No hepatosplenomegaly. Bowel sounds positive.  Musculoskeletal: no clubbing / cyanosis. No joint deformity upper and lower extremities.  Skin: no rashes, lesions, ulcers. No induration Neurologic: No apparent cranial nerve abnormality, moving extremities spontaneously Psychiatric:  Alert and oriented x person this. Normal mood.   Labs on Admission: I have personally reviewed following labs and imaging studies  CBC: Recent Labs  Lab 08/30/22 0728  WBC 11.4*  HGB 11.7*  HCT 35.3*  MCV 84.9  PLT 562   Basic Metabolic Panel: Recent Labs  Lab 08/30/22 0921  NA 135  K 2.9*  CL 98  CO2 24  GLUCOSE 151*  BUN 33*  CREATININE 2.51*  CALCIUM 8.4*  MG 2.0   GFR: Estimated Creatinine Clearance: 25.4 mL/min (A) (by C-G formula based on SCr of 2.51 mg/dL (H)). Liver Function Tests: Recent Labs  Lab 08/30/22 0921  AST 19  ALT 14  ALKPHOS 157*  BILITOT 1.0   PROT 6.8  ALBUMIN 3.7   Recent Labs  Lab 08/30/22 0921  LIPASE 46   CBG: Recent Labs  Lab 08/30/22 1029  GLUCAP 138*    Radiological Exams on Admission: NM Pulmonary Perfusion  Result Date: 08/30/2022 CLINICAL DATA:  Pulmonary embolism suspected.  Positive D-dimer EXAM: NUCLEAR MEDICINE PERFUSION LUNG SCAN TECHNIQUE: Perfusion images were obtained in multiple projections after intravenous injection of radiopharmaceutical. Ventilation scans intentionally deferred if perfusion scan and chest x-ray adequate for interpretation during COVID 19 epidemic. RADIOPHARMACEUTICALS:  4.4 mCi Tc-16mMAA IV COMPARISON:  CT chest dated August 30, 2022 FINDINGS: No segmental perfusion defects were noted.  Marked cardiomegaly. IMPRESSION: Very low probability of pulmonary embolism. Electronically Signed   By: IKeane PoliceD.O.   On: 08/30/2022 15:31   CT CHEST ABDOMEN PELVIS WO CONTRAST  Result Date: 08/30/2022 CLINICAL DATA:  Evaluate for aortic aneurysm. EXAM: CT CHEST, ABDOMEN AND PELVIS WITHOUT CONTRAST TECHNIQUE: Multidetector CT imaging of the chest, abdomen and pelvis was performed following the standard protocol without IV contrast. RADIATION DOSE REDUCTION: This exam was performed according to the departmental dose-optimization program which includes automated exposure control, adjustment of the mA and/or kV according to patient size and/or use of iterative reconstruction technique. COMPARISON:  None Available. FINDINGS: CT CHEST FINDINGS Cardiovascular: Moderate cardiac enlargement. Large pericardial effusion is identified measuring up to 4.3 cm in thickness, image 42/2. Aortic atherosclerosis. Normal caliber thoracic aorta. Coronary artery calcifications. Mediastinum/Nodes: Thyroid gland, trachea, and esophagus demonstrate no significant findings. No enlarged axillary, or mediastinal lymph nodes. Hilar lymph nodes are suboptimally evaluated due to lack of IV contrast. Lungs/Pleura: Small bilateral  pleural effusions. There is compressive type atelectasis involving the lingula and left lower lobe secondary to cardiac enlargement and large pericardial effusion. Mild atelectasis within the posterior right lung base. Small scattered lung nodules are identified within both upper lobes. This includes: -lateral right upper lobe lung nodule measures 3 mm, image 35/3. -Ground-glass lateral left upper lobe lung nodule measures 4 mm, image 18/3. -Ground-glass nodule within the posterior right upper lobe measures 0.7 cm, image  31/3. Musculoskeletal: No chest wall mass or suspicious bone lesions identified. Bilateral gynecomastia. CT ABDOMEN PELVIS FINDINGS Hepatobiliary: No focal liver abnormality is seen. Status post cholecystectomy. No biliary dilatation. Pancreas: No main duct dilatation or inflammation. A few scattered parenchymal calcifications noted. Spleen: Normal in size without focal abnormality. Adrenals/Urinary Tract: Normal adrenal glands. Unchanged appearance of previously characterized Bosniak class 1 and Bosniak class 2 cysts noted within both kidneys. No follow-up imaging recommended. No nephrolithiasis or hydronephrosis. Urinary bladder is unremarkable. Stomach/Bowel: Stomach appears normal. The appendix is visualized and appears normal. Moderate retained stool identified throughout the colon. Sigmoid diverticulosis without signs of diverticulitis. No bowel wall thickening, inflammation or distension. Vascular/Lymphatic: Aortic atherosclerosis. No aneurysm. No abdominopelvic adenopathy. Reproductive: Prostate gland enlargement. Other: Right inguinal hernia is identified containing fat and a small amount of fluid, image 52/5. No focal fluid collections. No signs of pneumoperitoneum. Musculoskeletal: No acute or suspicious osseous findings. IMPRESSION: 1. Cardiac enlargement with large pericardial effusion. 2. No signs of aortic aneurysm. 3. Small bilateral pleural effusions with compressive type  atelectasis involving the lingula and left lower lobe. 4. Multiple pulmonary nodules. Most significant: 7 mm right ground-glass pulmonary nodule within the upper lobe. Per Fleischner Society Guidelines, recommend a non-contrast Chest CT at 3-6 months. Subsequent management based on the most suspicious nodule(s). These guidelines do not apply to immunocompromised patients and patients with cancer. Follow up in patients with significant comorbidities as clinically warranted. For lung cancer screening, adhere to Lung-RADS guidelines. Reference: Radiology. 2017; 284(1):228-43. 5. Moderate retained stool identified throughout the colon. Correlate for any clinical signs or symptoms of constipation. 6. Prostate gland enlargement. 7. Right inguinal hernia containing fat and a small amount of fluid. 8.  Aortic Atherosclerosis (ICD10-I70.0). Electronically Signed   By: Kerby Moors M.D.   On: 08/30/2022 10:36   DG Chest Port 1 View  Result Date: 08/30/2022 CLINICAL DATA:  Chest pain. EXAM: PORTABLE CHEST 1 VIEW COMPARISON:  06/09/2017. FINDINGS: The heart size appears enlarged. Aortic atherosclerotic calcifications. Moderate left pleural effusion is suspected. Decreased aeration to the left base may represent atelectasis or airspace disease. Right lung appears clear. IMPRESSION: Suspect moderate left pleural effusion with decreased aeration to the left base. Electronically Signed   By: Kerby Moors M.D.   On: 08/30/2022 08:06    EKG: Independently reviewed.  Sinus rhythm, rate 71, QTc 467.  Assessment/Plan Principal Problem:   Chest pain Active Problems:   Hypertension   Gout   OSA on CPAP   Chronic kidney disease (CKD), stage III (moderate) (HCC)   Pericardial effusion   Pulmonary nodules   CKD (chronic kidney disease) stage 4, GFR 15-29 ml/min (HCC)   Hypokalemia  Assessment and Plan: * Chest pain Chest pain with difficulty breathing.  CT chest abdomen pelvis without contrast showing large  pericardial effusion.  History of same, followed by cardiology with serial echoes, last echo 04/2022, effusion size unchanged, no evidence of cardiac tamponade, EF 60 to 65%.  Symptoms improved with GI cocktail, differentials also include gastritis, peptic ulcer disease.  No history to suggest GI blood loss.  Hemoglobin stable. -Troponin 8 > 9, EKG without significant changes - EDP talked to Dr. Koleen Nimrod with cardiology, recommended checking ESR CRP to rule out inflammatory pericarditis, check echo. -IV Protonix 40 twice daily -GI to see in consult -N. P.o. midnight -EKG a.m. -Resume aspirin  Hypokalemia Potassium 2.9.  Magnesium 2.  QTc prolonged 567 - Replete K -Check EKG in a.m.  CKD (chronic kidney disease)  stage 4, GFR 15-29 ml/min (HCC) Creatinine stable 2.51.  Pulmonary nodules  CT findings today 08/30/2022 - multiple pulmonary nodules. Most significant: 7 mm right ground-glass pulmonary nodule within the upper lobe. Per Fleischner Society Guidelines, recommend a non-contrast Chest CT at 3-6 months. Subsequent management based on the most suspicious nodule(s). These guidelines do not apply to immunocompromised patients and patients with cancer. Follow up in patients with significant comorbidities as clinically warranted.  Pericardial effusion Follows with cardiology as outpatient by serial echoes.  Obtain echocardiogram  Gout Resume allopurinol  Hypertension Blood pressure elevated. -Resume amlodipine, clonidine   Addendum- Patient left AMA.  Patient was refusing to wear a monitor, yelling, wants to go home, aggressive with the staff, dressed up and wants to leave.  I was called by patient's nurse, that patient wants to leave AMA.  I called patient's son who was present previously- James [name]- patients primary decision maker and who patient lives with, but has not signed POA paperwork. Son asked me to keep patient in the hospital, and he will be here shortly.  On arrival,  patient's son wants to take patient home as patient requests, he tells me that his father seems and feels much better, he has follow-up with Dr. Percival Spanish for outpatient echocardiogram, and they will keep the appointment. They understand to bring patient back to the ED if recurrence of symptoms.   DVT prophylaxis: Lovenox Code Status: Full Family Communication:Son James [name] at Bedside- primary decision maker. Disposition Plan: ~ 1 - 2 days Consults called: None  Admission status:  Obs tele  Author: Bethena Roys, MD 08/30/2022 11:46 PM  For on call review [email or web address].

## 2022-08-30 NOTE — ED Notes (Signed)
Hospitalist in room.

## 2022-08-30 NOTE — ED Notes (Signed)
Patient alert and oriented

## 2022-08-30 NOTE — Assessment & Plan Note (Addendum)
Potassium 2.9.  Magnesium 2.  QTc prolonged 567 - Replete K -Check EKG in a.m.

## 2022-08-30 NOTE — Assessment & Plan Note (Signed)
Resume allopurinol 

## 2022-08-30 NOTE — ED Notes (Signed)
ED Provider at bedside. 

## 2022-08-30 NOTE — ED Notes (Signed)
Patient transported to [facility] 

## 2022-08-30 NOTE — ED Provider Notes (Signed)
  Physical Exam  BP (!) 178/75   Pulse 69   Temp 98.4 F (36.9 C) (Oral)   Resp 18   Ht '5\' 10"'$  (1.778 m)   Wt 77.1 kg   SpO2 93%   BMI 24.39 kg/m   Physical Exam Vitals and nursing note reviewed.  Constitutional:      General: He is not in acute distress.    Appearance: He is well-developed.  HENT:     Head: Normocephalic and atraumatic.  Eyes:     Conjunctiva/sclera: Conjunctivae normal.  Cardiovascular:     Rate and Rhythm: Normal rate and regular rhythm.     Heart sounds: No murmur heard. Pulmonary:     Effort: Pulmonary effort is normal. No respiratory distress.     Breath sounds: Normal breath sounds.  Abdominal:     Palpations: Abdomen is soft.     Tenderness: There is abdominal tenderness.  Musculoskeletal:        General: No swelling.     Cervical back: Neck supple.  Skin:    General: Skin is warm and dry.     Capillary Refill: Capillary refill takes less than 2 seconds.  Neurological:     Mental Status: He is alert.  Psychiatric:        Mood and Affect: Mood normal.     Procedures  Procedures  ED Course / MDM    Medical Decision Making Amount and/or Complexity of Data Reviewed Labs: ordered. Radiology: ordered.  Risk OTC drugs. Prescription drug management.   Patient received an handoff.  Chest pain with known large pericardial effusion, history of peptic ulcer disease.  Negative V/Q study.  CT showing known large pericardial effusion and some pleural effusions.  Pending cardiology consult at time of signout.  I spoke with Dr. Koleen Nimrod of cardiology who states that inflammatory markers would be helpful in the setting to rule out inflammatory pericarditis and they will follow-up echocardiography performed in the hospital.  On my reevaluation, it seems that this pain is more gastrointestinal in nature as it does extend to the epigastrium and with his history of peptic ulcer disease, suspect likely duodenal ulcer for his gastric ulcer.  His pain does  appear to get better with GI cocktail furthering the suspicion.  I spoke with Dr. Jenetta Downer of gastroenterology who states that they be happy to see the patient tomorrow to evaluate for EGD but anesthesia may be hesitant to perform this at [facility], and ultimately he may require transfer to [facility], but we will plan to keep him at [facility] for now.  Patient then admitted on twice daily PPI       Mcarthur Ivins, Debe Coder, MD 08/30/22 [phone]

## 2022-08-30 NOTE — Assessment & Plan Note (Signed)
CT findings today 08/30/2022 - multiple pulmonary nodules. Most significant: 7 mm right ground-glass pulmonary nodule within the upper lobe. Per Fleischner Society Guidelines, recommend a non-contrast Chest CT at 3-6 months. Subsequent management based on the most suspicious nodule(s). These guidelines do not apply to immunocompromised patients and patients with cancer. Follow up in patients with significant comorbidities as clinically warranted.

## 2022-08-30 NOTE — ED Notes (Signed)
Patient remained alert and oriented

## 2022-08-30 NOTE — Assessment & Plan Note (Addendum)
Chest pain with difficulty breathing.  CT chest abdomen pelvis without contrast showing large pericardial effusion.  History of same, followed by cardiology with serial echoes, last echo 04/2022, effusion size unchanged, no evidence of cardiac tamponade, EF 60 to 65%.  Symptoms improved with GI cocktail, differentials also include gastritis, peptic ulcer disease.  No history to suggest GI blood loss.  Hemoglobin stable. -Troponin 8 > 9, EKG without significant changes - EDP talked to Dr. Koleen Nimrod with cardiology, recommended checking ESR CRP to rule out inflammatory pericarditis, check echo. -IV Protonix 40 twice daily -GI to see in consult -N. P.o. midnight -EKG a.m. -Resume aspirin

## 2022-08-30 NOTE — ED Notes (Signed)
IV removed.

## 2022-08-30 NOTE — ED Provider Notes (Signed)
[facility] Provider Note   CSN: [id number] Arrival date & time: 08/30/22  0715     History  Chief Complaint  Patient presents with   Chest Pain    James [name] is a 77 y.o. male.   Chest Pain Associated symptoms: shortness of breath   Patient presents for chest pain and shortness of breath.  Medical history includes HTN, HLD, gout, carotid stenosis OSA, COPD, pericardial effusion, DVT, arthritis.  Approximate 1 hour prior to arrival, patient was sleeping with his CPAP on.  He had burping and then complained of central chest pain that radiates to bilateral anterior aspects of his chest.  Pain is worsened with deep inspiration.  Because of this, he endorses associated shortness of breath.  His grandson, who he lives with, that he may have aspirated while on a CPAP.  Patient's main cardiac history is the ongoing pleural effusion, which has been described as large and cardiology is currently monitoring it.  His blood pressure typically is in the 160-170 SBP range.  He has mild dementia at baseline.  This morning with his symptoms, he took some Gas-X.  He did not take any other morning medications.    Home Medications Prior to Admission medications   Medication Sig Start Date End Date Taking? Authorizing Provider  allopurinol (ZYLOPRIM) 100 MG tablet Take 2 tablets (200 mg total) by mouth daily. Patient taking differently: Take 200 mg by mouth 2 (two) times daily. 02/13/22  Yes [name], [name], MD  amLODipine-atorvastatin (CADUET) 10-40 MG tablet Take 1 tablet by mouth daily. Patient taking differently: Take 1 tablet by mouth every evening. 02/13/22  Yes [name], [name], MD  aspirin 81 MG tablet Take 81 mg by mouth daily.   Yes [provider]  Cholecalciferol (VITAMIN D3) 50 MCG (2000 UT) TABS Take 2,000 Units by mouth daily.   Yes [provider]  cloNIDine (CATAPRES) 0.2 MG tablet Take 0.3 mg by mouth 3 (three)  times daily. 08/04/21  Yes [provider]  donepezil (ARICEPT) 5 MG tablet Take 1 tablet (5 mg total) by mouth at bedtime. 02/13/22  Yes [name], [name], MD  doxazosin (CARDURA) 2 MG tablet Take 4 mg by mouth 2 (two) times daily. 08/24/19  Yes [provider]  ferrous sulfate 325 (65 FE) MG tablet Take 325 mg by mouth in the morning and at bedtime.   Yes [provider]  furosemide (LASIX) 40 MG tablet Take 80 mg by mouth 2 (two) times daily. 01/31/19  Yes [provider]  potassium chloride (MICRO-K) 10 MEQ CR capsule Take 10 mEq by mouth daily. 12/10/19  Yes [provider]  sertraline (ZOLOFT) 50 MG tablet Take 1 tablet (50 mg total) by mouth daily. Patient not taking: Reported on 08/30/2022 08/21/22   [name], [name], MD      Allergies    Plavix [clopidogrel bisulfate] and Uloric [febuxostat]    Review of Systems   Review of Systems  Respiratory:  Positive for shortness of breath.   Cardiovascular:  Positive for chest pain.  All other systems reviewed and are negative.   Physical Exam Updated Vital Signs BP (!) 180/75   Pulse 69   Temp 98.4 F (36.9 C) (Oral)   Resp 18   Ht '5\' 10"'$  (1.778 m)   Wt 77.1 kg   SpO2 93%   BMI 24.39 kg/m  Physical Exam Vitals and nursing note reviewed.  Constitutional:  General: He is not in acute distress.    Appearance: He is well-developed. He is not ill-appearing, toxic-appearing or diaphoretic.  HENT:     Head: Normocephalic and atraumatic.  Eyes:     Conjunctiva/sclera: Conjunctivae normal.  Cardiovascular:     Rate and Rhythm: Normal rate and regular rhythm.     Heart sounds: No murmur heard. Pulmonary:     Effort: Pulmonary effort is normal. Tachypnea present. No accessory muscle usage, respiratory distress or retractions.     Breath sounds: Normal breath sounds. No stridor. No decreased breath sounds, wheezing, rhonchi or rales.  Chest:     Chest wall: Tenderness present.   Abdominal:     Palpations: Abdomen is soft.     Tenderness: There is no abdominal tenderness.  Musculoskeletal:        General: No swelling.     Cervical back: Normal range of motion and neck supple.     Right lower leg: Edema present.     Left lower leg: Edema present.  Skin:    General: Skin is warm and dry.     Capillary Refill: Capillary refill takes less than 2 seconds.     Coloration: Skin is not cyanotic or pale.  Neurological:     General: No focal deficit present.     Mental Status: He is alert and oriented to person, place, and time.  Psychiatric:        Mood and Affect: Mood normal.        Behavior: Behavior normal.     ED Results / Procedures / Treatments   Labs (all labs ordered are listed, but only abnormal results are displayed) Labs Reviewed  CBC - Abnormal; Notable for the following components:      Result Value   WBC 11.4 (*)    RBC 4.16 (*)    Hemoglobin 11.7 (*)    HCT 35.3 (*)    All other components within normal limits  BRAIN NATRIURETIC PEPTIDE - Abnormal; Notable for the following components:   B Natriuretic Peptide 340.0 (*)    All other components within normal limits  BASIC METABOLIC PANEL - Abnormal; Notable for the following components:   Potassium 2.9 (*)    Glucose, Bld 151 (*)    BUN 33 (*)    Creatinine, Ser 2.51 (*)    Calcium 8.4 (*)    GFR, Estimated 26 (*)    All other components within normal limits  HEPATIC FUNCTION PANEL - Abnormal; Notable for the following components:   Alkaline Phosphatase 157 (*)    All other components within normal limits  D-DIMER, QUANTITATIVE - Abnormal; Notable for the following components:   D-Dimer, Quant 1.75 (*)    All other components within normal limits  CBG MONITORING, ED - Abnormal; Notable for the following components:   Glucose-Capillary 138 (*)    All other components within normal limits  LIPASE, BLOOD  MAGNESIUM  SEDIMENTATION RATE  C-REACTIVE PROTEIN  TROPONIN I (HIGH  SENSITIVITY)  TROPONIN I (HIGH SENSITIVITY)    EKG EKG Interpretation  Date/Time:  Sunday August 30 2022 07:25:31 EST Ventricular Rate:  71 PR Interval:  168 QRS Duration: 110 QT Interval:  521 QTC Calculation: 567 R Axis:   -16 Text Interpretation: Sinus rhythm Borderline left axis deviation Borderline low voltage, extremity leads Confirmed by Godfrey Pick [phone]) on 08/30/2022 12:30:49 PM  Radiology NM Pulmonary Perfusion  Result Date: 08/30/2022 CLINICAL DATA:  Pulmonary embolism suspected.  Positive D-dimer EXAM: NUCLEAR MEDICINE  PERFUSION LUNG SCAN TECHNIQUE: Perfusion images were obtained in multiple projections after intravenous injection of radiopharmaceutical. Ventilation scans intentionally deferred if perfusion scan and chest x-ray adequate for interpretation during COVID 19 epidemic. RADIOPHARMACEUTICALS:  4.4 mCi Tc-65mMAA IV COMPARISON:  CT chest dated August 30, 2022 FINDINGS: No segmental perfusion defects were noted.  Marked cardiomegaly. IMPRESSION: Very low probability of pulmonary embolism. Electronically Signed   By: IKeane PoliceD.O.   On: 08/30/2022 15:31   CT CHEST ABDOMEN PELVIS WO CONTRAST  Result Date: 08/30/2022 CLINICAL DATA:  Evaluate for aortic aneurysm. EXAM: CT CHEST, ABDOMEN AND PELVIS WITHOUT CONTRAST TECHNIQUE: Multidetector CT imaging of the chest, abdomen and pelvis was performed following the standard protocol without IV contrast. RADIATION DOSE REDUCTION: This exam was performed according to the departmental dose-optimization program which includes automated exposure control, adjustment of the mA and/or kV according to patient size and/or use of iterative reconstruction technique. COMPARISON:  None Available. FINDINGS: CT CHEST FINDINGS Cardiovascular: Moderate cardiac enlargement. Large pericardial effusion is identified measuring up to 4.3 cm in thickness, image 42/2. Aortic atherosclerosis. Normal caliber thoracic aorta. Coronary artery calcifications.  Mediastinum/Nodes: Thyroid gland, trachea, and esophagus demonstrate no significant findings. No enlarged axillary, or mediastinal lymph nodes. Hilar lymph nodes are suboptimally evaluated due to lack of IV contrast. Lungs/Pleura: Small bilateral pleural effusions. There is compressive type atelectasis involving the lingula and left lower lobe secondary to cardiac enlargement and large pericardial effusion. Mild atelectasis within the posterior right lung base. Small scattered lung nodules are identified within both upper lobes. This includes: -lateral right upper lobe lung nodule measures 3 mm, image 35/3. -Ground-glass lateral left upper lobe lung nodule measures 4 mm, image 18/3. -Ground-glass nodule within the posterior right upper lobe measures 0.7 cm, image 31/3. Musculoskeletal: No chest wall mass or suspicious bone lesions identified. Bilateral gynecomastia. CT ABDOMEN PELVIS FINDINGS Hepatobiliary: No focal liver abnormality is seen. Status post cholecystectomy. No biliary dilatation. Pancreas: No main duct dilatation or inflammation. A few scattered parenchymal calcifications noted. Spleen: Normal in size without focal abnormality. Adrenals/Urinary Tract: Normal adrenal glands. Unchanged appearance of previously characterized Bosniak class 1 and Bosniak class 2 cysts noted within both kidneys. No follow-up imaging recommended. No nephrolithiasis or hydronephrosis. Urinary bladder is unremarkable. Stomach/Bowel: Stomach appears normal. The appendix is visualized and appears normal. Moderate retained stool identified throughout the colon. Sigmoid diverticulosis without signs of diverticulitis. No bowel wall thickening, inflammation or distension. Vascular/Lymphatic: Aortic atherosclerosis. No aneurysm. No abdominopelvic adenopathy. Reproductive: Prostate gland enlargement. Other: Right inguinal hernia is identified containing fat and a small amount of fluid, image 52/5. No focal fluid collections. No signs  of pneumoperitoneum. Musculoskeletal: No acute or suspicious osseous findings. IMPRESSION: 1. Cardiac enlargement with large pericardial effusion. 2. No signs of aortic aneurysm. 3. Small bilateral pleural effusions with compressive type atelectasis involving the lingula and left lower lobe. 4. Multiple pulmonary nodules. Most significant: 7 mm right ground-glass pulmonary nodule within the upper lobe. Per Fleischner Society Guidelines, recommend a non-contrast Chest CT at 3-6 months. Subsequent management based on the most suspicious nodule(s). These guidelines do not apply to immunocompromised patients and patients with cancer. Follow up in patients with significant comorbidities as clinically warranted. For lung cancer screening, adhere to Lung-RADS guidelines. Reference: Radiology. 2017; 284(1):228-43. 5. Moderate retained stool identified throughout the colon. Correlate for any clinical signs or symptoms of constipation. 6. Prostate gland enlargement. 7. Right inguinal hernia containing fat and a small amount of fluid. 8.  Aortic Atherosclerosis (  ICD10-I70.0). Electronically Signed   By: Kerby Moors M.D.   On: 08/30/2022 10:36   DG Chest Port 1 View  Result Date: 08/30/2022 CLINICAL DATA:  Chest pain. EXAM: PORTABLE CHEST 1 VIEW COMPARISON:  06/09/2017. FINDINGS: The heart size appears enlarged. Aortic atherosclerotic calcifications. Moderate left pleural effusion is suspected. Decreased aeration to the left base may represent atelectasis or airspace disease. Right lung appears clear. IMPRESSION: Suspect moderate left pleural effusion with decreased aeration to the left base. Electronically Signed   By: Kerby Moors M.D.   On: 08/30/2022 08:06    Procedures Procedures    Medications Ordered in ED Medications  fentaNYL (SUBLIMAZE) injection 50 mcg (has no administration in time range)  technetium albumin aggregated (MAA) injection solution 4 millicurie (has no administration in time range)   amLODipine (NORVASC) tablet 10 mg (has no administration in time range)  cloNIDine (CATAPRES) tablet 0.1 mg (has no administration in time range)  aspirin chewable tablet 324 mg (324 mg Oral Given 08/30/22 0738)  nitroGLYCERIN (NITROGLYN) 2 % ointment 1 inch (1 inch Topical Given 08/30/22 0738)  fentaNYL (SUBLIMAZE) injection 50 mcg (50 mcg Intravenous Given 08/30/22 0738)  fentaNYL (SUBLIMAZE) injection 50 mcg (50 mcg Intravenous Given 08/30/22 0858)  potassium chloride (KLOR-CON) packet 40 mEq (40 mEq Oral Given 08/30/22 1119)  fentaNYL (SUBLIMAZE) injection 100 mcg (100 mcg Intravenous Given 08/30/22 1144)  alum & mag hydroxide-simeth (MAALOX/MYLANTA) 200-200-20 MG/5ML suspension 30 mL (30 mLs Oral Given 08/30/22 1154)    And  lidocaine (XYLOCAINE) 2 % viscous mouth solution 15 mL (15 mLs Oral Given 08/30/22 1154)  famotidine (PEPCID) IVPB 20 mg premix (0 mg Intravenous Stopped 08/30/22 1224)  alum & mag hydroxide-simeth (MAALOX/MYLANTA) 200-200-20 MG/5ML suspension 15 mL (15 mLs Oral Given 08/30/22 1559)    And  lidocaine (XYLOCAINE) 2 % viscous mouth solution 15 mL (15 mLs Oral Given 08/30/22 1559)  cloNIDine (CATAPRES) tablet 0.2 mg (0.2 mg Oral Given 08/30/22 1635)    ED Course/ Medical Decision Making/ A&P                             Medical Decision Making Amount and/or Complexity of Data Reviewed Labs: ordered. Radiology: ordered.  Risk OTC drugs. Prescription drug management.   This patient presents to the ED for concern of chest pain, this involves an extensive number of treatment options, and is a complaint that carries with it a high risk of complications and morbidity.  The differential diagnosis includes ACS, pericarditis, worsening pericardial effusion, PE, GERD, pneumonia, pneumothorax   Co morbidities that complicate the patient evaluation  HTN, HLD, gout, carotid stenosis OSA, COPD, pericardial effusion, DVT, arthritis   Additional history obtained:  Additional history  obtained from patient's grandson External records from outside source obtained and reviewed including EMR   Lab Tests:  I Ordered, and personally interpreted labs.  The pertinent results include: Baseline anemia, mild leukocytosis, hypokalemia with otherwise normal electrolytes, baseline CKD, moderate elevation in BNP, normal troponins x 2, moderate elevation in D-dimer.   Imaging Studies ordered:  I ordered imaging studies including chest x-ray, CT of chest, abdomen, pelvis, VQ scan I independently visualized and interpreted imaging which showed very low probability of PE on VQ scan.  CT chest showed large pericardial effusion, bilateral pleural effusions, and left-sided atelectasis I agree with the radiologist interpretation   Cardiac Monitoring: / EKG:  The patient was maintained on a cardiac monitor.  I personally viewed  and interpreted the cardiac monitored which showed an underlying rhythm of: Sinus rhythm   Consultations Obtained:  I requested consultation with the cardiology,  and discussed lab and imaging findings as well as pertinent plan - they recommend: (Callback pending at time of signout)   Problem List / ED Course / Critical interventions / Medication management  Patient presents for central chest pain that radiates to both sides of anterior chest.  Onset was 1 hour prior to arrival.  It did wake him up from sleep.  At the time, he was on a CPAP and grandson has concerns of aspiration.  He did have burping and took Gas-X with no relief.  On arrival in the ED, patient has elevated SBP with widened pulse pressure.  He is mildly tachypneic.  He is able to speak in complete sentences.  He endorses ongoing severe chest pain that is worse with deep inspiration.  No wheezing or crackles are appreciated on lung auscultation.  Currently SpO2 is normal on room air.  EKG shows no concerning ST segment abnormalities.  Patient was ordered 324 of ASA, NTG ointment, and fentanyl.   Diagnostic workup was initiated.  After arrival in the ED, patient had SpO2 of 91% on room air.  He was placed on 2 L of supplemental oxygen.  Following fentanyl and NTG, he continued to have ongoing severe pleuritic pain.  Additional fentanyl was ordered.  Bedside ultrasound shows demonstration of known pericardial effusion.  Epigastric tenderness was present during ultrasound.  No tamponade physiology is appreciated.  Lab work is notable for hypokalemia, mild leukocytosis, mild elevation in BNP.  He had normal troponins x 2.  His D-dimer was mildly elevated raising concern of PE.  He does have a remote history of DVT and he is not currently on any blood thinning medications.  Currently, his GFR is very borderline.  Patient is try to preserve kidney function.  Contrasted scans were deferred and patient underwent a VQ scan.  VQ scan showed very low probability of PE.  His pain was improved most by GI cocktail.  This seemed to be the only thing that gave him relief.  Following first GI cocktail, he did have recurrence of pain and additional GI cocktail was ordered.  This also improved his symptoms.  Blood pressure remained elevated on blood pressure medications were ordered.  Given his large pericardial effusion, which has been present for 2 years, and his new chest pain, I am inclined to admit for observation.  Cardiology was consulted to see if they would prefer admission to [facility].  At time of signout, awaiting callback from cardiology.  Care of patient was signed out to oncoming ED provider. I ordered medication including NTG, ASA for concern of ACS; fentanyl for analgesia; potassium chloride for hypokalemia; GI cocktail for empiric treatment of reflux; clonidine and amlodipine for hypertension Reevaluation of the patient after these medicines showed that the patient improved I have reviewed the patients home medicines and have made adjustments as needed   Social Determinants of Health:  Lives at home  with grandson         Final Clinical Impression(s) / ED Diagnoses Final diagnoses:  Chest pain, unspecified type  Hypokalemia  Pericardial effusion    Rx / DC Orders ED Discharge Orders     None         Godfrey Pick, MD 08/30/22 1715

## 2022-08-30 NOTE — ED Notes (Signed)
Patient removed self from monitor. Ripped off nitropaste and demanded staff to take him to a room upstairs. Informed him that we do not have any rooms available as of yet that the department is working on making him one. Patient yelling. Informed patient that we are doing the best we can. Pt sitting in visitor chair with arms crossed stating he was not moving until he gets a bed. Urged patient to get into the bed so we can monitor him that he is making it difficult to give him the appropriate care he needs.  Patient eventually came back to the bed.. reconnected monitor.

## 2022-08-30 NOTE — Assessment & Plan Note (Signed)
Blood pressure elevated. -Resume amlodipine, clonidine

## 2022-08-30 NOTE — Assessment & Plan Note (Signed)
Follows with cardiology as outpatient by serial echoes.  Obtain echocardiogram

## 2022-08-30 NOTE — ED Notes (Addendum)
Pt refusing to keep cardiac monitor on, pt getting dressed refusing for me to remove his IV. Pt alert and oriented at this time. Pt states he is leaving and he doesn't care what happens to him. Pt ambulatory to waiting room with no assistance.

## 2022-08-30 NOTE — ED Triage Notes (Addendum)
Patient c/o upper, central, non-radiating chest pain that started approx 1 hour prior to arriving to ED. Per patient shortness of breath, belching, and dizziness. Denies any nausea, vomiting, or back pain. Denis any cardiac hx. Patient's son states patient has been belching heavly and took Gas-x with no relief.

## 2022-08-31 LAB — C-REACTIVE PROTEIN: CRP: 0.8 mg/dL (ref <1.0)

## 2022-09-09 ENCOUNTER — Other Ambulatory Visit: Payer: Self-pay

## 2022-09-09 DIAGNOSIS — F01B3 Vascular dementia, moderate, with mood disturbance: Secondary | ICD-10-CM

## 2022-09-09 MED ORDER — SERTRALINE HCL 50 MG PO TABS: 50.0000 mg | ORAL_TABLET | Freq: Every day | ORAL | 3 refills | Status: DC

## 2022-09-10 ENCOUNTER — Telehealth: Payer: Self-pay | Admitting: Cardiology

## 2022-09-10 NOTE — Telephone Encounter (Signed)
Pt stated that for the past 2-3 weeks, SOB has been more pronounced. He is "very tired all the time." Denies lightheadedness/dizziness or chest pain. SBP 130s. Wants to have echo now instead of in April, and then make an appointment with Dr. Percival Spanish. Please advise on SOB and echo.

## 2022-09-10 NOTE — Telephone Encounter (Signed)
Pt c/o Shortness Of Breath: STAT if SOB developed within the last 24 hours or pt is noticeably SOB on the phone  1. Are you currently SOB (can you hear that pt is SOB on the phone)? no  2. How long have you been experiencing SOB? A couple weeks  3. Are you SOB when sitting or when up moving around? Moving around  4. Are you currently experiencing any other symptoms? BP is dropping  Pt c/o BP issue: STAT if pt c/o blurred vision, one-sided weakness or slurred speech  1. What are your last 5 BP readings? 140-150/80  2. Are you having any other symptoms (ex. Dizziness, headache, blurred vision, passed out)? no  3. What is your BP issue? Patient states he has been having increased SOB and his BP has been dropping. He requests the call back go to his cell phone: [phone]

## 2022-09-10 NOTE — Telephone Encounter (Signed)
Follow up scheduled

## 2022-09-10 NOTE — Telephone Encounter (Signed)
LMTCB regarding scheduling with Dr. Percival Spanish after echo (2/16).

## 2022-09-10 NOTE — Telephone Encounter (Signed)
Pt advised a request will be sent to reschedule echo as soon as possible. Pt will call clinic after it is scheduled, so he can make an appointment with Dr. Percival Spanish.

## 2022-09-11 ENCOUNTER — Ambulatory Visit (HOSPITAL_COMMUNITY): Payer: Medicare Other | Attending: Cardiology

## 2022-09-11 DIAGNOSIS — I3139 Other pericardial effusion (noninflammatory): Secondary | ICD-10-CM

## 2022-09-11 LAB — ECHOCARDIOGRAM COMPLETE: S' Lateral: 3.3 cm

## 2022-09-14 ENCOUNTER — Other Ambulatory Visit: Payer: Self-pay | Admitting: *Deleted

## 2022-09-14 ENCOUNTER — Institutional Professional Consult (permissible substitution) (INDEPENDENT_AMBULATORY_CARE_PROVIDER_SITE_OTHER): Payer: Medicare Other | Admitting: Thoracic Surgery (Cardiothoracic Vascular Surgery)

## 2022-09-14 ENCOUNTER — Encounter: Payer: Self-pay | Admitting: Thoracic Surgery (Cardiothoracic Vascular Surgery)

## 2022-09-14 VITALS — BP 174/88 | HR 98 | Resp 18 | Ht 70.0 in | Wt 180.0 lb

## 2022-09-14 DIAGNOSIS — I3139 Other pericardial effusion (noninflammatory): Secondary | ICD-10-CM

## 2022-09-14 DIAGNOSIS — Z5181 Encounter for therapeutic drug level monitoring: Secondary | ICD-10-CM

## 2022-09-14 NOTE — Progress Notes (Signed)
PCP is [name], [name], MD Referring Provider is [name], [name], MD  Chief Complaint  Patient presents with   Pericardial Effusion    ECHO 2/16    HPI: James [name] is sent for consultation regarding a pericardial effusion.  James [name] is a 77 year old man with numerous medical problems including a pericardial effusion, severe thoracic and abdominal aortic atherosclerosis, renal artery stenosis, bilateral carotid endarterectomy, DVT, hypertension, hyperlipidemia, ulcers, reflux, arthritis, and gout.  He has had a longstanding pericardial effusion.  He has chronic peripheral edema.  Recently was in the emergency room on 08/30/2021 with gastrointestinal complaints.  As part of his workup he had a CT of the chest abdomen and pelvis.  It showed a large pericardial effusion.  He left AMA at that time.  In the meantime he has become progressively more short of breath.  Now even with minimal activities.  Also has had worsening of his peripheral edema.  Repeat echocardiogram showed an increase in size of the pericardial effusion with some early signs of tamponade.   Past Medical History:  Diagnosis Date   Arthritis    Carotid artery occlusion    DVT (deep venous thrombosis) ([place]) 2008   GERD (gastroesophageal reflux disease)    takes Protonix daily   Gout    takes Allopurinol daily and Indomethacin prn   H/O renal artery stenosis    H/O: GI bleed    History of blood transfusion    History of colon polyps    History of gastric ulcer    Hyperlipidemia    takes Lipitor daily   Hypertension    takes Metoprolol and Amlodipine daily   Joint pain    Joint swelling     Past Surgical History:  Procedure Laterality Date   CAROTID ENDARTERECTOMY  2008   right CEA   CHOLECYSTECTOMY  mid 90's   COLON SURGERY     COLONOSCOPY     ENDARTERECTOMY  06/02/2012   Procedure: ENDARTERECTOMY CAROTID;  Surgeon: Serafina Mitchell, MD;  Location: [facility];  Service: Vascular;  Laterality:  Left;   ESOPHAGOGASTRODUODENOSCOPY     HERNIA REPAIR     umbilical hernia   PATCH ANGIOPLASTY  06/02/2012   Procedure: PATCH ANGIOPLASTY;  Surgeon: Serafina Mitchell, MD;  Location: [facility];  Service: Vascular;  Laterality: Left;  using Vascu-Guard Patch   PERIPHERAL VASCULAR CATHETERIZATION Right 12/03/2015   Procedure: Peripheral Vascular Balloon Angioplasty;  Surgeon: Serafina Mitchell, MD;  Location: [facility];  Service: Cardiovascular;  Laterality: Right;  RENAL unsuccessful   RENAL ARTERY STENT  2009   Right renal artery stenting-  Right Kidney   small intestine removed  2001   d/t random gi bleeding    Family History  Problem Relation Age of Onset   Hypertension Mother    Hyperlipidemia Mother    Other Mother        varicose veins   Hyperlipidemia Father    Hypertension Father    Diabetes Father    Clotting disorder Father    Deep vein thrombosis Father    Hypertension Sister    Diabetes Sister    Hyperlipidemia Sister    Varicose Veins Sister    Other Sister        Bleeding problems   Hypertension Brother    Hyperlipidemia Brother     Social History Social History   Tobacco Use   Smoking status: Former    Packs/day: 1.50    Years: 36.00  Total pack years: 54.00    Types: Cigarettes    Quit date: 12/26/1999    Years since quitting: 22.7   Smokeless tobacco: Never  Vaping Use   Vaping Use: Never used  Substance Use Topics   Alcohol use: No    Comment: Quit in 1998   Drug use: No    Current Outpatient Medications  Medication Sig Dispense Refill   allopurinol (ZYLOPRIM) 100 MG tablet Take 2 tablets (200 mg total) by mouth daily. (Patient taking differently: Take 200 mg by mouth 2 (two) times daily.) 180 tablet 3   amLODipine-atorvastatin (CADUET) 10-40 MG tablet Take 1 tablet by mouth daily. (Patient taking differently: Take 1 tablet by mouth every evening.) 90 tablet 3   aspirin 81 MG tablet Take 81 mg by mouth daily.     Cholecalciferol (VITAMIN D3)  50 MCG (2000 UT) TABS Take 2,000 Units by mouth daily.     cloNIDine (CATAPRES) 0.2 MG tablet Take 0.3 mg by mouth 3 (three) times daily.     donepezil (ARICEPT) 5 MG tablet Take 1 tablet (5 mg total) by mouth at bedtime. 90 tablet 3   doxazosin (CARDURA) 2 MG tablet Take 4 mg by mouth 2 (two) times daily.     ferrous sulfate 325 (65 FE) MG tablet Take 325 mg by mouth in the morning and at bedtime.     furosemide (LASIX) 40 MG tablet Take 80 mg by mouth 2 (two) times daily.     potassium chloride (MICRO-K) 10 MEQ CR capsule Take 10 mEq by mouth daily.     sertraline (ZOLOFT) 50 MG tablet Take 1 tablet (50 mg total) by mouth daily. 90 tablet 3   No current facility-administered medications for this visit.    Allergies  Allergen Reactions   Plavix [Clopidogrel Bisulfate] Other (See Comments)    GI Bleed   Uloric [Febuxostat] Other (See Comments)    Unknown reaction    Review of Systems  Constitutional:  Positive for activity change and fatigue. Negative for unexpected weight change.  HENT:  Negative for trouble swallowing and voice change.   Respiratory:  Positive for shortness of breath.   Cardiovascular:  Positive for chest pain (With belching, recent ED visit) and leg swelling. Negative for palpitations.  Gastrointestinal:  Positive for abdominal distention and abdominal pain.  Musculoskeletal:  Positive for arthralgias and joint swelling.  Neurological:  Negative for syncope and weakness.  Hematological:  Negative for adenopathy. Does not bruise/bleed easily.  All other systems reviewed and are negative.   BP (!) 174/88 (BP Location: Right Arm, Patient Position: Sitting)   Pulse 98   Resp 18   Ht 5' 10"$  (1.778 m)   Wt 180 lb (81.6 kg)   SpO2 92% Comment: RA  BMI 25.83 kg/m  Physical Exam Vitals reviewed.  Constitutional:      General: He is not in acute distress.    Appearance: Normal appearance.  HENT:     Head: Normocephalic and atraumatic.  Eyes:     General: No  scleral icterus.    Extraocular Movements: Extraocular movements intact.  Neck:     Comments: + JVD, well-healed surgical scars bilaterally Cardiovascular:     Rate and Rhythm: Normal rate and regular rhythm.     Heart sounds: Normal heart sounds. No murmur heard.    No friction rub. No gallop.  Pulmonary:     Effort: Pulmonary effort is normal. No respiratory distress.     Breath sounds: Normal breath  sounds. No wheezing.  Abdominal:     General: There is no distension.     Palpations: Abdomen is soft.  Musculoskeletal:     Right lower leg: Edema (2-3+) present.     Left lower leg: Edema (2-3+) present.  Lymphadenopathy:     Cervical: No cervical adenopathy.  Skin:    General: Skin is warm and dry.  Neurological:     General: No focal deficit present.     Mental Status: He is alert and oriented to person, place, and time.     Cranial Nerves: No cranial nerve deficit.     Motor: No weakness.     Diagnostic Tests: Echocardiogram 09/11/2022 IMPRESSIONS     1. Left ventricular ejection fraction, by estimation, is 55 to 60%. The  left ventricle has normal function. The left ventricle has no regional  wall motion abnormalities. There is mild concentric left ventricular  hypertrophy. Left ventricular diastolic  parameters are indeterminate.   2. Right ventricular systolic function is normal. The right ventricular  size is normal. Tricuspid regurgitation signal is inadequate for assessing  PA pressure.   3. Left atrial size was moderately dilated.   4. The mitral valve is normal in structure. No evidence of mitral valve  regurgitation. No evidence of mitral stenosis.   5. The aortic valve is tricuspid. There is mild calcification of the  aortic valve. Aortic valve regurgitation is not visualized. No aortic  stenosis is present.   6. The inferior vena cava is dilated in size with <50% respiratory  variability, suggesting right atrial pressure of 15 mmHg.   7. 28%  respirophasic variation of mitral E inflow velocity (difficult  with irregular rhythm, possible atrial fibrillation). Slight RV diastolic  indentation that looks worse in subcostal views and slight right atrial  indentation. The IVC is dilated. I  suspect this is at least early tamponade physiology. Large pericardial  effusion. The pericardial effusion is circumferential.   8. The patient appears to be in atrial fibrillation.   CT CHEST, ABDOMEN AND PELVIS WITHOUT CONTRAST   TECHNIQUE: Multidetector CT imaging of the chest, abdomen and pelvis was performed following the standard protocol without IV contrast.   RADIATION DOSE REDUCTION: This exam was performed according to the departmental dose-optimization program which includes automated exposure control, adjustment of the mA and/or kV according to patient size and/or use of iterative reconstruction technique.   COMPARISON:  None Available.   FINDINGS: CT CHEST FINDINGS   Cardiovascular: Moderate cardiac enlargement. Large pericardial effusion is identified measuring up to 4.3 cm in thickness, image 42/2. Aortic atherosclerosis. Normal caliber thoracic aorta. Coronary artery calcifications.   Mediastinum/Nodes: Thyroid gland, trachea, and esophagus demonstrate no significant findings. No enlarged axillary, or mediastinal lymph nodes. Hilar lymph nodes are suboptimally evaluated due to lack of IV contrast.   Lungs/Pleura: Small bilateral pleural effusions. There is compressive type atelectasis involving the lingula and left lower lobe secondary to cardiac enlargement and large pericardial effusion. Mild atelectasis within the posterior right lung base. Small scattered lung nodules are identified within both upper lobes. This includes:   -lateral right upper lobe lung nodule measures 3 mm, image 35/3.   -Ground-glass lateral left upper lobe lung nodule measures 4 mm, image 18/3.   -Ground-glass nodule within the posterior  right upper lobe measures 0.7 cm, image 31/3.   Musculoskeletal: No chest wall mass or suspicious bone lesions identified. Bilateral gynecomastia.   CT ABDOMEN PELVIS FINDINGS   Hepatobiliary: No focal liver abnormality  is seen. Status post cholecystectomy. No biliary dilatation.   Pancreas: No main duct dilatation or inflammation. A few scattered parenchymal calcifications noted.   Spleen: Normal in size without focal abnormality.   Adrenals/Urinary Tract: Normal adrenal glands. Unchanged appearance of previously characterized Bosniak class 1 and Bosniak class 2 cysts noted within both kidneys. No follow-up imaging recommended. No nephrolithiasis or hydronephrosis. Urinary bladder is unremarkable.   Stomach/Bowel: Stomach appears normal. The appendix is visualized and appears normal. Moderate retained stool identified throughout the colon. Sigmoid diverticulosis without signs of diverticulitis. No bowel wall thickening, inflammation or distension.   Vascular/Lymphatic: Aortic atherosclerosis. No aneurysm. No abdominopelvic adenopathy.   Reproductive: Prostate gland enlargement.   Other: Right inguinal hernia is identified containing fat and a small amount of fluid, image 52/5. No focal fluid collections. No signs of pneumoperitoneum.   Musculoskeletal: No acute or suspicious osseous findings.   IMPRESSION: 1. Cardiac enlargement with large pericardial effusion. 2. No signs of aortic aneurysm. 3. Small bilateral pleural effusions with compressive type atelectasis involving the lingula and left lower lobe. 4. Multiple pulmonary nodules. Most significant: 7 mm right ground-glass pulmonary nodule within the upper lobe. Per Fleischner Society Guidelines, recommend a non-contrast Chest CT at 3-6 months. Subsequent management based on the most suspicious nodule(s). These guidelines do not apply to immunocompromised patients and patients with cancer. Follow up in patients  with significant comorbidities as clinically warranted. For lung cancer screening, adhere to Lung-RADS guidelines. Reference: Radiology. 2017; 284(1):228-43. 5. Moderate retained stool identified throughout the colon. Correlate for any clinical signs or symptoms of constipation. 6. Prostate gland enlargement. 7. Right inguinal hernia containing fat and a small amount of fluid. 8.  Aortic Atherosclerosis (ICD10-I70.0).     Electronically Signed   By: Kerby Moors M.D.   On: 08/30/2022 10:36 I personally reviewed the echo and CT images.  There is a large pericardial effusion.  Multiple small lung nodules and small bilateral pleural effusions.  Severe aortic atherosclerosis involving thoracic and descending aorta.  Impression: James [name] is a 77 year old man with numerous medical problems including a pericardial effusion, severe thoracic and abdominal aortic atherosclerosis, renal artery stenosis, bilateral carotid endarterectomy, DVT, hypertension, hyperlipidemia, ulcers, reflux, arthritis, and gout.  Has been followed for a large pericardial effusion.  Recently has had worsening symptomatology with increasing shortness of breath with even minimal exertion and peripheral edema recent echocardiogram and CT chest showed a large pericardial effusion.  On echo there were early signs of cardiac tamponade.  I recommended we proceed with a subxiphoid pericardial window.  A subxiphoid approach would be preferred because of the large effusions on both sides and I want to make sure we can completely drain the effusion.  He and his son understand that the effusion can potentially recur no matter how we approach it.  I informed them of the general nature of the procedure including the need for general anesthesia, the incisions to be used, need for drainage tube postoperatively, the expected hospital stay, and the overall recovery.  I informed them of the indications, risks, benefits, and  alternatives.  They understand the risks include, but not limited to death, MI, DVT, PE, bleeding, possible need for transfusion or conversion to sternotomy, infection, renal failure, as well as possibility of other unforeseeable complications.  He accepts the risks and wishes to proceed.  Plan: Subxiphoid pericardial window on Monday, 09/21/2022 He was instructed to go to the emergency room if his symptoms were to worsen in the meantime.  Melrose Nakayama, MD [facility] [phone]

## 2022-09-14 NOTE — H&P (View-Only) (Signed)
PCP is [name], [name], MD Referring Provider is [name], [name], MD  Chief Complaint  Patient presents with   Pericardial Effusion    ECHO 2/16    HPI: James [name] is sent for consultation regarding a pericardial effusion.  James [name] is a 77 year old man with numerous medical problems including a pericardial effusion, severe thoracic and abdominal aortic atherosclerosis, renal artery stenosis, bilateral carotid endarterectomy, DVT, hypertension, hyperlipidemia, ulcers, reflux, arthritis, and gout.  He has had a longstanding pericardial effusion.  He has chronic peripheral edema.  Recently was in the emergency room on 08/30/2021 with gastrointestinal complaints.  As part of his workup he had a CT of the chest abdomen and pelvis.  It showed a large pericardial effusion.  He left AMA at that time.  In the meantime he has become progressively more short of breath.  Now even with minimal activities.  Also has had worsening of his peripheral edema.  Repeat echocardiogram showed an increase in size of the pericardial effusion with some early signs of tamponade.   Past Medical History:  Diagnosis Date   Arthritis    Carotid artery occlusion    DVT (deep venous thrombosis) ([place]) 2008   GERD (gastroesophageal reflux disease)    takes Protonix daily   Gout    takes Allopurinol daily and Indomethacin prn   H/O renal artery stenosis    H/O: GI bleed    History of blood transfusion    History of colon polyps    History of gastric ulcer    Hyperlipidemia    takes Lipitor daily   Hypertension    takes Metoprolol and Amlodipine daily   Joint pain    Joint swelling     Past Surgical History:  Procedure Laterality Date   CAROTID ENDARTERECTOMY  2008   right CEA   CHOLECYSTECTOMY  mid 90's   COLON SURGERY     COLONOSCOPY     ENDARTERECTOMY  06/02/2012   Procedure: ENDARTERECTOMY CAROTID;  Surgeon: Serafina Mitchell, MD;  Location: [place];  Service: Vascular;  Laterality:  Left;   ESOPHAGOGASTRODUODENOSCOPY     HERNIA REPAIR     umbilical hernia   PATCH ANGIOPLASTY  06/02/2012   Procedure: PATCH ANGIOPLASTY;  Surgeon: Serafina Mitchell, MD;  Location: [place];  Service: Vascular;  Laterality: Left;  using Vascu-Guard Patch   PERIPHERAL VASCULAR CATHETERIZATION Right 12/03/2015   Procedure: Peripheral Vascular Balloon Angioplasty;  Surgeon: Serafina Mitchell, MD;  Location: [facility];  Service: Cardiovascular;  Laterality: Right;  RENAL unsuccessful   RENAL ARTERY STENT  2009   Right renal artery stenting-  Right Kidney   small intestine removed  2001   d/t random gi bleeding    Family History  Problem Relation Age of Onset   Hypertension Mother    Hyperlipidemia Mother    Other Mother        varicose veins   Hyperlipidemia Father    Hypertension Father    Diabetes Father    Clotting disorder Father    Deep vein thrombosis Father    Hypertension Sister    Diabetes Sister    Hyperlipidemia Sister    Varicose Veins Sister    Other Sister        Bleeding problems   Hypertension Brother    Hyperlipidemia Brother     Social History Social History   Tobacco Use   Smoking status: Former    Packs/day: 1.50    Years: 36.00  Total pack years: 54.00    Types: Cigarettes    Quit date: 12/26/1999    Years since quitting: 22.7   Smokeless tobacco: Never  Vaping Use   Vaping Use: Never used  Substance Use Topics   Alcohol use: No    Comment: Quit in 1998   Drug use: No    Current Outpatient Medications  Medication Sig Dispense Refill   allopurinol (ZYLOPRIM) 100 MG tablet Take 2 tablets (200 mg total) by mouth daily. (Patient taking differently: Take 200 mg by mouth 2 (two) times daily.) 180 tablet 3   amLODipine-atorvastatin (CADUET) 10-40 MG tablet Take 1 tablet by mouth daily. (Patient taking differently: Take 1 tablet by mouth every evening.) 90 tablet 3   aspirin 81 MG tablet Take 81 mg by mouth daily.     Cholecalciferol (VITAMIN D3)  50 MCG (2000 UT) TABS Take 2,000 Units by mouth daily.     cloNIDine (CATAPRES) 0.2 MG tablet Take 0.3 mg by mouth 3 (three) times daily.     donepezil (ARICEPT) 5 MG tablet Take 1 tablet (5 mg total) by mouth at bedtime. 90 tablet 3   doxazosin (CARDURA) 2 MG tablet Take 4 mg by mouth 2 (two) times daily.     ferrous sulfate 325 (65 FE) MG tablet Take 325 mg by mouth in the morning and at bedtime.     furosemide (LASIX) 40 MG tablet Take 80 mg by mouth 2 (two) times daily.     potassium chloride (MICRO-K) 10 MEQ CR capsule Take 10 mEq by mouth daily.     sertraline (ZOLOFT) 50 MG tablet Take 1 tablet (50 mg total) by mouth daily. 90 tablet 3   No current facility-administered medications for this visit.    Allergies  Allergen Reactions   Plavix [Clopidogrel Bisulfate] Other (See Comments)    GI Bleed   Uloric [Febuxostat] Other (See Comments)    Unknown reaction    Review of Systems  Constitutional:  Positive for activity change and fatigue. Negative for unexpected weight change.  HENT:  Negative for trouble swallowing and voice change.   Respiratory:  Positive for shortness of breath.   Cardiovascular:  Positive for chest pain (With belching, recent ED visit) and leg swelling. Negative for palpitations.  Gastrointestinal:  Positive for abdominal distention and abdominal pain.  Musculoskeletal:  Positive for arthralgias and joint swelling.  Neurological:  Negative for syncope and weakness.  Hematological:  Negative for adenopathy. Does not bruise/bleed easily.  All other systems reviewed and are negative.   BP (!) 174/88 (BP Location: Right Arm, Patient Position: Sitting)   Pulse 98   Resp 18   Ht '5\' 10"'$  (1.778 m)   Wt 180 lb (81.6 kg)   SpO2 92% Comment: RA  BMI 25.83 kg/m  Physical Exam Vitals reviewed.  Constitutional:      General: He is not in acute distress.    Appearance: Normal appearance.  HENT:     Head: Normocephalic and atraumatic.  Eyes:     General: No  scleral icterus.    Extraocular Movements: Extraocular movements intact.  Neck:     Comments: + JVD, well-healed surgical scars bilaterally Cardiovascular:     Rate and Rhythm: Normal rate and regular rhythm.     Heart sounds: Normal heart sounds. No murmur heard.    No friction rub. No gallop.  Pulmonary:     Effort: Pulmonary effort is normal. No respiratory distress.     Breath sounds: Normal breath  sounds. No wheezing.  Abdominal:     General: There is no distension.     Palpations: Abdomen is soft.  Musculoskeletal:     Right lower leg: Edema (2-3+) present.     Left lower leg: Edema (2-3+) present.  Lymphadenopathy:     Cervical: No cervical adenopathy.  Skin:    General: Skin is warm and dry.  Neurological:     General: No focal deficit present.     Mental Status: He is alert and oriented to person, place, and time.     Cranial Nerves: No cranial nerve deficit.     Motor: No weakness.     Diagnostic Tests: Echocardiogram 09/11/2022 IMPRESSIONS     1. Left ventricular ejection fraction, by estimation, is 55 to 60%. The  left ventricle has normal function. The left ventricle has no regional  wall motion abnormalities. There is mild concentric left ventricular  hypertrophy. Left ventricular diastolic  parameters are indeterminate.   2. Right ventricular systolic function is normal. The right ventricular  size is normal. Tricuspid regurgitation signal is inadequate for assessing  PA pressure.   3. Left atrial size was moderately dilated.   4. The mitral valve is normal in structure. No evidence of mitral valve  regurgitation. No evidence of mitral stenosis.   5. The aortic valve is tricuspid. There is mild calcification of the  aortic valve. Aortic valve regurgitation is not visualized. No aortic  stenosis is present.   6. The inferior vena cava is dilated in size with <50% respiratory  variability, suggesting right atrial pressure of 15 mmHg.   7. 28%  respirophasic variation of mitral E inflow velocity (difficult  with irregular rhythm, possible atrial fibrillation). Slight RV diastolic  indentation that looks worse in subcostal views and slight right atrial  indentation. The IVC is dilated. I  suspect this is at least early tamponade physiology. Large pericardial  effusion. The pericardial effusion is circumferential.   8. The patient appears to be in atrial fibrillation.   CT CHEST, ABDOMEN AND PELVIS WITHOUT CONTRAST   TECHNIQUE: Multidetector CT imaging of the chest, abdomen and pelvis was performed following the standard protocol without IV contrast.   RADIATION DOSE REDUCTION: This exam was performed according to the departmental dose-optimization program which includes automated exposure control, adjustment of the mA and/or kV according to patient size and/or use of iterative reconstruction technique.   COMPARISON:  None Available.   FINDINGS: CT CHEST FINDINGS   Cardiovascular: Moderate cardiac enlargement. Large pericardial effusion is identified measuring up to 4.3 cm in thickness, image 42/2. Aortic atherosclerosis. Normal caliber thoracic aorta. Coronary artery calcifications.   Mediastinum/Nodes: Thyroid gland, trachea, and esophagus demonstrate no significant findings. No enlarged axillary, or mediastinal lymph nodes. Hilar lymph nodes are suboptimally evaluated due to lack of IV contrast.   Lungs/Pleura: Small bilateral pleural effusions. There is compressive type atelectasis involving the lingula and left lower lobe secondary to cardiac enlargement and large pericardial effusion. Mild atelectasis within the posterior right lung base. Small scattered lung nodules are identified within both upper lobes. This includes:   -lateral right upper lobe lung nodule measures 3 mm, image 35/3.   -Ground-glass lateral left upper lobe lung nodule measures 4 mm, image 18/3.   -Ground-glass nodule within the posterior  right upper lobe measures 0.7 cm, image 31/3.   Musculoskeletal: No chest wall mass or suspicious bone lesions identified. Bilateral gynecomastia.   CT ABDOMEN PELVIS FINDINGS   Hepatobiliary: No focal liver abnormality  is seen. Status post cholecystectomy. No biliary dilatation.   Pancreas: No main duct dilatation or inflammation. A few scattered parenchymal calcifications noted.   Spleen: Normal in size without focal abnormality.   Adrenals/Urinary Tract: Normal adrenal glands. Unchanged appearance of previously characterized Bosniak class 1 and Bosniak class 2 cysts noted within both kidneys. No follow-up imaging recommended. No nephrolithiasis or hydronephrosis. Urinary bladder is unremarkable.   Stomach/Bowel: Stomach appears normal. The appendix is visualized and appears normal. Moderate retained stool identified throughout the colon. Sigmoid diverticulosis without signs of diverticulitis. No bowel wall thickening, inflammation or distension.   Vascular/Lymphatic: Aortic atherosclerosis. No aneurysm. No abdominopelvic adenopathy.   Reproductive: Prostate gland enlargement.   Other: Right inguinal hernia is identified containing fat and a small amount of fluid, image 52/5. No focal fluid collections. No signs of pneumoperitoneum.   Musculoskeletal: No acute or suspicious osseous findings.   IMPRESSION: 1. Cardiac enlargement with large pericardial effusion. 2. No signs of aortic aneurysm. 3. Small bilateral pleural effusions with compressive type atelectasis involving the lingula and left lower lobe. 4. Multiple pulmonary nodules. Most significant: 7 mm right ground-glass pulmonary nodule within the upper lobe. Per Fleischner Society Guidelines, recommend a non-contrast Chest CT at 3-6 months. Subsequent management based on the most suspicious nodule(s). These guidelines do not apply to immunocompromised patients and patients with cancer. Follow up in patients  with significant comorbidities as clinically warranted. For lung cancer screening, adhere to Lung-RADS guidelines. Reference: Radiology. 2017; 284(1):228-43. 5. Moderate retained stool identified throughout the colon. Correlate for any clinical signs or symptoms of constipation. 6. Prostate gland enlargement. 7. Right inguinal hernia containing fat and a small amount of fluid. 8.  Aortic Atherosclerosis (ICD10-I70.0).     Electronically Signed   By: Kerby Moors M.D.   On: 08/30/2022 10:36 I personally reviewed the echo and CT images.  There is a large pericardial effusion.  Multiple small lung nodules and small bilateral pleural effusions.  Severe aortic atherosclerosis involving thoracic and descending aorta.  Impression: James [name] is a 77 year old man with numerous medical problems including a pericardial effusion, severe thoracic and abdominal aortic atherosclerosis, renal artery stenosis, bilateral carotid endarterectomy, DVT, hypertension, hyperlipidemia, ulcers, reflux, arthritis, and gout.  Has been followed for a large pericardial effusion.  Recently has had worsening symptomatology with increasing shortness of breath with even minimal exertion and peripheral edema recent echocardiogram and CT chest showed a large pericardial effusion.  On echo there were early signs of cardiac tamponade.  I recommended we proceed with a subxiphoid pericardial window.  A subxiphoid approach would be preferred because of the large effusions on both sides and I want to make sure we can completely drain the effusion.  He and his son understand that the effusion can potentially recur no matter how we approach it.  I informed them of the general nature of the procedure including the need for general anesthesia, the incisions to be used, need for drainage tube postoperatively, the expected hospital stay, and the overall recovery.  I informed them of the indications, risks, benefits, and  alternatives.  They understand the risks include, but not limited to death, MI, DVT, PE, bleeding, possible need for transfusion or conversion to sternotomy, infection, renal failure, as well as possibility of other unforeseeable complications.  He accepts the risks and wishes to proceed.  Plan: Subxiphoid pericardial window on Monday, 09/21/2022 He was instructed to go to the emergency room if his symptoms were to worsen in the meantime.  Melrose Nakayama, MD [facility] [phone]

## 2022-09-15 NOTE — Pre-Procedure Instructions (Signed)
Surgical Instructions    Your procedure is scheduled on September 21, 2022.  Report to [facility] Main Entrance "A" at 5:30 A.M., then check in with the Admitting office.  Call this number if you have problems the morning of surgery:  [phone]  If you have any questions prior to your surgery date call [phone]: Open Monday-Friday 8am-4pm If you experience any cold or flu symptoms such as cough, fever, chills, shortness of breath, etc. between now and your scheduled surgery, please notify us at the above number.     Remember:  Do not eat or drink after midnight the night before your surgery    Take these medicines the morning of surgery with A SIP OF WATER:  allopurinol (ZYLOPRIM)   cloNIDine (CATAPRES   doxazosin (CARDURA)   sertraline (ZOLOFT)    Continue to take your Aspirin until the day before surgery. DO NOT take any the morning of surgery.   As of today, STOP taking any Aleve, Naproxen, Ibuprofen, Motrin, Advil, Goody's, BC's, all herbal medications, fish oil, and all vitamins.   Continue to take your potassium supplement until the day before surgery. DO NOT take any morning of surgery.                     Do NOT Smoke (Tobacco/Vaping) for 24 hours prior to your procedure.  If you use a CPAP at night, you may bring your mask/headgear for your overnight stay.   Contacts, glasses, piercing's, hearing aid's, dentures or partials may not be worn into surgery, please bring cases for these belongings.    For patients admitted to the hospital, discharge time will be determined by your treatment team.   Patients discharged the day of surgery will not be allowed to drive home, and someone needs to stay with them for 24 hours.  SURGICAL WAITING ROOM VISITATION Patients having surgery or a procedure may have no more than 2 support people in the waiting area - these visitors may rotate.   Children under the age of 37 must have an adult with them who is not the  patient. If the patient needs to stay at the hospital during part of their recovery, the visitor guidelines for inpatient rooms apply. Pre-op nurse will coordinate an appropriate time for 1 support person to accompany patient in pre-op.  This support person may not rotate.   Please refer to the [facility] website for the visitor guidelines for Inpatients (after your surgery is over and you are in a regular room).    Special instructions:   [place]- Preparing For Surgery  Before surgery, you can play an important role. Because skin is not sterile, your skin needs to be as free of germs as possible. You can reduce the number of germs on your skin by washing with CHG (chlorahexidine gluconate) Soap before surgery.  CHG is an antiseptic cleaner which kills germs and bonds with the skin to continue killing germs even after washing.    Oral Hygiene is also important to reduce your risk of infection.  Remember - BRUSH YOUR TEETH THE MORNING OF SURGERY WITH YOUR REGULAR TOOTHPASTE  Please do not use if you have an allergy to CHG or antibacterial soaps. If your skin becomes reddened/irritated stop using the CHG.  Do not shave (including legs and underarms) for at least 48 hours prior to first CHG shower. It is OK to shave your face.  Please follow these instructions carefully.   Shower the [employer]  SURGERY and the MORNING OF SURGERY  If you chose to wash your hair, wash your hair first as usual with your normal shampoo.  After you shampoo, rinse your hair and body thoroughly to remove the shampoo.  Use CHG Soap as you would any other liquid soap. You can apply CHG directly to the skin and wash gently with a scrungie or a clean washcloth.   Apply the CHG Soap to your body ONLY FROM THE NECK DOWN.  Do not use on open wounds or open sores. Avoid contact with your eyes, ears, mouth and genitals (private parts). Wash Face and genitals (private parts)  with your normal soap.   Wash thoroughly,  paying special attention to the area where your surgery will be performed.  Thoroughly rinse your body with warm water from the neck down.  DO NOT shower/wash with your normal soap after using and rinsing off the CHG Soap.  Pat yourself dry with a CLEAN TOWEL.  Wear CLEAN PAJAMAS to bed the night before surgery  Place CLEAN SHEETS on your bed the night before your surgery  DO NOT SLEEP WITH PETS.   Day of Surgery: Take a shower with CHG soap. Do not wear jewelry or makeup Do not wear lotions, powders, perfumes/colognes, or deodorant. Do not shave 48 hours prior to surgery.  Men may shave face and neck. Do not bring valuables to the hospital.  [facility] is not responsible for any belongings or valuables. Do not wear nail polish, gel polish, artificial nails, or any other type of covering on natural nails (fingers and toes) If you have artificial nails or gel coating that need to be removed by a nail salon, please have this removed prior to surgery. Artificial nails or gel coating may interfere with anesthesia's ability to adequately monitor your vital signs. Wear Clean/Comfortable clothing the morning of surgery Remember to brush your teeth WITH YOUR REGULAR TOOTHPASTE.   Please read over the following fact sheets that you were given.    If you received a COVID test during your pre-op visit  it is requested that you wear a mask when out in public, stay away from anyone that may not be feeling well and notify your surgeon if you develop symptoms. If you have been in contact with anyone that has tested positive in the last 10 days please notify you surgeon.

## 2022-09-16 ENCOUNTER — Encounter (HOSPITAL_COMMUNITY): Payer: Self-pay

## 2022-09-16 ENCOUNTER — Other Ambulatory Visit: Payer: Self-pay

## 2022-09-16 ENCOUNTER — Encounter (HOSPITAL_COMMUNITY)
Admission: RE | Admit: 2022-09-16 | Discharge: 2022-09-16 | Disposition: A | Discharge status: Home / Self Care | Payer: Medicare Other | Source: Ambulatory Visit | Attending: Thoracic Surgery (Cardiothoracic Vascular Surgery) | Admitting: Thoracic Surgery (Cardiothoracic Vascular Surgery)

## 2022-09-16 ENCOUNTER — Ambulatory Visit (HOSPITAL_COMMUNITY)
Admission: RE | Admit: 2022-09-16 | Discharge: 2022-09-16 | Disposition: A | Discharge status: Home / Self Care | Payer: Medicare Other | Source: Ambulatory Visit | Attending: Thoracic Surgery (Cardiothoracic Vascular Surgery) | Admitting: Thoracic Surgery (Cardiothoracic Vascular Surgery)

## 2022-09-16 VITALS — BP 168/81 | HR 96 | Temp 97.6°F | Resp 18 | Ht 70.0 in | Wt 179.0 lb

## 2022-09-16 DIAGNOSIS — Z5181 Encounter for therapeutic drug level monitoring: Secondary | ICD-10-CM | POA: Insufficient documentation

## 2022-09-16 DIAGNOSIS — Z1152 Encounter for screening for COVID-19: Secondary | ICD-10-CM | POA: Insufficient documentation

## 2022-09-16 DIAGNOSIS — I3139 Other pericardial effusion (noninflammatory): Secondary | ICD-10-CM | POA: Diagnosis not present

## 2022-09-16 DIAGNOSIS — Z01818 Encounter for other preprocedural examination: Secondary | ICD-10-CM | POA: Diagnosis not present

## 2022-09-16 DIAGNOSIS — J90 Pleural effusion, not elsewhere classified: Secondary | ICD-10-CM | POA: Diagnosis not present

## 2022-09-16 HISTORY — DX: Chronic kidney disease, unspecified: N18.9

## 2022-09-16 HISTORY — DX: Sleep apnea, unspecified: G47.30

## 2022-09-16 LAB — URINALYSIS, ROUTINE W REFLEX MICROSCOPIC
Bacteria, UA: NONE SEEN
Bilirubin Urine: NEGATIVE
Glucose, UA: NEGATIVE mg/dL
Hgb urine dipstick: NEGATIVE
Ketones, ur: NEGATIVE mg/dL
Leukocytes,Ua: NEGATIVE
Nitrite: NEGATIVE
Protein, ur: 30 mg/dL — AB
Specific Gravity, Urine: 1.008 (ref 1.005–1.030)
pH: 5 (ref 5.0–8.0)

## 2022-09-16 LAB — BLOOD GAS, ARTERIAL
Acid-Base Excess: 2.7 mmol/L — ABNORMAL HIGH (ref 0.0–2.0)
Bicarbonate: 25.7 mmol/L (ref 20.0–28.0)
Drawn by: 58793
O2 Saturation: 99.9 %
Patient temperature: 37
pCO2 arterial: 33 mmHg (ref 32–48)
pH, Arterial: 7.5 — ABNORMAL HIGH (ref 7.35–7.45)
pO2, Arterial: 111 mmHg — ABNORMAL HIGH (ref 83–108)

## 2022-09-16 LAB — COMPREHENSIVE METABOLIC PANEL
ALT: 17 U/L (ref 0–44)
AST: 21 U/L (ref 15–41)
Albumin: 3.3 g/dL — ABNORMAL LOW (ref 3.5–5.0)
Alkaline Phosphatase: 166 U/L — ABNORMAL HIGH (ref 38–126)
Anion gap: 12 (ref 5–15)
BUN: 27 mg/dL — ABNORMAL HIGH (ref 8–23)
CO2: 23 mmol/L (ref 22–32)
Calcium: 8.7 mg/dL — ABNORMAL LOW (ref 8.9–10.3)
Chloride: 99 mmol/L (ref 98–111)
Creatinine, Ser: 2.59 mg/dL — ABNORMAL HIGH (ref 0.61–1.24)
GFR, Estimated: 25 mL/min — ABNORMAL LOW (ref >60)
Glucose, Bld: 109 mg/dL — ABNORMAL HIGH (ref 70–99)
Potassium: 3.2 mmol/L — ABNORMAL LOW (ref 3.5–5.1)
Sodium: 134 mmol/L — ABNORMAL LOW (ref 135–145)
Total Bilirubin: 0.9 mg/dL (ref 0.3–1.2)
Total Protein: 6.9 g/dL (ref 6.5–8.1)

## 2022-09-16 LAB — SARS CORONAVIRUS 2 (TAT 6-24 HRS): SARS Coronavirus 2: NEGATIVE

## 2022-09-16 LAB — PROTIME-INR
INR: 1.1 (ref 0.8–1.2)
Prothrombin Time: 13.6 seconds (ref 11.4–15.2)

## 2022-09-16 LAB — SURGICAL PCR SCREEN
MRSA, PCR: NEGATIVE
Staphylococcus aureus: NEGATIVE

## 2022-09-16 LAB — TYPE AND SCREEN
ABO/RH(D): O NEG
Antibody Screen: NEGATIVE

## 2022-09-16 LAB — CBC
HCT: 32.1 % — ABNORMAL LOW (ref 39.0–52.0)
Hemoglobin: 10.4 g/dL — ABNORMAL LOW (ref 13.0–17.0)
MCH: 27.2 pg (ref 26.0–34.0)
MCHC: 32.4 g/dL (ref 30.0–36.0)
MCV: 83.8 fL (ref 80.0–100.0)
Platelets: 430 10*3/uL — ABNORMAL HIGH (ref 150–400)
RBC: 3.83 MIL/uL — ABNORMAL LOW (ref 4.22–5.81)
RDW: 14.7 % (ref 11.5–15.5)
WBC: 9.3 10*3/uL (ref 4.0–10.5)
nRBC: 0 % (ref 0.0–0.2)

## 2022-09-16 LAB — APTT: aPTT: 38 seconds — ABNORMAL HIGH (ref 24–36)

## 2022-09-16 NOTE — Progress Notes (Signed)
PCP - [name], Vonna Kotyk, MD Cardiologist - Minus Breeding, MD  PPM/ICD - denies Device Orders - n/a Rep Notified - n/a  Chest x-ray - 09/16/22 EKG - 08/30/22 Stress Test - denies ECHO - 09/11/22 Cardiac Cath - denies  Sleep Study - yes CPAP - no at this time  Fasting Blood Sugar - n/a  Blood Thinner Instructions: n/a Aspirin Instructions - hold the day of surgery  ERAS Protcol - n/a  COVID TEST- done in PAT on 09/16/22   Anesthesia review: yes - cardiac history.   Patient denies shortness of breath, fever, cough and chest pain at PAT appointment   All instructions explained to the patient, with a verbal understanding of the material. Patient agrees to go over the instructions while at home for a better understanding. Patient also instructed to self quarantine after being tested for COVID-19. The opportunity to ask questions was provided.

## 2022-09-16 NOTE — Progress Notes (Signed)
Abnormal labs in PAT. Dr. Roxan Hockey office was notified Levonne Spiller, RN).

## 2022-09-17 ENCOUNTER — Encounter: Payer: Medicare Other | Admitting: Cardiothoracic Surgery

## 2022-09-17 ENCOUNTER — Other Ambulatory Visit (HOSPITAL_COMMUNITY): Payer: Medicare Other

## 2022-09-17 ENCOUNTER — Other Ambulatory Visit: Payer: Self-pay | Admitting: *Deleted

## 2022-09-17 DIAGNOSIS — I3139 Other pericardial effusion (noninflammatory): Secondary | ICD-10-CM

## 2022-09-17 NOTE — Anesthesia Preprocedure Evaluation (Addendum)
Anesthesia Evaluation  Patient identified by MRN, date of birth, ID band Patient awake    Reviewed: Allergy & Precautions, NPO status , Patient's Chart, lab work & pertinent test results  Airway Mallampati: II  TM Distance: >3 FB Neck ROM: Full    Dental  (+) Teeth Intact, Dental Advisory Given   Pulmonary sleep apnea , former smoker   breath sounds clear to auscultation       Cardiovascular hypertension, Pt. on medications + Peripheral Vascular Disease   Rhythm:Regular Rate:Normal  Echo 09/11/22: IMPRESSIONS  1. Left ventricular ejection fraction, by estimation, is 55 to 60%. The  left ventricle has normal function. The left ventricle has no regional  wall motion abnormalities. There is mild concentric left ventricular  hypertrophy. Left ventricular diastolic  parameters are indeterminate.  2. Right ventricular systolic function is normal. The right ventricular  size is normal. Tricuspid regurgitation signal is inadequate for assessing  PA pressure.  3. Left atrial size was moderately dilated.  4. The mitral valve is normal in structure. No evidence of mitral valve  regurgitation. No evidence of mitral stenosis.  5. The aortic valve is tricuspid. There is mild calcification of the  aortic valve. Aortic valve regurgitation is not visualized. No aortic  stenosis is present.  6. The inferior vena cava is dilated in size with <50% respiratory  variability, suggesting right atrial pressure of 15 mmHg.  7. 28% respirophasic variation of mitral E inflow velocity (difficult  with irregular rhythm, possible atrial fibrillation). Slight RV diastolic  indentation that looks worse in subcostal views and slight right atrial  indentation. The IVC is dilated. I  suspect this is at least early tamponade physiology. Large pericardial  effusion. The pericardial effusion is circumferential.  8. The patient appears to be in atrial  fibrillation.      Neuro/Psych negative neurological ROS  negative psych ROS   GI/Hepatic ,GERD  ,,  Endo/Other  negative endocrine ROS    Renal/GU Renal disease     Musculoskeletal  (+) Arthritis ,    Abdominal   Peds  Hematology negative hematology ROS (+)   Anesthesia Other Findings   Reproductive/Obstetrics                             Anesthesia Physical Anesthesia Plan  ASA: 4  Anesthesia Plan: General   Post-op Pain Management: Tylenol PO (pre-op)*   Induction: Intravenous  PONV Risk Score and Plan: 3 and Ondansetron, Dexamethasone and Midazolam  Airway Management Planned: Oral ETT  Additional Equipment: Arterial line, CVP and Ultrasound Guidance Line Placement  Intra-op Plan:   Post-operative Plan: Extubation in OR  Informed Consent: I have reviewed the patients History and Physical, chart, labs and discussed the procedure including the risks, benefits and alternatives for the proposed anesthesia with the patient or authorized representative who has indicated his/her understanding and acceptance.     Dental advisory given  Plan Discussed with: CRNA  Anesthesia Plan Comments: (PAT note written 09/17/2022 by Myra Gianotti, PA-C. Possible new afib pattern on 09/11/22 echo (Dr. Percival Spanish aware) that showed large pericardial effusion with early tamponade pattern. Referred for pericardial window.  )       Anesthesia Quick Evaluation

## 2022-09-17 NOTE — Progress Notes (Signed)
Anesthesia Chart Review:  Case: [id number] Date/Time: 09/21/22 0715   Procedures:      SUBXYPHOID PERICARDIAL WINDOW (Chest)     TRANSESOPHAGEAL ECHOCARDIOGRAM   Anesthesia type: General   Pre-op diagnosis: PERICARDIAL EFFUSION   Location: [facility] OR ROOM 17 / [place] OR   Surgeons: Melrose Nakayama, MD       DISCUSSION: Patient is a 77 year old male scheduled for the above procedure. He has had a large pericardial effusion since at least March 2022. Patient opted for conservative management, but more recently he has become more symptomatic. He declined admission from ED 08/30/21. He contacted his cardiologist Dr. Percival Spanish who ordered a repeat echo that was done on 09/11/22 and suggested an early tamponade physiology. He also had the appearance of being in afib (new). Dr. Percival Spanish referred to [facility] for consideration of pericardial window.    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  09/16/22 preoperative CXR is still pending. He had possible new afib during 09/11/22 echocardiogram (Dr. Percival Spanish aware). Will defer to anesthesiologist if repeat EKG desired before or after procedure. 09/16/22 presurgical COVID-19 test was negative. Anesthesia team to evaluate on the day of surgery.    VS: BP (!) 168/81   Pulse 96   Temp 36.4 C   Resp 18   Ht 5' 10"$  (1.778 m)   Wt 81.2 kg   SpO2 95%   BMI 25.68 kg/m    PROVIDERS: [name], [name], MD is PCP  Minus Breeding, MD is cardiologist. Last visit 05/19/22. Brabham, V. Well, MD is vascular surgeon Chesley Mires, MD is pulmonologist Lawson Radar, MD is nephrologist Festus Aloe, MD is  urologist   LABS: Preoperative labs noted. H/H 10.4/32.1, previous HGB 10.8-11.7 and HCT 32.7-35.3 since 08/21/22. Creatinine 2.51, previously 2.27-2.79 since July 2022 per [facility] labs (all labs ordered are listed, but only abnormal results are displayed)  Labs Reviewed  CBC - Abnormal; Notable for the following components:      Result Value   RBC 3.83 (*)    Hemoglobin 10.4 (*)    HCT 32.1 (*)    Platelets 430 (*)    All other components within normal limits  COMPREHENSIVE METABOLIC PANEL - Abnormal; Notable for the following components:   Sodium 134 (*)    Potassium 3.2 (*)    Glucose, Bld 109 (*)    BUN 27 (*)    Creatinine, Ser 2.59 (*)    Calcium 8.7 (*)    Albumin 3.3 (*)    Alkaline Phosphatase 166 (*)    GFR, Estimated 25 (*)    All other components within normal limits  BLOOD GAS, ARTERIAL - Abnormal; Notable for the following components:   pH, Arterial 7.5 (*)    pO2, Arterial 111 (*)    Acid-Base Excess 2.7 (*)    All other components within normal limits  APTT - Abnormal; Notable for the following components:   aPTT 38 (*)    All other components within normal limits  URINALYSIS, ROUTINE W REFLEX MICROSCOPIC - Abnormal; Notable for the following components:   Protein, ur 30 (*)    All other components within normal limits  SURGICAL PCR SCREEN  SARS CORONAVIRUS 2 (TAT 6-24 HRS)  PROTIME-INR  TYPE AND SCREEN     IMAGES: CXR 09/16/22: In process.  CT Chest/abd/pelvis 08/30/22: IMPRESSION: 1. Cardiac enlargement with large pericardial effusion. 2. No signs of aortic aneurysm. 3. Small bilateral pleural effusions with compressive type atelectasis involving the lingula and left lower lobe. 4. Multiple pulmonary nodules. Most significant: 7 mm right ground-glass pulmonary nodule within the upper lobe. Per Fleischner Society Guidelines, recommend a non-contrast Chest CT at 3-6 months. Subsequent management based on the most suspicious nodule(s). These guidelines  do not apply to immunocompromised patients and patients with cancer. Follow up in patients with significant comorbidities as clinically warranted. For lung cancer screening, adhere to Lung-RADS guidelines. Reference: Radiology. 2017; 284(1):228-43. 5. Moderate retained stool identified throughout the colon. Correlate for any clinical signs or symptoms of constipation. 6. Prostate gland enlargement. 7. Right inguinal hernia containing fat and a small amount of fluid. 8.  Aortic Atherosclerosis (ICD10-I70.0).   US Renal 04/06/22: Summary:  - Largest Aortic Diameter: 2.4 cm  - Renal:  Right: Abnormal size for the right kidney. Normal right Resistive         Index. Normal cortical thickness of right kidney. No evidence         of right renal artery stenosis; no evidence of stenosis in         the accessory artery. RRV flow present.No evidence of         stenosis in the right accessory artery.  Left:  Abnormal size for the left kidney. Abnormal left Resistive         Index. Normal cortical thickness of the left kidney. No         evidence of left renal artery stenosis. LRV flow present.         Unable to duplicate elevated velocities of 170/26 cm/s from         prior exam in 02/2021.  - Mesenteric:  Normal Celiac artery findings. 70 to 99% stenosis in the superior  mesenteric  artery.  - Patent IVC.    EKG: 08/30/22: Sinus rhythm Borderline left axis deviation Borderline low voltage, extremity leads Confirmed by Godfrey Pick [phone]) on 08/30/2022 12:30:49 PM   CV: Echo 09/11/22: IMPRESSIONS   1. Left ventricular ejection fraction, by estimation, is 55 to 60%. The  left ventricle has normal function. The left ventricle has no regional  wall motion abnormalities. There is mild concentric left ventricular  hypertrophy. Left ventricular diastolic  parameters are indeterminate.   2. Right ventricular systolic function is normal. The right ventricular  size is normal. Tricuspid  regurgitation signal is inadequate for assessing  PA pressure.   3. Left atrial size was moderately dilated.   4. The mitral valve is normal in structure. No evidence of mitral valve  regurgitation. No evidence of mitral stenosis.   5. The aortic valve is tricuspid. There is mild calcification of the  aortic valve. Aortic valve regurgitation is not visualized. No aortic  stenosis is present.   6. The inferior vena cava is dilated in size with <50% respiratory  variability, suggesting right atrial pressure of 15 mmHg.   7. 28% respirophasic variation of mitral E inflow velocity (difficult  with irregular rhythm, possible atrial fibrillation). Slight RV diastolic  indentation that looks worse in subcostal views and slight right atrial  indentation. The IVC is dilated. I  suspect this is at least early tamponade physiology. Large pericardial  effusion. The pericardial effusion is circumferential.  8. The patient appears to be in atrial fibrillation.    US Carotid 03/07/21: Summary:  - Right Carotid: Velocities in the right ICA are consistent with a 1-39%  stenosis.  - Left Carotid: Velocities in the left ICA are consistent with a 1-39%  stenosis.     Past Medical History:  Diagnosis Date   Arthritis    Carotid artery occlusion    Chronic kidney disease    DVT (deep venous thrombosis) ([place]) 2008   GERD (gastroesophageal reflux disease)    takes Protonix daily   Gout    takes Allopurinol daily and Indomethacin prn   H/O renal artery stenosis    H/O: GI bleed    History of blood transfusion    History of colon polyps    History of gastric ulcer    Hyperlipidemia    takes Lipitor daily   Hypertension    takes Metoprolol and Amlodipine daily   Joint pain    Joint swelling    Sleep apnea     Past Surgical History:  Procedure Laterality Date   CAROTID ENDARTERECTOMY  2008   right CEA   CHOLECYSTECTOMY  mid 90's   COLON SURGERY     COLONOSCOPY     ENDARTERECTOMY   06/02/2012   Procedure: ENDARTERECTOMY CAROTID;  Surgeon: Serafina Mitchell, MD;  Location: [place];  Service: Vascular;  Laterality: Left;   ESOPHAGOGASTRODUODENOSCOPY     HERNIA REPAIR     umbilical hernia   PATCH ANGIOPLASTY  06/02/2012   Procedure: PATCH ANGIOPLASTY;  Surgeon: Serafina Mitchell, MD;  Location: [place];  Service: Vascular;  Laterality: Left;  using Vascu-Guard Patch   PERIPHERAL VASCULAR CATHETERIZATION Right 12/03/2015   Procedure: Peripheral Vascular Balloon Angioplasty;  Surgeon: Serafina Mitchell, MD;  Location: [place] [facility];  Service: Cardiovascular;  Laterality: Right;  RENAL unsuccessful   RENAL ARTERY STENT  2009   Right renal artery stenting-  Right Kidney   small intestine removed  2001   d/t random gi bleeding    MEDICATIONS:  allopurinol (ZYLOPRIM) 100 MG tablet   amLODipine-atorvastatin (CADUET) 10-40 MG tablet   aspirin 81 MG tablet   Cholecalciferol (VITAMIN D3) 50 MCG (2000 UT) TABS   cloNIDine (CATAPRES) 0.3 MG tablet   donepezil (ARICEPT) 5 MG tablet   doxazosin (CARDURA) 2 MG tablet   ferrous sulfate 325 (65 FE) MG tablet   furosemide (LASIX) 80 MG tablet   potassium chloride (MICRO-K) 10 MEQ CR capsule   sertraline (ZOLOFT) 50 MG tablet   No current facility-administered medications for this encounter.    Myra Gianotti, PA-C Surgical Short Stay/Anesthesiology [facility] Phone [phone] [facility] Phone [phone] 09/17/2022 2:17 PM

## 2022-09-23 ENCOUNTER — Encounter (HOSPITAL_COMMUNITY)
Admission: RE | Disposition: A | Discharge status: Home / Self Care | Payer: Self-pay | Source: Ambulatory Visit | Attending: Thoracic Surgery (Cardiothoracic Vascular Surgery)

## 2022-09-23 ENCOUNTER — Inpatient Hospital Stay (HOSPITAL_COMMUNITY): Payer: Medicare Other

## 2022-09-23 ENCOUNTER — Other Ambulatory Visit: Payer: Self-pay

## 2022-09-23 ENCOUNTER — Inpatient Hospital Stay (HOSPITAL_COMMUNITY): Payer: Medicare Other | Admitting: Vascular Surgery

## 2022-09-23 ENCOUNTER — Inpatient Hospital Stay (HOSPITAL_COMMUNITY)
Admission: RE | Admit: 2022-09-23 | Discharge: 2022-09-28 | DRG: 271 | Disposition: A | Discharge status: Home / Self Care | Payer: Medicare Other | Source: Ambulatory Visit | Attending: Thoracic Surgery (Cardiothoracic Vascular Surgery) | Admitting: Thoracic Surgery (Cardiothoracic Vascular Surgery)

## 2022-09-23 ENCOUNTER — Encounter (HOSPITAL_COMMUNITY): Payer: Self-pay | Admitting: Thoracic Surgery (Cardiothoracic Vascular Surgery)

## 2022-09-23 ENCOUNTER — Inpatient Hospital Stay (HOSPITAL_COMMUNITY): Payer: Medicare Other | Admitting: Certified Registered"

## 2022-09-23 DIAGNOSIS — Z9889 Other specified postprocedural states: Principal | ICD-10-CM | POA: Diagnosis present

## 2022-09-23 DIAGNOSIS — Z87891 Personal history of nicotine dependence: Secondary | ICD-10-CM

## 2022-09-23 DIAGNOSIS — Z79899 Other long term (current) drug therapy: Secondary | ICD-10-CM

## 2022-09-23 DIAGNOSIS — E1122 Type 2 diabetes mellitus with diabetic chronic kidney disease: Secondary | ICD-10-CM | POA: Diagnosis present

## 2022-09-23 DIAGNOSIS — E871 Hypo-osmolality and hyponatremia: Secondary | ICD-10-CM | POA: Diagnosis not present

## 2022-09-23 DIAGNOSIS — Z8349 Family history of other endocrine, nutritional and metabolic diseases: Secondary | ICD-10-CM

## 2022-09-23 DIAGNOSIS — M1990 Unspecified osteoarthritis, unspecified site: Secondary | ICD-10-CM | POA: Diagnosis present

## 2022-09-23 DIAGNOSIS — I10 Essential (primary) hypertension: Secondary | ICD-10-CM

## 2022-09-23 DIAGNOSIS — I319 Disease of pericardium, unspecified: Secondary | ICD-10-CM | POA: Diagnosis present

## 2022-09-23 DIAGNOSIS — I2510 Atherosclerotic heart disease of native coronary artery without angina pectoris: Secondary | ICD-10-CM | POA: Diagnosis present

## 2022-09-23 DIAGNOSIS — E1151 Type 2 diabetes mellitus with diabetic peripheral angiopathy without gangrene: Secondary | ICD-10-CM | POA: Diagnosis present

## 2022-09-23 DIAGNOSIS — Z8249 Family history of ischemic heart disease and other diseases of the circulatory system: Secondary | ICD-10-CM

## 2022-09-23 DIAGNOSIS — Z01818 Encounter for other preprocedural examination: Secondary | ICD-10-CM | POA: Diagnosis not present

## 2022-09-23 DIAGNOSIS — Z7901 Long term (current) use of anticoagulants: Secondary | ICD-10-CM

## 2022-09-23 DIAGNOSIS — I3139 Other pericardial effusion (noninflammatory): Secondary | ICD-10-CM | POA: Diagnosis present

## 2022-09-23 DIAGNOSIS — N184 Chronic kidney disease, stage 4 (severe): Secondary | ICD-10-CM | POA: Diagnosis present

## 2022-09-23 DIAGNOSIS — I6523 Occlusion and stenosis of bilateral carotid arteries: Secondary | ICD-10-CM | POA: Diagnosis present

## 2022-09-23 DIAGNOSIS — Z9049 Acquired absence of other specified parts of digestive tract: Secondary | ICD-10-CM | POA: Diagnosis not present

## 2022-09-23 DIAGNOSIS — E876 Hypokalemia: Secondary | ICD-10-CM | POA: Diagnosis not present

## 2022-09-23 DIAGNOSIS — I130 Hypertensive heart and chronic kidney disease with heart failure and stage 1 through stage 4 chronic kidney disease, or unspecified chronic kidney disease: Secondary | ICD-10-CM | POA: Diagnosis present

## 2022-09-23 DIAGNOSIS — G4733 Obstructive sleep apnea (adult) (pediatric): Secondary | ICD-10-CM | POA: Diagnosis present

## 2022-09-23 DIAGNOSIS — E785 Hyperlipidemia, unspecified: Secondary | ICD-10-CM | POA: Diagnosis present

## 2022-09-23 DIAGNOSIS — I4891 Unspecified atrial fibrillation: Secondary | ICD-10-CM | POA: Diagnosis not present

## 2022-09-23 DIAGNOSIS — K219 Gastro-esophageal reflux disease without esophagitis: Secondary | ICD-10-CM | POA: Diagnosis present

## 2022-09-23 DIAGNOSIS — M109 Gout, unspecified: Secondary | ICD-10-CM | POA: Diagnosis present

## 2022-09-23 DIAGNOSIS — Z1152 Encounter for screening for COVID-19: Secondary | ICD-10-CM | POA: Diagnosis not present

## 2022-09-23 DIAGNOSIS — D62 Acute posthemorrhagic anemia: Secondary | ICD-10-CM | POA: Diagnosis not present

## 2022-09-23 DIAGNOSIS — I480 Paroxysmal atrial fibrillation: Secondary | ICD-10-CM | POA: Diagnosis not present

## 2022-09-23 DIAGNOSIS — R918 Other nonspecific abnormal finding of lung field: Secondary | ICD-10-CM | POA: Diagnosis not present

## 2022-09-23 DIAGNOSIS — J90 Pleural effusion, not elsewhere classified: Secondary | ICD-10-CM | POA: Diagnosis not present

## 2022-09-23 DIAGNOSIS — D6869 Other thrombophilia: Secondary | ICD-10-CM | POA: Diagnosis not present

## 2022-09-23 DIAGNOSIS — Z86718 Personal history of other venous thrombosis and embolism: Secondary | ICD-10-CM

## 2022-09-23 DIAGNOSIS — Z833 Family history of diabetes mellitus: Secondary | ICD-10-CM | POA: Diagnosis not present

## 2022-09-23 DIAGNOSIS — Z888 Allergy status to other drugs, medicaments and biological substances status: Secondary | ICD-10-CM | POA: Diagnosis not present

## 2022-09-23 DIAGNOSIS — Z832 Family history of diseases of the blood and blood-forming organs and certain disorders involving the immune mechanism: Secondary | ICD-10-CM

## 2022-09-23 DIAGNOSIS — I739 Peripheral vascular disease, unspecified: Secondary | ICD-10-CM

## 2022-09-23 DIAGNOSIS — J9811 Atelectasis: Secondary | ICD-10-CM | POA: Diagnosis not present

## 2022-09-23 HISTORY — PX: TEE WITHOUT CARDIOVERSION: SHX5443

## 2022-09-23 HISTORY — PX: SUBXYPHOID PERICARDIAL WINDOW: SHX5075

## 2022-09-23 LAB — CBC
HCT: 31.6 % — ABNORMAL LOW (ref 39.0–52.0)
Hemoglobin: 10.6 g/dL — ABNORMAL LOW (ref 13.0–17.0)
MCH: 27.8 pg (ref 26.0–34.0)
MCHC: 33.5 g/dL (ref 30.0–36.0)
MCV: 82.9 fL (ref 80.0–100.0)
Platelets: 285 10*3/uL (ref 150–400)
RBC: 3.81 MIL/uL — ABNORMAL LOW (ref 4.22–5.81)
RDW: 14.8 % (ref 11.5–15.5)
WBC: 6 10*3/uL (ref 4.0–10.5)
nRBC: 0 % (ref 0.0–0.2)

## 2022-09-23 LAB — BASIC METABOLIC PANEL
Anion gap: 12 (ref 5–15)
BUN: 22 mg/dL (ref 8–23)
CO2: 27 mmol/L (ref 22–32)
Calcium: 8.9 mg/dL (ref 8.9–10.3)
Chloride: 97 mmol/L — ABNORMAL LOW (ref 98–111)
Creatinine, Ser: 2.69 mg/dL — ABNORMAL HIGH (ref 0.61–1.24)
GFR, Estimated: 24 mL/min — ABNORMAL LOW (ref >60)
Glucose, Bld: 123 mg/dL — ABNORMAL HIGH (ref 70–99)
Potassium: 3.5 mmol/L (ref 3.5–5.1)
Sodium: 136 mmol/L (ref 135–145)

## 2022-09-23 LAB — BODY FLUID CELL COUNT WITH DIFFERENTIAL
Lymphs, Fluid: 52 %
Other Cells, Fluid: 48 %
Total Nucleated Cell Count, Fluid: 6350 cu mm — ABNORMAL HIGH (ref 0–1000)

## 2022-09-23 LAB — SARS CORONAVIRUS 2 BY RT PCR: SARS Coronavirus 2 by RT PCR: NEGATIVE

## 2022-09-23 LAB — ABO/RH: ABO/RH(D): O NEG

## 2022-09-23 SURGERY — CREATION, PERICARDIAL WINDOW, SUBXIPHOID APPROACH
Anesthesia: General | Site: Chest

## 2022-09-23 MED ORDER — FENTANYL CITRATE (PF) 100 MCG/2ML IJ SOLN
25.0000 ug | INTRAMUSCULAR | Status: DC | PRN
  Administered 2022-09-23 (×4): 25 ug via INTRAVENOUS

## 2022-09-23 MED ORDER — FENTANYL CITRATE (PF) 100 MCG/2ML IJ SOLN
INTRAMUSCULAR | Status: AC
  Filled 2022-09-23: qty 2

## 2022-09-23 MED ORDER — FENTANYL CITRATE (PF) 100 MCG/2ML IJ SOLN
INTRAMUSCULAR | Status: DC | PRN
  Administered 2022-09-23 (×2): 50 ug via INTRAVENOUS

## 2022-09-23 MED ORDER — POTASSIUM CHLORIDE CRYS ER 20 MEQ PO TBCR
20.0000 meq | EXTENDED_RELEASE_TABLET | Freq: Once | ORAL | Status: AC
  Administered 2022-09-23: 20 meq via ORAL
  Filled 2022-09-23: qty 1

## 2022-09-23 MED ORDER — LACTATED RINGERS IV SOLN: INTRAVENOUS | Status: DC

## 2022-09-23 MED ORDER — SUCCINYLCHOLINE CHLORIDE 200 MG/10ML IV SOSY
PREFILLED_SYRINGE | INTRAVENOUS | Status: DC | PRN
  Administered 2022-09-23: 120 mg via INTRAVENOUS

## 2022-09-23 MED ORDER — SUGAMMADEX SODIUM 200 MG/2ML IV SOLN
INTRAVENOUS | Status: DC | PRN
  Administered 2022-09-23: 200 mg via INTRAVENOUS

## 2022-09-23 MED ORDER — SODIUM CHLORIDE 0.9% FLUSH
10.0000 mL | Freq: Two times a day (BID) | INTRAVENOUS | Status: DC
  Administered 2022-09-23 – 2022-09-27 (×8): 10 mL

## 2022-09-23 MED ORDER — FENTANYL CITRATE (PF) 250 MCG/5ML IJ SOLN
INTRAMUSCULAR | Status: AC
  Filled 2022-09-23: qty 5

## 2022-09-23 MED ORDER — OXYCODONE HCL 5 MG/5ML PO SOLN: 5.0000 mg | Freq: Once | ORAL | Status: DC | PRN

## 2022-09-23 MED ORDER — FUROSEMIDE 40 MG PO TABS
80.0000 mg | ORAL_TABLET | Freq: Two times a day (BID) | ORAL | Status: DC
  Administered 2022-09-23 – 2022-09-27 (×8): 80 mg via ORAL
  Filled 2022-09-23 (×8): qty 2

## 2022-09-23 MED ORDER — DEXAMETHASONE SODIUM PHOSPHATE 10 MG/ML IJ SOLN
INTRAMUSCULAR | Status: DC | PRN
  Administered 2022-09-23: 5 mg via INTRAVENOUS

## 2022-09-23 MED ORDER — BISACODYL 5 MG PO TBEC
10.0000 mg | DELAYED_RELEASE_TABLET | Freq: Every day | ORAL | Status: DC
  Administered 2022-09-25 – 2022-09-27 (×2): 10 mg via ORAL
  Filled 2022-09-23 (×5): qty 2

## 2022-09-23 MED ORDER — DEXTROSE-NACL 5-0.45 % IV SOLN: INTRAVENOUS | Status: DC

## 2022-09-23 MED ORDER — ROCURONIUM BROMIDE 10 MG/ML (PF) SYRINGE
PREFILLED_SYRINGE | INTRAVENOUS | Status: DC | PRN
  Administered 2022-09-23: 40 mg via INTRAVENOUS

## 2022-09-23 MED ORDER — ONDANSETRON HCL 4 MG/2ML IJ SOLN
INTRAMUSCULAR | Status: DC | PRN
  Administered 2022-09-23: 4 mg via INTRAVENOUS

## 2022-09-23 MED ORDER — DEXAMETHASONE SODIUM PHOSPHATE 10 MG/ML IJ SOLN
INTRAMUSCULAR | Status: AC
  Filled 2022-09-23: qty 1

## 2022-09-23 MED ORDER — ACETAMINOPHEN 500 MG PO TABS
1000.0000 mg | ORAL_TABLET | Freq: Four times a day (QID) | ORAL | Status: DC
  Administered 2022-09-23 – 2022-09-27 (×12): 1000 mg via ORAL
  Filled 2022-09-23 (×13): qty 2

## 2022-09-23 MED ORDER — SUCCINYLCHOLINE CHLORIDE 200 MG/10ML IV SOSY
PREFILLED_SYRINGE | INTRAVENOUS | Status: AC
  Filled 2022-09-23: qty 10

## 2022-09-23 MED ORDER — OXYCODONE HCL 5 MG PO TABS: 5.0000 mg | ORAL_TABLET | ORAL | Status: DC | PRN

## 2022-09-23 MED ORDER — AMLODIPINE BESYLATE 10 MG PO TABS: 10.0000 mg | ORAL_TABLET | Freq: Every day | ORAL | Status: DC

## 2022-09-23 MED ORDER — ORAL CARE MOUTH RINSE: 15.0000 mL | OROMUCOSAL | Status: DC | PRN

## 2022-09-23 MED ORDER — ATORVASTATIN CALCIUM 40 MG PO TABS
40.0000 mg | ORAL_TABLET | Freq: Every day | ORAL | Status: DC
  Administered 2022-09-23 – 2022-09-27 (×5): 40 mg via ORAL
  Filled 2022-09-23 (×5): qty 1

## 2022-09-23 MED ORDER — CHLORHEXIDINE GLUCONATE CLOTH 2 % EX PADS
6.0000 | MEDICATED_PAD | Freq: Every day | CUTANEOUS | Status: DC
  Administered 2022-09-23 – 2022-09-27 (×5): 6 via TOPICAL

## 2022-09-23 MED ORDER — PROPOFOL 10 MG/ML IV BOLUS
INTRAVENOUS | Status: DC | PRN
  Administered 2022-09-23: 20 mg via INTRAVENOUS
  Administered 2022-09-23: 70 mg via INTRAVENOUS

## 2022-09-23 MED ORDER — SODIUM CHLORIDE 0.9 % IV SOLN: INTRAVENOUS | Status: DC | PRN

## 2022-09-23 MED ORDER — CEFAZOLIN SODIUM-DEXTROSE 2-4 GM/100ML-% IV SOLN
2.0000 g | INTRAVENOUS | Status: AC
  Administered 2022-09-23: 2 g via INTRAVENOUS
  Filled 2022-09-23: qty 100

## 2022-09-23 MED ORDER — ROCURONIUM BROMIDE 10 MG/ML (PF) SYRINGE
PREFILLED_SYRINGE | INTRAVENOUS | Status: AC
  Filled 2022-09-23: qty 10

## 2022-09-23 MED ORDER — ACETAMINOPHEN 160 MG/5ML PO SOLN: 1000.0000 mg | Freq: Four times a day (QID) | ORAL | Status: DC

## 2022-09-23 MED ORDER — POTASSIUM CHLORIDE CRYS ER 10 MEQ PO TBCR: 10.0000 meq | EXTENDED_RELEASE_TABLET | Freq: Every day | ORAL | Status: DC

## 2022-09-23 MED ORDER — DOXAZOSIN MESYLATE 4 MG PO TABS
4.0000 mg | ORAL_TABLET | Freq: Two times a day (BID) | ORAL | Status: DC
  Administered 2022-09-23 – 2022-09-28 (×10): 4 mg via ORAL
  Filled 2022-09-23 (×11): qty 1

## 2022-09-23 MED ORDER — ONDANSETRON HCL 4 MG/2ML IJ SOLN: 4.0000 mg | Freq: Four times a day (QID) | INTRAMUSCULAR | Status: DC | PRN

## 2022-09-23 MED ORDER — LIDOCAINE 2% (20 MG/ML) 5 ML SYRINGE
INTRAMUSCULAR | Status: DC | PRN
  Administered 2022-09-23: 40 mg via INTRAVENOUS

## 2022-09-23 MED ORDER — TRAMADOL HCL 50 MG PO TABS: 50.0000 mg | ORAL_TABLET | Freq: Four times a day (QID) | ORAL | Status: DC | PRN

## 2022-09-23 MED ORDER — FERROUS SULFATE 325 (65 FE) MG PO TABS
325.0000 mg | ORAL_TABLET | Freq: Two times a day (BID) | ORAL | Status: DC
  Administered 2022-09-23 – 2022-09-28 (×10): 325 mg via ORAL
  Filled 2022-09-23 (×10): qty 1

## 2022-09-23 MED ORDER — CEFAZOLIN SODIUM-DEXTROSE 2-4 GM/100ML-% IV SOLN
2.0000 g | Freq: Three times a day (TID) | INTRAVENOUS | Status: AC
  Administered 2022-09-23 (×2): 2 g via INTRAVENOUS
  Filled 2022-09-23 (×2): qty 100

## 2022-09-23 MED ORDER — ACETAMINOPHEN 10 MG/ML IV SOLN: 1000.0000 mg | Freq: Once | INTRAVENOUS | Status: DC | PRN

## 2022-09-23 MED ORDER — CHLORHEXIDINE GLUCONATE 0.12 % MT SOLN
15.0000 mL | Freq: Once | OROMUCOSAL | Status: AC
  Administered 2022-09-23: 15 mL via OROMUCOSAL
  Filled 2022-09-23: qty 15

## 2022-09-23 MED ORDER — ALLOPURINOL 100 MG PO TABS
100.0000 mg | ORAL_TABLET | Freq: Two times a day (BID) | ORAL | Status: DC
  Administered 2022-09-24 – 2022-09-28 (×9): 100 mg via ORAL
  Filled 2022-09-23 (×9): qty 1

## 2022-09-23 MED ORDER — ACETAMINOPHEN 325 MG PO TABS: 325.0000 mg | ORAL_TABLET | ORAL | Status: DC | PRN

## 2022-09-23 MED ORDER — OXYCODONE HCL 5 MG PO TABS: 5.0000 mg | ORAL_TABLET | Freq: Once | ORAL | Status: DC | PRN

## 2022-09-23 MED ORDER — AMLODIPINE-ATORVASTATIN 10-40 MG PO TABS: 1.0000 | ORAL_TABLET | Freq: Every evening | ORAL | Status: DC

## 2022-09-23 MED ORDER — CLONIDINE HCL 0.1 MG PO TABS
0.3000 mg | ORAL_TABLET | Freq: Three times a day (TID) | ORAL | Status: DC
  Administered 2022-09-23 – 2022-09-28 (×15): 0.3 mg via ORAL
  Filled 2022-09-23 (×15): qty 3

## 2022-09-23 MED ORDER — SENNOSIDES-DOCUSATE SODIUM 8.6-50 MG PO TABS
1.0000 | ORAL_TABLET | Freq: Every day | ORAL | Status: DC
  Administered 2022-09-23 – 2022-09-27 (×5): 1 via ORAL
  Filled 2022-09-23 (×5): qty 1

## 2022-09-23 MED ORDER — MIDAZOLAM HCL 2 MG/2ML IJ SOLN
INTRAMUSCULAR | Status: DC | PRN
  Administered 2022-09-23: 1 mg via INTRAVENOUS

## 2022-09-23 MED ORDER — METOPROLOL TARTRATE 25 MG PO TABS
25.0000 mg | ORAL_TABLET | Freq: Two times a day (BID) | ORAL | Status: DC
  Administered 2022-09-23: 25 mg via ORAL
  Filled 2022-09-23: qty 1

## 2022-09-23 MED ORDER — LIDOCAINE 2% (20 MG/ML) 5 ML SYRINGE
INTRAMUSCULAR | Status: AC
  Filled 2022-09-23: qty 5

## 2022-09-23 MED ORDER — DONEPEZIL HCL 10 MG PO TABS
5.0000 mg | ORAL_TABLET | Freq: Every day | ORAL | Status: DC
  Administered 2022-09-23 – 2022-09-27 (×5): 5 mg via ORAL
  Filled 2022-09-23 (×5): qty 1

## 2022-09-23 MED ORDER — MORPHINE SULFATE (PF) 2 MG/ML IV SOLN: 2.0000 mg | INTRAVENOUS | Status: DC | PRN

## 2022-09-23 MED ORDER — PROMETHAZINE HCL 25 MG/ML IJ SOLN: 6.2500 mg | INTRAMUSCULAR | Status: DC | PRN

## 2022-09-23 MED ORDER — MIDAZOLAM HCL 2 MG/2ML IJ SOLN
INTRAMUSCULAR | Status: AC
  Filled 2022-09-23: qty 2

## 2022-09-23 MED ORDER — CLONIDINE HCL 0.1 MG PO TABS: 0.3000 mg | ORAL_TABLET | Freq: Three times a day (TID) | ORAL | Status: DC

## 2022-09-23 MED ORDER — SODIUM CHLORIDE 0.9% FLUSH: 10.0000 mL | INTRAVENOUS | Status: DC | PRN

## 2022-09-23 MED ORDER — PANTOPRAZOLE SODIUM 40 MG PO TBEC
40.0000 mg | DELAYED_RELEASE_TABLET | Freq: Every day | ORAL | Status: DC
  Administered 2022-09-24 – 2022-09-28 (×5): 40 mg via ORAL
  Filled 2022-09-23 (×5): qty 1

## 2022-09-23 MED ORDER — ASPIRIN 81 MG PO TBEC
81.0000 mg | DELAYED_RELEASE_TABLET | Freq: Every day | ORAL | Status: DC
  Administered 2022-09-24 – 2022-09-28 (×5): 81 mg via ORAL
  Filled 2022-09-23 (×5): qty 1

## 2022-09-23 MED ORDER — ACETAMINOPHEN 160 MG/5ML PO SOLN: 325.0000 mg | ORAL | Status: DC | PRN

## 2022-09-23 MED ORDER — 0.9 % SODIUM CHLORIDE (POUR BTL) OPTIME
TOPICAL | Status: DC | PRN
  Administered 2022-09-23: 2000 mL

## 2022-09-23 MED ORDER — PROPOFOL 10 MG/ML IV BOLUS
INTRAVENOUS | Status: AC
  Filled 2022-09-23: qty 20

## 2022-09-23 MED ORDER — ORAL CARE MOUTH RINSE: 15.0000 mL | Freq: Once | OROMUCOSAL | Status: AC

## 2022-09-23 MED ORDER — ASPIRIN 81 MG PO TABS: 81.0000 mg | ORAL_TABLET | Freq: Every day | ORAL | Status: DC

## 2022-09-23 MED ORDER — ONDANSETRON HCL 4 MG/2ML IJ SOLN
INTRAMUSCULAR | Status: AC
  Filled 2022-09-23: qty 2

## 2022-09-23 SURGICAL SUPPLY — 55 items
ADH SKN CLS LQ APL DERMABOND (GAUZE/BANDAGES/DRESSINGS) ×2
BLADE CLIPPER SURG (BLADE) ×2 IMPLANT
BLANKET WARM LOWER BOD BAIR (MISCELLANEOUS) IMPLANT
CANISTER SUCT 3000ML PPV (MISCELLANEOUS) ×2 IMPLANT
CATH THORACIC 28FR (CATHETERS) IMPLANT
CATH THORACIC 28FR RT ANG (CATHETERS) IMPLANT
CATH THORACIC 36FR (CATHETERS) IMPLANT
CATH THORACIC 36FR RT ANG (CATHETERS) IMPLANT
CLEANER TIP ELECTROSURG 2X2 (MISCELLANEOUS) IMPLANT
CLIP TI MEDIUM 6 (CLIP) IMPLANT
CLIP TI WIDE RED SMALL 6 (CLIP) IMPLANT
CNTNR URN SCR LID CUP LEK RST (MISCELLANEOUS) ×4 IMPLANT
CONT SPEC 4OZ STRL OR WHT (MISCELLANEOUS) ×4
DERMABOND ADVANCED .7 DNX6 (GAUZE/BANDAGES/DRESSINGS) IMPLANT
DRAIN CHANNEL 19F RND (DRAIN) IMPLANT
DRAIN CHANNEL 28F RND 3/8 FF (WOUND CARE) IMPLANT
DRAIN CHANNEL 32F RND 10.7 FF (WOUND CARE) IMPLANT
DRAPE LAPAROSCOPIC ABDOMINAL (DRAPES) ×2 IMPLANT
DRAPE WARM FLUID 44X44 (DRAPES) IMPLANT
ELECT REM PT RETURN 9FT ADLT (ELECTROSURGICAL) ×2
ELECTRODE REM PT RTRN 9FT ADLT (ELECTROSURGICAL) ×2 IMPLANT
GAUZE 4X4 16PLY ~~LOC~~+RFID DBL (SPONGE) ×2 IMPLANT
GAUZE SPONGE 4X4 12PLY STRL (GAUZE/BANDAGES/DRESSINGS) ×4 IMPLANT
GLOVE SS BIOGEL STRL SZ 6.5 (GLOVE) IMPLANT
GLOVE SS BIOGEL STRL SZ 7.5 (GLOVE) ×2 IMPLANT
GLOVE SURG SIGNA 7.5 PF LTX (GLOVE) ×4 IMPLANT
GOWN STRL REUS W/ TWL LRG LVL3 (GOWN DISPOSABLE) ×2 IMPLANT
GOWN STRL REUS W/ TWL XL LVL3 (GOWN DISPOSABLE) ×2 IMPLANT
GOWN STRL REUS W/TWL LRG LVL3 (GOWN DISPOSABLE) ×8
GOWN STRL REUS W/TWL XL LVL3 (GOWN DISPOSABLE) ×4
KIT BASIN OR (CUSTOM PROCEDURE TRAY) ×2 IMPLANT
KIT SUCTION CATH 14FR (SUCTIONS) IMPLANT
KIT TURNOVER KIT B (KITS) ×2 IMPLANT
NS IRRIG 1000ML POUR BTL (IV SOLUTION) ×4 IMPLANT
PACK CHEST (CUSTOM PROCEDURE TRAY) ×2 IMPLANT
PAD ARMBOARD 7.5X6 YLW CONV (MISCELLANEOUS) ×4 IMPLANT
PAD ELECT DEFIB RADIOL ZOLL (MISCELLANEOUS) ×2 IMPLANT
SPONGE T-LAP 18X18 ~~LOC~~+RFID (SPONGE) ×8 IMPLANT
SPONGE T-LAP 4X18 ~~LOC~~+RFID (SPONGE) ×2 IMPLANT
STAPLER VISISTAT 35W (STAPLE) IMPLANT
SUT SILK  1 MH (SUTURE) ×2
SUT SILK 1 MH (SUTURE) ×2 IMPLANT
SUT VIC AB 1 CTX 36 (SUTURE) ×2
SUT VIC AB 1 CTX36XBRD ANBCTR (SUTURE) ×2 IMPLANT
SUT VIC AB 2-0 CTX 36 (SUTURE) ×2 IMPLANT
SUT VIC AB 3-0 X1 27 (SUTURE) ×2 IMPLANT
SWAB COLLECTION DEVICE MRSA (MISCELLANEOUS) IMPLANT
SWAB CULTURE ESWAB REG 1ML (MISCELLANEOUS) IMPLANT
SYSTEM SAHARA CHEST DRAIN ATS (WOUND CARE) ×2 IMPLANT
TAPE CLOTH SURG 4X10 WHT LF (GAUZE/BANDAGES/DRESSINGS) IMPLANT
TOWEL GREEN STERILE (TOWEL DISPOSABLE) ×2 IMPLANT
TOWEL GREEN STERILE FF (TOWEL DISPOSABLE) ×2 IMPLANT
TRAP SPECIMEN MUCUS 40CC (MISCELLANEOUS) ×4 IMPLANT
TRAY FOLEY SLVR 16FR TEMP STAT (SET/KITS/TRAYS/PACK) ×2 IMPLANT
WATER STERILE IRR 1000ML POUR (IV SOLUTION) ×4 IMPLANT

## 2022-09-23 NOTE — Anesthesia Procedure Notes (Signed)
Central Venous Catheter Insertion Performed by: Effie Berkshire, MD, anesthesiologist Start/End2/28/2024 7:05 AM, 09/23/2022 7:15 AM Patient location: Pre-op. Preanesthetic checklist: patient identified, IV checked, site marked, risks and benefits discussed, surgical consent, monitors and equipment checked, pre-op evaluation, timeout performed and anesthesia consent Position: Trendelenburg Lidocaine 1% used for infiltration and patient sedated Hand hygiene performed , maximum sterile barriers used  and Seldinger technique used Catheter size: 8 Fr Total catheter length 16. Central line was placed.Double lumen Procedure performed using ultrasound guided technique. Ultrasound Notes:anatomy identified, needle tip was noted to be adjacent to the nerve/plexus identified, no ultrasound evidence of intravascular and/or intraneural injection and image(s) printed for medical record Attempts: 1 Following insertion, dressing applied, line sutured and Biopatch. Post procedure assessment: blood return through all ports  Patient tolerated the procedure well with no immediate complications.

## 2022-09-23 NOTE — Transfer of Care (Signed)
Immediate Anesthesia Transfer of Care Note  Patient: James [name]  Procedure(s) Performed: SUBXYPHOID PERICARDIAL WINDOW (Chest) TRANSESOPHAGEAL ECHOCARDIOGRAM  Patient Location: PACU  Anesthesia Type:General  Level of Consciousness: awake and oriented  Airway & Oxygen Therapy: Patient Spontanous Breathing  Post-op Assessment: Report given to RN  Post vital signs: Reviewed and stable  Last Vitals:  Vitals Value Taken Time  BP 163/66   Temp    Pulse 73   Resp 17   SpO2 92     Last Pain:  Vitals:   09/23/22 0611  TempSrc:   PainSc: 0-No pain         Complications:  Encounter Notable Events  Notable Event Outcome Phase Comment  Difficult to intubate - expected  Intraprocedure Filed from anesthesia note documentation.

## 2022-09-23 NOTE — Progress Notes (Signed)
     [address]       [place] [address]             [phone]       EVENING ROUNDS  POD 0 from pericardial drainage Hemodynamics goog 100 cc out since surgery

## 2022-09-23 NOTE — Discharge Summary (Signed)
[address]       [place] [address]             [phone]    Physician Discharge Summary  Patient ID: James [name] MRN: [id number] DOB/AGE: [birth year] 77 y.o.  Admit date: 09/23/2022 Discharge date: 09/28/2022  Admission Diagnoses:  Patient Active Problem List   Diagnosis Date Noted   Chest pain 08/30/2022   Pulmonary nodules 08/30/2022   CKD (chronic kidney disease) stage 4, GFR 15-29 ml/min (HCC) 08/30/2022   Hypokalemia 08/30/2022   Bilateral carotid artery stenosis 11/17/2021   Fatigue 01/22/2021   Pericardial effusion 10/13/2020   Chronic kidney disease (CKD), stage III (moderate) (HCC) 10/21/2017   OSA on CPAP 03/09/2016   Hypersomnia 01/07/2016   Exertional dyspnea 01/07/2016   Back pain 08/01/2015   Gastroesophageal reflux disease with esophagitis 07/17/2015   Accelerated secondary hypertension 07/17/2015   S/P arterial stent 04/29/2015   Renal artery stenosis ([place]) 12/26/2012   Hypertension 10/11/2012   Hyperlipidemia LDL goal <100 10/11/2012   Gout 10/11/2012   Occlusion and stenosis of carotid artery without mention of cerebral infarction 10/12/2011   Discharge Diagnoses:  Patient Active Problem List   Diagnosis Date Noted   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   Chest pain 08/30/2022   Pulmonary nodules 08/30/2022   CKD (chronic kidney disease) stage 4, GFR 15-29 ml/min (HCC) 08/30/2022   Hypokalemia 08/30/2022   Bilateral carotid artery stenosis 11/17/2021   Fatigue 01/22/2021   Pericardial effusion 10/13/2020   Chronic kidney disease (CKD), stage III (moderate) ([place]) 10/21/2017   OSA on CPAP 03/09/2016   Hypersomnia 01/07/2016   Exertional dyspnea 01/07/2016   Back pain 08/01/2015   Gastroesophageal reflux disease with esophagitis 07/17/2015   Accelerated secondary hypertension 07/17/2015   S/P arterial stent 04/29/2015   Renal artery stenosis ([place]) 12/26/2012   Hypertension 10/11/2012   Hyperlipidemia LDL  goal <100 10/11/2012   Gout 10/11/2012   Occlusion and stenosis of carotid artery without mention of cerebral infarction 10/12/2011   Discharged Condition: stable  History of Present Illness:  HPI: James [name] is sent for consultation regarding a pericardial effusion.   James [name] is a 77 year old man with numerous medical problems including a pericardial effusion, severe thoracic and abdominal aortic atherosclerosis, renal artery stenosis, bilateral carotid endarterectomy, DVT, hypertension, hyperlipidemia, ulcers, reflux, arthritis, and gout.   He has had a longstanding pericardial effusion.  He has chronic peripheral edema.  Recently was in the emergency room on 08/30/2021 with gastrointestinal complaints.  As part of his workup he had a CT of the chest abdomen and pelvis.  It showed a large pericardial effusion.  He left AMA at that time.  In the meantime he has become progressively more short of breath.  Now even with minimal activities.  Also has had worsening of his peripheral edema.  Repeat echocardiogram showed an increase in size of the pericardial effusion with some early signs of tamponade.  He was evaluated by Dr. Roxan Hockey who recommended sub-xiphoid pericardial window creation.  The risks and benefits of the procedure were explained to the patient and he was agreeable to proceed.  Hospital Course:  James [name] presented to [facility] on 09/23/2022.  He was taken to the operating room and underwent Sub-xiphoid pericardial window with removal of 1000 ml of bloody serous drainage.  He tolerated the procedure without difficulty and was taken to the PACU in stable condition. He went into a fib with a  controlled ventricular rate, bradycardic at times. Cardiology was consulted. Lopressor was decreased to 12.5 mg bid. He will be referred to the a fib clinic at discharge. Because the pericardial drainage was hemorrhagic, it is recommended that anticoagulation not be  started for 4-5 days from the day of surgery. He was hypertensive on 03/01 so Amlodipine was restarted (he took Caduet prior to surgery). He has a history of CKD (stage IV) and was on Lasix 80 mg po bid prior to surgery. Creatinine was monitored and remained stable. He did have anemia (has a history) and he was restarted on oral Ferrous sulfate bid. He has a history of OSA and used CPAP at night. He was surgically stable for transfer from the ICU to [facility] on 02/29 and a bed was available on 03/01. Pericardial drains output gradually decreased and daily chest x rays remained stable. All drains were removed by 03/03.  The patient will be treated with a 30 day course of steroids.  He has been resumed on home Eliquis.  He is ambulating without difficulty. His surgical incisions are free from signs of infection.  He is medically stable for discharge home today.  Consults: None  Significant Diagnostic Studies: cardiac graphics: Echocardiogram:    IMPRESSIONS    1. Left ventricular ejection fraction, by estimation, is 55 to 60%. The  left ventricle has normal function. The left ventricle has no regional  wall motion abnormalities. There is mild concentric left ventricular  hypertrophy. Left ventricular diastolic  parameters are indeterminate.   2. Right ventricular systolic function is normal. The right ventricular  size is normal. Tricuspid regurgitation signal is inadequate for assessing  PA pressure.   3. Left atrial size was moderately dilated.   4. The mitral valve is normal in structure. No evidence of mitral valve  regurgitation. No evidence of mitral stenosis.   5. The aortic valve is tricuspid. There is mild calcification of the  aortic valve. Aortic valve regurgitation is not visualized. No aortic  stenosis is present.   6. The inferior vena cava is dilated in size with <50% respiratory  variability, suggesting right atrial pressure of 15 mmHg.   7. 28% respirophasic variation of mitral E  inflow velocity (difficult  with irregular rhythm, possible atrial fibrillation). Slight RV diastolic  indentation that looks worse in subcostal views and slight right atrial  indentation. The IVC is dilated. I  suspect this is at least early tamponade physiology. Large pericardial  effusion. The pericardial effusion is circumferential.   8. The patient appears to be in atrial fibrillation.   Treatments: surgery:  Subxiphoid pericardial window by Dr. Roxan Hockey on 09/23/2022.  Pathology:  FINAL MICROSCOPIC DIAGNOSIS:   A. PERICARDIUM, BIOPSY:  - BENIGN PERICARDIAL TISSUE AND SKELETAL MUSCLE WITH CHRONIC  PERICARDITIS  - REACTIVE MESOTHELIAL CELLS  - NEGATIVE FOR MALIGNANCY   Cytology:  FINAL MICROSCOPIC DIAGNOSIS:  - No malignant cells identified  - Reactive mesothelial cells present   Discharge Exam: Performed by Wynelle Beckmann PA-C Blood pressure (!) 153/69, pulse 71, temperature 97.9 F (36.6 C), temperature source Oral, resp. rate 18, height '5\' 10"'$  (1.778 m), weight 81.2 kg, SpO2 97 %.  General appearance: alert, cooperative, and no distress Neurologic: intact Heart: irregularly irregular rhythm, no murmur or rub Lungs: Slightly diminished breath sounds bibasilar Abdomen: soft, non-tender; bowel sounds normal; no masses,  no organomegaly Extremities: extremities normal, atraumatic, no cyanosis or edema Wound: Clean and dry dressing in place   Discharge Medications:  Allergies as of  09/28/2022       Reactions   Plavix [clopidogrel Bisulfate] Other (See Comments)   GI Bleed   Uloric [febuxostat] Other (See Comments)   Unknown reaction        Medication List     STOP taking these medications    aspirin 81 MG tablet Replaced by: aspirin EC 81 MG tablet   sertraline 50 MG tablet Commonly known as: Zoloft       TAKE these medications    acetaminophen 500 MG tablet Commonly known as: TYLENOL Take 2 tablets (1,000 mg total) by mouth every 6 (six) hours  as needed.   allopurinol 100 MG tablet Commonly known as: ZYLOPRIM Take 1 tablet (100 mg total) by mouth 2 (two) times daily.   amLODipine-atorvastatin 10-40 MG tablet Commonly known as: CADUET Take 1 tablet by mouth every evening.   apixaban 5 MG Tabs tablet Commonly known as: ELIQUIS Take 1 tablet (5 mg total) by mouth 2 (two) times daily.   aspirin EC 81 MG tablet Take 1 tablet (81 mg total) by mouth daily. Swallow whole. Replaces: aspirin 81 MG tablet   cloNIDine 0.3 MG tablet Commonly known as: CATAPRES Take 0.3 mg by mouth 3 (three) times daily.   donepezil 5 MG tablet Commonly known as: ARICEPT Take 1 tablet (5 mg total) by mouth at bedtime.   doxazosin 2 MG tablet Commonly known as: CARDURA Take 4 mg by mouth 2 (two) times daily.   ferrous sulfate 325 (65 FE) MG tablet Take 325 mg by mouth in the morning and at bedtime.   furosemide 80 MG tablet Commonly known as: LASIX Take 80 mg by mouth 2 (two) times daily.   metoprolol tartrate 25 MG tablet Commonly known as: LOPRESSOR Take 1 tablet (25 mg total) by mouth 2 (two) times daily.   oxyCODONE 5 MG immediate release tablet Commonly known as: Oxy IR/ROXICODONE Take 1 tablet (5 mg total) by mouth every 6 (six) hours as needed for moderate pain.   potassium chloride 10 MEQ CR capsule Commonly known as: MICRO-K Take 10 mEq by mouth daily.   predniSONE 10 MG tablet Commonly known as: DELTASONE Take 1 tablet (10 mg total) by mouth daily before breakfast. Start taking on: September 29, 2022   Vitamin D3 50 MCG (2000 UT) Tabs Generic drug: Cholecalciferol Take 2,000 Units by mouth daily.        Follow-up [place]. Go on 10/13/2022.   Why: PA/LAT CXR to be taken on 03/19. Please arrive by 3:15 pm Contact information: [place] [address] [id number]         Melrose Nakayama, MD. Go on 10/13/2022.   Specialty: Cardiothoracic  Surgery Why: Appointment time is at 4:45 pm Contact information: [place] [address] [phone]         Sherran Needs, NP Follow up on 10/06/2022.   Specialties: Nurse Practitioner, Cardiology Why: Atrial fibrillation clinic follow up 1:30PM Contact information: [place] [address] [phone]                 Signed:  Ellwood Handler, PA-C  09/28/2022, 8:50 AM

## 2022-09-23 NOTE — Interval H&P Note (Signed)
History and Physical Interval Note:  09/23/2022 7:08 AM  James [name]  has presented today for surgery, with the diagnosis of PERICARDIAL EFFUSION.  The various methods of treatment have been discussed with the patient and family. After consideration of risks, benefits and other options for treatment, the patient has consented to  Procedure(s): SUBXYPHOID PERICARDIAL WINDOW (N/A) TRANSESOPHAGEAL ECHOCARDIOGRAM (N/A) as a surgical intervention.  The patient's history has been reviewed, patient examined, no change in status, stable for surgery.  I have reviewed the patient's chart and labs.  Questions were answered to the patient's satisfaction.     Melrose Nakayama

## 2022-09-23 NOTE — Brief Op Note (Signed)
09/23/2022  8:52 AM  PATIENT:  James [name]  77 y.o. male  PRE-OPERATIVE DIAGNOSIS:  PERICARDIAL EFFUSION  POST-OPERATIVE DIAGNOSIS:  PERICARDIAL EFFUSION  PROCEDURE:  Procedure(s):  SUBXYPHOID PERICARDIAL WINDOW (N/A) -1000 ml  TRANSESOPHAGEAL ECHOCARDIOGRAM (N/A)  SURGEON:  Surgeon(s) and Role:    * Melrose Nakayama, MD - Primary  PHYSICIAN ASSISTANT: Ellwood Handler PA-C  ASSISTANTS: none   ANESTHESIA:   general  EBL:  Per Anesthesia Record  BLOOD ADMINISTERED:none  DRAINS:  19 Blake Drain x 2     LOCAL MEDICATIONS USED:  NONE  SPECIMEN:  Source of Specimen:  Pleural Fluid  DISPOSITION OF SPECIMEN:   Microbiology, Cytology, Pathology  COUNTS:  YES  TOURNIQUET:  * No tourniquets in log *  DICTATION: .Dragon Dictation  PLAN OF CARE: Admit to inpatient   PATIENT DISPOSITION:  PACU - hemodynamically stable.   Delay start of Pharmacological VTE agent (>24hrs) due to surgical blood loss or risk of bleeding: yes

## 2022-09-23 NOTE — Discharge Instructions (Addendum)
Discharge Instructions:  1. You may shower, please wash incisions daily with soap and water and keep dry.  If you wish to cover wounds with dressing you may do so but please keep clean and change daily.  No tub baths or swimming until incisions have completely healed.  If your incisions become red or develop any drainage please call our office at [phone]  2. No Driving until cleared by Dr. Leonarda Salon office and you are no longer using narcotic pain medications  3. Fever of 101.5 for at least 24 hours with no source, please contact our office at [phone]  4. Activity- up as tolerated, please walk at least 3 times per day.  Avoid strenuous activity, no lifting, pushing, or pulling with your arms over 8-10 lbs for a minimum of 6 weeks  5. If any questions or concerns arise, please do not hesitate to contact our office at [phone]  __________________________________________________________________________________________________________________________ Information on my medicine - ELIQUIS (apixaban)  This medication education was reviewed with me or my healthcare representative as part of my discharge preparation.  The pharmacist that spoke with me during my hospital stay was:  Kaleen Mask, [facility]  Why was Eliquis prescribed for you? Eliquis was prescribed for you to reduce the risk of a blood clot forming that can cause a stroke if you have a medical condition called atrial fibrillation (a type of irregular heartbeat).  What do You need to know about Eliquis ? Take your Eliquis TWICE DAILY - one tablet in the morning and one tablet in the evening with or without food. If you have difficulty swallowing the tablet whole please discuss with your pharmacist how to take the medication safely.  Take Eliquis exactly as prescribed by your doctor and DO NOT stop taking Eliquis without talking to the doctor who prescribed the medication.  Stopping may increase your risk of developing  a stroke.  Refill your prescription before you run out.  After discharge, you should have regular check-up appointments with your healthcare provider that is prescribing your Eliquis.  In the future your dose may need to be changed if your kidney function or weight changes by a significant amount or as you get older.  What do you do if you miss a dose? If you miss a dose, take it as soon as you remember on the same day and resume taking twice daily.  Do not take more than one dose of ELIQUIS at the same time to make up a missed dose.  Important Safety Information A possible side effect of Eliquis is bleeding. You should call your healthcare provider right away if you experience any of the following: Bleeding from an injury or your nose that does not stop. Unusual colored urine (red or dark brown) or unusual colored stools (red or black). Unusual bruising for unknown reasons. A serious fall or if you hit your head (even if there is no bleeding).  Some medicines may interact with Eliquis and might increase your risk of bleeding or clotting while on Eliquis. To help avoid this, consult your healthcare provider or pharmacist prior to using any new prescription or non-prescription medications, including herbals, vitamins, non-steroidal anti-inflammatory drugs (NSAIDs) and supplements.  This website has more information on Eliquis (apixaban): http://www.eliquis.com/eliquis/home

## 2022-09-23 NOTE — Anesthesia Procedure Notes (Signed)
Procedure Name: Intubation Date/Time: 09/23/2022 7:49 AM  Performed by: Barrington Ellison, CRNAPre-anesthesia Checklist: Patient identified, Emergency Drugs available, Suction available and Patient being monitored Patient Re-evaluated:Patient Re-evaluated prior to induction Oxygen Delivery Method: Circle System Utilized Preoxygenation: Pre-oxygenation with 100% oxygen Induction Type: IV induction and Rapid sequence Laryngoscope Size: Glidescope and 4 Grade View: Grade I Tube type: Oral Tube size: 8.0 mm Number of attempts: 1 Airway Equipment and Method: Stylet and Oral airway Placement Confirmation: ETT inserted through vocal cords under direct vision, positive ETCO2 and breath sounds checked- equal and bilateral Secured at: 22 cm Tube secured with: Tape Dental Injury: Teeth and Oropharynx as per pre-operative assessment  Difficulty Due To: Difficulty was anticipated and Difficult Airway- due to anterior larynx Future Recommendations: Recommend- induction with short-acting agent, and alternative techniques readily available Comments: Previous anesthesia notes of anterior airway, electvie glidescope used, video laryngoscopy is recommended

## 2022-09-23 NOTE — Care Management (Signed)
  Transition of Care [facility]) Screening Note   Patient Details  Name: James [name] Date of Birth: Jan 16, 1946   Transition of Care [facility]) CM/SW Contact:    Bethena Roys, RN Phone Number: 09/23/2022, 11:16 AM    Transition of Care Department [facility]) has reviewed the patient and no TOC needs have been identified at this time. Patient  was discussed in morning progression rounds hx of pericardial effusions. S/p pericardial window. We will continue to monitor patient advancement through interdisciplinary progression rounds. If new patient transition needs arise, please place a TOC consult.

## 2022-09-23 NOTE — Anesthesia Postprocedure Evaluation (Signed)
Anesthesia Post Note  Patient: James [name]  Procedure(s) Performed: SUBXYPHOID PERICARDIAL WINDOW (Chest) TRANSESOPHAGEAL ECHOCARDIOGRAM     Patient location during evaluation: PACU Anesthesia Type: General Level of consciousness: awake and alert Pain management: pain level controlled Vital Signs Assessment: post-procedure vital signs reviewed and stable Respiratory status: spontaneous breathing, nonlabored ventilation, respiratory function stable and patient connected to nasal cannula oxygen Cardiovascular status: blood pressure returned to baseline and stable Postop Assessment: no apparent nausea or vomiting Anesthetic complications: yes  Encounter Notable Events  Notable Event Outcome Phase Comment  Difficult to intubate - expected  Intraprocedure Filed from anesthesia note documentation.    Last Vitals:  Vitals:   09/23/22 1245 09/23/22 1300  BP: (!) 156/74 (!) 157/80  Pulse: 91 92  Resp: 19 (!) 24  Temp:    SpO2: 99% 100%    Last Pain:  Vitals:   09/23/22 1049  TempSrc: Axillary  PainSc: 3                  Effie Berkshire

## 2022-09-23 NOTE — Anesthesia Procedure Notes (Signed)
Arterial Line Insertion Start/End2/28/2024 7:10 AM, 09/23/2022 7:15 AM Performed by: Barrington Ellison, CRNA, CRNA  Preanesthetic checklist: patient identified, risks and benefits discussed and pre-op evaluation Lidocaine 1% used for infiltration and patient sedated Right, radial was placed Catheter size: 20 G Hand hygiene performed  and maximum sterile barriers used   Procedure performed without using ultrasound guided technique. Following insertion, dressing applied and Biopatch. Post procedure assessment: normal and unchanged

## 2022-09-23 NOTE — Op Note (Signed)
James [name], GODETTE MEDICAL RECORD NO: [id number] ACCOUNT NO: [id number] DATE OF BIRTH: Mar 06, 1946 FACILITY: [facility] LOCATION: [facility] PHYSICIAN: Revonda Standard. Roxan Hockey, MD  Operative Report   DATE OF PROCEDURE: 09/23/2022  PREOPERATIVE DIAGNOSIS:  Large pericardial effusion.  POSTOPERATIVE DIAGNOSIS:  Large pericardial effusion.  PROCEDURE:  Subxiphoid pericardial window.  SURGEON:  Revonda Standard. Roxan Hockey, MD  ASSISTANT:  Ellwood Handler, PA-C.  ANESTHESIA:  General.  FINDINGS:  TEE showed a large pericardial effusion.  One liter of nonclotting bloody fluid evacuated.  Minimal residual fluid post-drainage.  CLINICAL NOTE:  [name] is a 77 year old gentleman with numerous medical problems including a longstanding pericardial effusion.  Recently, he has become more symptomatic.  Echocardiogram showed increased size of his pericardial effusion with early signs of tamponade.  He was advised to undergo subxiphoid pericardial window for diagnostic and therapeutic purposes.  The indications, risks, benefits, and alternatives were discussed in detail with the patient.  He understood and accepted the risks and agreed to proceed.  DESCRIPTION OF PROCEDURE:  [name] was brought to the preoperative holding area on 09/23/2022.  He had placement of a central venous catheter and an arterial blood pressure monitoring line by Dr. Smith Robert of Anesthesia. He was taken to the operating room and anesthetized and intubated.  He tolerated that well hemodynamically without any change in heart rate or blood pressure.  Sequential compression devices were placed on the calves for DVT prophylaxis.  A Bair Hugger was placed for active warming.  The chest and abdomen were prepped and draped in the usual sterile fashion.  A timeout was performed.  An incision was made centered over the xiphoid process.  The fascia was incised.  Upward traction was placed on the xiphoid and the dissection was carried down  to the pericardium. The pericardium was incised and there was a nonclotting bloody fluid present.  The fluid was sent for cytology, chemistry, cell count and cultures.  A window was created by excising a roughly 3 cm2 portion of the pericardium, which was sent for permanent pathology.  A sucker was placed into the chest and approximately a liter of fluid was removed in all. Reexamination with transesophageal echocardiography showed near complete resolution of the effusion with only a small amount posteriorly.  Two 19-French Blake drains were placed through separate incisions.  One was placed anteriorly to the right and the other was placed posteriorly behind the heart.  Both were secured with #1 silk sutures.  The wound was irrigated.  It was then closed in standard fashion.  The chest tubes were placed to a Pleur-Evac.  The patient was extubated in the operating room and taken to the postanesthetic care unit in good condition.  Experienced assistance was necessary for this case.  Erin Barrett, PA provided assistance with exposure, retraction and wound closure.  MUK D: 09/23/2022 9:43:08 am T: 09/23/2022 9:51:00 am  JOB: [id number]

## 2022-09-24 ENCOUNTER — Encounter (HOSPITAL_COMMUNITY): Payer: Self-pay | Admitting: Thoracic Surgery (Cardiothoracic Vascular Surgery)

## 2022-09-24 ENCOUNTER — Inpatient Hospital Stay (HOSPITAL_COMMUNITY): Payer: Medicare Other

## 2022-09-24 DIAGNOSIS — Z9889 Other specified postprocedural states: Secondary | ICD-10-CM

## 2022-09-24 DIAGNOSIS — D6869 Other thrombophilia: Secondary | ICD-10-CM | POA: Diagnosis not present

## 2022-09-24 DIAGNOSIS — I480 Paroxysmal atrial fibrillation: Secondary | ICD-10-CM | POA: Diagnosis not present

## 2022-09-24 LAB — CBC
HCT: 31.1 % — ABNORMAL LOW (ref 39.0–52.0)
Hemoglobin: 10.1 g/dL — ABNORMAL LOW (ref 13.0–17.0)
MCH: 27.4 pg (ref 26.0–34.0)
MCHC: 32.5 g/dL (ref 30.0–36.0)
MCV: 84.3 fL (ref 80.0–100.0)
Platelets: 281 10*3/uL (ref 150–400)
RBC: 3.69 MIL/uL — ABNORMAL LOW (ref 4.22–5.81)
RDW: 14.9 % (ref 11.5–15.5)
WBC: 11.5 10*3/uL — ABNORMAL HIGH (ref 4.0–10.5)
nRBC: 0 % (ref 0.0–0.2)

## 2022-09-24 LAB — GLUCOSE, BODY FLUID OTHER: Glucose, Body Fluid Other: 114 mg/dL

## 2022-09-24 LAB — BASIC METABOLIC PANEL
Anion gap: 9 (ref 5–15)
BUN: 21 mg/dL (ref 8–23)
CO2: 27 mmol/L (ref 22–32)
Calcium: 8.3 mg/dL — ABNORMAL LOW (ref 8.9–10.3)
Chloride: 98 mmol/L (ref 98–111)
Creatinine, Ser: 2.53 mg/dL — ABNORMAL HIGH (ref 0.61–1.24)
GFR, Estimated: 25 mL/min — ABNORMAL LOW (ref >60)
Glucose, Bld: 156 mg/dL — ABNORMAL HIGH (ref 70–99)
Potassium: 3.5 mmol/L (ref 3.5–5.1)
Sodium: 134 mmol/L — ABNORMAL LOW (ref 135–145)

## 2022-09-24 LAB — LD, BODY FLUID (OTHER): LD, Body Fluid: 748 IU/L

## 2022-09-24 LAB — ECHO INTRAOPERATIVE TEE
Height: 70 in
Weight: 2864.22 oz

## 2022-09-24 LAB — PROTEIN, BODY FLUID (OTHER): Total Protein, Body Fluid Other: 5.1 g/dL

## 2022-09-24 MED ORDER — METOPROLOL TARTRATE 12.5 MG HALF TABLET
12.5000 mg | ORAL_TABLET | Freq: Two times a day (BID) | ORAL | Status: DC
  Administered 2022-09-24 – 2022-09-27 (×5): 12.5 mg via ORAL
  Filled 2022-09-24 (×6): qty 1

## 2022-09-24 MED ORDER — POTASSIUM CHLORIDE CRYS ER 20 MEQ PO TBCR
30.0000 meq | EXTENDED_RELEASE_TABLET | Freq: Once | ORAL | Status: AC
  Administered 2022-09-24: 30 meq via ORAL
  Filled 2022-09-24: qty 1

## 2022-09-24 MED ORDER — DEXTROSE-NACL 5-0.45 % IV SOLN: INTRAVENOUS | Status: DC

## 2022-09-24 NOTE — Progress Notes (Addendum)
[facility] DAILY ICU PROGRESS NOTE                   [address]            [place] [address]          [phone]   1 Day Post-Op Procedure(s) (LRB): SUBXYPHOID PERICARDIAL WINDOW (N/A) TRANSESOPHAGEAL ECHOCARDIOGRAM (N/A)  Total Length of Stay:  LOS: 1 day   Subjective: Patient awake, alert. He is not having much pain.  Objective: Vital signs in last 24 hours: Temp:  [97.5 F (36.4 C)-97.9 F (36.6 C)] 97.9 F (36.6 C) (02/29 0800) Pulse Rate:  [44-114] 57 (02/29 0600) Cardiac Rhythm: Atrial fibrillation (02/29 0400) Resp:  [13-38] 14 (02/29 0600) BP: (128-172)/(60-100) 132/71 (02/29 0600) SpO2:  [91 %-100 %] 97 % (02/29 0600) Arterial Line BP: (176-181)/(51-55) 181/51 (02/28 0930)  Filed Weights   09/23/22 0543  Weight: 81.2 kg    Weight change:       Intake/Output from previous day: 02/28 0701 - 02/29 0700 In: 1614.7 [P.O.:340; I.V.:1154.2; IV Piggyback:120.5] Out: 2605 [Urine:1475; Blood:10; Chest Tube:220]  Intake/Output this shift: No intake/output data recorded.  Current Meds: Scheduled Meds:  acetaminophen  1,000 mg Oral Q6H   Or   acetaminophen (TYLENOL) oral liquid 160 mg/5 mL  1,000 mg Oral Q6H   allopurinol  100 mg Oral BID   aspirin EC  81 mg Oral Daily   atorvastatin  40 mg Oral q1800   bisacodyl  10 mg Oral Daily   Chlorhexidine Gluconate Cloth  6 each Topical Daily   cloNIDine  0.3 mg Oral TID   donepezil  5 mg Oral QHS   doxazosin  4 mg Oral BID   ferrous sulfate  325 mg Oral BID WC   furosemide  80 mg Oral BID   metoprolol tartrate  25 mg Oral BID   pantoprazole  40 mg Oral Daily   potassium chloride  10 mEq Oral Daily   senna-docusate  1 tablet Oral QHS   sodium chloride flush  10-40 mL Intracatheter Q12H   Continuous Infusions:  dextrose 5 % and 0.45% NaCl 50 mL/hr at 09/23/22 2316   PRN Meds:.morphine injection, ondansetron (ZOFRAN) IV, mouth rinse, oxyCODONE, sodium chloride flush, traMADol  General  appearance: alert, cooperative, and no distress Heart: IRRR IRRR Lungs: clear to auscultation bilaterally Abdomen: Soft, non tender, bowel sounds present Extremities: SCDs in place Wound: Clean and dry  Lab Results: CBC: Recent Labs    09/23/22 0611 09/24/22 0634  WBC 6.0 11.5*  HGB 10.6* 10.1*  HCT 31.6* 31.1*  PLT 285 281   BMET:  Recent Labs    09/23/22 0611 09/24/22 0634  NA 136 134*  K 3.5 3.5  CL 97* 98  CO2 27 27  GLUCOSE 123* 156*  BUN 22 21  CREATININE 2.69* 2.53*  CALCIUM 8.9 8.3*    CMET: Lab Results  Component Value Date   WBC 11.5 (H) 09/24/2022   HGB 10.1 (L) 09/24/2022   HCT 31.1 (L) 09/24/2022   PLT 281 09/24/2022   GLUCOSE 156 (H) 09/24/2022   CHOL 131 08/21/2022   TRIG 82 08/21/2022   HDL 68 08/21/2022   LDLCALC 47 08/21/2022   ALT 17 09/16/2022   AST 21 09/16/2022   NA 134 (L) 09/24/2022   K 3.5 09/24/2022   CL 98 09/24/2022   CREATININE 2.53 (H) 09/24/2022   BUN 21 09/24/2022   CO2 27 09/24/2022   TSH 0.813  08/21/2022   PSA 2.5 06/12/2014   INR 1.1 09/16/2022   HGBA1C 5.4 08/21/2022      PT/INR: No results for input(s): "LABPROT", "INR" in the last 72 hours. Radiology: [facility] Chest Port 1 View  Result Date: 09/23/2022 CLINICAL DATA:  Status post pericardial window. EXAM: PORTABLE CHEST 1 VIEW COMPARISON:  09/23/2022 FINDINGS: There is a right IJ catheter with tip in the projection of the SVC. No pneumothorax identified. Interval placement pericardial drains. There is been decrease in overall cardiac enlargement compatible with resolving pericardial effusion. Persistent left pleural effusion with left lower lobe atelectasis. Mild interstitial edema noted. IMPRESSION: 1. Interval placement of pericardial drains with decrease in cardiac enlargement compatible with resolving pericardial effusion. 2. Persistent left pleural effusion with left lower lobe atelectasis. 3. Mild interstitial edema. Electronically Signed   By: Kerby Moors M.D.    On: 09/23/2022 10:02   EP STUDY  Result Date: 09/23/2022 See surgical note for result.    Assessment/Plan: S/P Procedure(s) (LRB): SUBXYPHOID PERICARDIAL WINDOW (N/A) TRANSESOPHAGEAL ECHOCARDIOGRAM (N/A) CV-Pericardial drains with 220 cc.Pericardial tubes are to suction, will place to water seal.  CXR this am shows cardiomegaly, small left pleural effusion, no pneumothorax. Drains to remain today. Gram stain of pericardial fluid shows no organisms seen, culture shows no growth < 24 hours. A fib this am with slow HR. Will consult cardiology. If he needs anticoagulation, recommend not starting for 4-5 days as had bloody like pericardial effusion. On Clonidine 0.3 mg tid, Lopressor 25 mg bid (with parameters) 2. Gently supplement potassium 3. History of CKD (stage IV)-creatinine this am slightly decreased from 2.69 to 2.53. On Lasix 80 mg po bid (as taken prior to surgery). Monitor creatinine.  Chronic Kidney Disease   Stage I     GFR >90  Stage II    GFR 60-89  Stage IIIA GFR 45-59  Stage IIIB GFR 30-44  Stage IV   GFR 15-29  Stage V    GFR  <15  Lab Results  Component Value Date   CREATININE 2.53 (H) 09/24/2022   Estimated Creatinine Clearance: 25.2 mL/min (A) (by C-G formula based on SCr of 2.53 mg/dL (H)).  4. Anemia-H and H this am stable at 10.1 and 31.1. On Ferrous sulfate bid 5. Transfer to [place] PA-C 09/24/2022 8:06 AM

## 2022-09-24 NOTE — Consult Note (Addendum)
Cardiology Consultation   Patient ID: MARWOOD CORKILL MRN: [id number]; DOB: 1945/11/06  Admit date: 09/23/2022 Date of Consult: 09/24/2022  PCP:  [name], [name], MD   [place] [facility] Cardiologist:  Minus Breeding, MD        Patient Profile:   James [name] is a 77 y.o. male with a hx of hypertension, hyperlipidemia, pericardial effusion (s/p window), severe thoracic and abdominal aortic atherosclerosis, renal artery stenosis, s/p bilateral carotid endarterectomy, DVT, who is being seen 09/24/2022 for the evaluation of at the request of Donielle PA-C with CT surgery.  History of Present Illness:   Mr. Coreas is a 77 year old male with above noted medical history who was admitted for pericardial window management of worsening chronic large pericardial effusion. This was first observed in March of 2022 but remained stable and did not cause significant symptoms until recently. Patient visited the ED on 08/30/22 due to chest pain. With known effusion, echocardiogram ordered but patient left AMA before this was completed. 11 days after leaving the hospital, patient called the cardiology clinic reporting worsening shortness of breath and low BP. Outpatient echocardiogram was completed 09/11/22 and showed worsening effusion with early tamponade. Image motion also concerning for afib. Patient saw Dr. Roxan Hockey in [place] surgery clinic on 2/19 and decision made to perform subxiphoid pericardial window on 09/21/22. On 2/29 patient noted to be in afib with slow ventricular response and cardiology was consulted.   Today patient says that he has not felt any palpitations, lightheadedness/dizziness in recent weeks. Denies chest pain today other than at site of pericardial drain. Patient does report use of CPAP at night though says that he stopped using it about 2 weeks ago due to frustration with nighttime fit.   Past Medical History:  Diagnosis Date   Arthritis    Carotid  artery occlusion    Chronic kidney disease    DVT (deep venous thrombosis) ([place]) 2008   GERD (gastroesophageal reflux disease)    takes Protonix daily   Gout    takes Allopurinol daily and Indomethacin prn   H/O renal artery stenosis    H/O: GI bleed    History of blood transfusion    History of colon polyps    History of gastric ulcer    Hyperlipidemia    takes Lipitor daily   Hypertension    takes Metoprolol and Amlodipine daily   Joint pain    Joint swelling    Sleep apnea     Past Surgical History:  Procedure Laterality Date   CAROTID ENDARTERECTOMY  2008   right CEA   CHOLECYSTECTOMY  mid 90's   COLON SURGERY     COLONOSCOPY     ENDARTERECTOMY  06/02/2012   Procedure: ENDARTERECTOMY CAROTID;  Surgeon: Serafina Mitchell, MD;  Location: [facility];  Service: Vascular;  Laterality: Left;   ESOPHAGOGASTRODUODENOSCOPY     HERNIA REPAIR     umbilical hernia   PATCH ANGIOPLASTY  06/02/2012   Procedure: PATCH ANGIOPLASTY;  Surgeon: Serafina Mitchell, MD;  Location: [facility];  Service: Vascular;  Laterality: Left;  using Vascu-Guard Patch   PERIPHERAL VASCULAR CATHETERIZATION Right 12/03/2015   Procedure: Peripheral Vascular Balloon Angioplasty;  Surgeon: Serafina Mitchell, MD;  Location: [facility];  Service: Cardiovascular;  Laterality: Right;  RENAL unsuccessful   RENAL ARTERY STENT  2009   Right renal artery stenting-  Right Kidney   small intestine removed  2001   d/t random gi bleeding  SUBXYPHOID PERICARDIAL WINDOW N/A 09/23/2022   Procedure: SUBXYPHOID PERICARDIAL WINDOW;  Surgeon: Melrose Nakayama, MD;  Location: [facility];  Service: Thoracic;  Laterality: N/A;   TEE WITHOUT CARDIOVERSION N/A 09/23/2022   Procedure: TRANSESOPHAGEAL ECHOCARDIOGRAM;  Surgeon: Melrose Nakayama, MD;  Location: [facility] OR;  Service: Thoracic;  Laterality: N/A;     Home Medications:  Prior to Admission medications   Medication Sig Start Date End Date Taking? Authorizing Provider  allopurinol  (ZYLOPRIM) 100 MG tablet Take 2 tablets (200 mg total) by mouth daily. Patient taking differently: Take 100 mg by mouth 2 (two) times daily. 02/13/22  Yes [name], [name], MD  amLODipine-atorvastatin (CADUET) 10-40 MG tablet Take 1 tablet by mouth daily. Patient taking differently: Take 1 tablet by mouth every evening. 02/13/22  Yes [name], [name], MD  aspirin 81 MG tablet Take 81 mg by mouth daily.   Yes [provider]  Cholecalciferol (VITAMIN D3) 50 MCG (2000 UT) TABS Take 2,000 Units by mouth daily.   Yes [provider]  cloNIDine (CATAPRES) 0.3 MG tablet Take 0.3 mg by mouth 3 (three) times daily.   Yes [provider]  donepezil (ARICEPT) 5 MG tablet Take 1 tablet (5 mg total) by mouth at bedtime. 02/13/22  Yes [name], [name], MD  doxazosin (CARDURA) 2 MG tablet Take 4 mg by mouth 2 (two) times daily. 08/24/19  Yes [provider]  ferrous sulfate 325 (65 FE) MG tablet Take 325 mg by mouth in the morning and at bedtime.   Yes [provider]  furosemide (LASIX) 80 MG tablet Take 80 mg by mouth 2 (two) times daily.   Yes [provider]  potassium chloride (MICRO-K) 10 MEQ CR capsule Take 10 mEq by mouth daily. 12/10/19  Yes [provider]  sertraline (ZOLOFT) 50 MG tablet Take 1 tablet (50 mg total) by mouth daily. Patient not taking: Reported on 09/16/2022 09/09/22   [name], [name], MD    Inpatient Medications: Scheduled Meds:  acetaminophen  1,000 mg Oral Q6H   Or   acetaminophen (TYLENOL) oral liquid 160 mg/5 mL  1,000 mg Oral Q6H   allopurinol  100 mg Oral BID   aspirin EC  81 mg Oral Daily   atorvastatin  40 mg Oral q1800   bisacodyl  10 mg Oral Daily   Chlorhexidine Gluconate Cloth  6 each Topical Daily   cloNIDine  0.3 mg Oral TID   donepezil  5 mg Oral QHS   doxazosin  4 mg Oral BID   ferrous sulfate  325 mg Oral BID WC   furosemide  80 mg Oral BID   metoprolol tartrate  25 mg Oral BID    pantoprazole  40 mg Oral Daily   senna-docusate  1 tablet Oral QHS   sodium chloride flush  10-40 mL Intracatheter Q12H   Continuous Infusions:  dextrose 5 % and 0.45% NaCl 10 mL/hr at 09/24/22 0908   PRN Meds: morphine injection, ondansetron (ZOFRAN) IV, mouth rinse, oxyCODONE, sodium chloride flush, traMADol  Allergies:    Allergies  Allergen Reactions   Plavix [Clopidogrel Bisulfate] Other (See Comments)    GI Bleed   Uloric [Febuxostat] Other (See Comments)    Unknown reaction    Social History:   Social History   Socioeconomic History   Marital status: Married    Spouse name: Melrose   Number of children: 2   Years of education: 14   Highest education level: Associate degree: occupational,  technical, or vocational program  Occupational History   Occupation: Retired  Tobacco Use   Smoking status: Former    Packs/day: 1.50    Years: 36.00    Total pack years: 54.00    Types: Cigarettes    Quit date: 12/26/1999    Years since quitting: 22.7   Smokeless tobacco: Never  Vaping Use   Vaping Use: Never used  Substance and Sexual Activity   Alcohol use: No    Comment: Quit in 1998   Drug use: No   Sexual activity: Yes    Birth control/protection: None  Other Topics Concern   Not on file  Social History Narrative   Lives with wife.     Social Determinants of Health   Financial Resource Strain: Low Risk  (09/21/2019)   Overall Financial Resource Strain (CARDIA)    Difficulty of Paying Living Expenses: Not hard at all  Food Insecurity: No Food Insecurity (09/23/2022)   Hunger Vital Sign    Worried About Running Out of Food in the Last Year: Never true    Ran Out of Food in the Last Year: Never true  Transportation Needs: No Transportation Needs (09/23/2022)   PRAPARE - Hydrologist (Medical): No    Lack of Transportation (Non-Medical): No  Physical Activity: Inactive (09/21/2019)   Exercise Vital Sign    Days of Exercise per Week: 0  days    Minutes of Exercise per Session: 0 min  Stress: No Stress Concern Present (09/21/2019)   [place]    Feeling of Stress : Not at all  Social Connections: [place] (09/21/2019)   Social Connection and Isolation Panel [NHANES]    Frequency of Communication with Friends and Family: More than three times a week    Frequency of Social Gatherings with Friends and Family: More than three times a week    Attends Religious Services: More than 4 times per year    Active Member of [employer] or Organizations: Yes    Attends Music therapist: More than 4 times per year    Marital Status: Married  Human resources officer Violence: Not At Risk (09/23/2022)   Humiliation, Afraid, Rape, and Kick questionnaire    Fear of Current or Ex-Partner: No    Emotionally Abused: No    Physically Abused: No    Sexually Abused: No    Family History:    Family History  Problem Relation Age of Onset   Hypertension Mother    Hyperlipidemia Mother    Other Mother        varicose veins   Hyperlipidemia Father    Hypertension Father    Diabetes Father    Clotting disorder Father    Deep vein thrombosis Father    Hypertension Sister    Diabetes Sister    Hyperlipidemia Sister    Varicose Veins Sister    Other Sister        Bleeding problems   Hypertension Brother    Hyperlipidemia Brother      ROS:  Please see the history of present illness.   All other ROS reviewed and negative.     Physical Exam/Data:   Vitals:   09/24/22 0600 09/24/22 0800 09/24/22 0905 09/24/22 1113  BP: 132/71  (!) 142/69   Pulse: (!) 57     Resp: 14     Temp:  97.9 F (36.6 C)  (!) 97.5 F (36.4 C)  TempSrc:  Oral  Oral  SpO2: 97%     Weight:      Height:        Intake/Output Summary (Last 24 hours) at 09/24/2022 1351 Last data filed at 09/24/2022 0900 Gross per 24 hour  Intake 1272.32 ml  Output 1085 ml  Net 187.32 ml       09/23/2022    5:43 AM 09/16/2022    1:06 PM 09/14/2022    2:55 PM  Last 3 Weights  Weight (lbs) 179 lb 0.2 oz 179 lb 180 lb  Weight (kg) 81.2 kg 81.194 kg 81.647 kg     Body mass index is 25.69 kg/m.  General:  Well nourished, well developed, in no acute distress HEENT: normal Neck: no JVD Vascular: No carotid bruits; Distal pulses 2+ bilaterally Cardiac:  normal S1, S2; irregularly irregular; no murmur  Lungs:  clear to auscultation bilaterally, no wheezing, rhonchi or rales  Abd: soft, nontender, no hepatomegaly  Ext: no edema Musculoskeletal:  No deformities, BUE and BLE strength normal and equal Skin: warm and dry  Neuro:  CNs 2-12 intact, no focal abnormalities noted Psych:  Normal affect   EKG:  The EKG was personally reviewed and demonstrates:  atrial fibrillation, LAFB, ventricular rate in the 80s.  Telemetry:  Telemetry was personally reviewed and demonstrates:  atrial fibrillation with intermittent slow ventricular response. Rates typically 51s. Intermittent pauses up to 2.5 seconds, appears to be occurring while patient is sleeping. Rates rose to 60s while I saw patient.   Relevant CV Studies:  09/11/22 TTE  IMPRESSIONS     1. Left ventricular ejection fraction, by estimation, is 55 to 60%. The  left ventricle has normal function. The left ventricle has no regional  wall motion abnormalities. There is mild concentric left ventricular  hypertrophy. Left ventricular diastolic  parameters are indeterminate.   2. Right ventricular systolic function is normal. The right ventricular  size is normal. Tricuspid regurgitation signal is inadequate for assessing  PA pressure.   3. Left atrial size was moderately dilated.   4. The mitral valve is normal in structure. No evidence of mitral valve  regurgitation. No evidence of mitral stenosis.   5. The aortic valve is tricuspid. There is mild calcification of the  aortic valve. Aortic valve regurgitation is not visualized. No  aortic  stenosis is present.   6. The inferior vena cava is dilated in size with <50% respiratory  variability, suggesting right atrial pressure of 15 mmHg.   7. 28% respirophasic variation of mitral E inflow velocity (difficult  with irregular rhythm, possible atrial fibrillation). Slight RV diastolic  indentation that looks worse in subcostal views and slight right atrial  indentation. The IVC is dilated. I  suspect this is at least early tamponade physiology. Large pericardial  effusion. The pericardial effusion is circumferential.   8. The patient appears to be in atrial fibrillation.   FINDINGS   Left Ventricle: Left ventricular ejection fraction, by estimation, is 55  to 60%. The left ventricle has normal function. The left ventricle has no  regional wall motion abnormalities. The left ventricular internal cavity  size was normal in size. There is   mild concentric left ventricular hypertrophy. Left ventricular diastolic  parameters are indeterminate.   Right Ventricle: The right ventricular size is normal. No increase in  right ventricular wall thickness. Right ventricular systolic function is  normal. Tricuspid regurgitation signal is inadequate for assessing PA  pressure.   Left Atrium: Left atrial size was moderately  dilated.   Right Atrium: Right atrial size was normal in size.   Pericardium: 28% respirophasic variation of mitral E inflow velocity  (difficult with irregular rhythm, possible atrial fibrillation). Slight RV  diastolic indentation that looks worse in subcostal views and slight right  atrial indentation. The IVC is  dilated. I suspect this is at least early tamponade physiology. A large  pericardial effusion is present. The pericardial effusion is  circumferential.   Mitral Valve: The mitral valve is normal in structure. No evidence of  mitral valve regurgitation. No evidence of mitral valve stenosis.   Tricuspid Valve: The tricuspid valve is normal in  structure. Tricuspid  valve regurgitation is not demonstrated.   Aortic Valve: The aortic valve is tricuspid. There is mild calcification  of the aortic valve. Aortic valve regurgitation is not visualized. No  aortic stenosis is present.   Pulmonic Valve: The pulmonic valve was normal in structure. Pulmonic valve  regurgitation is not visualized.   Aorta: The aortic root is normal in size and structure.   Venous: The inferior vena cava is dilated in size with less than 50%  respiratory variability, suggesting right atrial pressure of 15 mmHg.   IAS/Shunts: No atrial level shunt detected by color flow Doppler.    Laboratory Data:  High Sensitivity Troponin:   Recent Labs  Lab 08/30/22 0921 08/30/22 1141  TROPONINIHS 8 9     Chemistry Recent Labs  Lab 09/23/22 0611 09/24/22 0634  NA 136 134*  K 3.5 3.5  CL 97* 98  CO2 27 27  GLUCOSE 123* 156*  BUN 22 21  CREATININE 2.69* 2.53*  CALCIUM 8.9 8.3*  GFRNONAA 24* 25*  ANIONGAP 12 9    No results for input(s): "PROT", "ALBUMIN", "AST", "ALT", "ALKPHOS", "BILITOT" in the last 168 hours. Lipids No results for input(s): "CHOL", "TRIG", "HDL", "LABVLDL", "LDLCALC", "CHOLHDL" in the last 168 hours.  Hematology Recent Labs  Lab 09/23/22 0611 09/24/22 0634  WBC 6.0 11.5*  RBC 3.81* 3.69*  HGB 10.6* 10.1*  HCT 31.6* 31.1*  MCV 82.9 84.3  MCH 27.8 27.4  MCHC 33.5 32.5  RDW 14.8 14.9  PLT 285 281   Thyroid No results for input(s): "TSH", "FREET4" in the last 168 hours.  BNPNo results for input(s): "BNP", "PROBNP" in the last 168 hours.  DDimer No results for input(s): "DDIMER" in the last 168 hours.   Radiology/Studies:  DG Chest Port 1 View  Result Date: 09/24/2022 CLINICAL DATA:  Status post pericardial window creation EXAM: PORTABLE CHEST 1 VIEW COMPARISON:  Chest radiograph dated 09/15/2022 FINDINGS: Lines/tubes: Right internal jugular venous catheter tip projects over the superior cavoatrial junction. Similar  mediastinal drainage catheters. Chest: Similar biapical pleural-parenchymal scarring. Persistent dense left retrocardiac opacity. Pleura: Similar small left pleural effusion. Trace right pleural effusion. No pneumothorax. Heart/mediastinum: Similar enlarged cardiomediastinal silhouette. Bones: No acute osseous abnormality. IMPRESSION: 1. Similar small left pleural effusion and trace right pleural effusion. 2. Persistent dense left retrocardiac opacity, likely atelectasis. 3. Cardiomegaly. Electronically Signed   By: Darrin Nipper M.D.   On: 09/24/2022 08:14   DG Chest Port 1 View  Result Date: 09/23/2022 CLINICAL DATA:  Status post pericardial window. EXAM: PORTABLE CHEST 1 VIEW COMPARISON:  09/23/2022 FINDINGS: There is a right IJ catheter with tip in the projection of the SVC. No pneumothorax identified. Interval placement pericardial drains. There is been decrease in overall cardiac enlargement compatible with resolving pericardial effusion. Persistent left pleural effusion with left lower lobe atelectasis. Mild interstitial  edema noted. IMPRESSION: 1. Interval placement of pericardial drains with decrease in cardiac enlargement compatible with resolving pericardial effusion. 2. Persistent left pleural effusion with left lower lobe atelectasis. 3. Mild interstitial edema. Electronically Signed   By: Kerby Moors M.D.   On: 09/23/2022 10:02   EP STUDY  Result Date: 09/23/2022 See surgical note for result.  DG Chest 2 View  Result Date: 09/23/2022 CLINICAL DATA:  Pre-procedure examination. EXAM: CHEST - 2 VIEW COMPARISON:  09/16/2022 FINDINGS: There is diffuse cardiac enlargement which likely reflects a combination of multi chamber cardiac enlargement and pericardial effusion. Moderate left pleural effusion. Small right pleural effusion. Atelectasis identified within the lingula. No interstitial edema or airspace disease. Visualized osseous structures are unremarkable. IMPRESSION: 1. Increased size of  cardiac silhouette compatible with multi chamber cardiac enlargement and pericardial effusion. 2. Moderate left and small right pleural effusions. 3. Lingular atelectasis. Electronically Signed   By: Kerby Moors M.D.   On: 09/23/2022 06:52     Assessment and Plan:   Atrial fibrillation (new) Secondary hypercoagulable state CHA2DS2-VASc Score = 4   Patient admitted for pericardial window to manage worsening chronic pericardial effusion, noted to have afib on 2/29. Appears that he may have also been in afib as far back as 2/16 when TTE was completed.   Given bloody appearance to pericardial fluid, per CTS patient should not be anticoagulated for at another 4-5 days.  Currently rate controlled with Metoprolol Tartrate '25mg'$  BID. If patient were to develop more significant bradycardia and/or symptoms, reduce dose to 12.'5mg'$  BID. Given inability to add [place] at this time, will continue with rate control strategy and refer to afib clinic for future rhythm management. Plan to consolidate metoprolol on discharge.  Nightly CPAP compliance will be important for rhythm management going forward.   Hyperlipidemia  Continue statin  Hypertension  Continue home amlodipine, clonidine. Managed by nephrology  Pericardial effusion  Patient with chronic large pericardial effusion admitted for sub-xiphoid window. ~1L non-clotting bloody fluid evacuated. Fluid analysis is currently pending. Unclear etiology with effusion first noted in 2022.   CKD IV  Patient followed by nephrology. Creatinine appears to be near baseline. Continue to monitor closely.   Risk Assessment/Risk Scores:          CHA2DS2-VASc Score = 4   This indicates a 4.8% annual risk of stroke. The patient's score is based upon: CHF History: 0 HTN History: 1 Diabetes History: 0 Stroke History: 0 Vascular Disease History: 1 Age Score: 2 Gender Score: 0         For questions or updates, please contact [place] Please consult [email or web address] for contact info under    Signed, Lily Kocher, PA-C  09/24/2022 1:51 PM  Patient seen and examined and agree with Lily Kocher, PA-C as detailed above.  In brief, the patient is a 77 y.o. male with a hx of hypertension, hyperlipidemia, pericardial effusion (s/p window), severe thoracic and abdominal aortic atherosclerosis, renal artery stenosis, s/p bilateral carotid endarterectomy, and DVT who was admitted for a pericardial window for management of progression of large pericardial effusion with course complicated by new Afib for which Cardiology was consulted.   Today, the patient is very well rate controlled with HR 50-60s. Having frequent <3s pauses on metoprolol '25mg'$  BID. Not currently on [facility] due to recent procedure. Will decrease metop to 12.'5mg'$  BID for now given frequent 2s pauses and controlled HR. Plan to start apixaban once cleared by surgery and arrange for Afib clinic follow-up  as outpatient. If remains in Afib, can proceed with DCCV after 3 weeks of AC.   GEN: No acute distress.   Neck: CVL in place Cardiac: Irregular, no murmurs Respiratory: Clear to auscultation bilaterally. GI: Soft, nontender, non-distended  MS: 2+ LE edema, warm Neuro:  Nonfocal  Psych: Normal affect    Plan: -Will decrease metop to 12.'5mg'$  BID given frequent 2s pauses on tele and patient overall rate controlled -Not on AC currently due to recent procedure; will plan to start apixaban once cleared by CT surgery -Will arrange for follow-up in Afib clinic for further management going forward; if remains in Afib can pursue DCCV after 3 weeks AC -Follow-up fluid studies from pericardial effusion -Post-op care per CT surgery  Gwyndolyn Kaufman, MD

## 2022-09-24 NOTE — Hospital Course (Addendum)
History of Present Illness:  HPI: Mr. James [name] is sent for consultation regarding a pericardial effusion.   James [name] is a 77 year old man with numerous medical problems including a pericardial effusion, severe thoracic and abdominal aortic atherosclerosis, renal artery stenosis, bilateral carotid endarterectomy, DVT, hypertension, hyperlipidemia, ulcers, reflux, arthritis, and gout.   He has had a longstanding pericardial effusion.  He has chronic peripheral edema.  Recently was in the emergency room on 08/30/2021 with gastrointestinal complaints.  As part of his workup he had a CT of the chest abdomen and pelvis.  It showed a large pericardial effusion.  He left AMA at that time.  In the meantime he has become progressively more short of breath.  Now even with minimal activities.  Also has had worsening of his peripheral edema.  Repeat echocardiogram showed an increase in size of the pericardial effusion with some early signs of tamponade.  He was evaluated by Dr. Roxan Hockey who recommended sub-xiphoid pericardial window creation.  The risks and benefits of the procedure were explained to the patient and he was agreeable to proceed.  Hospital Course:  James [name] presented to [facility] on 09/23/2022.  He was taken to the operating room and underwent Sub-xiphoid pericardial window with removal of 1000 ml of bloody serous drainage.  He tolerated the procedure without difficulty and was taken to the PACU in stable condition. He went into a fib with a controlled ventricular rate, bradycardic at times. Cardiology was consulted. Lopressor was decreased to 12.5 mg bid. He will be referred to the a fib clinic at discharge. Because the pericardial drainage was hemorrhagic, it is recommended that anticoagulation not be started for 4-5 days from the day of surgery. He was hypertensive on 03/01 so Amlodipine was restarted (he took Caduet prior to surgery). He has a history of CKD (stage IV)  and was on Lasix 80 mg po bid prior to surgery. Creatinine was monitored and remained stable. He did have anemia (has a history) and he was restarted on oral Ferrous sulfate bid. He has a history of OSA and used CPAP at night. He was surgically stable for transfer from the ICU to [facility] on 02/29 and a bed was available on 03/01. Pericardial drains output gradually decreased and daily chest x rays remained stable. All drains were removed by 03/03.  The patient will be treated with a 30 day course of steroids.  He has been resumed on home Eliquis.  He is ambulating without difficulty. His surgical incisions are free from signs of infection.  He is medically stable for discharge home today.

## 2022-09-24 NOTE — Progress Notes (Signed)
EVENING ROUNDS NOTE :     [address]       [place] [address]             [phone]                 1 Day Post-Op Procedure(s) (LRB): SUBXYPHOID PERICARDIAL WINDOW (N/A) TRANSESOPHAGEAL ECHOCARDIOGRAM (N/A)   Total Length of Stay:  LOS: 1 day  Events:   Stable day Low CT output    BP 130/61   Pulse 64   Temp (!) 97.5 F (36.4 C) (Oral)   Resp 19   Ht '5\' 10"'$  (1.778 m)   Wt 81.2 kg   SpO2 94%   BMI 25.69 kg/m          dextrose 5 % and 0.45% NaCl 10 mL/hr at 09/24/22 1451    I/O last 3 completed shifts: In: 1614.7 [P.O.:340; I.V.:1154.2; IV Piggyback:120.5] Out: 2605 [Urine:1475; Other:900; Blood:10; Chest Tube:220]      Latest Ref Rng & Units 09/24/2022    6:34 AM 09/23/2022    6:11 AM 09/16/2022    1:27 PM  CBC  WBC 4.0 - 10.5 K/uL 11.5  6.0  9.3   Hemoglobin 13.0 - 17.0 g/dL 10.1  10.6  10.4   Hematocrit 39.0 - 52.0 % 31.1  31.6  32.1   Platelets 150 - 400 K/uL 281  285  430        Latest Ref Rng & Units 09/24/2022    6:34 AM 09/23/2022    6:11 AM 09/16/2022    1:27 PM  BMP  Glucose 70 - 99 mg/dL 156  123  109   BUN 8 - 23 mg/dL '21  22  27   '$ Creatinine 0.61 - 1.24 mg/dL 2.53  2.69  2.59   Sodium 135 - 145 mmol/L 134  136  134   Potassium 3.5 - 5.1 mmol/L 3.5  3.5  3.2   Chloride 98 - 111 mmol/L 98  97  99   CO2 22 - 32 mmol/L '27  27  23   '$ Calcium 8.9 - 10.3 mg/dL 8.3  8.9  8.7     ABG    Component Value Date/Time   PHART 7.5 (H) 09/16/2022 1345   PCO2ART 33 09/16/2022 1345   PO2ART 111 (H) 09/16/2022 1345   HCO3 25.7 09/16/2022 1345   TCO2 28 12/03/2015 0804   O2SAT 99.9 09/16/2022 1345       Sigurd Pugh, MD 09/24/2022 4:59 PM

## 2022-09-25 ENCOUNTER — Inpatient Hospital Stay (HOSPITAL_COMMUNITY): Payer: Medicare Other

## 2022-09-25 DIAGNOSIS — Z9889 Other specified postprocedural states: Secondary | ICD-10-CM | POA: Diagnosis not present

## 2022-09-25 DIAGNOSIS — I480 Paroxysmal atrial fibrillation: Secondary | ICD-10-CM | POA: Diagnosis not present

## 2022-09-25 LAB — CBC
HCT: 30.5 % — ABNORMAL LOW (ref 39.0–52.0)
Hemoglobin: 9.8 g/dL — ABNORMAL LOW (ref 13.0–17.0)
MCH: 26.9 pg (ref 26.0–34.0)
MCHC: 32.1 g/dL (ref 30.0–36.0)
MCV: 83.8 fL (ref 80.0–100.0)
Platelets: 235 10*3/uL (ref 150–400)
RBC: 3.64 MIL/uL — ABNORMAL LOW (ref 4.22–5.81)
RDW: 14.9 % (ref 11.5–15.5)
WBC: 9.6 10*3/uL (ref 4.0–10.5)
nRBC: 0 % (ref 0.0–0.2)

## 2022-09-25 LAB — COMPREHENSIVE METABOLIC PANEL
ALT: <5 U/L (ref 0–44)
AST: 12 U/L — ABNORMAL LOW (ref 15–41)
Albumin: 2.5 g/dL — ABNORMAL LOW (ref 3.5–5.0)
Alkaline Phosphatase: 142 U/L — ABNORMAL HIGH (ref 38–126)
Anion gap: 9 (ref 5–15)
BUN: 27 mg/dL — ABNORMAL HIGH (ref 8–23)
CO2: 29 mmol/L (ref 22–32)
Calcium: 8.3 mg/dL — ABNORMAL LOW (ref 8.9–10.3)
Chloride: 97 mmol/L — ABNORMAL LOW (ref 98–111)
Creatinine, Ser: 2.53 mg/dL — ABNORMAL HIGH (ref 0.61–1.24)
GFR, Estimated: 25 mL/min — ABNORMAL LOW (ref >60)
Glucose, Bld: 112 mg/dL — ABNORMAL HIGH (ref 70–99)
Potassium: 3.3 mmol/L — ABNORMAL LOW (ref 3.5–5.1)
Sodium: 135 mmol/L (ref 135–145)
Total Bilirubin: 0.3 mg/dL (ref 0.3–1.2)
Total Protein: 5.6 g/dL — ABNORMAL LOW (ref 6.5–8.1)

## 2022-09-25 LAB — SURGICAL PATHOLOGY

## 2022-09-25 LAB — CYTOLOGY - NON PAP

## 2022-09-25 MED ORDER — AMLODIPINE BESYLATE 10 MG PO TABS
10.0000 mg | ORAL_TABLET | Freq: Every day | ORAL | Status: DC
  Administered 2022-09-25 – 2022-09-28 (×4): 10 mg via ORAL
  Filled 2022-09-25 (×4): qty 1

## 2022-09-25 MED ORDER — POTASSIUM CHLORIDE CRYS ER 20 MEQ PO TBCR
40.0000 meq | EXTENDED_RELEASE_TABLET | Freq: Once | ORAL | Status: AC
  Administered 2022-09-25: 40 meq via ORAL
  Filled 2022-09-25: qty 2

## 2022-09-25 MED ORDER — HYDRALAZINE HCL 20 MG/ML IJ SOLN
10.0000 mg | Freq: Four times a day (QID) | INTRAMUSCULAR | Status: DC | PRN
  Administered 2022-09-27: 10 mg via INTRAVENOUS
  Filled 2022-09-25: qty 1

## 2022-09-25 NOTE — Care Management Important Message (Signed)
Important Message  Patient Details  Name: James [name] MRN: [id number] Date of Birth: 01-Sep-1945   Medicare Important Message Given:  Yes     Orbie Pyo 09/25/2022, 2:38 PM

## 2022-09-25 NOTE — Progress Notes (Addendum)
TCTS DAILY ICU PROGRESS NOTE                   [address]            [place] [address]          [phone]   2 Days Post-Op Procedure(s) (LRB): SUBXYPHOID PERICARDIAL WINDOW (N/A) TRANSESOPHAGEAL ECHOCARDIOGRAM (N/A)  Total Length of Stay:  LOS: 2 days   Subjective: Patient on CPAP this am. He is not having much pain this am.  Objective: Vital signs in last 24 hours: Temp:  [97.5 F (36.4 C)-97.9 F (36.6 C)] 97.7 F (36.5 C) (03/01 0500) Pulse Rate:  [54-74] 60 (03/01 0400) Cardiac Rhythm: Sinus bradycardia (02/29 0800) Resp:  [12-22] 22 (03/01 0400) BP: (130-158)/(54-80) 158/72 (02/29 2200) SpO2:  [93 %-98 %] 96 % (03/01 0400)  Filed Weights   09/23/22 0543  Weight: 81.2 kg      Intake/Output from previous day: 02/29 0701 - 03/01 0700 In: 720 [I.V.:720] Out: 2215 [Urine:2125; Chest Tube:90]  Intake/Output this shift: No intake/output data recorded.  Current Meds: Scheduled Meds:  acetaminophen  1,000 mg Oral Q6H   Or   acetaminophen (TYLENOL) oral liquid 160 mg/5 mL  1,000 mg Oral Q6H   allopurinol  100 mg Oral BID   aspirin EC  81 mg Oral Daily   atorvastatin  40 mg Oral q1800   bisacodyl  10 mg Oral Daily   Chlorhexidine Gluconate Cloth  6 each Topical Daily   cloNIDine  0.3 mg Oral TID   donepezil  5 mg Oral QHS   doxazosin  4 mg Oral BID   ferrous sulfate  325 mg Oral BID WC   furosemide  80 mg Oral BID   metoprolol tartrate  12.5 mg Oral BID   pantoprazole  40 mg Oral Daily   senna-docusate  1 tablet Oral QHS   sodium chloride flush  10-40 mL Intracatheter Q12H   Continuous Infusions:  dextrose 5 % and 0.45% NaCl 10 mL/hr at 09/24/22 1800   PRN Meds:.morphine injection, ondansetron (ZOFRAN) IV, mouth rinse, oxyCODONE, sodium chloride flush, traMADol  General appearance: alert, cooperative, and no distress Heart: IRRR IRRR Lungs: clear to auscultation bilaterally Abdomen: Soft, non tender, bowel sounds  present Extremities: SCDs in place Wound: Clean and dry  Lab Results: CBC: Recent Labs    09/23/22 0611 09/24/22 0634  WBC 6.0 11.5*  HGB 10.6* 10.1*  HCT 31.6* 31.1*  PLT 285 281    BMET:  Recent Labs    09/23/22 0611 09/24/22 0634  NA 136 134*  K 3.5 3.5  CL 97* 98  CO2 27 27  GLUCOSE 123* 156*  BUN 22 21  CREATININE 2.69* 2.53*  CALCIUM 8.9 8.3*     CMET: Lab Results  Component Value Date   WBC 11.5 (H) 09/24/2022   HGB 10.1 (L) 09/24/2022   HCT 31.1 (L) 09/24/2022   PLT 281 09/24/2022   GLUCOSE 156 (H) 09/24/2022   CHOL 131 08/21/2022   TRIG 82 08/21/2022   HDL 68 08/21/2022   LDLCALC 47 08/21/2022   ALT 17 09/16/2022   AST 21 09/16/2022   NA 134 (L) 09/24/2022   K 3.5 09/24/2022   CL 98 09/24/2022   CREATININE 2.53 (H) 09/24/2022   BUN 21 09/24/2022   CO2 27 09/24/2022   TSH 0.813 08/21/2022   PSA 2.5 06/12/2014   INR 1.1 09/16/2022   HGBA1C 5.4 08/21/2022  PT/INR: No results for input(s): "LABPROT", "INR" in the last 72 hours. Radiology: No results found.   Assessment/Plan: S/P Procedure(s) (LRB): SUBXYPHOID PERICARDIAL WINDOW (N/A) TRANSESOPHAGEAL ECHOCARDIOGRAM (N/A) CV-A fib with CVR yesterday. A fib with CVR this am and is hypertensive. Pericardial drains with 90 cc for 24 hours (recorded but by my mark yesterday 60 cc).Pericardial tubes are to water seal.  CXR this am shows cardiomegaly and small left pleural effusion. Drains to remain today. Hope to remove 1 drain soon. Gram stain of pericardial fluid shows no organisms seen, culture shows no growth < 24 hours. Per cardiology evaluation yesterday, referral to a fib clinic at discharge. If he needs anticoagulation, recommend not starting for 4-5 days as had bloody like pericardial effusion. On Clonidine 0.3 mg tid, Lopressor 12.5 mg bid (with parameters). Will restart Amlodipine (he took [place] prior to surgery) to help with BP. Pulmonary-History of OSA and uses CPAP at night.  3.  Gently supplement potassium 4. History of CKD (stage IV)-creatinine this am remains 2.53. On Lasix 80 mg po bid (as taken prior to surgery).  Chronic Kidney Disease   Stage I     GFR >90  Stage II    GFR 60-89  Stage IIIA GFR 45-59  Stage IIIB GFR 30-44  Stage IV   GFR 15-29  Stage V    GFR  <15  Lab Results  Component Value Date   CREATININE 2.53 (H) 09/24/2022   Estimated Creatinine Clearance: 25.2 mL/min (A) (by C-G formula based on SCr of 2.53 mg/dL (H)).  5. Anemia-H and H this am stable at 9.8 and 30.5. On Ferrous sulfate bid 6. Transfer to [facility] when bed available  Donielle Liston Alba PA-C 09/25/2022 7:02 AM  Patient seen and examined, agree with above Keep drains in today- will dc both when output < 50 ml for 24 hours Continue diuresis Hypertension- see above  Revonda Standard. Roxan Hockey, MD [facility] [phone]

## 2022-09-25 NOTE — Progress Notes (Signed)
Pt already placed himself on BIPAP (dreamstation) QHS for the night when RT entered room.

## 2022-09-25 NOTE — Progress Notes (Signed)
Rounding Note    Patient Name: James [name] Date of Encounter: 09/25/2022  [place] Cardiologist: Minus Breeding, MD   Subjective   Doing well today and transferred out of ICU No chest pain, LE edema significantly improved  Inpatient Medications    Scheduled Meds:  acetaminophen  1,000 mg Oral Q6H   Or   acetaminophen (TYLENOL) oral liquid 160 mg/5 mL  1,000 mg Oral Q6H   allopurinol  100 mg Oral BID   amLODipine  10 mg Oral Daily   aspirin EC  81 mg Oral Daily   atorvastatin  40 mg Oral q1800   bisacodyl  10 mg Oral Daily   Chlorhexidine Gluconate Cloth  6 each Topical Daily   cloNIDine  0.3 mg Oral TID   donepezil  5 mg Oral QHS   doxazosin  4 mg Oral BID   ferrous sulfate  325 mg Oral BID WC   furosemide  80 mg Oral BID   metoprolol tartrate  12.5 mg Oral BID   pantoprazole  40 mg Oral Daily   senna-docusate  1 tablet Oral QHS   sodium chloride flush  10-40 mL Intracatheter Q12H   Continuous Infusions:  PRN Meds: hydrALAZINE, morphine injection, ondansetron (ZOFRAN) IV, mouth rinse, oxyCODONE, sodium chloride flush, traMADol   Vital Signs    Vitals:   09/25/22 0800 09/25/22 0802 09/25/22 0913 09/25/22 0914  BP: (!) 152/77 (!) 152/77 (!) 152/80 (!) 151/66  Pulse: 70 84  71  Resp: 15 (!) '22 17 16  '$ Temp:  (!) 97 F (36.1 C) 97.6 F (36.4 C)   TempSrc:  Oral Oral   SpO2: 96% 95%    Weight:      Height:        Intake/Output Summary (Last 24 hours) at 09/25/2022 1012 Last data filed at 09/25/2022 0915 Gross per 24 hour  Intake 207.48 ml  Output 2905 ml  Net -2697.52 ml      09/23/2022    5:43 AM 09/16/2022    1:06 PM 09/14/2022    2:55 PM  Last 3 Weights  Weight (lbs) 179 lb 0.2 oz 179 lb 180 lb  Weight (kg) 81.2 kg 81.194 kg 81.647 kg      Telemetry    Afib, rate controlled - Personally Reviewed  ECG    No new tracing today - Personally Reviewed  Physical Exam   GEN: No acute distress.   Neck: No JVD Cardiac:  Irregular, no murmurs Respiratory: Clear to auscultation bilaterally. GI: Soft, nontender, non-distended  MS: Trace-1+ edema, warm  Neuro:  Nonfocal  Psych: Normal affect   Labs    High Sensitivity Troponin:   Recent Labs  Lab 08/30/22 0921 08/30/22 1141  TROPONINIHS 8 9     Chemistry Recent Labs  Lab 09/23/22 0611 09/24/22 0634 09/25/22 0632  NA 136 134* 135  K 3.5 3.5 3.3*  CL 97* 98 97*  CO2 '27 27 29  '$ GLUCOSE 123* 156* 112*  BUN 22 21 27*  CREATININE 2.69* 2.53* 2.53*  CALCIUM 8.9 8.3* 8.3*  PROT  --   --  5.6*  ALBUMIN  --   --  2.5*  AST  --   --  12*  ALT  --   --  <5  ALKPHOS  --   --  142*  BILITOT  --   --  0.3  GFRNONAA 24* 25* 25*  ANIONGAP '12 9 9    '$ Lipids No results for input(s): "CHOL", "  TRIG", "HDL", "LABVLDL", "LDLCALC", "CHOLHDL" in the last 168 hours.  Hematology Recent Labs  Lab 09/23/22 [phone] 09/24/22 0634 09/25/22 0632  WBC 6.0 11.5* 9.6  RBC 3.81* 3.69* 3.64*  HGB 10.6* 10.1* 9.8*  HCT 31.6* 31.1* 30.5*  MCV 82.9 84.3 83.8  MCH 27.8 27.4 26.9  MCHC 33.5 32.5 32.1  RDW 14.8 14.9 14.9  PLT 285 281 235   Thyroid No results for input(s): "TSH", "FREET4" in the last 168 hours.  BNPNo results for input(s): "BNP", "PROBNP" in the last 168 hours.  DDimer No results for input(s): "DDIMER" in the last 168 hours.   Radiology    DG CHEST PORT 1 VIEW  Result Date: 09/25/2022 CLINICAL DATA:  Pericardial effusion EXAM: PORTABLE CHEST 1 VIEW COMPARISON:  Yesterday FINDINGS: Right internal jugular line tip at mid to low SVC. Numerous leads and wires project over the chest. Mediastinal drainage catheters are again identified. Midline trachea. Moderate cardiomegaly. Atherosclerosis in the transverse aorta. Similar small left pleural effusion. No pneumothorax. Suspect hyperinflation. Left-greater-than-right base airspace disease. IMPRESSION: No significant change since one day prior. Similar small left pleural effusion with bibasilar Airspace disease,  likely atelectasis. Cardiomegaly without congestive failure. Aortic Atherosclerosis (ICD10-I70.0). Electronically Signed   By: Abigail Miyamoto M.D.   On: 09/25/2022 08:15   DG Chest Port 1 View  Result Date: 09/24/2022 CLINICAL DATA:  Status post pericardial window creation EXAM: PORTABLE CHEST 1 VIEW COMPARISON:  Chest radiograph dated 09/15/2022 FINDINGS: Lines/tubes: Right internal jugular venous catheter tip projects over the superior cavoatrial junction. Similar mediastinal drainage catheters. Chest: Similar biapical pleural-parenchymal scarring. Persistent dense left retrocardiac opacity. Pleura: Similar small left pleural effusion. Trace right pleural effusion. No pneumothorax. Heart/mediastinum: Similar enlarged cardiomediastinal silhouette. Bones: No acute osseous abnormality. IMPRESSION: 1. Similar small left pleural effusion and trace right pleural effusion. 2. Persistent dense left retrocardiac opacity, likely atelectasis. 3. Cardiomegaly. Electronically Signed   By: Darrin Nipper M.D.   On: 09/24/2022 08:14    Cardiac Studies   09/11/22 TTE   IMPRESSIONS     1. Left ventricular ejection fraction, by estimation, is 55 to 60%. The  left ventricle has normal function. The left ventricle has no regional  wall motion abnormalities. There is mild concentric left ventricular  hypertrophy. Left ventricular diastolic  parameters are indeterminate.   2. Right ventricular systolic function is normal. The right ventricular  size is normal. Tricuspid regurgitation signal is inadequate for assessing  PA pressure.   3. Left atrial size was moderately dilated.   4. The mitral valve is normal in structure. No evidence of mitral valve  regurgitation. No evidence of mitral stenosis.   5. The aortic valve is tricuspid. There is mild calcification of the  aortic valve. Aortic valve regurgitation is not visualized. No aortic  stenosis is present.   6. The inferior vena cava is dilated in size with <50%  respiratory  variability, suggesting right atrial pressure of 15 mmHg.   7. 28% respirophasic variation of mitral E inflow velocity (difficult  with irregular rhythm, possible atrial fibrillation). Slight RV diastolic  indentation that looks worse in subcostal views and slight right atrial  indentation. The IVC is dilated. I  suspect this is at least early tamponade physiology. Large pericardial  effusion. The pericardial effusion is circumferential.   8. The patient appears to be in atrial fibrillation.   Patient Profile     78 y.o. male with a hx of hypertension, hyperlipidemia, pericardial effusion (s/p window), severe thoracic and  abdominal aortic atherosclerosis, renal artery stenosis, s/p bilateral carotid endarterectomy, and DVT who was admitted for a pericardial window for management of progression of large pericardial effusion with course complicated by new Afib for which Cardiology was consulted   Assessment & Plan    #Atrial Fibrillation: CHADs-vasc 4. Patient admitted for pericardial window in the setting of worsening chronic pericardial effusion. Post-op course complicated by new Afib. Currently, he is well rate controlled on metop 12.'5mg'$  BID. Plan to start apixaban for [facility] once cleared by surgery. Will arrange for follow-up in Afib clinic as well shortly after discharge for management going forward. If remains in Afib, could pursue DCCV after 3 weeks of AC. -Continue metop 12.'5mg'$  BID -Plan to start apixaban once cleared by CT surgery -Will arrange for follow-up with Afib clinic following discharge -If remains in Afib, can plan for DCCV after 3 weeks of AC -Will need to continue home CPAP as has frequent <3s pauses on tele while sleeping  #Pericardial Effusion: Patient admitted for subxiphoid window with ~1L non-clotting bloody fluid evacuated. -Post-op care per CT surgery  #HTN: Elevated today and home BP meds resumed. -Continue amlodipine '10mg'$  daily -Continue clonidine  0.'3mg'$  TID -Continue cardura '4mg'$  daily  #CKD IV: -Stable on labs; follows with nephrology as outpatient  #HLD: -Continue lipitor '40mg'$  daily   Cardiology will sign-off. Will arrange for outpatient follow-up with Afib clinic. Would start AC if remains in Afib once cleared by CT surgery as we can arrange for outpatient cardioversion after 3 weeks.  For questions or updates, please contact [place] Please consult [email or web address] for contact info under        Signed, Freada Bergeron, MD  09/25/2022, 10:12 AM

## 2022-09-26 ENCOUNTER — Inpatient Hospital Stay (HOSPITAL_COMMUNITY): Payer: Medicare Other

## 2022-09-26 LAB — BASIC METABOLIC PANEL
Anion gap: 11 (ref 5–15)
BUN: 34 mg/dL — ABNORMAL HIGH (ref 8–23)
CO2: 29 mmol/L (ref 22–32)
Calcium: 8.4 mg/dL — ABNORMAL LOW (ref 8.9–10.3)
Chloride: 97 mmol/L — ABNORMAL LOW (ref 98–111)
Creatinine, Ser: 2.76 mg/dL — ABNORMAL HIGH (ref 0.61–1.24)
GFR, Estimated: 23 mL/min — ABNORMAL LOW (ref >60)
Glucose, Bld: 126 mg/dL — ABNORMAL HIGH (ref 70–99)
Potassium: 4.2 mmol/L (ref 3.5–5.1)
Sodium: 137 mmol/L (ref 135–145)

## 2022-09-26 LAB — GLUCOSE, CAPILLARY: Glucose-Capillary: 131 mg/dL — ABNORMAL HIGH (ref 70–99)

## 2022-09-26 NOTE — Progress Notes (Addendum)
TCTS DAILY ICU PROGRESS NOTE                   [address]            [place] [address]          [phone]   3 Days Post-Op Procedure(s) (LRB): SUBXYPHOID PERICARDIAL WINDOW (N/A) TRANSESOPHAGEAL ECHOCARDIOGRAM (N/A)  Total Length of Stay:  LOS: 3 days   Subjective: Sitting up in bed.  Says he has mild soreness in his chest when he coughs.  Otherwise he feels okay.  Objective: Vital signs in last 24 hours: Temp:  [97.6 F (36.4 C)-98.2 F (36.8 C)] 98.2 F (36.8 C) (03/02 0740) Pulse Rate:  [52-88] 76 (03/02 0900) Cardiac Rhythm: Atrial fibrillation;Bundle branch block (03/02 0823) Resp:  [12-26] 17 (03/02 0900) BP: (138-185)/(61-91) 152/69 (03/02 0900) SpO2:  [95 %-98 %] 96 % (03/02 0900)  Filed Weights   09/23/22 0543  Weight: 81.2 kg      Intake/Output from previous day: 03/01 0701 - 03/02 0700 In: 300 [P.O.:300] Out: 2000 [Urine:1900; Chest Tube:100]  Intake/Output this shift: Total I/O In: 360 [P.O.:360] Out: 375 [Urine:375]  Current Meds: Scheduled Meds:  acetaminophen  1,000 mg Oral Q6H   Or   acetaminophen (TYLENOL) oral liquid 160 mg/5 mL  1,000 mg Oral Q6H   allopurinol  100 mg Oral BID   amLODipine  10 mg Oral Daily   aspirin EC  81 mg Oral Daily   atorvastatin  40 mg Oral q1800   bisacodyl  10 mg Oral Daily   Chlorhexidine Gluconate Cloth  6 each Topical Daily   cloNIDine  0.3 mg Oral TID   donepezil  5 mg Oral QHS   doxazosin  4 mg Oral BID   ferrous sulfate  325 mg Oral BID WC   furosemide  80 mg Oral BID   metoprolol tartrate  12.5 mg Oral BID   pantoprazole  40 mg Oral Daily   senna-docusate  1 tablet Oral QHS   sodium chloride flush  10-40 mL Intracatheter Q12H   Continuous Infusions:   PRN Meds:.hydrALAZINE, morphine injection, ondansetron (ZOFRAN) IV, mouth rinse, oxyCODONE, sodium chloride flush, traMADol  General appearance: alert, cooperative, and no distress Heart: IRRR.  Monitor showing atrial  fibrillation with heart rate currently in the 70s  Lungs: clear to auscultation bilaterally.  Normal work of breathing on room air Abdomen: Soft, non tender Extremities: Minimal peripheral edema Wound: Subxiphoid incision has expected bruising.  It is well-approximated with Dermabond on the skin.  The 2 mediastinal drains are secure.  Total drainage was 100 mL for the past 24 hours.  Lab Results: CBC: Recent Labs    09/24/22 0634 09/25/22 0632  WBC 11.5* 9.6  HGB 10.1* 9.8*  HCT 31.1* 30.5*  PLT 281 235    BMET:  Recent Labs    09/25/22 0632 09/26/22 0040  NA 135 137  K 3.3* 4.2  CL 97* 97*  CO2 29 29  GLUCOSE 112* 126*  BUN 27* 34*  CREATININE 2.53* 2.76*  CALCIUM 8.3* 8.4*     CMET: Lab Results  Component Value Date   WBC 9.6 09/25/2022   HGB 9.8 (L) 09/25/2022   HCT 30.5 (L) 09/25/2022   PLT 235 09/25/2022   GLUCOSE 126 (H) 09/26/2022   CHOL 131 08/21/2022   TRIG 82 08/21/2022   HDL 68 08/21/2022   LDLCALC 47 08/21/2022   ALT <5 09/25/2022   AST 12 (L)  09/25/2022   NA 137 09/26/2022   K 4.2 09/26/2022   CL 97 (L) 09/26/2022   CREATININE 2.76 (H) 09/26/2022   BUN 34 (H) 09/26/2022   CO2 29 09/26/2022   TSH 0.813 08/21/2022   PSA 2.5 06/12/2014   INR 1.1 09/16/2022   HGBA1C 5.4 08/21/2022     PT/INR: No results for input(s): "LABPROT", "INR" in the last 72 hours. Radiology: DG CHEST PORT 1 VIEW  Result Date: 09/26/2022 CLINICAL DATA:  Pericardial effusion. EXAM: PORTABLE CHEST 1 VIEW COMPARISON:  09/25/2022 FINDINGS: Lungs are hyperexpanded. The cardio pericardial silhouette is enlarged. Retrocardiac collapse/consolidation is similar to prior. Similar small left pleural effusion. Midline pericardial/mediastinal drains evident. IMPRESSION: No substantial interval change. Retrocardiac collapse/consolidation with small left pleural effusion. Electronically Signed   By: Misty Stanley M.D.   On: 09/26/2022 07:55     Assessment/Plan: S/P Procedure(s)  (LRB): SUBXYPHOID PERICARDIAL WINDOW (N/A) TRANSESOPHAGEAL ECHOCARDIOGRAM (N/A)  -POD3 Subxiphoid pericardial window-pericardial fluid cultures show no growth at 2 days.  Path on the pericardial tissue and fluid are both negative for malignancy.  MT's drained total of 16m for past 24 hours. Leaving tubes in place until less than 531m24 hours.    -A fib with CVR yesterday. A fib with controlled rate.  Plan, referral to a fib clinic at discharge per cardiology recommendations.  Would like to hold off on anticoagulation until the mediastinal tubes are out.  -HTN- On Clonidine 0.3 mg tid, Lopressor 12.5 mg bid (with parameters) and amlodipine with better control overall. Monitoring.   -Pulmonary-History of OSA and uses CPAP at night.   -RENAL- History of CKD (stage IV)-creatinine trending up slightly. On Lasix 80 mg po bid (as taken prior to surgery). Hypokalemia- corrected. Monitoring  -HEME-Anemia-H and H  stable. On iron replacement  MyAntony OdeaA-C 09/26/2022 9:35 AM   Chart reviewed, patient examined, agree with above. Chest tube output continues to decrease and may be able to remove tomorrow. [facility] since this was a very large bloody effusion.

## 2022-09-27 ENCOUNTER — Inpatient Hospital Stay (HOSPITAL_COMMUNITY): Payer: Medicare Other

## 2022-09-27 LAB — BASIC METABOLIC PANEL
Anion gap: 13 (ref 5–15)
BUN: 41 mg/dL — ABNORMAL HIGH (ref 8–23)
CO2: 27 mmol/L (ref 22–32)
Calcium: 8.4 mg/dL — ABNORMAL LOW (ref 8.9–10.3)
Chloride: 98 mmol/L (ref 98–111)
Creatinine, Ser: 2.91 mg/dL — ABNORMAL HIGH (ref 0.61–1.24)
GFR, Estimated: 22 mL/min — ABNORMAL LOW (ref >60)
Glucose, Bld: 128 mg/dL — ABNORMAL HIGH (ref 70–99)
Potassium: 4.3 mmol/L (ref 3.5–5.1)
Sodium: 138 mmol/L (ref 135–145)

## 2022-09-27 LAB — CBC
HCT: 30.6 % — ABNORMAL LOW (ref 39.0–52.0)
Hemoglobin: 10 g/dL — ABNORMAL LOW (ref 13.0–17.0)
MCH: 27.3 pg (ref 26.0–34.0)
MCHC: 32.7 g/dL (ref 30.0–36.0)
MCV: 83.6 fL (ref 80.0–100.0)
Platelets: 217 10*3/uL (ref 150–400)
RBC: 3.66 MIL/uL — ABNORMAL LOW (ref 4.22–5.81)
RDW: 14.9 % (ref 11.5–15.5)
WBC: 8.1 10*3/uL (ref 4.0–10.5)
nRBC: 0 % (ref 0.0–0.2)

## 2022-09-27 MED ORDER — METOPROLOL TARTRATE 25 MG PO TABS
25.0000 mg | ORAL_TABLET | Freq: Two times a day (BID) | ORAL | Status: DC
  Administered 2022-09-27 – 2022-09-28 (×2): 25 mg via ORAL
  Filled 2022-09-27 (×2): qty 1

## 2022-09-27 NOTE — Progress Notes (Addendum)
TCTS DAILY ICU PROGRESS NOTE                   [address]            [place] [address]          [phone]   4 Days Post-Op Procedure(s) (LRB): SUBXYPHOID PERICARDIAL WINDOW (N/A) TRANSESOPHAGEAL ECHOCARDIOGRAM (N/A)  Total Length of Stay:  LOS: 4 days   Subjective: Resting in bed.  Having similar soreness at the chest tubes as yesterday.  Otherwise he feels okay.  BM yesterday  Objective: Vital signs in last 24 hours: Temp:  [97.8 F (36.6 C)-98.1 F (36.7 C)] 98 F (36.7 C) (03/03 0715) Pulse Rate:  [57-82] 72 (03/03 0715) Cardiac Rhythm: Atrial fibrillation;Bundle branch block (03/03 0823) Resp:  [17-21] 17 (03/03 0715) BP: (124-170)/(68-116) 162/71 (03/03 0715) SpO2:  [94 %-97 %] 97 % (03/03 0715)  Filed Weights   09/23/22 0543  Weight: 81.2 kg      Intake/Output from previous day: 03/02 0701 - 03/03 0700 In: 720 [P.O.:720] Out: 1305 [Urine:1275; Chest Tube:30]  Intake/Output this shift: No intake/output data recorded.  Current Meds: Scheduled Meds:  acetaminophen  1,000 mg Oral Q6H   Or   acetaminophen (TYLENOL) oral liquid 160 mg/5 mL  1,000 mg Oral Q6H   allopurinol  100 mg Oral BID   amLODipine  10 mg Oral Daily   aspirin EC  81 mg Oral Daily   atorvastatin  40 mg Oral q1800   bisacodyl  10 mg Oral Daily   Chlorhexidine Gluconate Cloth  6 each Topical Daily   cloNIDine  0.3 mg Oral TID   donepezil  5 mg Oral QHS   doxazosin  4 mg Oral BID   ferrous sulfate  325 mg Oral BID WC   furosemide  80 mg Oral BID   metoprolol tartrate  12.5 mg Oral BID   pantoprazole  40 mg Oral Daily   senna-docusate  1 tablet Oral QHS   sodium chloride flush  10-40 mL Intracatheter Q12H   Continuous Infusions:   PRN Meds:.hydrALAZINE, morphine injection, ondansetron (ZOFRAN) IV, mouth rinse, oxyCODONE, sodium chloride flush, traMADol  General appearance: alert, cooperative, and no distress Heart: IRRR.  Monitor showing atrial fibrillation  with heart rate currently in the 70s  Lungs: clear to auscultation bilaterally.  Normal work of breathing on room air Abdomen: Soft, non tender Extremities: Minimal peripheral edema Wound: Subxiphoid incision has expected bruising.  It is well-approximated with Dermabond on the skin.  The 2 mediastinal drains are secure.  Total drainage was 30 mL for the past 24 hours.  Lab Results: CBC: Recent Labs    09/25/22 0632 09/27/22 0018  WBC 9.6 8.1  HGB 9.8* 10.0*  HCT 30.5* 30.6*  PLT 235 217    BMET:  Recent Labs    09/26/22 0040 09/27/22 0018  NA 137 138  K 4.2 4.3  CL 97* 98  CO2 29 27  GLUCOSE 126* 128*  BUN 34* 41*  CREATININE 2.76* 2.91*  CALCIUM 8.4* 8.4*     CMET: Lab Results  Component Value Date   WBC 8.1 09/27/2022   HGB 10.0 (L) 09/27/2022   HCT 30.6 (L) 09/27/2022   PLT 217 09/27/2022   GLUCOSE 128 (H) 09/27/2022   CHOL 131 08/21/2022   TRIG 82 08/21/2022   HDL 68 08/21/2022   LDLCALC 47 08/21/2022   ALT <5 09/25/2022   AST 12 (L) 09/25/2022   NA  138 09/27/2022   K 4.3 09/27/2022   CL 98 09/27/2022   CREATININE 2.91 (H) 09/27/2022   BUN 41 (H) 09/27/2022   CO2 27 09/27/2022   TSH 0.813 08/21/2022   PSA 2.5 06/12/2014   INR 1.1 09/16/2022   HGBA1C 5.4 08/21/2022     PT/INR: No results for input(s): "LABPROT", "INR" in the last 72 hours. Radiology: DG CHEST PORT 1 VIEW  Result Date: 09/27/2022 CLINICAL DATA:  Pericardial effusion EXAM: PORTABLE CHEST 1 VIEW COMPARISON:  Prior chest x-ray 09/26/2022 FINDINGS: Similar appearance of the chest. Pericardial drain in place. Stable enlargement of the cardiopericardial silhouette. Persistent left basilar airspace opacity. Biapical pleuroparenchymal scarring. Probable small bilateral layering pleural effusions. Atherosclerotic calcifications in the transverse aorta. Lungs are otherwise clear. No acute osseous abnormality. IMPRESSION: Stable chest x-ray. No significant interval change over the past 24 hours.  Probable small bilateral pleural effusions with left basilar atelectasis versus infiltrate. Unchanged position of pericardial drain. Electronically Signed   By: Jacqulynn Cadet M.D.   On: 09/27/2022 06:42     Assessment/Plan: S/P Procedure(s) (LRB): SUBXYPHOID PERICARDIAL WINDOW (N/A) TRANSESOPHAGEAL ECHOCARDIOGRAM (N/A)  -POD4 Subxiphoid pericardial window-pericardial fluid cultures show rare staph epidermidis, not likely a pathogen.   Path on the pericardial tissue and fluid are both negative for malignancy.  MT's drained total of 78m for past 24 hours. Will remove the tubes today.  -A fib with controlled rate.  Plan referral to a fib clinic at discharge per cardiology recommendations.  Avoiding anticoagulation since he had a large bloody pericardial effusion  -HTN- On Clonidine 0.3 mg tid, Lopressor 12.5 mg bid (with parameters) . BP 160-170. Will increase the metoprolol to '25mg'$  BID. Monitoring.   -Pulmonary-History of OSA and uses CPAP at night.   -RENAL- History of CKD (stage IV)-creatinine trending up slightly. Will hold the Lasix today. Hypokalemia- corrected. BMET in am.  -HEME-Anemia-H and H  stable. On iron replacement  MAntony OdeaPA-C 09/27/2022 10:27 AM    Chart reviewed, patient examined, agree with above. Pericardial tube output low. Will remove them today.

## 2022-09-28 ENCOUNTER — Other Ambulatory Visit (HOSPITAL_COMMUNITY): Payer: Self-pay

## 2022-09-28 ENCOUNTER — Inpatient Hospital Stay (HOSPITAL_COMMUNITY): Payer: Medicare Other

## 2022-09-28 LAB — CBC
HCT: 28.9 % — ABNORMAL LOW (ref 39.0–52.0)
Hemoglobin: 9.9 g/dL — ABNORMAL LOW (ref 13.0–17.0)
MCH: 27.6 pg (ref 26.0–34.0)
MCHC: 34.3 g/dL (ref 30.0–36.0)
MCV: 80.5 fL (ref 80.0–100.0)
Platelets: 201 10*3/uL (ref 150–400)
RBC: 3.59 MIL/uL — ABNORMAL LOW (ref 4.22–5.81)
RDW: 14.7 % (ref 11.5–15.5)
WBC: 7 10*3/uL (ref 4.0–10.5)
nRBC: 0 % (ref 0.0–0.2)

## 2022-09-28 LAB — BASIC METABOLIC PANEL
Anion gap: 11 (ref 5–15)
BUN: 44 mg/dL — ABNORMAL HIGH (ref 8–23)
CO2: 28 mmol/L (ref 22–32)
Calcium: 8.2 mg/dL — ABNORMAL LOW (ref 8.9–10.3)
Chloride: 94 mmol/L — ABNORMAL LOW (ref 98–111)
Creatinine, Ser: 2.6 mg/dL — ABNORMAL HIGH (ref 0.61–1.24)
GFR, Estimated: 25 mL/min — ABNORMAL LOW (ref >60)
Glucose, Bld: 140 mg/dL — ABNORMAL HIGH (ref 70–99)
Potassium: 3.2 mmol/L — ABNORMAL LOW (ref 3.5–5.1)
Sodium: 133 mmol/L — ABNORMAL LOW (ref 135–145)

## 2022-09-28 LAB — AEROBIC/ANAEROBIC CULTURE W GRAM STAIN (SURGICAL/DEEP WOUND)

## 2022-09-28 MED ORDER — POTASSIUM CHLORIDE CRYS ER 20 MEQ PO TBCR
20.0000 meq | EXTENDED_RELEASE_TABLET | Freq: Two times a day (BID) | ORAL | Status: DC
  Administered 2022-09-28: 20 meq via ORAL
  Filled 2022-09-28: qty 1

## 2022-09-28 MED ORDER — PREDNISONE 10 MG PO TABS: 10.0000 mg | ORAL_TABLET | Freq: Every day | ORAL | Status: DC

## 2022-09-28 MED ORDER — APIXABAN 5 MG PO TABS: 5.0000 mg | ORAL_TABLET | Freq: Two times a day (BID) | ORAL | 3 refills | Status: DC

## 2022-09-28 MED ORDER — ASPIRIN 81 MG PO TBEC: 81.0000 mg | DELAYED_RELEASE_TABLET | Freq: Every day | ORAL | 12 refills | Status: DC

## 2022-09-28 MED ORDER — ACETAMINOPHEN 500 MG PO TABS: 1000.0000 mg | ORAL_TABLET | Freq: Four times a day (QID) | ORAL | 0 refills | Status: DC | PRN

## 2022-09-28 MED ORDER — ALLOPURINOL 100 MG PO TABS: 100.0000 mg | ORAL_TABLET | Freq: Two times a day (BID) | ORAL | Status: DC

## 2022-09-28 MED ORDER — METOPROLOL TARTRATE 25 MG PO TABS: 25.0000 mg | ORAL_TABLET | Freq: Two times a day (BID) | ORAL | 1 refills | Status: DC

## 2022-09-28 MED ORDER — OXYCODONE HCL 5 MG PO TABS: 5.0000 mg | ORAL_TABLET | Freq: Four times a day (QID) | ORAL | 0 refills | Status: DC | PRN

## 2022-09-28 MED ORDER — PREDNISONE 10 MG PO TABS: 10.0000 mg | ORAL_TABLET | Freq: Every day | ORAL | 0 refills | Status: DC

## 2022-09-28 MED ORDER — APIXABAN 5 MG PO TABS
5.0000 mg | ORAL_TABLET | Freq: Two times a day (BID) | ORAL | Status: DC
  Administered 2022-09-28: 5 mg via ORAL
  Filled 2022-09-28: qty 1

## 2022-09-28 MED ORDER — AMLODIPINE-ATORVASTATIN 10-40 MG PO TABS: 1.0000 | ORAL_TABLET | Freq: Every evening | ORAL | Status: DC

## 2022-09-28 NOTE — Progress Notes (Signed)
Sutures removed per MD order.

## 2022-09-28 NOTE — Plan of Care (Signed)
  Problem: Education: Goal: Knowledge of General Education information will improve Description: Including pain rating scale, medication(s)/side effects and non-pharmacologic comfort measures 09/28/2022 1259 by Lucilla Lame, RN Outcome: Adequate for Discharge 09/28/2022 1259 by Lucilla Lame, RN Outcome: Adequate for Discharge   Problem: Health Behavior/Discharge Planning: Goal: Ability to manage health-related needs will improve 09/28/2022 1259 by Lucilla Lame, RN Outcome: Adequate for Discharge 09/28/2022 1259 by Lucilla Lame, RN Outcome: Adequate for Discharge   Problem: Clinical Measurements: Goal: Ability to maintain clinical measurements within normal limits will improve 09/28/2022 1259 by Shane Badeaux, Theresia Bough, RN Outcome: Adequate for Discharge 09/28/2022 1259 by Lucilla Lame, RN Outcome: Adequate for Discharge Goal: Will remain free from infection 09/28/2022 1259 by Lucilla Lame, RN Outcome: Adequate for Discharge 09/28/2022 1259 by Lucilla Lame, RN Outcome: Adequate for Discharge Goal: Diagnostic test results will improve 09/28/2022 1259 by Lucilla Lame, RN Outcome: Adequate for Discharge 09/28/2022 1259 by Lucilla Lame, RN Outcome: Adequate for Discharge Goal: Respiratory complications will improve 09/28/2022 1259 by Lucilla Lame, RN Outcome: Adequate for Discharge 09/28/2022 1259 by Lucilla Lame, RN Outcome: Adequate for Discharge Goal: Cardiovascular complication will be avoided 09/28/2022 1259 by Lucilla Lame, RN Outcome: Adequate for Discharge 09/28/2022 1259 by Lucilla Lame, RN Outcome: Adequate for Discharge   Problem: Activity: Goal: Risk for activity intolerance will decrease 09/28/2022 1259 by Lucilla Lame, RN Outcome: Adequate for Discharge 09/28/2022 1259 by Lucilla Lame, RN Outcome: Adequate for Discharge   Problem: Nutrition: Goal: Adequate nutrition will be maintained 09/28/2022 1259 by Lucilla Lame, RN Outcome: Adequate for  Discharge 09/28/2022 1259 by Lucilla Lame, RN Outcome: Adequate for Discharge   Problem: Coping: Goal: Level of anxiety will decrease 09/28/2022 1259 by Lucilla Lame, RN Outcome: Adequate for Discharge 09/28/2022 1259 by Lucilla Lame, RN Outcome: Adequate for Discharge   Problem: Elimination: Goal: Will not experience complications related to bowel motility 09/28/2022 1259 by Lucilla Lame, RN Outcome: Adequate for Discharge 09/28/2022 1259 by Lucilla Lame, RN Outcome: Adequate for Discharge Goal: Will not experience complications related to urinary retention 09/28/2022 1259 by Lucilla Lame, RN Outcome: Adequate for Discharge 09/28/2022 1259 by Lucilla Lame, RN Outcome: Adequate for Discharge   Problem: Pain Managment: Goal: General experience of comfort will improve 09/28/2022 1259 by Lucilla Lame, RN Outcome: Adequate for Discharge 09/28/2022 1259 by Lucilla Lame, RN Outcome: Adequate for Discharge   Problem: Safety: Goal: Ability to remain free from injury will improve 09/28/2022 1259 by Xiomara Sevillano, Theresia Bough, RN Outcome: Adequate for Discharge 09/28/2022 1259 by Lucilla Lame, RN Outcome: Adequate for Discharge   Problem: Skin Integrity: Goal: Risk for impaired skin integrity will decrease 09/28/2022 1259 by Lucilla Lame, RN Outcome: Adequate for Discharge 09/28/2022 1259 by Lucilla Lame, RN Outcome: Adequate for Discharge

## 2022-09-28 NOTE — Progress Notes (Signed)
Discharge order written. Instructions reviewed with the patient and his son - all questions answered. Patient to be taken to discharge lounge - son to arrive around noon. Son Alla Feeling) instructed to call unit if he has any questions related to patient's discharge instructions.

## 2022-09-28 NOTE — Progress Notes (Addendum)
      James [name]       [place] [address]             [phone]      5 Days Post-Op Procedure(s) (LRB): SUBXYPHOID PERICARDIAL WINDOW (N/A) TRANSESOPHAGEAL ECHOCARDIOGRAM (N/A) Subjective: Patient states he couldn't be doing any better and would like to go home today.   Objective: Vital signs in last 24 hours: Temp:  [97.4 F (36.3 C)-98.2 F (36.8 C)] 97.4 F (36.3 C) (03/04 0304) Pulse Rate:  [58-80] 61 (03/04 0304) Cardiac Rhythm: Atrial fibrillation (03/03 1914) Resp:  [13-21] 19 (03/04 0304) BP: (140-162)/(61-84) 148/61 (03/04 0304) SpO2:  [95 %-98 %] 98 % (03/04 0304)  Hemodynamic parameters for last 24 hours:    Intake/Output from previous day: 03/03 0701 - 03/04 0700 In: 240 [P.O.:240] Out: 350 [Urine:350] Intake/Output this shift: No intake/output data recorded.  General appearance: alert, cooperative, and no distress Neurologic: intact Heart: irregularly irregular rhythm, no murmur or rub Lungs: Slightly diminished breath sounds bibasilar Abdomen: soft, non-tender; bowel sounds normal; no masses,  no organomegaly Extremities: extremities normal, atraumatic, no cyanosis or edema Wound: Clean and dry dressing in place  Lab Results: Recent Labs    09/27/22 0018 09/28/22 0013  WBC 8.1 7.0  HGB 10.0* 9.9*  HCT 30.6* 28.9*  PLT 217 201   BMET:  Recent Labs    09/27/22 0018 09/28/22 0013  NA 138 133*  K 4.3 3.2*  CL 98 94*  CO2 27 28  GLUCOSE 128* 140*  BUN 41* 44*  CREATININE 2.91* 2.60*  CALCIUM 8.4* 8.2*    PT/INR: No results for input(s): "LABPROT", "INR" in the last 72 hours. ABG    Component Value Date/Time   PHART 7.5 (H) 09/16/2022 1345   HCO3 25.7 09/16/2022 1345   TCO2 28 12/03/2015 0804   O2SAT 99.9 09/16/2022 1345   CBG (last 3)  Recent Labs    09/26/22 2348  GLUCAP 131*    Assessment/Plan: S/P Procedure(s) (LRB): SUBXYPHOID PERICARDIAL WINDOW (N/A) TRANSESOPHAGEAL ECHOCARDIOGRAM (N/A)  Neuro:  Pain well controlled, improved since tubes have been removed.  CV: Atrial fibrillation with controlled rate, avoiding anticoagulation due to bloody pericardial effusion. Per cardiology will follow up at afib clinic. Afib, HR 60s this AM. SBP 148. On Norvasc '10mg'$  daily, Clonidine 0.'3mg'$  TID, Lopressor '25mg'$  BID. Pericardial tubes d/c'd yesterday. Pericardial fluid cultures showed rare staph epidermidis. Path on pericardial tissue and fluid were negative for malignancy.  Pulm: Saturating 98% on RA. Ambulating. CXR yesterday with small bilateral pleural effusions. Hx of OSA, uses CPAP at night.  GI: +BM, good appetite  Renal: Hx of CKD stage IV. Improved Cr 2.6 today from 2.91 yesterday. UO 350cc 2 shifts ago, no UO recorded last shift. Hypokalemia K 3.2, will supplement. Hyponatremia 133, diuresis held.  ID: WBC 7.0, afebrile  Expected postop ABLA: Stable H/H 9.9/28.9, on ferrous sulfate.  DVT prophylaxis: Lovenox held due to bloody effusion. Ambulation.   Dispo: Possibly d/c to home today vs tomorrow. Will discuss with Dr. Roxan Hockey.   LOS: 5 days    Magdalene River, PA-C 09/28/2022 Patient seen and examined, agree with above Path - chronic pericarditis, no malignancy Will start prednisone 10 mg daily Still in rate controlled atrial fib- will start Eliquis today Home today  Remo Lipps C. Roxan Hockey, MD [facility] [phone]

## 2022-09-28 NOTE — TOC Benefit Eligibility Note (Signed)
Patient Teacher, English as a foreign language completed.    The patient is currently admitted and upon discharge could be taking Eliquis 5 mg.  The current 30 day co-pay is $43.00.   The patient is insured through [employer] ([place] only [place] that takes [employer])   Lyndel Safe, [place] Patient [place] [facility] Patient Advocate Team Direct Number: [phone]  Fax: [phone]

## 2022-09-29 ENCOUNTER — Telehealth: Payer: Self-pay

## 2022-09-29 NOTE — Transitions of Care (Post Inpatient/ED Visit) (Signed)
   09/29/2022  Name: James [name] MRN: [id number] DOB: 07-17-1946  Today's TOC FU Call Status: Today's TOC FU Call Status:: Successful TOC FU Call Competed TOC FU Call Complete Date: 09/29/22  Transition Care Management Follow-up Telephone Call Date of Discharge: 09/28/22 Discharge [facility]: [facility]) Type of Discharge: Inpatient Admission Primary Inpatient Discharge Diagnosis:: Pericardial Effusion How have you been since you were released from the hospital?: Better Any questions or concerns?: No  Items Reviewed: Did you receive and understand the discharge instructions provided?: Yes Medications obtained and verified?: Yes (Medications Reviewed) Any new allergies since your discharge?: No Dietary orders reviewed?: NA Do you have support at home?: Yes People in Home: spouse Name of Support/Comfort Primary Source: [place] and Equipment/Supplies: [place] Ordered?: No Any new equipment or medical supplies ordered?: No  Functional Questionnaire: Do you need assistance with bathing/showering or dressing?: Yes Do you need assistance with meal preparation?: Yes Do you need assistance with eating?: Yes Do you have difficulty maintaining continence: Yes Do you need assistance with getting out of bed/getting out of a chair/moving?: Yes Do you have difficulty managing or taking your medications?: No  Folllow up appointments reviewed: PCP Follow-up appointment confirmed?: Yes Date of PCP follow-up appointment?: 10/06/22 Follow-up Provider: Roderic Palau, NP Specialist Hospital Follow-up appointment confirmed?: Yes Date of Specialist follow-up appointment?: 10/13/22 Follow-Up Specialty Provider:: Dr. Roxan Hockey Do you need transportation to your follow-up appointment?: No Do you understand care options if your condition(s) worsen?: Yes-patient verbalized understanding  SDOH Interventions Today    Flowsheet Row Most Recent Value  SDOH  Interventions   Food Insecurity Interventions Intervention Not Indicated  Housing Interventions Intervention Not Indicated  Transportation Interventions Intervention Not Indicated  Financial Strain Interventions Intervention Not Indicated      Johnney Killian, RN, BSN, CCM Care Management Coordinator [facility] Phone: [phone]: [phone]

## 2022-10-01 ENCOUNTER — Telehealth: Payer: Self-pay | Admitting: Family Medicine

## 2022-10-01 ENCOUNTER — Telehealth: Payer: Self-pay

## 2022-10-01 NOTE — Telephone Encounter (Signed)
Fluid drained from around heart.  Drains have been removed.  Yesterday, site opened and started bleeding. Using non stick gauze to site. Soaking through bandage. Changing every few hours.  Goes back for post op check on 3/12.  [phone]- called son and made aware and agreed to plan.

## 2022-10-01 NOTE — Telephone Encounter (Signed)
Patient's son, James [name] contacted the office concerned about patient's chest tube site bleeding, states that because he is on Eliquis, the bleeding has not stopped. States that he has to change the bandage every couple hours. Wants to know if he can go to his PCP, whom he lives near and have it "put back together". He states that the scab was removed accidentally and it is open about an 1/8 of an inch. Advised that it is best to let the opening heal from the inside-out and to apply a pressure dressing to the site and it should stop bleeding. Advised to give the office a call back if this does not work for further direction. He acknowledged receipt.

## 2022-10-05 ENCOUNTER — Other Ambulatory Visit: Payer: Self-pay | Admitting: Physician Assistant

## 2022-10-06 ENCOUNTER — Ambulatory Visit (HOSPITAL_COMMUNITY)
Admit: 2022-10-06 | Discharge: 2022-10-06 | Disposition: A | Discharge status: Home / Self Care | Payer: Medicare Other | Attending: Nurse Practitioner | Admitting: Nurse Practitioner

## 2022-10-06 ENCOUNTER — Encounter (HOSPITAL_COMMUNITY): Payer: Self-pay | Admitting: Nurse Practitioner

## 2022-10-06 VITALS — BP 176/66 | HR 84 | Ht 70.0 in | Wt 170.4 lb

## 2022-10-06 DIAGNOSIS — Z7901 Long term (current) use of anticoagulants: Secondary | ICD-10-CM | POA: Diagnosis not present

## 2022-10-06 DIAGNOSIS — I4891 Unspecified atrial fibrillation: Secondary | ICD-10-CM | POA: Diagnosis not present

## 2022-10-06 DIAGNOSIS — G4733 Obstructive sleep apnea (adult) (pediatric): Secondary | ICD-10-CM | POA: Diagnosis not present

## 2022-10-06 DIAGNOSIS — I4819 Other persistent atrial fibrillation: Secondary | ICD-10-CM

## 2022-10-06 NOTE — Progress Notes (Signed)
Primary Care Physician: [name], [name], MD Referring Physician: [facility] admit f/u  CV surgeon- Dr. Roxan Hockey  Cardiologist: Dr. Shelby Mattocks [name] with a h/o  numerous medical problems including a pericardial effusion, severe thoracic and abdominal aortic atherosclerosis, renal artery stenosis, bilateral carotid endarterectomy, DVT, hypertension, hyperlipidemia, ulcers, reflux, arthritis, and gout.   He has had a longstanding pericardial effusion.  He has chronic peripheral edema.  Recently was in the emergency room on 08/30/2021 with gastrointestinal complaints.  As part of his workup he had a CT of the chest abdomen and pelvis.  It showed a large pericardial effusion.  He left AMA at that time.  In the meantime he has become progressively more short of breath.  Now even with minimal activities.  Also has had worsening of his peripheral edema.  Repeat echocardiogram showed an increase in size of the pericardial effusion with some early signs of tamponade.  He was evaluated by Dr. Roxan Hockey who recommended sub-xiphoid pericardial window creation.  The risks and benefits of the procedure were explained to the patient and he was agreeable to proceed.  He was taken to the OP 09/23/22 and underwent  Sub-xiphoid pericardial window with removal of 1000 ml of bloody serous drainage.  He tolerated the procedure without difficulty and was taken to the PACU in stable condition. He went into a fib with a controlled ventricular rate, bradycardic at times. Cardiology was consulted. Lopressor was decreased to 12.5 mg bid. He will be referred to the a fib clinic at discharge. Because the pericardial drainage was hemorrhagic, it is recommended that anticoagulation not be started for 4-5 days from the day of surgery. He was hypertensive on 03/01 so Amlodipine was restarted (he took Caduet prior to surgery). He has a history of CKD (stage IV) and was on Lasix 80 mg po bid prior to surgery.  Creatinine was monitored and remained stable. He did have anemia (has a history) and he was restarted on oral Ferrous sulfate bid. He has a history of OSA and used CPAP at night. He was surgically stable for transfer from the ICU to [facility] on 02/29 and a bed was available on 03/01. Pericardial drains output gradually decreased and daily chest x rays remained stable. All drains were removed by 03/03.  The patient will be treated with a 30 day course of steroids.  He has been resumed on home Eliquis.  He is ambulating without difficulty. His surgical incisions are free from signs of infection.  He is medically stable for discharge home today.  In the afib clinic today, he continues in rate controlled afib. He is now on eliquis  5 mg bid (creatinine  2.60 but less than 42 yo and weight is over 60 kg) since 09/29/22. He is also on 30 days of prednisone. Discussed a cardioversion in 4-6 weeks time after he has satisfied the 3 week uninterrupted DOAC requirement and  has finished steroids. He states he feels tired but he has felt tired for a long time so he cannot discern if  he feels different in afib. He is being compliant with eliquis and metoprolol.    Today, he denies symptoms of palpitations, chest pain, shortness of breath, orthopnea, PND, lower extremity edema, dizziness, presyncope, syncope, or neurologic sequela. The patient is tolerating medications without difficulties and is otherwise without complaint today.   Past Medical History:  Diagnosis Date   Arthritis    Carotid artery occlusion  Chronic kidney disease    DVT (deep venous thrombosis) ([place]) 2008   GERD (gastroesophageal reflux disease)    takes Protonix daily   Gout    takes Allopurinol daily and Indomethacin prn   H/O renal artery stenosis    H/O: GI bleed    History of blood transfusion    History of colon polyps    History of gastric ulcer    Hyperlipidemia    takes Lipitor daily   Hypertension    takes Metoprolol and Amlodipine  daily   Joint pain    Joint swelling    Sleep apnea    Past Surgical History:  Procedure Laterality Date   CAROTID ENDARTERECTOMY  2008   right CEA   CHOLECYSTECTOMY  mid 90's   COLON SURGERY     COLONOSCOPY     ENDARTERECTOMY  06/02/2012   Procedure: ENDARTERECTOMY CAROTID;  Surgeon: Serafina Mitchell, MD;  Location: [facility];  Service: Vascular;  Laterality: Left;   ESOPHAGOGASTRODUODENOSCOPY     HERNIA REPAIR     umbilical hernia   PATCH ANGIOPLASTY  06/02/2012   Procedure: PATCH ANGIOPLASTY;  Surgeon: Serafina Mitchell, MD;  Location: [facility];  Service: Vascular;  Laterality: Left;  using Vascu-Guard Patch   PERIPHERAL VASCULAR CATHETERIZATION Right 12/03/2015   Procedure: Peripheral Vascular Balloon Angioplasty;  Surgeon: Serafina Mitchell, MD;  Location: [facility];  Service: Cardiovascular;  Laterality: Right;  RENAL unsuccessful   RENAL ARTERY STENT  2009   Right renal artery stenting-  Right Kidney   small intestine removed  2001   d/t random gi bleeding   SUBXYPHOID PERICARDIAL WINDOW N/A 09/23/2022   Procedure: SUBXYPHOID PERICARDIAL WINDOW;  Surgeon: Melrose Nakayama, MD;  Location: [facility];  Service: Thoracic;  Laterality: N/A;   TEE WITHOUT CARDIOVERSION N/A 09/23/2022   Procedure: TRANSESOPHAGEAL ECHOCARDIOGRAM;  Surgeon: Melrose Nakayama, MD;  Location: [facility];  Service: Thoracic;  Laterality: N/A;    Current Outpatient Medications  Medication Sig Dispense Refill   allopurinol (ZYLOPRIM) 100 MG tablet Take 1 tablet (100 mg total) by mouth 2 (two) times daily.     amLODipine-atorvastatin (CADUET) 10-40 MG tablet Take 1 tablet by mouth every evening.     apixaban (ELIQUIS) 5 MG TABS tablet Take 1 tablet (5 mg total) by mouth 2 (two) times daily. 60 tablet 3   aspirin EC 81 MG tablet Take 1 tablet (81 mg total) by mouth daily. Swallow whole. 30 tablet 12   Cholecalciferol (VITAMIN D3) 50 MCG (2000 UT) TABS Take 2,000 Units by mouth daily.     cloNIDine (CATAPRES) 0.3  MG tablet Take 0.3 mg by mouth 3 (three) times daily.     donepezil (ARICEPT) 5 MG tablet Take 1 tablet (5 mg total) by mouth at bedtime. 90 tablet 3   doxazosin (CARDURA) 2 MG tablet Take 4 mg by mouth 2 (two) times daily.     ferrous sulfate 325 (65 FE) MG tablet Take 325 mg by mouth in the morning and at bedtime.     furosemide (LASIX) 80 MG tablet Take 80 mg by mouth 2 (two) times daily.     metoprolol tartrate (LOPRESSOR) 25 MG tablet Take 1 tablet (25 mg total) by mouth 2 (two) times daily. 30 tablet 1   potassium chloride (MICRO-K) 10 MEQ CR capsule Take 10 mEq by mouth daily.     predniSONE (DELTASONE) 10 MG tablet Take 1 tablet (10 mg total) by mouth daily before breakfast.  30 tablet 0   No current facility-administered medications for this encounter.    Allergies  Allergen Reactions   Plavix [Clopidogrel Bisulfate] Other (See Comments)    GI Bleed   Uloric [Febuxostat] Other (See Comments)    Unknown reaction    Social History   Socioeconomic History   Marital status: Married    Spouse name: Melrose   Number of children: 2   Years of education: 14   Highest education level: Associate degree: occupational, Hotel manager, or vocational program  Occupational History   Occupation: Retired  Tobacco Use   Smoking status: Former    Packs/day: 1.50    Years: 36.00    Total pack years: 54.00    Types: Cigarettes    Quit date: 12/26/1999    Years since quitting: 22.7   Smokeless tobacco: Never  Vaping Use   Vaping Use: Never used  Substance and Sexual Activity   Alcohol use: No    Comment: Quit in 1998   Drug use: No   Sexual activity: Yes    Birth control/protection: None  Other Topics Concern   Not on file  Social History Narrative   Lives with wife.     Social Determinants of Health   Financial Resource Strain: Low Risk  (09/29/2022)   Overall Financial Resource Strain (CARDIA)    Difficulty of Paying Living Expenses: Not very hard  Food Insecurity: No Food  Insecurity (09/29/2022)   Hunger Vital Sign    Worried About Running Out of Food in the Last Year: Never true    Ran Out of Food in the Last Year: Never true  Transportation Needs: No Transportation Needs (09/29/2022)   PRAPARE - Hydrologist (Medical): No    Lack of Transportation (Non-Medical): No  Physical Activity: Inactive (09/21/2019)   Exercise Vital Sign    Days of Exercise per Week: 0 days    Minutes of Exercise per Session: 0 min  Stress: No Stress Concern Present (09/21/2019)   [place]    Feeling of Stress : Not at all  Social Connections: [place] (09/21/2019)   Social Connection and Isolation Panel [NHANES]    Frequency of Communication with Friends and Family: More than three times a week    Frequency of Social Gatherings with Friends and Family: More than three times a week    Attends Religious Services: More than 4 times per year    Active Member of [employer] or Organizations: Yes    Attends Music therapist: More than 4 times per year    Marital Status: Married  Human resources officer Violence: Not At Risk (09/23/2022)   Humiliation, Afraid, Rape, and Kick questionnaire    Fear of Current or Ex-Partner: No    Emotionally Abused: No    Physically Abused: No    Sexually Abused: No    Family History  Problem Relation Age of Onset   Hypertension Mother    Hyperlipidemia Mother    Other Mother        varicose veins   Hyperlipidemia Father    Hypertension Father    Diabetes Father    Clotting disorder Father    Deep vein thrombosis Father    Hypertension Sister    Diabetes Sister    Hyperlipidemia Sister    Varicose Veins Sister    Other Sister        Bleeding problems   Hypertension Brother  Hyperlipidemia Brother     ROS- All systems are reviewed and negative except as per the HPI above  Physical Exam: Vitals:   10/06/22 1336  Weight: 77.3 kg   Height: '5\' 10"'$  (1.778 m)   Wt Readings from Last 3 Encounters:  10/06/22 77.3 kg  09/23/22 81.2 kg  09/16/22 81.2 kg    Labs: Lab Results  Component Value Date   NA 133 (L) 09/28/2022   K 3.2 (L) 09/28/2022   CL 94 (L) 09/28/2022   CO2 28 09/28/2022   GLUCOSE 140 (H) 09/28/2022   BUN 44 (H) 09/28/2022   CREATININE 2.60 (H) 09/28/2022   CALCIUM 8.2 (L) 09/28/2022   MG 2.0 08/30/2022   Lab Results  Component Value Date   INR 1.1 09/16/2022   Lab Results  Component Value Date   CHOL 131 08/21/2022   HDL 68 08/21/2022   LDLCALC 47 08/21/2022   TRIG 82 08/21/2022     GEN- The patient is well appearing, alert and oriented x 3 today.   Head- normocephalic, atraumatic Eyes-  Sclera clear, conjunctiva pink Ears- hearing intact Oropharynx- clear Neck- supple, no JVP Lymph- no cervical lymphadenopathy Lungs- Clear to ausculation bilaterally, normal work of breathing Heart- irregular rate and rhythm, no murmurs, rubs or gallops, PMI not laterally displaced GI- soft, NT, ND, + BS Extremities- no clubbing, cyanosis, or edema MS- no significant deformity or atrophy Skin- no rash or lesion Psych- euthymic mood, full affect Neuro- strength and sensation are intact  EKG-Vent. rate 84 BPM PR interval * ms QRS duration 98 ms QT/QTcB 416/491 ms P-R-T axes * 100 73 Atrial fibrillation Rightward axis Possible Anterior infarct , age undetermined Abnormal ECG When compared with ECG of 23-Sep-2022 11:20, PREVIOUS ECG IS PRESENT    Assessment and Plan:  1. New onset afib Is rate controlled  Found after cardiac surgery  General education re afib and triggers  Continue metoprolol tartrate 25 mg bid  We discussed possible need for DCCV if afib persists, I would also like for him  to have finished prednisone before DCCV  2. CHA2DS2VASc  score of at least 4 Continue eliquis 5 mg bid  Reminded of precautions and not to miss does if possibility of pending cardioversion    3. OSA Continue cpap  4. S/p Sub-xiphoid pericardial window with removal of 1000 ml of bloody serous drainage., 09/23/22 F/u with Dr. Roxan Hockey as scheduled   F/u in afib clinic end of March for further review   Geroge Baseman. Venba Zenner, [facility] [address] [place], [place] [address] [phone]

## 2022-10-12 ENCOUNTER — Other Ambulatory Visit: Payer: Self-pay | Admitting: Thoracic Surgery (Cardiothoracic Vascular Surgery)

## 2022-10-12 DIAGNOSIS — I3139 Other pericardial effusion (noninflammatory): Secondary | ICD-10-CM

## 2022-10-13 ENCOUNTER — Ambulatory Visit (INDEPENDENT_AMBULATORY_CARE_PROVIDER_SITE_OTHER): Payer: Self-pay | Admitting: Thoracic Surgery (Cardiothoracic Vascular Surgery)

## 2022-10-13 ENCOUNTER — Ambulatory Visit
Admission: RE | Admit: 2022-10-13 | Discharge: 2022-10-13 | Disposition: A | Discharge status: Home / Self Care | Payer: Medicare Other | Source: Ambulatory Visit | Attending: Thoracic Surgery (Cardiothoracic Vascular Surgery) | Admitting: Thoracic Surgery (Cardiothoracic Vascular Surgery)

## 2022-10-13 VITALS — BP 150/65 | HR 90 | Resp 20 | Ht 70.0 in | Wt 165.0 lb

## 2022-10-13 DIAGNOSIS — I3139 Other pericardial effusion (noninflammatory): Secondary | ICD-10-CM

## 2022-10-13 DIAGNOSIS — R5383 Other fatigue: Secondary | ICD-10-CM | POA: Diagnosis not present

## 2022-10-13 DIAGNOSIS — Z09 Encounter for follow-up examination after completed treatment for conditions other than malignant neoplasm: Secondary | ICD-10-CM

## 2022-10-13 NOTE — Progress Notes (Signed)
[address]       ,[place] [address]             [phone]     HPI: James [name] returns for follow-up after recent pericardial window.  James [name] is a 77 year old man with history of a chronic pericardial effusion, severe thoracic and abdominal aortic atherosclerosis, renal artery stenosis, bilateral carotid endarterectomy, DVT, hypertension, hyperlipidemia, ulcers, reflux, arthritis, venous insufficiency, and gout.  He had a longstanding pericardial effusion with chronic peripheral edema.  In February he was having some GI complaints and a CT was done.  It showed a large pericardial effusion but he left AMA.  He had a an echocardiogram which showed an increase in size of the pericardial effusion with some early signs of tamponade.  He was referred for surgical drainage.  I did a subxiphoid pericardial window on 09/23/2022.  Postoperatively he had atrial fibrillation.  He was rate controlled.  He went home on Eliquis.  Also went home on a 30-day prescription for prednisone.  Pathology showed chronic pericarditis.  He is not having much pain.  Still has swelling in his legs.  Still gets short of breath with activity although his son says it is significantly better than it was preoperatively.  Biggest complaint is difficulty voiding.  Past Medical History:  Diagnosis Date   Arthritis    Carotid artery occlusion    Chronic kidney disease    DVT (deep venous thrombosis) ([place]) 2008   GERD (gastroesophageal reflux disease)    takes Protonix daily   Gout    takes Allopurinol daily and Indomethacin prn   H/O renal artery stenosis    H/O: GI bleed    History of blood transfusion    History of colon polyps    History of gastric ulcer    Hyperlipidemia    takes Lipitor daily   Hypertension    takes Metoprolol and Amlodipine daily   Joint pain    Joint swelling    Sleep apnea     Current Outpatient Medications  Medication Sig Dispense Refill    allopurinol (ZYLOPRIM) 100 MG tablet Take 1 tablet (100 mg total) by mouth 2 (two) times daily.     amLODipine-atorvastatin (CADUET) 10-40 MG tablet Take 1 tablet by mouth every evening.     apixaban (ELIQUIS) 5 MG TABS tablet Take 1 tablet (5 mg total) by mouth 2 (two) times daily. 60 tablet 3   aspirin EC 81 MG tablet Take 1 tablet (81 mg total) by mouth daily. Swallow whole. 30 tablet 12   Cholecalciferol (VITAMIN D3) 50 MCG (2000 UT) TABS Take 2,000 Units by mouth daily.     cloNIDine (CATAPRES) 0.3 MG tablet Take 0.3 mg by mouth 3 (three) times daily.     donepezil (ARICEPT) 5 MG tablet Take 1 tablet (5 mg total) by mouth at bedtime. 90 tablet 3   doxazosin (CARDURA) 2 MG tablet Take 4 mg by mouth 2 (two) times daily.     ferrous sulfate 325 (65 FE) MG tablet Take 325 mg by mouth in the morning and at bedtime.     furosemide (LASIX) 80 MG tablet Take 80 mg by mouth 2 (two) times daily.     metoprolol tartrate (LOPRESSOR) 25 MG tablet Take 1 tablet (25 mg total) by mouth 2 (two) times daily. 30 tablet 1   potassium chloride (MICRO-K) 10 MEQ CR capsule Take 10 mEq by mouth daily.     predniSONE (  DELTASONE) 10 MG tablet Take 1 tablet (10 mg total) by mouth daily before breakfast. 30 tablet 0   No current facility-administered medications for this visit.    Physical Exam BP (!) 150/65   Pulse 90   Resp 20   Ht 5\' 10"  (1.778 m)   Wt 165 lb (74.8 kg)   SpO2 95% Comment: RA  BMI 23.81 kg/m  77 year old man in no acute distress Alert and oriented x 3 with no focal deficits Lungs clear with equal breath sounds bilaterally Cardiac irregularly irregular Wound clean dry and intact 3+ edema both lower extremities  Diagnostic Tests: CHEST - 2 VIEW   COMPARISON:  Cardiomediastinal silhouette   FINDINGS: Enlarged cardiopericardial silhouette, unchanged. Aortic atherosclerosis. Biapical pleuroparenchymal scarring. Previously demonstrated atelectasis and/or airspace consolidation within  the left lung base is no longer appreciated. Small bilateral pleural effusions. Subtle ill-defined opacity within the lateral right lung base, new from the prior exam. No evidence of pneumothorax. No acute osseous abnormality identified.   IMPRESSION: 1. Enlarged cardiopericardial silhouette, unchanged. 2. Small bilateral pleural effusions. 3. Subtle ill-defined opacity within the lateral right lung base, new from the prior exam. The appearance favors atelectasis. 4. Previously demonstrated atelectasis and/or airspace consolidation within the left lung base is no longer appreciated. 5. Aortic Atherosclerosis (ICD10-I70.0).     Electronically Signed   By: Kellie Simmering D.O.   On: 10/13/2022 16:13   I personally reviewed the chest x-ray images.  Minimal bilateral effusions improved from prior.  Cardiac silhouette markedly improved from preoperative x-rays.  Impression: James [name] is a 77 year old man with history of a chronic pericardial effusion, severe thoracic and abdominal aortic atherosclerosis, renal artery stenosis, bilateral carotid endarterectomy, DVT, hypertension, hyperlipidemia, ulcers, reflux, arthritis, venous insufficiency, and gout.  He presented with an enlarging pericardial effusion with early signs of tamponade.  He underwent a subxiphoid pericardial window on 09/23/2022.  Good radiographic result post drainage.  He remains on prednisone for chronic pericarditis.  Will complete a 1 month course of that on March 28.  He is send do understand there is a possibility of recurrence.  Postoperative atrial fibrillation-on Eliquis.  Being followed by the atrial fibrillation clinic.  Consideration for cardioversion once off prednisone.  Voiding difficulty-recommended he contact Dr. Junious Silk for evaluation.  Plan: Follow-up as scheduled with Dr. Percival Spanish Follow-up with Dr. Junious Silk regarding voiding difficulty.  I will be happy to see him anytime if I can be of any  further assistance with his care.  Melrose Nakayama, MD [facility] [phone]

## 2022-10-22 ENCOUNTER — Ambulatory Visit (HOSPITAL_COMMUNITY)
Admission: RE | Admit: 2022-10-22 | Discharge: 2022-10-22 | Disposition: A | Discharge status: Home / Self Care | Payer: Medicare Other | Source: Ambulatory Visit | Attending: Physician Assistant | Admitting: Physician Assistant

## 2022-10-22 VITALS — BP 180/84 | HR 78 | Ht 70.0 in | Wt 166.4 lb

## 2022-10-22 DIAGNOSIS — I4819 Other persistent atrial fibrillation: Secondary | ICD-10-CM | POA: Insufficient documentation

## 2022-10-22 DIAGNOSIS — D6869 Other thrombophilia: Secondary | ICD-10-CM

## 2022-10-22 DIAGNOSIS — Z7901 Long term (current) use of anticoagulants: Secondary | ICD-10-CM | POA: Diagnosis not present

## 2022-10-22 DIAGNOSIS — G4733 Obstructive sleep apnea (adult) (pediatric): Secondary | ICD-10-CM | POA: Diagnosis not present

## 2022-10-22 NOTE — H&P (View-Only) (Signed)
 Primary Care Physician: [name], Joshua A, MD Referring Physician: [facility] admit f/u  CV surgeon- Dr. Hendrickson  Cardiologist: Dr. Hochrein   James [name] is a 77 y.o. male with a h/o  numerous medical problems including a pericardial effusion, severe thoracic and abdominal aortic atherosclerosis, renal artery stenosis, bilateral carotid endarterectomy, DVT, hypertension, hyperlipidemia, ulcers, reflux, arthritis, and gout.   He has had a longstanding pericardial effusion.  He has chronic peripheral edema.  Recently was in the emergency room on 08/30/2021 with gastrointestinal complaints.  As part of his workup he had a CT of the chest abdomen and pelvis.  It showed a large pericardial effusion.  He left AMA at that time.  In the meantime he has become progressively more short of breath.  Now even with minimal activities.  Also has had worsening of his peripheral edema.  Repeat echocardiogram showed an increase in size of the pericardial effusion with some early signs of tamponade.  He was evaluated by Dr. Hendrickson who recommended sub-xiphoid pericardial window creation.  The risks and benefits of the procedure were explained to the patient and he was agreeable to proceed.  He was taken to the OP 09/23/22 and underwent  Sub-xiphoid pericardial window with removal of 1000 ml of bloody serous drainage.  He tolerated the procedure without difficulty and was taken to the PACU in stable condition. He went into a fib with a controlled ventricular rate, bradycardic at times. Cardiology was consulted. Lopressor was decreased to 12.5 mg bid. He will be referred to the a fib clinic at discharge. Because the pericardial drainage was hemorrhagic, it is recommended that anticoagulation not be started for 4-5 days from the day of surgery. He was hypertensive on 03/01 so Amlodipine was restarted (he took Caduet prior to surgery). He has a history of CKD (stage IV) and was on Lasix 80 mg po bid prior to surgery.  Creatinine was monitored and remained stable. He did have anemia (has a history) and he was restarted on oral Ferrous sulfate bid. He has a history of OSA and used CPAP at night. He was surgically stable for transfer from the ICU to 2C on 02/29 and a bed was available on 03/01. Pericardial drains output gradually decreased and daily chest x rays remained stable. All drains were removed by 03/03.  The patient will be treated with a 30 day course of steroids.  He has been resumed on home Eliquis.  He is ambulating without difficulty. His surgical incisions are free from signs of infection.  He is medically stable for discharge home today.  In the afib clinic 10/06/22, he continues in rate controlled afib. He is now on eliquis  5 mg bid (creatinine  2.60 but less than 80 yo and weight is over 60 kg) since 09/29/22. He is also on 30 days of prednisone. Discussed a cardioversion in 4-6 weeks time after he has satisfied the 3 week uninterrupted DOAC requirement and  has finished steroids. He states he feels tired but he has felt tired for a long time so he cannot discern if  he feels different in afib. He is being compliant with eliquis and metoprolol.   On follow up today, he continues in rate controlled afib. He has been compliant with Eliquis 5 mg BID and has not missed any doses. No bleeding concerns. He will finish 1 month prescription of prednisone on 4/5. He feels tired all the time and has been for a long time so unsure if he   feels differently while in afib. Review of records demonstrate he takes ASA daily due to renal artery stent.   Today, he denies symptoms of palpitations, chest pain, shortness of breath, orthopnea, PND, lower extremity edema, dizziness, presyncope, syncope, or neurologic sequela. The patient is tolerating medications without difficulties and is otherwise without complaint today.   Past Medical History:  Diagnosis Date   Arthritis    Carotid artery occlusion    Chronic kidney disease     DVT (deep venous thrombosis) (HCC) 2008   GERD (gastroesophageal reflux disease)    takes Protonix daily   Gout    takes Allopurinol daily and Indomethacin prn   H/O renal artery stenosis    H/O: GI bleed    History of blood transfusion    History of colon polyps    History of gastric ulcer    Hyperlipidemia    takes Lipitor daily   Hypertension    takes Metoprolol and Amlodipine daily   Joint pain    Joint swelling    Sleep apnea    Past Surgical History:  Procedure Laterality Date   CAROTID ENDARTERECTOMY  2008   right CEA   CHOLECYSTECTOMY  mid 90's   COLON SURGERY     COLONOSCOPY     ENDARTERECTOMY  06/02/2012   Procedure: ENDARTERECTOMY CAROTID;  Surgeon: Vance W Brabham, MD;  Location: [facility] OR;  Service: Vascular;  Laterality: Left;   ESOPHAGOGASTRODUODENOSCOPY     HERNIA REPAIR     umbilical hernia   PATCH ANGIOPLASTY  06/02/2012   Procedure: PATCH ANGIOPLASTY;  Surgeon: Vance W Brabham, MD;  Location: [facility] OR;  Service: Vascular;  Laterality: Left;  using Vascu-Guard Patch   PERIPHERAL VASCULAR CATHETERIZATION Right 12/03/2015   Procedure: Peripheral Vascular Balloon Angioplasty;  Surgeon: Vance W Brabham, MD;  Location: [facility] INVASIVE CV [facility];  Service: Cardiovascular;  Laterality: Right;  RENAL unsuccessful   RENAL ARTERY STENT  2009   Right renal artery stenting-  Right Kidney   small intestine removed  2001   d/t random gi bleeding   SUBXYPHOID PERICARDIAL WINDOW N/A 09/23/2022   Procedure: SUBXYPHOID PERICARDIAL WINDOW;  Surgeon: Hendrickson, Steven C, MD;  Location: [facility] OR;  Service: Thoracic;  Laterality: N/A;   TEE WITHOUT CARDIOVERSION N/A 09/23/2022   Procedure: TRANSESOPHAGEAL ECHOCARDIOGRAM;  Surgeon: Hendrickson, Steven C, MD;  Location: [facility] OR;  Service: Thoracic;  Laterality: N/A;    Current Outpatient Medications  Medication Sig Dispense Refill   allopurinol (ZYLOPRIM) 100 MG tablet Take 1 tablet (100 mg total) by mouth 2 (two) times daily.      amLODipine-atorvastatin (CADUET) 10-40 MG tablet Take 1 tablet by mouth every evening.     apixaban (ELIQUIS) 5 MG TABS tablet Take 1 tablet (5 mg total) by mouth 2 (two) times daily. 60 tablet 3   aspirin EC 81 MG tablet Take 1 tablet (81 mg total) by mouth daily. Swallow whole. 30 tablet 12   Cholecalciferol (VITAMIN D3) 50 MCG (2000 UT) TABS Take 2,000 Units by mouth daily.     cloNIDine (CATAPRES) 0.3 MG tablet Take 0.3 mg by mouth 3 (three) times daily.     donepezil (ARICEPT) 5 MG tablet Take 1 tablet (5 mg total) by mouth at bedtime. 90 tablet 3   doxazosin (CARDURA) 2 MG tablet Take 4 mg by mouth 2 (two) times daily.     ferrous sulfate 325 (65 FE) MG tablet Take 325 mg by mouth in the morning and at bedtime.       furosemide (LASIX) 80 MG tablet Take 80 mg by mouth 2 (two) times daily.     metoprolol tartrate (LOPRESSOR) 25 MG tablet Take 1 tablet (25 mg total) by mouth 2 (two) times daily. 30 tablet 1   potassium chloride (MICRO-K) 10 MEQ CR capsule Take 10 mEq by mouth daily.     predniSONE (DELTASONE) 10 MG tablet Take 1 tablet (10 mg total) by mouth daily before breakfast. 30 tablet 0   No current facility-administered medications for this encounter.    Allergies  Allergen Reactions   Plavix [Clopidogrel Bisulfate] Other (See Comments)    GI Bleed   Uloric [Febuxostat] Other (See Comments)    Unknown reaction    Social History   Socioeconomic History   Marital status: Married    Spouse name: Melrose   Number of children: 2   Years of education: 14   Highest education level: Associate degree: occupational, technical, or vocational program  Occupational History   Occupation: Retired  Tobacco Use   Smoking status: Former    Packs/day: 1.50    Years: 36.00    Additional pack years: 0.00    Total pack years: 54.00    Types: Cigarettes    Quit date: 12/26/1999    Years since quitting: 22.8   Smokeless tobacco: Never  Vaping Use   Vaping Use: Never used  Substance and  Sexual Activity   Alcohol use: No    Comment: Quit in 1998   Drug use: No   Sexual activity: Yes    Birth control/protection: None  Other Topics Concern   Not on file  Social History Narrative   Lives with wife.     Social Determinants of Health   Financial Resource Strain: Low Risk  (09/29/2022)   Overall Financial Resource Strain (CARDIA)    Difficulty of Paying Living Expenses: Not very hard  Food Insecurity: No Food Insecurity (09/29/2022)   Hunger Vital Sign    Worried About Running Out of Food in the Last Year: Never true    Ran Out of Food in the Last Year: Never true  Transportation Needs: No Transportation Needs (09/29/2022)   PRAPARE - Transportation    Lack of Transportation (Medical): No    Lack of Transportation (Non-Medical): No  Physical Activity: Inactive (09/21/2019)   Exercise Vital Sign    Days of Exercise per Week: 0 days    Minutes of Exercise per Session: 0 min  Stress: No Stress Concern Present (09/21/2019)   [employer] - Occupational Stress Questionnaire    Feeling of Stress : Not at all  Social Connections: Socially Integrated (09/21/2019)   Social Connection and Isolation Panel [NHANES]    Frequency of Communication with Friends and Family: More than three times a week    Frequency of Social Gatherings with Friends and Family: More than three times a week    Attends Religious Services: More than 4 times per year    Active Member of Clubs or Organizations: Yes    Attends Club or Organization Meetings: More than 4 times per year    Marital Status: Married  Intimate Partner Violence: Not At Risk (09/23/2022)   Humiliation, Afraid, Rape, and Kick questionnaire    Fear of Current or Ex-Partner: No    Emotionally Abused: No    Physically Abused: No    Sexually Abused: No    Family History  Problem Relation Age of Onset   Hypertension Mother    Hyperlipidemia Mother      Other Mother        varicose veins   Hyperlipidemia  Father    Hypertension Father    Diabetes Father    Clotting disorder Father    Deep vein thrombosis Father    Hypertension Sister    Diabetes Sister    Hyperlipidemia Sister    Varicose Veins Sister    Other Sister        Bleeding problems   Hypertension Brother    Hyperlipidemia Brother     ROS- All systems are reviewed and negative except as per the HPI above  Physical Exam: Vitals:   10/22/22 1352  BP: (!) 180/84  Pulse: 78  Weight: 75.5 kg  Height: 5' 10" (1.778 m)   Wt Readings from Last 3 Encounters:  10/22/22 75.5 kg  10/13/22 74.8 kg  10/06/22 77.3 kg    Labs: Lab Results  Component Value Date   NA 133 (L) 09/28/2022   K 3.2 (L) 09/28/2022   CL 94 (L) 09/28/2022   CO2 28 09/28/2022   GLUCOSE 140 (H) 09/28/2022   BUN 44 (H) 09/28/2022   CREATININE 2.60 (H) 09/28/2022   CALCIUM 8.2 (L) 09/28/2022   MG 2.0 08/30/2022   Lab Results  Component Value Date   INR 1.1 09/16/2022   Lab Results  Component Value Date   CHOL 131 08/21/2022   HDL 68 08/21/2022   LDLCALC 47 08/21/2022   TRIG 82 08/21/2022     GEN- The patient is well appearing, alert and oriented x 3 today.   Head- normocephalic, atraumatic Eyes-  Sclera clear, conjunctiva pink Ears- hearing intact Oropharynx- clear Neck- supple, no JVP Lymph- no cervical lymphadenopathy Lungs- Clear to ausculation bilaterally, normal work of breathing Heart- Irregular rate and rhythm, no murmurs, rubs or gallops, PMI not laterally displaced GI- soft, NT, ND, + BS Extremities- no clubbing, cyanosis, or edema MS- no significant deformity or atrophy Skin- no rash or lesion Psych- euthymic mood, full affect Neuro- strength and sensation are intact  EKG-Vent. rate 78 BPM PR interval * ms QRS duration 96 ms QT/QTcB 386/440 ms  Atrial fibrillation Rightward axis  Assessment and Plan:  1. Persistent afib  He is still in afib today as he was on 3/12 visit in Afib clinic.  Found after cardiac  surgery.   Continue metoprolol tartrate 25 mg bid   He has been compliant with Eliquis and no missed doses.  We will schedule DCCV. Follow up for DCCV will be with primary cardiologist since appropriate timeframe.  2. CHA2DS2VASc  score of at least 4 Continue eliquis 5 mg bid   No missed doses.  3. OSA Continue cpap  4. S/p Sub-xiphoid pericardial window with removal of 1000 ml of bloody serous drainage., 09/23/22 Saw Dr. Hendrickson on 3/19 and will see him prn.   F/U with Dr. Hochrein as scheduled. F/u Afib clinic 2 months.  Elad Macphail, PA-C [facility] [address] [place], [place] [address] [phone]      

## 2022-10-22 NOTE — Progress Notes (Signed)
Primary Care Physician: [name], [name], MD Referring Physician: [facility] admit f/u  CV surgeon- Dr. Roxan Hockey  Cardiologist: Dr. Shelby Mattocks [name] [name] is a 77 y.o. male with a h/o  numerous medical problems including a pericardial effusion, severe thoracic and abdominal aortic atherosclerosis, renal artery stenosis, bilateral carotid endarterectomy, DVT, hypertension, hyperlipidemia, ulcers, reflux, arthritis, and gout.   He has had a longstanding pericardial effusion.  He has chronic peripheral edema.  Recently was in the emergency room on 08/30/2021 with gastrointestinal complaints.  As part of his workup he had a CT of the chest abdomen and pelvis.  It showed a large pericardial effusion.  He left AMA at that time.  In the meantime he has become progressively more short of breath.  Now even with minimal activities.  Also has had worsening of his peripheral edema.  Repeat echocardiogram showed an increase in size of the pericardial effusion with some early signs of tamponade.  He was evaluated by Dr. Roxan Hockey who recommended sub-xiphoid pericardial window creation.  The risks and benefits of the procedure were explained to the patient and he was agreeable to proceed.  He was taken to the OP 09/23/22 and underwent  Sub-xiphoid pericardial window with removal of 1000 ml of bloody serous drainage.  He tolerated the procedure without difficulty and was taken to the PACU in stable condition. He went into a fib with a controlled ventricular rate, bradycardic at times. Cardiology was consulted. Lopressor was decreased to 12.5 mg bid. He will be referred to the a fib clinic at discharge. Because the pericardial drainage was hemorrhagic, it is recommended that anticoagulation not be started for 4-5 days from the day of surgery. He was hypertensive on 03/01 so Amlodipine was restarted (he took Caduet prior to surgery). He has a history of CKD (stage IV) and was on Lasix 80 mg po bid prior to surgery.  Creatinine was monitored and remained stable. He did have anemia (has a history) and he was restarted on oral Ferrous sulfate bid. He has a history of OSA and used CPAP at night. He was surgically stable for transfer from the ICU to [facility] on 02/29 and a bed was available on 03/01. Pericardial drains output gradually decreased and daily chest x rays remained stable. All drains were removed by 03/03.  The patient will be treated with a 30 day course of steroids.  He has been resumed on home Eliquis.  He is ambulating without difficulty. His surgical incisions are free from signs of infection.  He is medically stable for discharge home today.  In the afib clinic 10/06/22, he continues in rate controlled afib. He is now on eliquis  5 mg bid (creatinine  2.60 but less than 54 yo and weight is over 60 kg) since 09/29/22. He is also on 30 days of prednisone. Discussed a cardioversion in 4-6 weeks time after he has satisfied the 3 week uninterrupted DOAC requirement and  has finished steroids. He states he feels tired but he has felt tired for a long time so he cannot discern if  he feels different in afib. He is being compliant with eliquis and metoprolol.   On follow up today, he continues in rate controlled afib. He has been compliant with Eliquis 5 mg BID and has not missed any doses. No bleeding concerns. He will finish 1 month prescription of prednisone on 4/5. He feels tired all the time and has been for a long time so unsure if he  feels differently while in afib. Review of records demonstrate he takes ASA daily due to renal artery stent.   Today, he denies symptoms of palpitations, chest pain, shortness of breath, orthopnea, PND, lower extremity edema, dizziness, presyncope, syncope, or neurologic sequela. The patient is tolerating medications without difficulties and is otherwise without complaint today.   Past Medical History:  Diagnosis Date   Arthritis    Carotid artery occlusion    Chronic kidney disease     DVT (deep venous thrombosis) ([place]) 2008   GERD (gastroesophageal reflux disease)    takes Protonix daily   Gout    takes Allopurinol daily and Indomethacin prn   H/O renal artery stenosis    H/O: GI bleed    History of blood transfusion    History of colon polyps    History of gastric ulcer    Hyperlipidemia    takes Lipitor daily   Hypertension    takes Metoprolol and Amlodipine daily   Joint pain    Joint swelling    Sleep apnea    Past Surgical History:  Procedure Laterality Date   CAROTID ENDARTERECTOMY  2008   right CEA   CHOLECYSTECTOMY  mid 90's   COLON SURGERY     COLONOSCOPY     ENDARTERECTOMY  06/02/2012   Procedure: ENDARTERECTOMY CAROTID;  Surgeon: Serafina Mitchell, MD;  Location: [facility];  Service: Vascular;  Laterality: Left;   ESOPHAGOGASTRODUODENOSCOPY     HERNIA REPAIR     umbilical hernia   PATCH ANGIOPLASTY  06/02/2012   Procedure: PATCH ANGIOPLASTY;  Surgeon: Serafina Mitchell, MD;  Location: [facility];  Service: Vascular;  Laterality: Left;  using Vascu-Guard Patch   PERIPHERAL VASCULAR CATHETERIZATION Right 12/03/2015   Procedure: Peripheral Vascular Balloon Angioplasty;  Surgeon: Serafina Mitchell, MD;  Location: [facility];  Service: Cardiovascular;  Laterality: Right;  RENAL unsuccessful   RENAL ARTERY STENT  2009   Right renal artery stenting-  Right Kidney   small intestine removed  2001   d/t random gi bleeding   SUBXYPHOID PERICARDIAL WINDOW N/A 09/23/2022   Procedure: SUBXYPHOID PERICARDIAL WINDOW;  Surgeon: Melrose Nakayama, MD;  Location: [place];  Service: Thoracic;  Laterality: N/A;   TEE WITHOUT CARDIOVERSION N/A 09/23/2022   Procedure: TRANSESOPHAGEAL ECHOCARDIOGRAM;  Surgeon: Melrose Nakayama, MD;  Location: [facility];  Service: Thoracic;  Laterality: N/A;    Current Outpatient Medications  Medication Sig Dispense Refill   allopurinol (ZYLOPRIM) 100 MG tablet Take 1 tablet (100 mg total) by mouth 2 (two) times daily.      amLODipine-atorvastatin (CADUET) 10-40 MG tablet Take 1 tablet by mouth every evening.     apixaban (ELIQUIS) 5 MG TABS tablet Take 1 tablet (5 mg total) by mouth 2 (two) times daily. 60 tablet 3   aspirin EC 81 MG tablet Take 1 tablet (81 mg total) by mouth daily. Swallow whole. 30 tablet 12   Cholecalciferol (VITAMIN D3) 50 MCG (2000 UT) TABS Take 2,000 Units by mouth daily.     cloNIDine (CATAPRES) 0.3 MG tablet Take 0.3 mg by mouth 3 (three) times daily.     donepezil (ARICEPT) 5 MG tablet Take 1 tablet (5 mg total) by mouth at bedtime. 90 tablet 3   doxazosin (CARDURA) 2 MG tablet Take 4 mg by mouth 2 (two) times daily.     ferrous sulfate 325 (65 FE) MG tablet Take 325 mg by mouth in the morning and at bedtime.  furosemide (LASIX) 80 MG tablet Take 80 mg by mouth 2 (two) times daily.     metoprolol tartrate (LOPRESSOR) 25 MG tablet Take 1 tablet (25 mg total) by mouth 2 (two) times daily. 30 tablet 1   potassium chloride (MICRO-K) 10 MEQ CR capsule Take 10 mEq by mouth daily.     predniSONE (DELTASONE) 10 MG tablet Take 1 tablet (10 mg total) by mouth daily before breakfast. 30 tablet 0   No current facility-administered medications for this encounter.    Allergies  Allergen Reactions   Plavix [Clopidogrel Bisulfate] Other (See Comments)    GI Bleed   Uloric [Febuxostat] Other (See Comments)    Unknown reaction    Social History   Socioeconomic History   Marital status: Married    Spouse name: Melrose   Number of children: 2   Years of education: 14   Highest education level: Associate degree: occupational, Hotel manager, or vocational program  Occupational History   Occupation: Retired  Tobacco Use   Smoking status: Former    Packs/day: 1.50    Years: 36.00    Additional pack years: 0.00    Total pack years: 54.00    Types: Cigarettes    Quit date: 12/26/1999    Years since quitting: 22.8   Smokeless tobacco: Never  Vaping Use   Vaping Use: Never used  Substance and  Sexual Activity   Alcohol use: No    Comment: Quit in 1998   Drug use: No   Sexual activity: Yes    Birth control/protection: None  Other Topics Concern   Not on file  Social History Narrative   Lives with wife.     Social Determinants of Health   Financial Resource Strain: Low Risk  (09/29/2022)   Overall Financial Resource Strain (CARDIA)    Difficulty of Paying Living Expenses: Not very hard  Food Insecurity: No Food Insecurity (09/29/2022)   Hunger Vital Sign    Worried About Running Out of Food in the Last Year: Never true    Ran Out of Food in the Last Year: Never true  Transportation Needs: No Transportation Needs (09/29/2022)   PRAPARE - Hydrologist (Medical): No    Lack of Transportation (Non-Medical): No  Physical Activity: Inactive (09/21/2019)   Exercise Vital Sign    Days of Exercise per Week: 0 days    Minutes of Exercise per Session: 0 min  Stress: No Stress Concern Present (09/21/2019)   [place]    Feeling of Stress : Not at all  Social Connections: [place] (09/21/2019)   Social Connection and Isolation Panel [NHANES]    Frequency of Communication with Friends and Family: More than three times a week    Frequency of Social Gatherings with Friends and Family: More than three times a week    Attends Religious Services: More than 4 times per year    Active Member of [employer] or Organizations: Yes    Attends Archivist Meetings: More than 4 times per year    Marital Status: Married  Human resources officer Violence: Not At Risk (09/23/2022)   Humiliation, Afraid, Rape, and Kick questionnaire    Fear of Current or Ex-Partner: No    Emotionally Abused: No    Physically Abused: No    Sexually Abused: No    Family History  Problem Relation Age of Onset   Hypertension Mother    Hyperlipidemia Mother  Other Mother        varicose veins   Hyperlipidemia  Father    Hypertension Father    Diabetes Father    Clotting disorder Father    Deep vein thrombosis Father    Hypertension Sister    Diabetes Sister    Hyperlipidemia Sister    Varicose Veins Sister    Other Sister        Bleeding problems   Hypertension Brother    Hyperlipidemia Brother     ROS- All systems are reviewed and negative except as per the HPI above  Physical Exam: Vitals:   10/22/22 1352  BP: (!) 180/84  Pulse: 78  Weight: 75.5 kg  Height: 5\' 10"  (1.778 m)   Wt Readings from Last 3 Encounters:  10/22/22 75.5 kg  10/13/22 74.8 kg  10/06/22 77.3 kg    Labs: Lab Results  Component Value Date   NA 133 (L) 09/28/2022   K 3.2 (L) 09/28/2022   CL 94 (L) 09/28/2022   CO2 28 09/28/2022   GLUCOSE 140 (H) 09/28/2022   BUN 44 (H) 09/28/2022   CREATININE 2.60 (H) 09/28/2022   CALCIUM 8.2 (L) 09/28/2022   MG 2.0 08/30/2022   Lab Results  Component Value Date   INR 1.1 09/16/2022   Lab Results  Component Value Date   CHOL 131 08/21/2022   HDL 68 08/21/2022   LDLCALC 47 08/21/2022   TRIG 82 08/21/2022     GEN- The patient is well appearing, alert and oriented x 3 today.   Head- normocephalic, atraumatic Eyes-  Sclera clear, conjunctiva pink Ears- hearing intact Oropharynx- clear Neck- supple, no JVP Lymph- no cervical lymphadenopathy Lungs- Clear to ausculation bilaterally, normal work of breathing Heart- Irregular rate and rhythm, no murmurs, rubs or gallops, PMI not laterally displaced GI- soft, NT, ND, + BS Extremities- no clubbing, cyanosis, or edema MS- no significant deformity or atrophy Skin- no rash or lesion Psych- euthymic mood, full affect Neuro- strength and sensation are intact  EKG-Vent. rate 78 BPM PR interval * ms QRS duration 96 ms QT/QTcB 386/440 ms  Atrial fibrillation Rightward axis  Assessment and Plan:  1. Persistent afib  He is still in afib today as he was on 3/12 visit in Afib clinic.  Found after cardiac  surgery.   Continue metoprolol tartrate 25 mg bid   He has been compliant with Eliquis and no missed doses.  We will schedule DCCV. Follow up for DCCV will be with primary cardiologist since appropriate timeframe.  2. CHA2DS2VASc  score of at least 4 Continue eliquis 5 mg bid   No missed doses.  3. OSA Continue cpap  4. S/p Sub-xiphoid pericardial window with removal of 1000 ml of bloody serous drainage., 09/23/22 Saw Dr. Roxan Hockey on 3/19 and will see him prn.   F/U with Dr. Percival Spanish as scheduled. F/u Afib clinic 2 months.  Emily Filbert, PA-C [facility] [address] [place], [place] [address] [phone]

## 2022-10-22 NOTE — Patient Instructions (Addendum)
Labs need to be drawn anytime between April 4th-8th at [facility] - orders attached   Cardioversion scheduled for: Thursday, April 11th   - Arrive at the [facility] and go to admitting at [facility] not eat or drink anything after midnight the night prior to your procedure.   - Take all your morning medication (except diabetic medications) with a sip of water prior to arrival.  - You will not be able to drive home after your procedure.    - Do NOT miss any doses of your blood thinner - if you should miss a dose please notify our office immediately.   - If you feel as if you go back into normal rhythm prior to scheduled cardioversion, please notify our office immediately.   If your procedure is canceled in the cardioversion suite you will be charged a cancellation fee.

## 2022-10-25 ENCOUNTER — Other Ambulatory Visit: Payer: Self-pay | Admitting: Physician Assistant

## 2022-10-28 DIAGNOSIS — N401 Enlarged prostate with lower urinary tract symptoms: Secondary | ICD-10-CM | POA: Diagnosis not present

## 2022-10-28 DIAGNOSIS — R3915 Urgency of urination: Secondary | ICD-10-CM | POA: Diagnosis not present

## 2022-11-03 ENCOUNTER — Ambulatory Visit: Payer: Medicare Other | Admitting: Cardiology

## 2022-11-04 ENCOUNTER — Other Ambulatory Visit (HOSPITAL_COMMUNITY): Payer: Self-pay | Admitting: *Deleted

## 2022-11-07 ENCOUNTER — Other Ambulatory Visit: Payer: Self-pay | Admitting: Physician Assistant

## 2022-11-09 ENCOUNTER — Other Ambulatory Visit: Payer: Self-pay | Admitting: Cardiology

## 2022-11-11 ENCOUNTER — Telehealth: Payer: Self-pay | Admitting: Cardiovascular Disease

## 2022-11-11 ENCOUNTER — Other Ambulatory Visit: Payer: Self-pay | Admitting: *Deleted

## 2022-11-11 DIAGNOSIS — I4819 Other persistent atrial fibrillation: Secondary | ICD-10-CM

## 2022-11-11 NOTE — Telephone Encounter (Signed)
Orders ordered by Afib Clinic 3/28-orders as future.    Orders released. Attempted to call [facility] in -unable to reach.    Orders now active-should be accessible to [facility].

## 2022-11-11 NOTE — Telephone Encounter (Signed)
[facility] in [place] states patient is there for labs, but unsure as to what kind of labs needed. Needs orders.

## 2022-11-12 LAB — CBC
Hematocrit: 33.1 % — ABNORMAL LOW (ref 37.5–51.0)
Hemoglobin: 10.7 g/dL — ABNORMAL LOW (ref 13.0–17.7)
MCH: 27 pg (ref 26.6–33.0)
MCHC: 32.3 g/dL (ref 31.5–35.7)
MCV: 84 fL (ref 79–97)
Platelets: 317 10*3/uL (ref 150–450)
RBC: 3.96 x10E6/uL — ABNORMAL LOW (ref 4.14–5.80)
RDW: 15.4 % (ref 11.6–15.4)
WBC: 8.1 10*3/uL (ref 3.4–10.8)

## 2022-11-12 LAB — BASIC METABOLIC PANEL
BUN/Creatinine Ratio: 13 (ref 10–24)
BUN: 31 mg/dL — ABNORMAL HIGH (ref 8–27)
CO2: 25 mmol/L (ref 20–29)
Calcium: 9 mg/dL (ref 8.6–10.2)
Chloride: 97 mmol/L (ref 96–106)
Creatinine, Ser: 2.33 mg/dL — ABNORMAL HIGH (ref 0.76–1.27)
Glucose: 128 mg/dL — ABNORMAL HIGH (ref 70–99)
Potassium: 3.8 mmol/L (ref 3.5–5.2)
Sodium: 139 mmol/L (ref 134–144)
eGFR: 28 mL/min/{1.73_m2} — ABNORMAL LOW (ref >59)

## 2022-11-17 NOTE — Pre-Procedure Instructions (Signed)
Spoke with patient regarding procedure instructions for tomorrow.  Offered patient an earlier time, he states he can't come early.  Told him to come at original scheduled time.  Nothing to eat or drink after midnight,  You will need responsible adult to drive you home and stay with you for 24 hours.  He takes Eliquis twice a day, hasn't missed any doses.  He will take dose in the morning.

## 2022-11-18 ENCOUNTER — Other Ambulatory Visit: Payer: Self-pay

## 2022-11-18 ENCOUNTER — Ambulatory Visit (HOSPITAL_COMMUNITY): Payer: Medicare Other | Admitting: Anesthesiology

## 2022-11-18 ENCOUNTER — Encounter (HOSPITAL_COMMUNITY)
Admission: RE | Disposition: A | Discharge status: Home / Self Care | Payer: Self-pay | Source: Home / Self Care | Attending: Cardiovascular Disease

## 2022-11-18 ENCOUNTER — Ambulatory Visit (HOSPITAL_COMMUNITY)
Admission: RE | Admit: 2022-11-18 | Discharge: 2022-11-18 | Disposition: A | Discharge status: Home / Self Care | Payer: Medicare Other | Attending: Cardiovascular Disease | Admitting: Cardiovascular Disease

## 2022-11-18 ENCOUNTER — Encounter (HOSPITAL_COMMUNITY): Payer: Self-pay | Admitting: Cardiovascular Disease

## 2022-11-18 ENCOUNTER — Ambulatory Visit (HOSPITAL_BASED_OUTPATIENT_CLINIC_OR_DEPARTMENT_OTHER): Payer: Medicare Other | Admitting: Anesthesiology

## 2022-11-18 DIAGNOSIS — I4819 Other persistent atrial fibrillation: Secondary | ICD-10-CM | POA: Insufficient documentation

## 2022-11-18 DIAGNOSIS — I10 Essential (primary) hypertension: Secondary | ICD-10-CM | POA: Insufficient documentation

## 2022-11-18 DIAGNOSIS — I700 Atherosclerosis of aorta: Secondary | ICD-10-CM | POA: Diagnosis not present

## 2022-11-18 DIAGNOSIS — E785 Hyperlipidemia, unspecified: Secondary | ICD-10-CM | POA: Diagnosis not present

## 2022-11-18 DIAGNOSIS — Z7982 Long term (current) use of aspirin: Secondary | ICD-10-CM | POA: Insufficient documentation

## 2022-11-18 DIAGNOSIS — M109 Gout, unspecified: Secondary | ICD-10-CM | POA: Diagnosis not present

## 2022-11-18 DIAGNOSIS — Z7901 Long term (current) use of anticoagulants: Secondary | ICD-10-CM | POA: Diagnosis not present

## 2022-11-18 DIAGNOSIS — I4891 Unspecified atrial fibrillation: Secondary | ICD-10-CM | POA: Diagnosis not present

## 2022-11-18 DIAGNOSIS — G4733 Obstructive sleep apnea (adult) (pediatric): Secondary | ICD-10-CM | POA: Diagnosis not present

## 2022-11-18 DIAGNOSIS — Z87891 Personal history of nicotine dependence: Secondary | ICD-10-CM

## 2022-11-18 DIAGNOSIS — Z9989 Dependence on other enabling machines and devices: Secondary | ICD-10-CM | POA: Diagnosis not present

## 2022-11-18 DIAGNOSIS — Z79899 Other long term (current) drug therapy: Secondary | ICD-10-CM | POA: Insufficient documentation

## 2022-11-18 DIAGNOSIS — I6523 Occlusion and stenosis of bilateral carotid arteries: Secondary | ICD-10-CM | POA: Diagnosis not present

## 2022-11-18 DIAGNOSIS — I701 Atherosclerosis of renal artery: Secondary | ICD-10-CM | POA: Insufficient documentation

## 2022-11-18 DIAGNOSIS — I739 Peripheral vascular disease, unspecified: Secondary | ICD-10-CM | POA: Diagnosis not present

## 2022-11-18 HISTORY — PX: CARDIOVERSION: SHX1299

## 2022-11-18 SURGERY — CARDIOVERSION
Anesthesia: General

## 2022-11-18 MED ORDER — LIDOCAINE 2% (20 MG/ML) 5 ML SYRINGE
INTRAMUSCULAR | Status: DC | PRN
  Administered 2022-11-18: 40 mg via INTRAVENOUS

## 2022-11-18 MED ORDER — SODIUM CHLORIDE 0.9 % IV SOLN: INTRAVENOUS | Status: DC

## 2022-11-18 MED ORDER — PROPOFOL 10 MG/ML IV BOLUS
INTRAVENOUS | Status: DC | PRN
  Administered 2022-11-18: 80 mg via INTRAVENOUS

## 2022-11-18 SURGICAL SUPPLY — 1 items: ELECT DEFIB PAD ADLT CADENCE (PAD) ×1 IMPLANT

## 2022-11-18 NOTE — Discharge Instructions (Signed)

## 2022-11-18 NOTE — Anesthesia Preprocedure Evaluation (Addendum)
Anesthesia Evaluation  Patient identified by MRN, date of birth, ID band Patient awake    Reviewed: Allergy & Precautions, NPO status , Patient's Chart, lab work & pertinent test results, reviewed documented beta blocker date and time   History of Anesthesia Complications Negative for: history of anesthetic complications  Airway Mallampati: I  TM Distance: >3 FB Neck ROM: Full    Dental  (+) Dental Advisory Given, Caps   Pulmonary sleep apnea and Continuous Positive Airway Pressure Ventilation , former smoker   breath sounds clear to auscultation       Cardiovascular hypertension, Pt. on medications and Pt. on home beta blockers (-) angina + Peripheral Vascular Disease and + DVT   Rhythm:Irregular Rate:Normal  08/2022 ECHO: EF 55-60%. 1. The LV has normal function, no regional wall motion abnormalities. There is mild concentric LVH. Left ventricular diastolic parameters are indeterminate.   2. RVF is normal. The right ventricular size is normal. Tricuspid regurgitation signal is inadequate for assessing PA pressure.   3. Left atrial size was moderately dilated.   4. The mitral valve is normal in structure. No evidence of MR. No evidence of mitral stenosis.   5. The aortic valve is tricuspid. There is mild calcification of the aortic valve. Aortic valve regurgitation is not visualized. No aortic stenosis is present.  6. early tamponade physiology. Large pericardial effusion. The pericardial effusion is circumferential.      Neuro/Psych negative neurological ROS     GI/Hepatic Neg liver ROS,GERD  Medicated and Controlled,,  Endo/Other  negative endocrine ROS    Renal/GU Renal InsufficiencyRenal disease     Musculoskeletal   Abdominal   Peds  Hematology Eliquis   Anesthesia Other Findings   Reproductive/Obstetrics                             Anesthesia Physical Anesthesia Plan  ASA:  3  Anesthesia Plan: General   Post-op Pain Management: Minimal or no pain anticipated   Induction: Intravenous  PONV Risk Score and Plan: 2 and Treatment may vary due to age or medical condition  Airway Management Planned: Nasal Cannula and Natural Airway  Additional Equipment: None  Intra-op Plan:   Post-operative Plan:   Informed Consent: I have reviewed the patients History and Physical, chart, labs and discussed the procedure including the risks, benefits and alternatives for the proposed anesthesia with the patient or authorized representative who has indicated his/her understanding and acceptance.     Dental advisory given  Plan Discussed with: CRNA and Surgeon  Anesthesia Plan Comments:         Anesthesia Quick Evaluation

## 2022-11-18 NOTE — Interval H&P Note (Signed)
History and Physical Interval Note:  11/18/2022 2:34 PM  James [name]  has presented today for surgery, with the diagnosis of AFIB.  The various methods of treatment have been discussed with the patient and family. After consideration of risks, benefits and other options for treatment, the patient has consented to  Procedure(s): CARDIOVERSION (N/A) as a surgical intervention.  The patient's history has been reviewed, patient examined, no change in status, stable for surgery.  I have reviewed the patient's chart and labs.  Questions were answered to the patient's satisfaction.     Chilton Si, MD

## 2022-11-18 NOTE — Anesthesia Postprocedure Evaluation (Signed)
Anesthesia Post Note  Patient: James [name]  Procedure(s) Performed: CARDIOVERSION     Patient location during evaluation: Cath Lab Anesthesia Type: General Level of consciousness: awake and alert, patient cooperative and oriented Pain management: pain level controlled Vital Signs Assessment: post-procedure vital signs reviewed and stable Respiratory status: spontaneous breathing, nonlabored ventilation and respiratory function stable Cardiovascular status: blood pressure returned to baseline and stable Postop Assessment: no apparent nausea or vomiting and able to ambulate Anesthetic complications: no   No notable events documented.  Last Vitals:  Vitals:   11/18/22 1500 11/18/22 1501  BP: (!) 167/72   Pulse: 67 66  Resp: 17 20  Temp:    SpO2: 98% 99%    Last Pain:  Vitals:   11/18/22 1341  TempSrc: Temporal  PainSc: 0-No pain                 Arvie Bartholomew,E. Darlina Mccaughey

## 2022-11-18 NOTE — Transfer of Care (Signed)
Immediate Anesthesia Transfer of Care Note  Patient: James [name]  Procedure(s) Performed: CARDIOVERSION  Patient Location: PACU and Cath Lab  Anesthesia Type:General  Level of Consciousness: awake, alert , oriented, and patient cooperative  Airway & Oxygen Therapy: Patient Spontanous Breathing  Post-op Assessment: Report given to RN and Post -op Vital signs reviewed and stable  Post vital signs: Reviewed and stable  Last Vitals:  Vitals Value Taken Time  BP 167/72 1501  Temp    Pulse 68   Resp 15   SpO2 99     Last Pain:  Vitals:   11/18/22 1341  TempSrc: Temporal  PainSc: 0-No pain         Complications: No notable events documented.

## 2022-11-18 NOTE — CV Procedure (Signed)
Electrical Cardioversion Procedure Note James [name] [id number] 11/06/1945  Procedure: Electrical Cardioversion Indications:  Atrial Fibrillation  Procedure Details Consent: Risks of procedure as well as the alternatives and risks of each were explained to the (patient/caregiver).  Consent for procedure obtained. Time Out: Verified patient identification, verified procedure, site/side was marked, verified correct patient position, special equipment/implants available, medications/allergies/relevent history reviewed, required imaging and test results available.  Performed  Patient placed on cardiac monitor, pulse oximetry, supplemental oxygen as necessary.  Sedation given:  propofol Pacer pads placed anterior and posterior chest.  Cardioverted 1 time(s).  Cardioverted at 150J.  Evaluation Findings: Post procedure EKG shows:  sinus bradycardia followed by sinus rhythm Complications: None Patient did tolerate procedure well.   Chilton Si 11/18/2022, 2:59 PM

## 2022-11-18 NOTE — Progress Notes (Unsigned)
  Cardiology Office Note:   Date:  11/19/2022  ID:  James [name], DOB 10/03/1945, MRN [id number]  History of Present Illness:   James [name] is a 77 y.o. male who presents for evaluation of fatigue and difficult to control hypertension and a large pericardial effusion. I followed this for a long while but the effusion became larger and he had elective pericardial window in March 2023.      He had atrial fib and had cardioversion on 11/18/22.    Since I last saw him he says he is just fatigued.  He is not having any new shortness of breath, PND or orthopnea.  However, he does not think he feel particularly better either.  He is not having any chest pressure, neck or arm discomfort.    ROS: As stated in the HPI and negative for all other systems.   Studies Reviewed:    EKG:  NA    Risk Assessment/Calculations:    CHA2DS2-VASc Score = 4   This indicates a 4.8% annual risk of stroke. The patient's score is based upon: CHF History: 0 HTN History: 1 Diabetes History: 0 Stroke History: 0 Vascular Disease History: 1 Age Score: 2 Gender Score: 0     Physical Exam:   VS:  BP (!) 148/72 (BP Location: Left Arm, Patient Position: Sitting, Cuff Size: Normal)   Pulse 88   Ht  (1.778 m)   Wt 166 lb 12.8 oz (75.7 kg)   SpO2 98%   BMI 23.93 kg/m    Wt Readings from Last 3 Encounters:  11/19/22 166 lb 12.8 oz (75.7 kg)  11/18/22 170 lb (77.1 kg)  10/22/22 166 lb 6.4 oz (75.5 kg)     GEN: Well nourished, well developed in no acute distress NECK: No JVD; No carotid bruits CARDIAC: RRR, no murmurs, rubs, gallops RESPIRATORY:  Clear to auscultation without rales, wheezing or rhonchi  ABDOMEN: Soft, non-tender, non-distended EXTREMITIES:  Right greater than left leg edema; No deformity   ASSESSMENT AND PLAN:   PERICARDIAL EFFUSION:    I will repeat an echocardiogram in 6 months to follow-up.  I did review cytology and fluid analysis.  There are no malignant cells seen.   No growth on cultures.   HTN:  His BP is mildly elevated.  No change in therapy.  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  His left has moderate stenosis and he has follow-up with Dr. Myra Gianotti .       CAROTID STENOSIS:   This has been mild.  No change in therapy.   CKD:    Creat is 2.33.  He is followed by nephrology  ATRIAL FIB: I am going to check a CBC in a couple of months.  In about 3 months he is can wear a 3-day Zio patch.  I will see him back after this.  If he has no further atrial fibrillation I will discontinue the Eliquis.        Signed, Rollene Rotunda, MD

## 2022-11-19 ENCOUNTER — Ambulatory Visit: Payer: Medicare Other | Attending: Cardiology | Admitting: Cardiology

## 2022-11-19 ENCOUNTER — Encounter (HOSPITAL_COMMUNITY): Payer: Self-pay | Admitting: Cardiovascular Disease

## 2022-11-19 VITALS — BP 148/72 | HR 88 | Ht 70.0 in | Wt 166.8 lb

## 2022-11-19 DIAGNOSIS — I3139 Other pericardial effusion (noninflammatory): Secondary | ICD-10-CM

## 2022-11-19 DIAGNOSIS — I701 Atherosclerosis of renal artery: Secondary | ICD-10-CM | POA: Diagnosis not present

## 2022-11-19 DIAGNOSIS — I4819 Other persistent atrial fibrillation: Secondary | ICD-10-CM | POA: Diagnosis not present

## 2022-11-19 DIAGNOSIS — Z79899 Other long term (current) drug therapy: Secondary | ICD-10-CM | POA: Diagnosis not present

## 2022-11-19 DIAGNOSIS — N1832 Chronic kidney disease, stage 3b: Secondary | ICD-10-CM | POA: Diagnosis not present

## 2022-11-19 DIAGNOSIS — I10 Essential (primary) hypertension: Secondary | ICD-10-CM | POA: Insufficient documentation

## 2022-11-19 NOTE — Patient Instructions (Addendum)
Medication Instructions:  Your physician recommends that you continue on your current medications as directed. Please refer to the Current Medication list given to you today.  *If you need a refill on your cardiac medications before your next appointment, please call your pharmacy*   Lab Work: Your physician recommends that you return for labs on 2 months: CBC If you have labs (blood work) drawn today and your tests are completely normal, you will receive your results only by: MyChart Message (if you have MyChart) OR A paper copy in the mail If you have any lab test that is abnormal or we need to change your treatment, we will call you to review the results.   Testing/Procedures: Your physician has requested that you have an echocardiogram in October. Echocardiography is a painless test that uses sound waves to create images of your heart. It provides your doctor with information about the size and shape of your heart and how well your heart's chambers and valves are working. This procedure takes approximately one hour. There are no restrictions for this procedure. Please do NOT wear cologne, perfume, aftershave, or lotions (deodorant is allowed). Please arrive 15 minutes prior to your appointment time.   ZIO XT- Long Term Monitor Instructions in 3 months  Your physician has requested you wear a ZIO patch monitor for 3 days.  This is a single patch monitor. [employer] supplies one patch monitor per enrollment. Additional stickers are not available. Please do not apply patch if you will be having a Nuclear Stress Test,  Echocardiogram, Cardiac CT, MRI, or Chest Xray during the period you would be wearing the  monitor. The patch cannot be worn during these tests. You cannot remove and re-apply the  ZIO XT patch monitor.  Your ZIO patch monitor will be mailed 3 day USPS to your address on file. It may take 3-5 days  to receive your monitor after you have been enrolled.  Once you have received  your monitor, please review the enclosed instructions. Your monitor  has already been registered assigning a specific monitor serial # to you.  Billing and Patient Assistance Program Information  We have supplied [employer] with any of your insurance information on file for billing purposes. [employer] offers a sliding scale Patient Assistance Program for patients that do not have  insurance, or whose insurance does not completely cover the cost of the ZIO monitor.  You must apply for the Patient Assistance Program to qualify for this discounted rate.  To apply, please call [employer] at [phone], select option 4, select option 2, ask to apply for  Patient Assistance Program. Meredeth Ide will ask your household income, and how many people  are in your household. They will quote your out-of-pocket cost based on that information.  [employer] will also be able to set up a 39-month, interest-free payment plan if needed.  Applying the monitor   Shave hair from upper left chest.  Hold abrader disc by orange tab. Rub abrader in 40 strokes over the upper left chest as  indicated in your monitor instructions.  Clean area with 4 enclosed alcohol pads. Let dry.  Apply patch as indicated in monitor instructions. Patch will be placed under collarbone on left  side of chest with arrow pointing upward.  Rub patch adhesive wings for 2 minutes. Remove white label marked "1". Remove the white  label marked "2". Rub patch adhesive wings for 2 additional minutes.  While looking in a mirror, press and release button in center of  patch. A small green light will  flash 3-4 times. This will be your only indicator that the monitor has been turned on.  Do not shower for the first 24 hours. You may shower after the first 24 hours.  Press the button if you feel a symptom. You will hear a small click. Record Date, Time and  Symptom in the Patient Logbook.  When you are ready to remove the patch, follow instructions on the last  2 pages of Patient  Logbook. Stick patch monitor onto the last page of Patient Logbook.  Place Patient Logbook in the blue and white box. Use locking tab on box and tape box closed  securely. The blue and white box has prepaid postage on it. Please place it in the mailbox as  soon as possible. Your physician should have your test results approximately 7 days after the  monitor has been mailed back to [facility].  Call [facility] Customer Care at [phone] if you have questions regarding  your ZIO XT patch monitor. Call them immediately if you see an orange light blinking on your  monitor.  If your monitor falls off in less than 4 days, contact our Monitor department at [phone].  If your monitor becomes loose or falls off after 4 days call [employer] at [phone] for  suggestions on securing your monitor    Follow-Up: At [facility], you and your health needs are our priority.  As part of our continuing mission to provide you with exceptional heart care, we have created designated Provider Care Teams.  These Care Teams include your primary Cardiologist (physician) and Advanced Practice Providers (APPs -  Physician Assistants and Nurse Practitioners) who all work together to provide you with the care you need, when you need it.    Your next appointment:   4 month(s)  Provider:   Rollene Rotunda, MD

## 2022-11-23 NOTE — Progress Notes (Unsigned)
   Compliance DL for OV w/ VS Verified by [facility] 11/23/2022 

## 2022-11-24 ENCOUNTER — Encounter: Payer: Self-pay | Admitting: Pulmonary Disease

## 2022-11-24 ENCOUNTER — Ambulatory Visit: Payer: Medicare Other | Admitting: Pulmonary Disease

## 2022-11-24 ENCOUNTER — Ambulatory Visit (INDEPENDENT_AMBULATORY_CARE_PROVIDER_SITE_OTHER): Payer: Medicare Other | Admitting: Pulmonary Disease

## 2022-11-24 VITALS — BP 161/83 | HR 89 | Ht 70.0 in | Wt 167.4 lb

## 2022-11-24 DIAGNOSIS — G4733 Obstructive sleep apnea (adult) (pediatric): Secondary | ICD-10-CM

## 2022-11-24 NOTE — Progress Notes (Signed)
[facility]  Chief Complaint  Patient presents with   Follow-up    Pt f/u states that CPAP use is going well - no questions or concerns at this time    Past Surgical History:  He  has a past surgical history that includes Renal artery stent (2009); Carotid endarterectomy (2008); small intestine removed (2001); Esophagogastroduodenoscopy; Cholecystectomy (mid 90's); Colonoscopy; Hernia repair; Endarterectomy (06/02/2012); Patch angioplasty (06/02/2012); Colon surgery; Cardiac catheterization (Right, 12/03/2015); Subxyphoid pericardial window (N/A, 09/23/2022); TEE without cardioversion (N/A, 09/23/2022); and Cardioversion (N/A, 11/18/2022).  Past Medical History:  HTN, HLD, PUD, Colon polyp, Renal artery stenosis s/p stent, Gout, GERD, DVT 2008, OA, PAD s/p CEA  Constitutional:  BP (!) 161/83   Pulse 89   Ht 5\' 10"  (1.778 m)   Wt 167 lb 6.4 oz (75.9 kg)   SpO2 95%   BMI 24.02 kg/m   Brief Summary:  James [name] is a 77 y.o. male former smoker with obstructive sleep apnea.      Subjective:   He is here with his son.  Uses CPAP nightly.  Has a full face mask.  Uses nose strip to help with skin irritation.  He has to use the bathroom frequently at night.  He disconnects his mask from the tube to use the bathroom.  He does get air leak sometimes, but doesn't usually wake him up.  Not having sinus congestion.  He had pericardial window and was treated with prednisone.  He had post-op atrial fibrillation and had cardioversion last week.  Not having chest pain, palpitations or dizziness.  Still gets sleepy during the day, and falls asleep when sitting for about 15 to 30 minutes during the day.  Physical Exam:   Appearance - well kempt   ENMT - no sinus tenderness, no oral exudate, no LAN, Mallampati 3 airway, no stridor  Respiratory - equal breath sounds bilaterally, no wheezing or rales  CV - irregular  Ext - no clubbing, no  edema  Skin - no rashes  Psych - normal mood and affect     Sleep Tests:  HST 01/14/16 >> AHI 25.9, SaO2 low 77% ONO with CPAP 05/14/21 >> test time 8 hrs 22 min. Baseline SpO2 95%, low SpO2 84%. Spent 2 min 52 sec with SpO2 < 88%.  Auto CPAP 10/24/22 to 11/22/22 >> used on 22 of 30 nights with average 6 hrs 54 min.  Average AHI 10.9 with median CPAP 8 and 95 th percentile CPAP 10 cm H2O.  Air leak  Cardiac Tests:  Echo 10/27/21 >> EF 55 to 60%, grade 2 DD, large pericardial effusion  Social History:  He  reports that he quit smoking about 22 years ago. His smoking use included cigarettes. He has a 54.00 pack-year smoking history. He has never used smokeless tobacco. He reports that he does not drink alcohol and does not use drugs.  Family History:  His family history includes Clotting disorder in his father; Deep vein thrombosis in his father; Diabetes in his father and sister; Hyperlipidemia in his brother, father, mother, and sister; Hypertension in his brother, father, mother, and sister; Other in his mother and sister; Varicose Veins in his sister.     Assessment/Plan:    Obstructive sleep apnea with excess daytime sleepiness. - he is compliant with CPAP and reports benefit from therapy - he uses [employer] for his DME - continue auto CPAP 7 to 17 cm H2O  Chronic pericarditis, Post operative atrial  fibrillation. - followed by Dr. Angelina Sheriff cardiology and Dr. Andrey Spearman with CT surgery  Renal artery stenosis. - followed by Dr. Coral Else with vascular surgery  CKD 3b. - followed by Dr. Crista Elliot with nephrology  Time Spent Involved in Patient Care on Day of Examination:  16 minutes  Follow up:   Patient Instructions  Follow up in 1 year  Medication List:   Allergies as of 11/24/2022       Reactions   Plavix [clopidogrel Bisulfate] Other (See Comments)   GI Bleed   Uloric [febuxostat] Other (See Comments)   Unknown reaction        Medication  List        Accurate as of November 24, 2022 10:58 AM. If you have any questions, ask your nurse or doctor.          STOP taking these medications    predniSONE 10 MG tablet Commonly known as: DELTASONE Stopped by: Coralyn Helling, MD       TAKE these medications    allopurinol 100 MG tablet Commonly known as: ZYLOPRIM Take 1 tablet (100 mg total) by mouth 2 (two) times daily.   amLODipine-atorvastatin 10-40 MG tablet Commonly known as: CADUET Take 1 tablet by mouth every evening.   apixaban 5 MG Tabs tablet Commonly known as: ELIQUIS Take 1 tablet (5 mg total) by mouth 2 (two) times daily.   aspirin EC 81 MG tablet Take 1 tablet (81 mg total) by mouth daily. Swallow whole.   cloNIDine 0.3 MG tablet Commonly known as: CATAPRES Take 0.3 mg by mouth 3 (three) times daily.   donepezil 5 MG tablet Commonly known as: ARICEPT Take 1 tablet (5 mg total) by mouth at bedtime.   doxazosin 2 MG tablet Commonly known as: CARDURA Take 4 mg by mouth 2 (two) times daily.   ferrous sulfate 325 (65 FE) MG tablet Take 325 mg by mouth in the morning and at bedtime.   furosemide 80 MG tablet Commonly known as: LASIX Take 80 mg by mouth 2 (two) times daily.   metoprolol tartrate 25 MG tablet Commonly known as: LOPRESSOR TAKE 1 TABLET BY MOUTH TWICE A DAY   potassium chloride 10 MEQ CR capsule Commonly known as: MICRO-K Take 10 mEq by mouth daily.   Vitamin D3 50 MCG (2000 UT) Tabs Take 2,000 Units by mouth daily.        Signature:  Coralyn Helling, MD [facility]/Critical Care Pager - [phone] 11/24/2022, 10:58 AM

## 2022-11-24 NOTE — Patient Instructions (Signed)
Follow up in 1 year.

## 2022-11-30 ENCOUNTER — Other Ambulatory Visit: Payer: Self-pay | Admitting: Cardiology

## 2022-12-04 ENCOUNTER — Other Ambulatory Visit: Payer: Self-pay | Admitting: Physician Assistant

## 2022-12-14 DIAGNOSIS — R600 Localized edema: Secondary | ICD-10-CM | POA: Diagnosis not present

## 2022-12-14 DIAGNOSIS — I3139 Other pericardial effusion (noninflammatory): Secondary | ICD-10-CM | POA: Diagnosis not present

## 2022-12-14 DIAGNOSIS — N62 Hypertrophy of breast: Secondary | ICD-10-CM | POA: Diagnosis not present

## 2022-12-14 DIAGNOSIS — R5381 Other malaise: Secondary | ICD-10-CM | POA: Diagnosis not present

## 2022-12-14 DIAGNOSIS — D631 Anemia in chronic kidney disease: Secondary | ICD-10-CM | POA: Diagnosis not present

## 2022-12-14 DIAGNOSIS — N2581 Secondary hyperparathyroidism of renal origin: Secondary | ICD-10-CM | POA: Diagnosis not present

## 2022-12-14 DIAGNOSIS — I129 Hypertensive chronic kidney disease with stage 1 through stage 4 chronic kidney disease, or unspecified chronic kidney disease: Secondary | ICD-10-CM | POA: Diagnosis not present

## 2022-12-14 DIAGNOSIS — N189 Chronic kidney disease, unspecified: Secondary | ICD-10-CM | POA: Diagnosis not present

## 2022-12-14 DIAGNOSIS — E876 Hypokalemia: Secondary | ICD-10-CM | POA: Diagnosis not present

## 2022-12-14 DIAGNOSIS — N184 Chronic kidney disease, stage 4 (severe): Secondary | ICD-10-CM | POA: Diagnosis not present

## 2022-12-14 DIAGNOSIS — I701 Atherosclerosis of renal artery: Secondary | ICD-10-CM | POA: Diagnosis not present

## 2022-12-14 DIAGNOSIS — R5382 Chronic fatigue, unspecified: Secondary | ICD-10-CM | POA: Diagnosis not present

## 2022-12-22 ENCOUNTER — Ambulatory Visit (HOSPITAL_COMMUNITY)
Admission: RE | Admit: 2022-12-22 | Discharge: 2022-12-22 | Disposition: A | Discharge status: Home / Self Care | Payer: Medicare Other | Source: Ambulatory Visit | Attending: Internal Medicine | Admitting: Internal Medicine

## 2022-12-22 VITALS — BP 174/70 | HR 86 | Ht 70.0 in | Wt 173.8 lb

## 2022-12-22 DIAGNOSIS — I4819 Other persistent atrial fibrillation: Secondary | ICD-10-CM | POA: Insufficient documentation

## 2022-12-22 DIAGNOSIS — D6869 Other thrombophilia: Secondary | ICD-10-CM | POA: Diagnosis not present

## 2022-12-22 MED ORDER — APIXABAN 5 MG PO TABS: 5.0000 mg | ORAL_TABLET | Freq: Two times a day (BID) | ORAL | 1 refills | Status: DC

## 2022-12-22 MED ORDER — METOPROLOL TARTRATE 25 MG PO TABS: 25.0000 mg | ORAL_TABLET | Freq: Two times a day (BID) | ORAL | 1 refills | Status: DC

## 2022-12-22 NOTE — Progress Notes (Signed)
Primary Care Physician: [name], [name], MD Referring Physician: [facility] admit f/u  CV surgeon- Dr. Dorris Fetch  Cardiologist: Dr. Dorathy Daft [name] [name] is a 77 y.o. male with a h/o  numerous medical problems including a pericardial effusion, severe thoracic and abdominal aortic atherosclerosis, renal artery stenosis, bilateral carotid endarterectomy, DVT, hypertension, hyperlipidemia, ulcers, reflux, arthritis, and gout.   He has had a longstanding pericardial effusion.  He has chronic peripheral edema.  Recently was in the emergency room on 08/30/2021 with gastrointestinal complaints.  As part of his workup he had a CT of the chest abdomen and pelvis.  It showed a large pericardial effusion.  He left AMA at that time.  In the meantime he has become progressively more short of breath.  Now even with minimal activities.  Also has had worsening of his peripheral edema.  Repeat echocardiogram showed an increase in size of the pericardial effusion with some early signs of tamponade.  He was evaluated by Dr. Dorris Fetch who recommended sub-xiphoid pericardial window creation.  The risks and benefits of the procedure were explained to the patient and he was agreeable to proceed.  He was taken to the OP 09/23/22 and underwent  Sub-xiphoid pericardial window with removal of 1000 ml of bloody serous drainage.  He tolerated the procedure without difficulty and was taken to the PACU in stable condition. He went into a fib with a controlled ventricular rate, bradycardic at times. Cardiology was consulted. Lopressor was decreased to 12.5 mg bid. He will be referred to the a fib clinic at discharge. Because the pericardial drainage was hemorrhagic, it is recommended that anticoagulation not be started for 4-5 days from the day of surgery. He was hypertensive on 03/01 so Amlodipine was restarted (he took Caduet prior to surgery). He has a history of CKD (stage IV) and was on Lasix 80 mg po bid prior to surgery.  Creatinine was monitored and remained stable. He did have anemia (has a history) and he was restarted on oral Ferrous sulfate bid. He has a history of OSA and used CPAP at night. He was surgically stable for transfer from the ICU to [facility] on 02/29 and a bed was available on 03/01. Pericardial drains output gradually decreased and daily chest x rays remained stable. All drains were removed by 03/03.  The patient will be treated with a 30 day course of steroids.  He has been resumed on home Eliquis.  He is ambulating without difficulty. His surgical incisions are free from signs of infection.  He is medically stable for discharge home today.  In the afib clinic 10/06/22, he continues in rate controlled afib. He is now on eliquis  5 mg bid (creatinine  2.60 but less than 85 yo and weight is over 60 kg) since 09/29/22. He is also on 30 days of prednisone. Discussed a cardioversion in 4-6 weeks time after he has satisfied the 3 week uninterrupted DOAC requirement and  has finished steroids. He states he feels tired but he has felt tired for a long time so he cannot discern if  he feels different in afib. He is being compliant with eliquis and metoprolol.   On follow up 10/22/22, he continues in rate controlled afib. He has been compliant with Eliquis 5 mg BID and has not missed any doses. No bleeding concerns. He will finish 1 month prescription of prednisone on 4/5. He feels tired all the time and has been for a long time so unsure if he  feels differently while in afib. Review of records demonstrate he takes ASA daily due to renal artery stent.  On follow up 12/22/22, he is currently in rate controlled Afib. He is s/p DCCV on 11/18/22 with successful conversion to NSR. Seen by Dr. Antoine Poche on 4/25 and was still in NSR. He is not sure when he went back into Afib. He did not feel any difference after cardioversion compared to before cardioversion. His symptom of tiredness was not improved after cardioversion. He has not  missed any doses of anticoagulant.    Today, he denies symptoms of palpitations, chest pain, shortness of breath, orthopnea, PND, lower extremity edema, dizziness, presyncope, syncope, or neurologic sequela. The patient is tolerating medications without difficulties and is otherwise without complaint today.   Past Medical History:  Diagnosis Date   Arthritis    Carotid artery occlusion    Chronic kidney disease    DVT (deep venous thrombosis) (HCC) 2008   GERD (gastroesophageal reflux disease)    takes Protonix daily   Gout    takes Allopurinol daily and Indomethacin prn   H/O renal artery stenosis    H/O: GI bleed    History of blood transfusion    History of colon polyps    History of gastric ulcer    Hyperlipidemia    takes Lipitor daily   Hypertension    takes Metoprolol and Amlodipine daily   Joint pain    Joint swelling    Sleep apnea    Past Surgical History:  Procedure Laterality Date   CARDIOVERSION N/A 11/18/2022   Procedure: CARDIOVERSION;  Surgeon: Chilton Si, MD;  Location: [facility] INVASIVE CV LAB;  Service: Cardiovascular;  Laterality: N/A;   CAROTID ENDARTERECTOMY  2008   right CEA   CHOLECYSTECTOMY  mid 90's   COLON SURGERY     COLONOSCOPY     ENDARTERECTOMY  06/02/2012   Procedure: ENDARTERECTOMY CAROTID;  Surgeon: Nada Libman, MD;  Location: [facility] OR;  Service: Vascular;  Laterality: Left;   ESOPHAGOGASTRODUODENOSCOPY     HERNIA REPAIR     umbilical hernia   PATCH ANGIOPLASTY  06/02/2012   Procedure: PATCH ANGIOPLASTY;  Surgeon: Nada Libman, MD;  Location: [facility] OR;  Service: Vascular;  Laterality: Left;  using Vascu-Guard Patch   PERIPHERAL VASCULAR CATHETERIZATION Right 12/03/2015   Procedure: Peripheral Vascular Balloon Angioplasty;  Surgeon: Nada Libman, MD;  Location: [facility] INVASIVE CV LAB;  Service: Cardiovascular;  Laterality: Right;  RENAL unsuccessful   RENAL ARTERY STENT  2009   Right renal artery stenting-  Right Kidney   small intestine  removed  2001   d/t random gi bleeding   SUBXYPHOID PERICARDIAL WINDOW N/A 09/23/2022   Procedure: SUBXYPHOID PERICARDIAL WINDOW;  Surgeon: Loreli Slot, MD;  Location: [facility] OR;  Service: Thoracic;  Laterality: N/A;   TEE WITHOUT CARDIOVERSION N/A 09/23/2022   Procedure: TRANSESOPHAGEAL ECHOCARDIOGRAM;  Surgeon: Loreli Slot, MD;  Location: [facility] OR;  Service: Thoracic;  Laterality: N/A;    Current Outpatient Medications  Medication Sig Dispense Refill   allopurinol (ZYLOPRIM) 100 MG tablet Take 1 tablet (100 mg total) by mouth 2 (two) times daily.     amLODipine-atorvastatin (CADUET) 10-40 MG tablet Take 1 tablet by mouth every evening.     apixaban (ELIQUIS) 5 MG TABS tablet Take 1 tablet (5 mg total) by mouth 2 (two) times daily. 60 tablet 3   aspirin EC 81 MG tablet Take 1 tablet (81 mg total) by mouth daily. Swallow whole.  30 tablet 12   Cholecalciferol (VITAMIN D3) 50 MCG (2000 UT) TABS Take 2,000 Units by mouth daily.     cloNIDine (CATAPRES) 0.3 MG tablet Take 0.3 mg by mouth 3 (three) times daily.     donepezil (ARICEPT) 5 MG tablet Take 1 tablet (5 mg total) by mouth at bedtime. 90 tablet 3   doxazosin (CARDURA) 2 MG tablet Take 4 mg by mouth 2 (two) times daily.     ferrous sulfate 325 (65 FE) MG tablet Take 325 mg by mouth in the morning and at bedtime.     furosemide (LASIX) 80 MG tablet Take 80 mg by mouth 2 (two) times daily.     metoprolol tartrate (LOPRESSOR) 25 MG tablet TAKE 1 TABLET BY MOUTH TWICE A DAY 180 tablet 1   potassium chloride (MICRO-K) 10 MEQ CR capsule Take 10 mEq by mouth daily.     No current facility-administered medications for this visit.    Allergies  Allergen Reactions   Plavix [Clopidogrel Bisulfate] Other (See Comments)    GI Bleed   Uloric [Febuxostat] Other (See Comments)    Unknown reaction    Social History   Socioeconomic History   Marital status: Married    Spouse name: Melrose   Number of children: 2   Years of  education: 14   Highest education level: Associate degree: occupational, Scientist, product/process development, or vocational program  Occupational History   Occupation: Retired  Tobacco Use   Smoking status: Former    Packs/day: 1.50    Years: 36.00    Additional pack years: 0.00    Total pack years: 54.00    Types: Cigarettes    Quit date: 12/26/1999    Years since quitting: 23.0   Smokeless tobacco: Never  Vaping Use   Vaping Use: Never used  Substance and Sexual Activity   Alcohol use: No    Comment: Quit in 1998   Drug use: No   Sexual activity: Yes    Birth control/protection: None  Other Topics Concern   Not on file  Social History Narrative   Lives with wife.     Social Determinants of Health   Financial Resource Strain: Low Risk  (09/29/2022)   Overall Financial Resource Strain (CARDIA)    Difficulty of Paying Living Expenses: Not very hard  Food Insecurity: No Food Insecurity (09/29/2022)   Hunger Vital Sign    Worried About Running Out of Food in the Last Year: Never true    Ran Out of Food in the Last Year: Never true  Transportation Needs: No Transportation Needs (09/29/2022)   PRAPARE - Administrator, Civil Service (Medical): No    Lack of Transportation (Non-Medical): No  Physical Activity: Inactive (09/21/2019)   Exercise Vital Sign    Days of Exercise per Week: 0 days    Minutes of Exercise per Session: 0 min  Stress: No Stress Concern Present (09/21/2019)   [employer] of Occupational Health - Occupational Stress Questionnaire    Feeling of Stress : Not at all  Social Connections: Socially Integrated (09/21/2019)   Social Connection and Isolation Panel [NHANES]    Frequency of Communication with Friends and Family: More than three times a week    Frequency of Social Gatherings with Friends and Family: More than three times a week    Attends Religious Services: More than 4 times per year    Active Member of [employer] or Organizations: Yes    Attends Banker  Meetings:  More than 4 times per year    Marital Status: Married  Catering manager Violence: Not At Risk (09/23/2022)   Humiliation, Afraid, Rape, and Kick questionnaire    Fear of Current or Ex-Partner: No    Emotionally Abused: No    Physically Abused: No    Sexually Abused: No    Family History  Problem Relation Age of Onset   Hypertension Mother    Hyperlipidemia Mother    Other Mother        varicose veins   Hyperlipidemia Father    Hypertension Father    Diabetes Father    Clotting disorder Father    Deep vein thrombosis Father    Hypertension Sister    Diabetes Sister    Hyperlipidemia Sister    Varicose Veins Sister    Other Sister        Bleeding problems   Hypertension Brother    Hyperlipidemia Brother     ROS- All systems are reviewed and negative except as per the HPI above  Physical Exam: There were no vitals filed for this visit.  Wt Readings from Last 3 Encounters:  11/24/22 75.9 kg  11/19/22 75.7 kg  11/18/22 77.1 kg    Labs: Lab Results  Component Value Date   NA 139 11/11/2022   K 3.8 11/11/2022   CL 97 11/11/2022   CO2 25 11/11/2022   GLUCOSE 128 (H) 11/11/2022   BUN 31 (H) 11/11/2022   CREATININE 2.33 (H) 11/11/2022   CALCIUM 9.0 11/11/2022   MG 2.0 08/30/2022   Lab Results  Component Value Date   INR 1.1 09/16/2022   Lab Results  Component Value Date   CHOL 131 08/21/2022   HDL 68 08/21/2022   LDLCALC 47 08/21/2022   TRIG 82 08/21/2022    GEN- The patient is well appearing, alert and oriented x 3 today.   Head- normocephalic, atraumatic Eyes-  Sclera clear, conjunctiva pink Ears- hearing intact Lungs- Clear to ausculation bilaterally, normal work of breathing Heart- Irregular rate and rhythm, no murmurs, rubs or gallops, PMI not laterally displaced Extremities- no clubbing, cyanosis, or edema MS- no significant deformity or atrophy Skin- no rash or lesion Psych- euthymic mood, full affect Neuro- strength and sensation are  intact   EKG- Vent. rate 86 BPM PR interval * ms QRS duration 100 ms QT/QTcB 428/512 ms P-R-T axes * 128 80 Atrial fibrillation Right axis deviation Possible Anterior infarct , age undetermined Prolonged QT Abnormal ECG When compared with ECG of 18-Nov-2022 15:17, PREVIOUS ECG IS PRESENT  ECHO 09/11/22:  1. Left ventricular ejection fraction, by estimation, is 55 to 60%. The  left ventricle has normal function. The left ventricle has no regional  wall motion abnormalities. There is mild concentric left ventricular  hypertrophy. Left ventricular diastolic  parameters are indeterminate.   2. Right ventricular systolic function is normal. The right ventricular  size is normal. Tricuspid regurgitation signal is inadequate for assessing  PA pressure.   3. Left atrial size was moderately dilated.   4. The mitral valve is normal in structure. No evidence of mitral valve  regurgitation. No evidence of mitral stenosis.   5. The aortic valve is tricuspid. There is mild calcification of the  aortic valve. Aortic valve regurgitation is not visualized. No aortic  stenosis is present.   6. The inferior vena cava is dilated in size with <50% respiratory  variability, suggesting right atrial pressure of 15 mmHg.   7. 28% respirophasic variation of mitral  E inflow velocity (difficult  with irregular rhythm, possible atrial fibrillation). Slight RV diastolic  indentation that looks worse in subcostal views and slight right atrial  indentation. The IVC is dilated. I  suspect this is at least early tamponade physiology. Large pericardial  effusion. The pericardial effusion is circumferential.   8. The patient appears to be in atrial fibrillation.   Assessment and Plan:  1. Persistent afib  He is currently in rate controlled Afib. S/p DCCV on 11/18/22.   Found after cardiac surgery. Continue metoprolol tartrate 25 mg bid   Discussion of options including AAD such as Tikosyn (eligible for  lowest 125 mcg BID dose) or amiodarone, ablation, or rate control. After discussion, patient and son would like to go over options with Dr. Antoine Poche.  2. CHA2DS2VASc  score of at least 4 Continue eliquis 5 mg bid   No missed doses.  3. OSA Continue cpap  4. Pericardial effusion  No clinical signs of tamponade.   F/u afib clinic prn.  Lake Bells, PA-C Afib Clinic [facility] [address] [place], [place] [address] [phone]

## 2023-01-15 ENCOUNTER — Encounter (HOSPITAL_COMMUNITY): Payer: Self-pay | Admitting: Emergency Medicine

## 2023-01-15 ENCOUNTER — Emergency Department (HOSPITAL_COMMUNITY): Payer: Medicare Other

## 2023-01-15 ENCOUNTER — Other Ambulatory Visit: Payer: Self-pay

## 2023-01-15 ENCOUNTER — Inpatient Hospital Stay (HOSPITAL_COMMUNITY)
Admission: EM | Admit: 2023-01-15 | Discharge: 2023-01-19 | DRG: 177 | Disposition: A | Discharge status: Home / Self Care | Payer: Medicare Other | Attending: Internal Medicine | Admitting: Internal Medicine

## 2023-01-15 DIAGNOSIS — Z8249 Family history of ischemic heart disease and other diseases of the circulatory system: Secondary | ICD-10-CM

## 2023-01-15 DIAGNOSIS — I4819 Other persistent atrial fibrillation: Secondary | ICD-10-CM | POA: Diagnosis not present

## 2023-01-15 DIAGNOSIS — R079 Chest pain, unspecified: Secondary | ICD-10-CM | POA: Diagnosis not present

## 2023-01-15 DIAGNOSIS — R0609 Other forms of dyspnea: Secondary | ICD-10-CM | POA: Diagnosis not present

## 2023-01-15 DIAGNOSIS — Z1152 Encounter for screening for COVID-19: Secondary | ICD-10-CM | POA: Diagnosis not present

## 2023-01-15 DIAGNOSIS — J690 Pneumonitis due to inhalation of food and vomit: Principal | ICD-10-CM | POA: Diagnosis present

## 2023-01-15 DIAGNOSIS — Z832 Family history of diseases of the blood and blood-forming organs and certain disorders involving the immune mechanism: Secondary | ICD-10-CM

## 2023-01-15 DIAGNOSIS — I4891 Unspecified atrial fibrillation: Secondary | ICD-10-CM | POA: Diagnosis not present

## 2023-01-15 DIAGNOSIS — Z7901 Long term (current) use of anticoagulants: Secondary | ICD-10-CM | POA: Diagnosis not present

## 2023-01-15 DIAGNOSIS — Z8711 Personal history of peptic ulcer disease: Secondary | ICD-10-CM | POA: Diagnosis not present

## 2023-01-15 DIAGNOSIS — R911 Solitary pulmonary nodule: Secondary | ICD-10-CM

## 2023-01-15 DIAGNOSIS — N184 Chronic kidney disease, stage 4 (severe): Secondary | ICD-10-CM | POA: Diagnosis not present

## 2023-01-15 DIAGNOSIS — Z86718 Personal history of other venous thrombosis and embolism: Secondary | ICD-10-CM

## 2023-01-15 DIAGNOSIS — J9601 Acute respiratory failure with hypoxia: Secondary | ICD-10-CM | POA: Diagnosis not present

## 2023-01-15 DIAGNOSIS — F0390 Unspecified dementia without behavioral disturbance: Secondary | ICD-10-CM | POA: Diagnosis not present

## 2023-01-15 DIAGNOSIS — Z79899 Other long term (current) drug therapy: Secondary | ICD-10-CM

## 2023-01-15 DIAGNOSIS — K529 Noninfective gastroenteritis and colitis, unspecified: Secondary | ICD-10-CM | POA: Diagnosis not present

## 2023-01-15 DIAGNOSIS — M109 Gout, unspecified: Secondary | ICD-10-CM | POA: Diagnosis present

## 2023-01-15 DIAGNOSIS — R1084 Generalized abdominal pain: Secondary | ICD-10-CM

## 2023-01-15 DIAGNOSIS — I129 Hypertensive chronic kidney disease with stage 1 through stage 4 chronic kidney disease, or unspecified chronic kidney disease: Secondary | ICD-10-CM | POA: Diagnosis present

## 2023-01-15 DIAGNOSIS — E876 Hypokalemia: Secondary | ICD-10-CM | POA: Diagnosis present

## 2023-01-15 DIAGNOSIS — Z9049 Acquired absence of other specified parts of digestive tract: Secondary | ICD-10-CM

## 2023-01-15 DIAGNOSIS — Z8719 Personal history of other diseases of the digestive system: Secondary | ICD-10-CM | POA: Diagnosis not present

## 2023-01-15 DIAGNOSIS — R918 Other nonspecific abnormal finding of lung field: Secondary | ICD-10-CM | POA: Diagnosis present

## 2023-01-15 DIAGNOSIS — Z888 Allergy status to other drugs, medicaments and biological substances status: Secondary | ICD-10-CM

## 2023-01-15 DIAGNOSIS — J811 Chronic pulmonary edema: Secondary | ICD-10-CM | POA: Diagnosis not present

## 2023-01-15 DIAGNOSIS — Z83438 Family history of other disorder of lipoprotein metabolism and other lipidemia: Secondary | ICD-10-CM

## 2023-01-15 DIAGNOSIS — R112 Nausea with vomiting, unspecified: Secondary | ICD-10-CM

## 2023-01-15 DIAGNOSIS — J90 Pleural effusion, not elsewhere classified: Secondary | ICD-10-CM | POA: Diagnosis not present

## 2023-01-15 DIAGNOSIS — K219 Gastro-esophageal reflux disease without esophagitis: Secondary | ICD-10-CM | POA: Diagnosis present

## 2023-01-15 DIAGNOSIS — M1990 Unspecified osteoarthritis, unspecified site: Secondary | ICD-10-CM | POA: Diagnosis present

## 2023-01-15 DIAGNOSIS — J189 Pneumonia, unspecified organism: Secondary | ICD-10-CM | POA: Diagnosis present

## 2023-01-15 DIAGNOSIS — Z7982 Long term (current) use of aspirin: Secondary | ICD-10-CM

## 2023-01-15 DIAGNOSIS — Z0189 Encounter for other specified special examinations: Secondary | ICD-10-CM | POA: Diagnosis not present

## 2023-01-15 DIAGNOSIS — E785 Hyperlipidemia, unspecified: Secondary | ICD-10-CM | POA: Diagnosis present

## 2023-01-15 DIAGNOSIS — A419 Sepsis, unspecified organism: Secondary | ICD-10-CM | POA: Diagnosis not present

## 2023-01-15 DIAGNOSIS — Z87891 Personal history of nicotine dependence: Secondary | ICD-10-CM

## 2023-01-15 DIAGNOSIS — E872 Acidosis, unspecified: Secondary | ICD-10-CM | POA: Diagnosis not present

## 2023-01-15 DIAGNOSIS — K5730 Diverticulosis of large intestine without perforation or abscess without bleeding: Secondary | ICD-10-CM | POA: Diagnosis not present

## 2023-01-15 LAB — CBC WITH DIFFERENTIAL/PLATELET
Abs Immature Granulocytes: 0.04 10*3/uL (ref 0.00–0.07)
Basophils Absolute: 0.1 10*3/uL (ref 0.0–0.1)
Basophils Relative: 1 %
Eosinophils Absolute: 0 10*3/uL (ref 0.0–0.5)
Eosinophils Relative: 0 %
HCT: 27.9 % — ABNORMAL LOW (ref 39.0–52.0)
Hemoglobin: 9.4 g/dL — ABNORMAL LOW (ref 13.0–17.0)
Immature Granulocytes: 0 %
Lymphocytes Relative: 5 %
Lymphs Abs: 0.4 10*3/uL — ABNORMAL LOW (ref 0.7–4.0)
MCH: 26.7 pg (ref 26.0–34.0)
MCHC: 33.7 g/dL (ref 30.0–36.0)
MCV: 79.3 fL — ABNORMAL LOW (ref 80.0–100.0)
Monocytes Absolute: 0.5 10*3/uL (ref 0.1–1.0)
Monocytes Relative: 5 %
Neutro Abs: 8.2 10*3/uL — ABNORMAL HIGH (ref 1.7–7.7)
Neutrophils Relative %: 89 %
Platelets: 353 10*3/uL (ref 150–400)
RBC: 3.52 MIL/uL — ABNORMAL LOW (ref 4.22–5.81)
RDW: 15.5 % (ref 11.5–15.5)
WBC: 9.2 10*3/uL (ref 4.0–10.5)
nRBC: 0 % (ref 0.0–0.2)

## 2023-01-15 LAB — URINALYSIS, W/ REFLEX TO CULTURE (INFECTION SUSPECTED)
Bacteria, UA: NONE SEEN
Bilirubin Urine: NEGATIVE
Glucose, UA: NEGATIVE mg/dL
Ketones, ur: 5 mg/dL — AB
Leukocytes,Ua: NEGATIVE
Nitrite: NEGATIVE
Protein, ur: 30 mg/dL — AB
Specific Gravity, Urine: 1.006 (ref 1.005–1.030)
pH: 7 (ref 5.0–8.0)

## 2023-01-15 LAB — COMPREHENSIVE METABOLIC PANEL
ALT: 14 U/L (ref 0–44)
AST: 35 U/L (ref 15–41)
Albumin: 3.4 g/dL — ABNORMAL LOW (ref 3.5–5.0)
Alkaline Phosphatase: 182 U/L — ABNORMAL HIGH (ref 38–126)
Anion gap: 21 — ABNORMAL HIGH (ref 5–15)
BUN: 27 mg/dL — ABNORMAL HIGH (ref 8–23)
CO2: 20 mmol/L — ABNORMAL LOW (ref 22–32)
Calcium: 9 mg/dL (ref 8.9–10.3)
Chloride: 96 mmol/L — ABNORMAL LOW (ref 98–111)
Creatinine, Ser: 2.59 mg/dL — ABNORMAL HIGH (ref 0.61–1.24)
GFR, Estimated: 25 mL/min — ABNORMAL LOW (ref >60)
Glucose, Bld: 190 mg/dL — ABNORMAL HIGH (ref 70–99)
Potassium: 2.4 mmol/L — CL (ref 3.5–5.1)
Sodium: 137 mmol/L (ref 135–145)
Total Bilirubin: 1.7 mg/dL — ABNORMAL HIGH (ref 0.3–1.2)
Total Protein: 7.3 g/dL (ref 6.5–8.1)

## 2023-01-15 LAB — TROPONIN I (HIGH SENSITIVITY)
Troponin I (High Sensitivity): 36 ng/L — ABNORMAL HIGH (ref <18)
Troponin I (High Sensitivity): 34 ng/L — ABNORMAL HIGH (ref <18)

## 2023-01-15 LAB — LACTIC ACID, PLASMA
Lactic Acid, Venous: 4.6 mmol/L (ref 0.5–1.9)
Lactic Acid, Venous: 5.5 mmol/L (ref 0.5–1.9)
Lactic Acid, Venous: 3.6 mmol/L (ref 0.5–1.9)

## 2023-01-15 LAB — PROTIME-INR
INR: 1.8 — ABNORMAL HIGH (ref 0.8–1.2)
Prothrombin Time: 21.1 seconds — ABNORMAL HIGH (ref 11.4–15.2)

## 2023-01-15 LAB — LIPASE, BLOOD: Lipase: 28 U/L (ref 11–51)

## 2023-01-15 LAB — MAGNESIUM: Magnesium: 1.8 mg/dL (ref 1.7–2.4)

## 2023-01-15 MED ORDER — SODIUM CHLORIDE 0.9 % IV BOLUS
1000.0000 mL | Freq: Once | INTRAVENOUS | Status: AC
  Administered 2023-01-15: 1000 mL via INTRAVENOUS

## 2023-01-15 MED ORDER — LACTATED RINGERS IV SOLN: INTRAVENOUS | Status: DC

## 2023-01-15 MED ORDER — MORPHINE SULFATE (PF) 4 MG/ML IV SOLN
4.0000 mg | Freq: Once | INTRAVENOUS | Status: AC
  Administered 2023-01-15: 4 mg via INTRAVENOUS
  Filled 2023-01-15: qty 1

## 2023-01-15 MED ORDER — VANCOMYCIN HCL 750 MG/150ML IV SOLN: 750.0000 mg | INTRAVENOUS | Status: DC

## 2023-01-15 MED ORDER — VANCOMYCIN HCL IN DEXTROSE 1-5 GM/200ML-% IV SOLN: 1000.0000 mg | Freq: Once | INTRAVENOUS | Status: DC

## 2023-01-15 MED ORDER — SODIUM CHLORIDE 0.9 % IV SOLN: 2.0000 g | INTRAVENOUS | Status: DC

## 2023-01-15 MED ORDER — METRONIDAZOLE 500 MG/100ML IV SOLN
500.0000 mg | Freq: Once | INTRAVENOUS | Status: AC
  Administered 2023-01-15: 500 mg via INTRAVENOUS
  Filled 2023-01-15: qty 100

## 2023-01-15 MED ORDER — SODIUM CHLORIDE 0.9 % IV SOLN
2.0000 g | Freq: Once | INTRAVENOUS | Status: AC
  Administered 2023-01-15: 2 g via INTRAVENOUS
  Filled 2023-01-15: qty 12.5

## 2023-01-15 MED ORDER — POTASSIUM CHLORIDE 10 MEQ/100ML IV SOLN
10.0000 meq | INTRAVENOUS | Status: AC
  Administered 2023-01-15 (×2): 10 meq via INTRAVENOUS
  Filled 2023-01-15 (×4): qty 100

## 2023-01-15 MED ORDER — POTASSIUM CHLORIDE 10 MEQ/100ML IV SOLN
10.0000 meq | Freq: Once | INTRAVENOUS | Status: AC
  Administered 2023-01-15: 10 meq via INTRAVENOUS

## 2023-01-15 MED ORDER — LACTATED RINGERS IV BOLUS
1000.0000 mL | Freq: Once | INTRAVENOUS | Status: AC
  Administered 2023-01-15: 1000 mL via INTRAVENOUS

## 2023-01-15 MED ORDER — VANCOMYCIN HCL 1750 MG/350ML IV SOLN
1750.0000 mg | Freq: Once | INTRAVENOUS | Status: AC
  Administered 2023-01-15: 1750 mg via INTRAVENOUS
  Filled 2023-01-15: qty 350

## 2023-01-15 MED ORDER — ONDANSETRON HCL 4 MG/2ML IJ SOLN
4.0000 mg | Freq: Once | INTRAMUSCULAR | Status: AC
  Administered 2023-01-15: 4 mg via INTRAVENOUS
  Filled 2023-01-15: qty 2

## 2023-01-15 NOTE — ED Provider Notes (Signed)
[facility] 3E HF PCU Provider Note  CSN: [id number] Arrival date & time: 01/15/23 1725  Chief Complaint(s) Emesis and Blood Infection  HPI James [name] is a 77 y.o. male history of prior DVT on Eliquis, dementia, hypertension, hyperlipidemia presenting to the emergency department with vomiting.  Patient reports vomiting since yesterday, also complaining of abdominal pain and some chest pain.  Vomiting began first.  No fevers.  Also reporting generalized weakness and some back pain.  Reports his primary pain is abdominal pain.   Past Medical History Past Medical History:  Diagnosis Date   Arthritis    Carotid artery occlusion    Chronic kidney disease    DVT (deep venous thrombosis) (HCC) 2008   GERD (gastroesophageal reflux disease)    takes Protonix daily   Gout    takes Allopurinol daily and Indomethacin prn   H/O renal artery stenosis    H/O: GI bleed    History of blood transfusion    History of colon polyps    History of gastric ulcer    Hyperlipidemia    takes Lipitor daily   Hypertension    takes Metoprolol and Amlodipine daily   Joint pain    Joint swelling    Sleep apnea    Patient Active Problem List   Diagnosis Date Noted   Enteritis 01/16/2023   CAP (community acquired pneumonia) 01/16/2023   Lactic acidosis 01/16/2023   Community acquired pneumonia 01/16/2023   Persistent atrial fibrillation (HCC) 10/22/2022   Hypercoagulable state due to persistent atrial fibrillation (HCC) 10/22/2022   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   Chest pain 08/30/2022   Pulmonary nodules 08/30/2022   CKD (chronic kidney disease) stage 4, GFR 15-29 ml/min (HCC) 08/30/2022   Hypokalemia 08/30/2022   Bilateral carotid artery stenosis 11/17/2021   Fatigue 01/22/2021   Pericardial effusion 10/13/2020   Chronic kidney disease (CKD), stage III (moderate) (HCC) 10/21/2017   OSA on CPAP 03/09/2016   Hypersomnia 01/07/2016   Exertional dyspnea 01/07/2016    Back pain 08/01/2015   Gastroesophageal reflux disease with esophagitis 07/17/2015   Accelerated secondary hypertension 07/17/2015   S/P arterial stent 04/29/2015   Renal artery stenosis (HCC) 12/26/2012   Hypertension 10/11/2012   Hyperlipidemia LDL goal <100 10/11/2012   Gout 10/11/2012   Occlusion and stenosis of carotid artery without mention of cerebral infarction 10/12/2011   Home Medication(s) Prior to Admission medications   Medication Sig Start Date End Date Taking? Authorizing Provider  allopurinol (ZYLOPRIM) 100 MG tablet Take 1 tablet (100 mg total) by mouth 2 (two) times daily. 09/28/22  Yes Barrett, Erin R, PA-C  amLODipine-atorvastatin (CADUET) 10-40 MG tablet Take 1 tablet by mouth every evening. 09/28/22  Yes Barrett, Erin R, PA-C  apixaban (ELIQUIS) 5 MG TABS tablet Take 1 tablet (5 mg total) by mouth 2 (two) times daily. 12/22/22  Yes Eustace Pen, PA-C  aspirin EC 81 MG tablet Take 1 tablet (81 mg total) by mouth daily. Swallow whole. 09/28/22  Yes Barrett, Rae Roam, PA-C  Cholecalciferol (VITAMIN D3) 50 MCG (2000 UT) TABS Take 2,000 Units by mouth daily.   Yes [provider]  cloNIDine (CATAPRES) 0.3 MG tablet Take 0.3 mg by mouth 3 (three) times daily.   Yes [provider]  donepezil (ARICEPT) 5 MG tablet Take 1 tablet (5 mg total) by mouth at bedtime. 02/13/22  Yes [name], [name], MD  doxazosin (CARDURA) 2 MG tablet Take 4 mg by mouth 2 (two) times daily.  08/24/19  Yes [provider]  ferrous sulfate 325 (65 FE) MG tablet Take 325 mg by mouth in the morning and at bedtime.   Yes [provider]  furosemide (LASIX) 80 MG tablet Taking 120 mg by mouth in the am and 80 mg in the evening   Yes [provider]  metoprolol tartrate (LOPRESSOR) 25 MG tablet Take 1 tablet (25 mg total) by mouth 2 (two) times daily. 12/22/22  Yes Eustace Pen, PA-C  potassium chloride (MICRO-K) 10 MEQ CR capsule Take 10 mEq by mouth daily. 12/10/19   Yes [provider]  sertraline (ZOLOFT) 50 MG tablet Take 50 mg by mouth daily.   Yes [provider]                                                                                                                                    Past Surgical History Past Surgical History:  Procedure Laterality Date   CARDIOVERSION N/A 11/18/2022   Procedure: CARDIOVERSION;  Surgeon: Chilton Si, MD;  Location: [facility] INVASIVE CV LAB;  Service: Cardiovascular;  Laterality: N/A;   CAROTID ENDARTERECTOMY  2008   right CEA   CHOLECYSTECTOMY  mid 90's   COLON SURGERY     COLONOSCOPY     ENDARTERECTOMY  06/02/2012   Procedure: ENDARTERECTOMY CAROTID;  Surgeon: Nada Libman, MD;  Location: [facility] OR;  Service: Vascular;  Laterality: Left;   ESOPHAGOGASTRODUODENOSCOPY     HERNIA REPAIR     umbilical hernia   PATCH ANGIOPLASTY  06/02/2012   Procedure: PATCH ANGIOPLASTY;  Surgeon: Nada Libman, MD;  Location: [facility] OR;  Service: Vascular;  Laterality: Left;  using Vascu-Guard Patch   PERIPHERAL VASCULAR CATHETERIZATION Right 12/03/2015   Procedure: Peripheral Vascular Balloon Angioplasty;  Surgeon: Nada Libman, MD;  Location: [facility] INVASIVE CV LAB;  Service: Cardiovascular;  Laterality: Right;  RENAL unsuccessful   RENAL ARTERY STENT  2009   Right renal artery stenting-  Right Kidney   small intestine removed  2001   d/t random gi bleeding   SUBXYPHOID PERICARDIAL WINDOW N/A 09/23/2022   Procedure: SUBXYPHOID PERICARDIAL WINDOW;  Surgeon: Loreli Slot, MD;  Location: [facility] OR;  Service: Thoracic;  Laterality: N/A;   TEE WITHOUT CARDIOVERSION N/A 09/23/2022   Procedure: TRANSESOPHAGEAL ECHOCARDIOGRAM;  Surgeon: Loreli Slot, MD;  Location: [facility] OR;  Service: Thoracic;  Laterality: N/A;   Family History Family History  Problem Relation Age of Onset   Hypertension Mother    Hyperlipidemia Mother    Other Mother        varicose veins   Hyperlipidemia Father    Hypertension  Father    Diabetes Father    Clotting disorder Father    Deep vein thrombosis Father    Hypertension Sister    Diabetes Sister    Hyperlipidemia Sister    Varicose Veins Sister    Other Sister  Bleeding problems   Hypertension Brother    Hyperlipidemia Brother     Social History Social History   Tobacco Use   Smoking status: Former    Packs/day: 1.50    Years: 36.00    Additional pack years: 0.00    Total pack years: 54.00    Types: Cigarettes    Quit date: 12/26/1999    Years since quitting: 23.0   Smokeless tobacco: Never  Vaping Use   Vaping Use: Never used  Substance Use Topics   Alcohol use: No    Comment: Quit in 1998   Drug use: No   Allergies Plavix [clopidogrel bisulfate] and Uloric [febuxostat]  Review of Systems Review of Systems  All other systems reviewed and are negative.   Physical Exam Vital Signs  I have reviewed the triage vital signs BP 135/88 (BP Location: Right Arm)   Pulse (!) 101   Temp 98 F (36.7 C) (Oral)   Resp 18   Ht 5\' 10"  (1.778 m)   Wt 74.7 kg   SpO2 92%   BMI 23.63 kg/m  Physical Exam Vitals and nursing note reviewed.  Constitutional:      General: He is in acute distress.     Appearance: Normal appearance. He is ill-appearing.     Comments: Actively vomiting  HENT:     Mouth/Throat:     Mouth: Mucous membranes are moist.  Eyes:     Conjunctiva/sclera: Conjunctivae normal.  Cardiovascular:     Rate and Rhythm: Normal rate and regular rhythm.     Pulses:          Radial pulses are 2+ on the right side and 2+ on the left side.  Pulmonary:     Effort: Pulmonary effort is normal. No respiratory distress.     Breath sounds: Normal breath sounds.  Abdominal:     General: Abdomen is flat.     Palpations: Abdomen is soft.     Tenderness: There is abdominal tenderness (diffuse). There is guarding.  Musculoskeletal:     Right lower leg: No edema.     Left lower leg: No edema.  Skin:    General: Skin is warm  and dry.     Capillary Refill: Capillary refill takes less than 2 seconds.  Neurological:     Mental Status: He is alert and oriented to person, place, and time. Mental status is at baseline.  Psychiatric:        Mood and Affect: Mood normal.        Behavior: Behavior normal.     ED Results and Treatments Labs (all labs ordered are listed, but only abnormal results are displayed) Labs Reviewed  COMPREHENSIVE METABOLIC PANEL - Abnormal; Notable for the following components:      Result Value   Potassium 2.4 (*)    Chloride 96 (*)    CO2 20 (*)    Glucose, Bld 190 (*)    BUN 27 (*)    Creatinine, Ser 2.59 (*)    Albumin 3.4 (*)    Alkaline Phosphatase 182 (*)    Total Bilirubin 1.7 (*)    GFR, Estimated 25 (*)    Anion gap 21 (*)    All other components within normal limits  LACTIC ACID, PLASMA - Abnormal; Notable for the following components:   Lactic Acid, Venous 4.6 (*)    All other components within normal limits  LACTIC ACID, PLASMA - Abnormal; Notable for the following components:   Lactic Acid,  Venous 5.5 (*)    All other components within normal limits  CBC WITH DIFFERENTIAL/PLATELET - Abnormal; Notable for the following components:   RBC 3.52 (*)    Hemoglobin 9.4 (*)    HCT 27.9 (*)    MCV 79.3 (*)    Neutro Abs 8.2 (*)    Lymphs Abs 0.4 (*)    All other components within normal limits  PROTIME-INR - Abnormal; Notable for the following components:   Prothrombin Time 21.1 (*)    INR 1.8 (*)    All other components within normal limits  URINALYSIS, W/ REFLEX TO CULTURE (INFECTION SUSPECTED) - Abnormal; Notable for the following components:   Hgb urine dipstick MODERATE (*)    Ketones, ur 5 (*)    Protein, ur 30 (*)    All other components within normal limits  LACTIC ACID, PLASMA - Abnormal; Notable for the following components:   Lactic Acid, Venous 3.6 (*)    All other components within normal limits  LACTIC ACID, PLASMA - Abnormal; Notable for the  following components:   Lactic Acid, Venous 2.2 (*)    All other components within normal limits  POTASSIUM - Abnormal; Notable for the following components:   Potassium 2.8 (*)    All other components within normal limits  HEMOGLOBIN A1C - Abnormal; Notable for the following components:   Hgb A1c MFr Bld 6.1 (*)    All other components within normal limits  IRON AND TIBC - Abnormal; Notable for the following components:   Iron 19 (*)    TIBC 238 (*)    Saturation Ratios 8 (*)    All other components within normal limits  RETICULOCYTES - Abnormal; Notable for the following components:   RBC. 3.18 (*)    Immature Retic Fract 17.5 (*)    All other components within normal limits  HEPATIC FUNCTION PANEL - Abnormal; Notable for the following components:   Total Protein 5.8 (*)    Albumin 2.6 (*)    Alkaline Phosphatase 146 (*)    Bilirubin, Direct 0.3 (*)    All other components within normal limits  BASIC METABOLIC PANEL - Abnormal; Notable for the following components:   Glucose, Bld 155 (*)    Creatinine, Ser 2.41 (*)    Calcium 8.2 (*)    GFR, Estimated 27 (*)    All other components within normal limits  CBC - Abnormal; Notable for the following components:   WBC 11.0 (*)    RBC 3.17 (*)    Hemoglobin 8.2 (*)    HCT 25.9 (*)    MCH 25.9 (*)    RDW 15.9 (*)    All other components within normal limits  LACTIC ACID, PLASMA - Abnormal; Notable for the following components:   Lactic Acid, Venous 2.5 (*)    All other components within normal limits  LACTIC ACID, PLASMA - Abnormal; Notable for the following components:   Lactic Acid, Venous 2.2 (*)    All other components within normal limits  GLUCOSE, CAPILLARY - Abnormal; Notable for the following components:   Glucose-Capillary 125 (*)    All other components within normal limits  GLUCOSE, CAPILLARY - Abnormal; Notable for the following components:   Glucose-Capillary 131 (*)    All other components within normal limits   GLUCOSE, CAPILLARY - Abnormal; Notable for the following components:   Glucose-Capillary 117 (*)    All other components within normal limits  CBG MONITORING, ED - Abnormal; Notable for the following  components:   Glucose-Capillary 163 (*)    All other components within normal limits  TROPONIN I (HIGH SENSITIVITY) - Abnormal; Notable for the following components:   Troponin I (High Sensitivity) 36 (*)    All other components within normal limits  TROPONIN I (HIGH SENSITIVITY) - Abnormal; Notable for the following components:   Troponin I (High Sensitivity) 34 (*)    All other components within normal limits  CULTURE, BLOOD (ROUTINE X 2)  CULTURE, BLOOD (ROUTINE X 2)  RESPIRATORY PANEL BY PCR  SARS CORONAVIRUS 2 BY RT PCR  GASTROINTESTINAL PANEL BY PCR, STOOL (REPLACES STOOL CULTURE)  C DIFFICILE QUICK SCREEN W PCR REFLEX    MAGNESIUM  LIPASE, BLOOD  VITAMIN B12  FOLATE  FERRITIN  PROTEIN ELECTROPHORESIS, SERUM  PROCALCITONIN  CBC  COMPREHENSIVE METABOLIC PANEL  TYPE AND SCREEN                                                                                                                          Radiology CT CHEST ABDOMEN PELVIS WO CONTRAST  Result Date: 01/15/2023 CLINICAL DATA:  sepsis, lactic acidosis. abdominal pain EXAM: CT CHEST, ABDOMEN AND PELVIS WITHOUT CONTRAST TECHNIQUE: Multidetector CT imaging of the chest, abdomen and pelvis was performed following the standard protocol without IV contrast. RADIATION DOSE REDUCTION: This exam was performed according to the departmental dose-optimization program which includes automated exposure control, adjustment of the mA and/or kV according to patient size and/or use of iterative reconstruction technique. COMPARISON:  CT chest abdomen pelvis 08/30/2022 FINDINGS: CT CHEST FINDINGS Cardiovascular: Normal heart size. Interval decrease in size of a trace pericardial effusion. The thoracic aorta is normal in caliber. Severe  atherosclerotic plaque of the thoracic aorta. Four-vessel coronary artery calcifications. The main pulmonary artery is normal in caliber. Mediastinum/Nodes: No enlarged mediastinal, hilar, or axillary lymph nodes. Thyroid gland, trachea, and esophagus demonstrate no significant findings. Lungs/Pleura: Hyperinflation of the lungs with biapical trace paraseptal emphysematous changes. Biapical pleural/pulmonary scarring. Diffuse bronchial wall thickening. Peripheral left upper lobe 9 x 8 mm ground-glass airspace opacity (4:22). Few vague scattered peribronchovascular ground-glass airspace opacities within the right upper lobe. No pulmonary mass. Bilateral trace pleural effusions. No pneumothorax. Musculoskeletal: Bilateral gynecomastia. No suspicious lytic or blastic osseous lesions. No acute displaced fracture. Multilevel degenerative changes of the spine. CT ABDOMEN PELVIS FINDINGS Hepatobiliary: No focal liver abnormality. Status post cholecystectomy. No biliary dilatation. Pancreas: No focal lesion. Normal pancreatic contour. No surrounding inflammatory changes. No main pancreatic ductal dilatation. Spleen: Normal in size without focal abnormality. Adrenals/Urinary Tract: No adrenal nodule bilaterally. No nephrolithiasis and no hydronephrosis. 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 Subcentimeter hypodensities are too small to characterize-no further follow-up indicated. No ureterolithiasis or hydroureter. The urinary bladder is unremarkable. Stomach/Bowel: Stomach is within normal limits. No evidence of bowel wall thickening or dilatation. Colonic diverticulosis. Appendix appears normal. Vascular/Lymphatic: No abdominal aorta or iliac aneurysm. Severe atherosclerotic plaque of  the aorta and its branches. No abdominal, pelvic, or inguinal lymphadenopathy. Reproductive: Prostate is unremarkable. Other: No intraperitoneal free  fluid. No intraperitoneal free gas. No organized fluid collection. Musculoskeletal: No abdominal wall hernia or abnormality. No suspicious lytic or blastic osseous lesions. No acute displaced fracture. Multilevel degenerative changes of the spine. IMPRESSION: 1. Diffuse bronchial wall thickening with associated persistent vague scattered peribronchovascular ground-glass airspace opacities within the right upper lobe that may represent infection/inflammation. Interval increase in size of a peripheral left upper lobe 9 mm (from 7 mm) ground-glass pulmonary nodule. Adenocarcinoma is not excluded. Additional imaging evaluation or consultation with Pulmonology or Thoracic Surgery recommended. 2. Bilateral trace pleural effusions. 3. Colonic diverticulosis with no acute diverticulitis. 4. Aortic Atherosclerosis (ICD10-I70.0) including four-vessel coronary calcification. Emphysema (ICD10-J43.9). Electronically Signed   By: Tish Frederickson M.D.   On: 01/15/2023 22:52    Pertinent labs & imaging results that were available during my care of the patient were reviewed by me and considered in my medical decision making (see MDM for details).  Medications Ordered in ED Medications  potassium chloride 10 mEq in 100 mL IVPB (10 mEq Intravenous Not Given 01/16/23 0039)  azithromycin (ZITHROMAX) 500 mg in sodium chloride 0.9 % 250 mL IVPB (has no administration in time range)  insulin aspart (novoLOG) injection 0-5 Units ( Subcutaneous Not Given 01/16/23 0125)  insulin aspart (novoLOG) injection 0-9 Units ( Subcutaneous Not Given 01/16/23 1647)  Ampicillin-Sulbactam (UNASYN) 3 g in sodium chloride 0.9 % 100 mL IVPB (3 g Intravenous New Bag/Given 01/16/23 0944)  aspirin EC tablet 81 mg (81 mg Oral Given 01/16/23 0937)  sertraline (ZOLOFT) tablet 50 mg (50 mg Oral Given 01/16/23 0938)  cloNIDine (CATAPRES) tablet 0.3 mg (0.3 mg Oral Given 01/16/23 1725)  doxazosin (CARDURA) tablet 4 mg (4 mg Oral Given 01/16/23 0937)   metoprolol tartrate (LOPRESSOR) tablet 25 mg (25 mg Oral Given 01/16/23 0938)  furosemide (LASIX) tablet 80 mg (80 mg Oral Given 01/16/23 1725)  allopurinol (ZYLOPRIM) tablet 100 mg (100 mg Oral Given 01/16/23 0938)  donepezil (ARICEPT) tablet 5 mg (5 mg Oral Given 01/16/23 0139)  cholecalciferol (VITAMIN D3) 25 MCG (1000 UNIT) tablet 2,000 Units (2,000 Units Oral Given 01/16/23 0938)  acetaminophen (TYLENOL) tablet 650 mg (has no administration in time range)    Or  acetaminophen (TYLENOL) suppository 650 mg (has no administration in time range)  polyethylene glycol (MIRALAX / GLYCOLAX) packet 17 g (has no administration in time range)  sodium chloride flush (NS) 0.9 % injection 3 mL (3 mLs Intravenous Given 01/16/23 0947)  amLODipine (NORVASC) tablet 10 mg (10 mg Oral Given 01/16/23 1725)  atorvastatin (LIPITOR) tablet 40 mg (40 mg Oral Given 01/16/23 1725)  apixaban (ELIQUIS) tablet 5 mg (5 mg Oral Given 01/16/23 1244)  sodium chloride 0.9 % bolus 1,000 mL (0 mLs Intravenous Stopped 01/15/23 1954)  morphine (PF) 4 MG/ML injection 4 mg (4 mg Intravenous Given 01/15/23 1828)  ondansetron (ZOFRAN) injection 4 mg (4 mg Intravenous Given 01/15/23 1828)  ceFEPIme (MAXIPIME) 2 g in sodium chloride 0.9 % 100 mL IVPB (0 g Intravenous Stopped 01/15/23 1902)  metroNIDAZOLE (FLAGYL) IVPB 500 mg (0 mg Intravenous Stopped 01/15/23 2010)  vancomycin (VANCOREADY) IVPB 1750 mg/350 mL (0 mg Intravenous Stopped 01/15/23 2122)  sodium chloride 0.9 % bolus 1,000 mL (0 mLs Intravenous Stopped 01/15/23 2157)  potassium chloride 10 mEq in 100 mL IVPB (0 mEq Intravenous Stopped 01/15/23 2250)  lactated ringers bolus 1,000 mL (0 mLs Intravenous Stopped 01/16/23 0249)  azithromycin (ZITHROMAX) 500 mg in sodium chloride 0.9 % 250 mL IVPB (0 mg Intravenous Stopped 01/16/23 0249)  potassium chloride (KLOR-CON) packet 60 mEq (60 mEq Oral Given 01/16/23 0056)                                                                                                                                      Procedures Procedures  (including critical care time)  Medical Decision Making / ED Course   MDM:  77 year old male presenting to the emergency department with vomiting.  Vital signs notable for tachycardia, patient A-fib with RVR.  Also with tachypnea.  Patient ill-appearing with diffuse abdominal tenderness.  Suspect intra-abdominal process, possible infection.  Patient lactate 4.6, code sepsis was activated and patient covered with broad-spectrum antibiotics.  CT scanner here is broken, obtain 1 view abdominal x-ray without evidence of free air. CXR clear. Patient will need CT imaging.  Differential includes pancreatitis, cholecystitis, obstruction, perforation, abscess.  Patient is also complaining of some chest pain initially, particularly in the setting of vomiting, less likely cardiopulmonary pathology, chest x-ray clear, cardiomediastinal silhouette appears stable without widening, 2+ radial pulses bilaterally, doubt dissection.  He reports that his chest pain resolved with cessation of nausea but he continues to have abdominal pain and tenderness.  Discussed with Dr. Estell Harpin at [facility], patient will be accepted for transport.  He is receiving fluid bolus for lactic acidosis as well as broad-spectrum antibiotics.      Additional history obtained: -Additional history obtained from family -External records from outside source obtained and reviewed including: Chart review including previous notes, labs, imaging, consultation notes including prior visits for similar   Lab Tests: -I ordered, reviewed, and interpreted labs.   The pertinent results include:   Labs Reviewed  COMPREHENSIVE METABOLIC PANEL - Abnormal; Notable for the following components:      Result Value   Potassium 2.4 (*)    Chloride 96 (*)    CO2 20 (*)    Glucose, Bld 190 (*)    BUN 27 (*)    Creatinine, Ser 2.59 (*)    Albumin 3.4 (*)    Alkaline Phosphatase  182 (*)    Total Bilirubin 1.7 (*)    GFR, Estimated 25 (*)    Anion gap 21 (*)    All other components within normal limits  LACTIC ACID, PLASMA - Abnormal; Notable for the following components:   Lactic Acid, Venous 4.6 (*)    All other components within normal limits  LACTIC ACID, PLASMA - Abnormal; Notable for the following components:   Lactic Acid, Venous 5.5 (*)    All other components within normal limits  CBC WITH DIFFERENTIAL/PLATELET - Abnormal; Notable for the following components:   RBC 3.52 (*)    Hemoglobin 9.4 (*)    HCT 27.9 (*)    MCV 79.3 (*)    Neutro Abs 8.2 (*)    Lymphs  Abs 0.4 (*)    All other components within normal limits  PROTIME-INR - Abnormal; Notable for the following components:   Prothrombin Time 21.1 (*)    INR 1.8 (*)    All other components within normal limits  URINALYSIS, W/ REFLEX TO CULTURE (INFECTION SUSPECTED) - Abnormal; Notable for the following components:   Hgb urine dipstick MODERATE (*)    Ketones, ur 5 (*)    Protein, ur 30 (*)    All other components within normal limits  LACTIC ACID, PLASMA - Abnormal; Notable for the following components:   Lactic Acid, Venous 3.6 (*)    All other components within normal limits  LACTIC ACID, PLASMA - Abnormal; Notable for the following components:   Lactic Acid, Venous 2.2 (*)    All other components within normal limits  POTASSIUM - Abnormal; Notable for the following components:   Potassium 2.8 (*)    All other components within normal limits  HEMOGLOBIN A1C - Abnormal; Notable for the following components:   Hgb A1c MFr Bld 6.1 (*)    All other components within normal limits  IRON AND TIBC - Abnormal; Notable for the following components:   Iron 19 (*)    TIBC 238 (*)    Saturation Ratios 8 (*)    All other components within normal limits  RETICULOCYTES - Abnormal; Notable for the following components:   RBC. 3.18 (*)    Immature Retic Fract 17.5 (*)    All other components  within normal limits  HEPATIC FUNCTION PANEL - Abnormal; Notable for the following components:   Total Protein 5.8 (*)    Albumin 2.6 (*)    Alkaline Phosphatase 146 (*)    Bilirubin, Direct 0.3 (*)    All other components within normal limits  BASIC METABOLIC PANEL - Abnormal; Notable for the following components:   Glucose, Bld 155 (*)    Creatinine, Ser 2.41 (*)    Calcium 8.2 (*)    GFR, Estimated 27 (*)    All other components within normal limits  CBC - Abnormal; Notable for the following components:   WBC 11.0 (*)    RBC 3.17 (*)    Hemoglobin 8.2 (*)    HCT 25.9 (*)    MCH 25.9 (*)    RDW 15.9 (*)    All other components within normal limits  LACTIC ACID, PLASMA - Abnormal; Notable for the following components:   Lactic Acid, Venous 2.5 (*)    All other components within normal limits  LACTIC ACID, PLASMA - Abnormal; Notable for the following components:   Lactic Acid, Venous 2.2 (*)    All other components within normal limits  GLUCOSE, CAPILLARY - Abnormal; Notable for the following components:   Glucose-Capillary 125 (*)    All other components within normal limits  GLUCOSE, CAPILLARY - Abnormal; Notable for the following components:   Glucose-Capillary 131 (*)    All other components within normal limits  GLUCOSE, CAPILLARY - Abnormal; Notable for the following components:   Glucose-Capillary 117 (*)    All other components within normal limits  CBG MONITORING, ED - Abnormal; Notable for the following components:   Glucose-Capillary 163 (*)    All other components within normal limits  TROPONIN I (HIGH SENSITIVITY) - Abnormal; Notable for the following components:   Troponin I (High Sensitivity) 36 (*)    All other components within normal limits  TROPONIN I (HIGH SENSITIVITY) - Abnormal; Notable for the following components:  Troponin I (High Sensitivity) 34 (*)    All other components within normal limits  CULTURE, BLOOD (ROUTINE X 2)  CULTURE, BLOOD  (ROUTINE X 2)  RESPIRATORY PANEL BY PCR  SARS CORONAVIRUS 2 BY RT PCR  GASTROINTESTINAL PANEL BY PCR, STOOL (REPLACES STOOL CULTURE)  C DIFFICILE QUICK SCREEN W PCR REFLEX    MAGNESIUM  LIPASE, BLOOD  VITAMIN B12  FOLATE  FERRITIN  PROTEIN ELECTROPHORESIS, SERUM  PROCALCITONIN  CBC  COMPREHENSIVE METABOLIC PANEL  TYPE AND SCREEN    Notable for lactic acidosis, hypokalemia, nonspecific LFT/bilirubin abnormalities, anion gap, CKD, anemia, mild troponin elevation likely demand related   EKG   EKG Interpretation  Date/Time:  Friday January 15 2023 17:43:57 EDT Ventricular Rate:  141 PR Interval:    QRS Duration: 98 QT Interval:  267 QTC Calculation: 403 R Axis:   145 Text Interpretation: Atrial fibrillation Ventricular premature complex Abnormal lateral Q waves Anterior infarct, old Repolarization abnormality, prob rate related Confirmed by Alvino Blood ([id number]) on 01/15/2023 5:54:49 PM         Imaging Studies ordered: I ordered imaging studies including CXR, XR abd On my interpretation imaging demonstrates no acute process I independently visualized and interpreted imaging. I agree with the radiologist interpretation   Medicines ordered and prescription drug management: Meds ordered this encounter  Medications   sodium chloride 0.9 % bolus 1,000 mL   morphine (PF) 4 MG/ML injection 4 mg   ondansetron (ZOFRAN) injection 4 mg   DISCONTD: lactated ringers infusion   ceFEPIme (MAXIPIME) 2 g in sodium chloride 0.9 % 100 mL IVPB    Order Specific Question:   Antibiotic Indication:    Answer:   Other Indication (list below)    Order Specific Question:   Other Indication:    Answer:   Unknown source   metroNIDAZOLE (FLAGYL) IVPB 500 mg    Order Specific Question:   Antibiotic Indication:    Answer:   Other Indication (list below)    Order Specific Question:   Other Indication:    Answer:   Unknown source   DISCONTD: vancomycin (VANCOCIN) IVPB 1000 mg/200 mL premix     Order Specific Question:   Indication:    Answer:   Other Indication (list below)    Order Specific Question:   Other Indication:    Answer:   Unknown source   vancomycin (VANCOREADY) IVPB 1750 mg/350 mL    Order Specific Question:   Indication:    Answer:   Sepsis   DISCONTD: vancomycin (VANCOREADY) IVPB 750 mg/150 mL    Order Specific Question:   Indication:    Answer:   Sepsis   DISCONTD: ceFEPIme (MAXIPIME) 2 g in sodium chloride 0.9 % 100 mL IVPB    Order Specific Question:   Antibiotic Indication:    Answer:   Sepsis   potassium chloride 10 mEq in 100 mL IVPB   sodium chloride 0.9 % bolus 1,000 mL   potassium chloride 10 mEq in 100 mL IVPB   lactated ringers bolus 1,000 mL   azithromycin (ZITHROMAX) 500 mg in sodium chloride 0.9 % 250 mL IVPB    Order Specific Question:   Antibiotic Indication:    Answer:   CAP   azithromycin (ZITHROMAX) 500 mg in sodium chloride 0.9 % 250 mL IVPB    Order Specific Question:   Antibiotic Indication:    Answer:   CAP   potassium chloride (KLOR-CON) packet 60 mEq   insulin aspart (novoLOG) injection 0-5 Units  Order Specific Question:   Correction coverage:    Answer:   HS scale    Order Specific Question:   CBG < 70:    Answer:   Implement Hypoglycemia Standing Orders and refer to Hypoglycemia Standing Orders sidebar report    Order Specific Question:   CBG 70 - 120:    Answer:   0 units    Order Specific Question:   CBG 121 - 150:    Answer:   0 units    Order Specific Question:   CBG 151 - 200:    Answer:   0 units    Order Specific Question:   CBG 201 - 250:    Answer:   2 units    Order Specific Question:   CBG 251 - 300:    Answer:   3 units    Order Specific Question:   CBG 301 - 350:    Answer:   4 units    Order Specific Question:   CBG 351 - 400:    Answer:   5 units    Order Specific Question:   CBG > 400    Answer:   call MD and obtain STAT lab verification   insulin aspart (novoLOG) injection 0-9 Units    Order  Specific Question:   Correction coverage:    Answer:   Sensitive (thin, NPO, renal)    Order Specific Question:   CBG < 70:    Answer:   Implement Hypoglycemia Standing Orders and refer to Hypoglycemia Standing Orders sidebar report    Order Specific Question:   CBG 70 - 120:    Answer:   0 units    Order Specific Question:   CBG 121 - 150:    Answer:   1 unit    Order Specific Question:   CBG 151 - 200:    Answer:   2 units    Order Specific Question:   CBG 201 - 250:    Answer:   3 units    Order Specific Question:   CBG 251 - 300:    Answer:   5 units    Order Specific Question:   CBG 301 - 350:    Answer:   7 units    Order Specific Question:   CBG 351 - 400    Answer:   9 units    Order Specific Question:   CBG > 400    Answer:   call MD and obtain STAT lab verification   Ampicillin-Sulbactam (UNASYN) 3 g in sodium chloride 0.9 % 100 mL IVPB    Order Specific Question:   Antibiotic Indication:    Answer:   Aspiration Pneumonia   aspirin EC tablet 81 mg    Swallow whole.     sertraline (ZOLOFT) tablet 50 mg   cloNIDine (CATAPRES) tablet 0.3 mg   doxazosin (CARDURA) tablet 4 mg   metoprolol tartrate (LOPRESSOR) tablet 25 mg   DISCONTD: amLODipine-atorvastatin (CADUET) 10-40 MG per tablet 1 tablet   furosemide (LASIX) tablet 80 mg    Taking 120 mg by mouth in the am and 80 mg in the evening     allopurinol (ZYLOPRIM) tablet 100 mg   donepezil (ARICEPT) tablet 5 mg   cholecalciferol (VITAMIN D3) 25 MCG (1000 UNIT) tablet 2,000 Units   OR Linked Order Group    acetaminophen (TYLENOL) tablet 650 mg    acetaminophen (TYLENOL) suppository 650 mg   polyethylene glycol (MIRALAX / GLYCOLAX) packet 17 g  sodium chloride flush (NS) 0.9 % injection 3 mL   amLODipine (NORVASC) tablet 10 mg   atorvastatin (LIPITOR) tablet 40 mg   apixaban (ELIQUIS) tablet 5 mg    -I have reviewed the patients home medicines and have made adjustments as needed   Consultations Obtained: I  requested consultation with the ER physician at [facility],  and discussed lab and imaging findings as well as pertinent plan - they recommend: transfer   Cardiac Monitoring: The patient was maintained on a cardiac monitor.  I personally viewed and interpreted the cardiac monitored which showed an underlying rhythm of: afib  Social Determinants of Health:  Diagnosis or treatment significantly limited by social determinants of health: former smoker   Reevaluation: After the interventions noted above, I reevaluated the patient and found that their symptoms have improved  Co morbidities that complicate the patient evaluation  Past Medical History:  Diagnosis Date   Arthritis    Carotid artery occlusion    Chronic kidney disease    DVT (deep venous thrombosis) (HCC) 2008   GERD (gastroesophageal reflux disease)    takes Protonix daily   Gout    takes Allopurinol daily and Indomethacin prn   H/O renal artery stenosis    H/O: GI bleed    History of blood transfusion    History of colon polyps    History of gastric ulcer    Hyperlipidemia    takes Lipitor daily   Hypertension    takes Metoprolol and Amlodipine daily   Joint pain    Joint swelling    Sleep apnea       Dispostion: Disposition decision including need for hospitalization was considered, and patient transferred.    Final Clinical Impression(s) / ED Diagnoses Final diagnoses:  Lactic acidosis  Generalized abdominal pain  Nausea and vomiting, unspecified vomiting type  Atrial fibrillation with rapid ventricular response (HCC)     This chart was dictated using voice recognition software.  Despite best efforts to proofread,  errors can occur which can change the documentation meaning.    Lonell Grandchild, MD 01/16/23 [phone]

## 2023-01-15 NOTE — ED Notes (Signed)
Report given to transport RN  Second bag of Potassium given to RN when first is completed

## 2023-01-15 NOTE — ED Triage Notes (Addendum)
Pt via POV c/o emesis since yesterday with chest pain since around 3pm this afternoon. Estimates 5 episodes of emesis today. Pt rates mid chest pain 9/10 radiating to back. PMH includes stage III renal failure, a fib, pericardial effusion. Pt is tachypeic and ill-appearing in triage. Pt takes thinners; no known hx of AAA

## 2023-01-15 NOTE — Progress Notes (Signed)
Notified provider of need to order repeat lactic acid. ° °

## 2023-01-15 NOTE — ED Provider Notes (Signed)
Medical Decision Making: Care of patient assumed from Dr. Suezanne Jacquet. See their note for further details.  Briefly, 77 y.o. male with PMH/PSH as below.  Past Medical History:  Diagnosis Date   Arthritis    Carotid artery occlusion    Chronic kidney disease    DVT (deep venous thrombosis) (HCC) 2008   GERD (gastroesophageal reflux disease)    takes Protonix daily   Gout    takes Allopurinol daily and Indomethacin prn   H/O renal artery stenosis    H/O: GI bleed    History of blood transfusion    History of colon polyps    History of gastric ulcer    Hyperlipidemia    takes Lipitor daily   Hypertension    takes Metoprolol and Amlodipine daily   Joint pain    Joint swelling    Sleep apnea    Past Surgical History:  Procedure Laterality Date   CARDIOVERSION N/A 11/18/2022   Procedure: CARDIOVERSION;  Surgeon: Chilton Si, MD;  Location: [facility] INVASIVE CV LAB;  Service: Cardiovascular;  Laterality: N/A;   CAROTID ENDARTERECTOMY  2008   right CEA   CHOLECYSTECTOMY  mid 90's   COLON SURGERY     COLONOSCOPY     ENDARTERECTOMY  06/02/2012   Procedure: ENDARTERECTOMY CAROTID;  Surgeon: Nada Libman, MD;  Location: [facility] OR;  Service: Vascular;  Laterality: Left;   ESOPHAGOGASTRODUODENOSCOPY     HERNIA REPAIR     umbilical hernia   PATCH ANGIOPLASTY  06/02/2012   Procedure: PATCH ANGIOPLASTY;  Surgeon: Nada Libman, MD;  Location: [facility] OR;  Service: Vascular;  Laterality: Left;  using Vascu-Guard Patch   PERIPHERAL VASCULAR CATHETERIZATION Right 12/03/2015   Procedure: Peripheral Vascular Balloon Angioplasty;  Surgeon: Nada Libman, MD;  Location: [facility] INVASIVE CV LAB;  Service: Cardiovascular;  Laterality: Right;  RENAL unsuccessful   RENAL ARTERY STENT  2009   Right renal artery stenting-  Right Kidney   small intestine removed  2001   d/t random gi bleeding   SUBXYPHOID PERICARDIAL WINDOW N/A 09/23/2022   Procedure: SUBXYPHOID PERICARDIAL WINDOW;  Surgeon: Loreli Slot,  MD;  Location: [facility] OR;  Service: Thoracic;  Laterality: N/A;   TEE WITHOUT CARDIOVERSION N/A 09/23/2022   Procedure: TRANSESOPHAGEAL ECHOCARDIOGRAM;  Surgeon: Loreli Slot, MD;  Location: [facility] OR;  Service: Thoracic;  Laterality: N/A;      Patient presents with concerns of sepsis with abdominal and chest pain.  Has history of pericardial effusion, DVT on Eliquis.  Has been vomiting since yesterday and complaining of abdominal pain with some chest pain.  Status post vancomycin, cefepime, Flagyl.  Status post 2 L of fluid.  Potassium is low getting repletion.  Unable to obtain CT scan at their facility therefore was sent for further imaging.  Current plan is as follows: CT and admit  MDM:  -Reviewed and confirmed nursing documentation for past medical history, family history, social history. -Vital signs stable. -Upon reevaluation, patient is having emesis and complaining of abdominal pain.  Endorses his chest pain has improved.  He has generalized abdominal pain.  Labs show potassium of 2.4.  Lactic acid is elevated and uptrending from 4.6-5.5.  Troponins are elevated but flat at 36 and 34 with a delta of 2.  UA without UTI.  Creatinine is baseline kidney function. -Will proceed with CT chest, abdomen, pelvis.  Given CKD obtained without contrast.  CT shows no acute intra-abdominal abnormality.  Does show evidence of pneumonia.  Given the  patient's sepsis likely secondary for pneumonia will proceed with admission to medicine.  Patient was discussed with hospitalist will admit for further workup and management. -Patient had no acute events while under my care in the emergency department.     The plan for this patient was discussed with Dr. Lockie Mola, who voiced agreement and who oversaw evaluation and treatment of this patient.  Marta Lamas, MD Emergency Medicine, PGY-3  Note: Dragon medical dictation software was used in the creation of this note.      Chase Caller,  MD 01/15/23 [id number]    Virgina Norfolk, DO 01/16/23 [id number]

## 2023-01-15 NOTE — ED Triage Notes (Signed)
Pt via EMS transfer from [facility] to get CT scan done and probably admission. Report is septic with lactic acid at 7 and K+ 2.4. Hx of dementia. Oriented to self and place. Has had multiple episodes of vomiting since yesterday with abd pain.

## 2023-01-15 NOTE — Progress Notes (Signed)
Elink monitoring for the code sepsis protocol.  

## 2023-01-15 NOTE — Progress Notes (Signed)
Pharmacy Antibiotic Note  TRENTEN WATCHMAN is a 77 y.o. male admitted on 01/15/2023 with sepsis.  Pharmacy has been consulted for vancomycin and cefepime dosing.  Patient afebrile, wbc normal, lactic acid 4.6. Scr elevated at 2.59 which appears to be close to his normal baseline. Will use traditional dosing for now.   Plan: Vancomycin 1750 x1 then 750mg  every 24 hours.  Goal trough 15-20 mcg/mL. Cefepime 2g q24 hours  Height: 5\' 10"  (177.8 cm) Weight: 77.1 kg (170 lb) IBW/kg (Calculated) : 73  Temp (24hrs), Avg:98.2 F (36.8 C), Min:97.8 F (36.6 C), Max:98.6 F (37 C)  Recent Labs  Lab 01/15/23 1806  WBC 9.2  CREATININE 2.59*  LATICACIDVEN 4.6*    Estimated Creatinine Clearance: 24.7 mL/min (A) (by C-G formula based on SCr of 2.59 mg/dL (H)).    Allergies  Allergen Reactions   Plavix [Clopidogrel Bisulfate] Other (See Comments)    GI Bleed   Uloric [Febuxostat] Other (See Comments)    Unknown reaction    Thank you for allowing pharmacy to be a part of this patient's care.  Sheppard Coil PharmD., BCPS Clinical Pharmacist 01/15/2023 6:57 PM

## 2023-01-15 NOTE — ED Notes (Signed)
Notified EDP of pt presentation.

## 2023-01-15 NOTE — ED Notes (Signed)
Critical results receive from lab technician. Lactic 3.6. Primary RN and MD notified

## 2023-01-15 NOTE — ED Notes (Signed)
Pt came to ED for n/v/d starting yesterday. Dry heaving during triage, recd zofran Notable bruising and swelling to LUE and LLE Pt has dementia but is usually some what with it per family Pt is shaking and repeating "it is going off again" Initially pt complains of LLE pain Pain free after morphine  Increase lactic acid Septic work up completed Mag WNL Potassium 2.4, replacing via IV Rectal temp 98.56F pt is hot to touch and clammy  HR 120's-140's Increased troponin   Family at bedside

## 2023-01-15 NOTE — ED Notes (Signed)
Report given Candice RN

## 2023-01-16 ENCOUNTER — Encounter (HOSPITAL_COMMUNITY): Payer: Medicare Other

## 2023-01-16 DIAGNOSIS — F0390 Unspecified dementia without behavioral disturbance: Secondary | ICD-10-CM | POA: Diagnosis present

## 2023-01-16 DIAGNOSIS — J189 Pneumonia, unspecified organism: Secondary | ICD-10-CM | POA: Diagnosis present

## 2023-01-16 DIAGNOSIS — J9601 Acute respiratory failure with hypoxia: Secondary | ICD-10-CM | POA: Diagnosis not present

## 2023-01-16 DIAGNOSIS — R0609 Other forms of dyspnea: Secondary | ICD-10-CM | POA: Diagnosis not present

## 2023-01-16 DIAGNOSIS — Z79899 Other long term (current) drug therapy: Secondary | ICD-10-CM | POA: Diagnosis not present

## 2023-01-16 DIAGNOSIS — K529 Noninfective gastroenteritis and colitis, unspecified: Secondary | ICD-10-CM | POA: Diagnosis present

## 2023-01-16 DIAGNOSIS — K219 Gastro-esophageal reflux disease without esophagitis: Secondary | ICD-10-CM | POA: Diagnosis present

## 2023-01-16 DIAGNOSIS — Z86718 Personal history of other venous thrombosis and embolism: Secondary | ICD-10-CM | POA: Diagnosis not present

## 2023-01-16 DIAGNOSIS — M1990 Unspecified osteoarthritis, unspecified site: Secondary | ICD-10-CM | POA: Diagnosis present

## 2023-01-16 DIAGNOSIS — N184 Chronic kidney disease, stage 4 (severe): Secondary | ICD-10-CM | POA: Diagnosis present

## 2023-01-16 DIAGNOSIS — I129 Hypertensive chronic kidney disease with stage 1 through stage 4 chronic kidney disease, or unspecified chronic kidney disease: Secondary | ICD-10-CM | POA: Diagnosis present

## 2023-01-16 DIAGNOSIS — E785 Hyperlipidemia, unspecified: Secondary | ICD-10-CM | POA: Diagnosis present

## 2023-01-16 DIAGNOSIS — Z8249 Family history of ischemic heart disease and other diseases of the circulatory system: Secondary | ICD-10-CM | POA: Diagnosis not present

## 2023-01-16 DIAGNOSIS — J811 Chronic pulmonary edema: Secondary | ICD-10-CM | POA: Diagnosis not present

## 2023-01-16 DIAGNOSIS — Z1152 Encounter for screening for COVID-19: Secondary | ICD-10-CM | POA: Diagnosis not present

## 2023-01-16 DIAGNOSIS — E872 Acidosis, unspecified: Secondary | ICD-10-CM | POA: Diagnosis present

## 2023-01-16 DIAGNOSIS — Z9049 Acquired absence of other specified parts of digestive tract: Secondary | ICD-10-CM | POA: Diagnosis not present

## 2023-01-16 DIAGNOSIS — R918 Other nonspecific abnormal finding of lung field: Secondary | ICD-10-CM | POA: Diagnosis present

## 2023-01-16 DIAGNOSIS — Z8711 Personal history of peptic ulcer disease: Secondary | ICD-10-CM | POA: Diagnosis not present

## 2023-01-16 DIAGNOSIS — J90 Pleural effusion, not elsewhere classified: Secondary | ICD-10-CM | POA: Diagnosis not present

## 2023-01-16 DIAGNOSIS — E876 Hypokalemia: Secondary | ICD-10-CM | POA: Diagnosis present

## 2023-01-16 DIAGNOSIS — M109 Gout, unspecified: Secondary | ICD-10-CM | POA: Diagnosis present

## 2023-01-16 DIAGNOSIS — Z8719 Personal history of other diseases of the digestive system: Secondary | ICD-10-CM | POA: Diagnosis not present

## 2023-01-16 DIAGNOSIS — I4819 Other persistent atrial fibrillation: Secondary | ICD-10-CM | POA: Diagnosis present

## 2023-01-16 DIAGNOSIS — J690 Pneumonitis due to inhalation of food and vomit: Secondary | ICD-10-CM | POA: Diagnosis present

## 2023-01-16 DIAGNOSIS — Z83438 Family history of other disorder of lipoprotein metabolism and other lipidemia: Secondary | ICD-10-CM | POA: Diagnosis not present

## 2023-01-16 DIAGNOSIS — Z7901 Long term (current) use of anticoagulants: Secondary | ICD-10-CM | POA: Diagnosis not present

## 2023-01-16 LAB — HEPATIC FUNCTION PANEL
ALT: 11 U/L (ref 0–44)
AST: 28 U/L (ref 15–41)
Albumin: 2.6 g/dL — ABNORMAL LOW (ref 3.5–5.0)
Alkaline Phosphatase: 146 U/L — ABNORMAL HIGH (ref 38–126)
Bilirubin, Direct: 0.3 mg/dL — ABNORMAL HIGH (ref 0.0–0.2)
Indirect Bilirubin: 0.9 mg/dL (ref 0.3–0.9)
Total Bilirubin: 1.2 mg/dL (ref 0.3–1.2)
Total Protein: 5.8 g/dL — ABNORMAL LOW (ref 6.5–8.1)

## 2023-01-16 LAB — LACTIC ACID, PLASMA
Lactic Acid, Venous: 2.2 mmol/L (ref 0.5–1.9)
Lactic Acid, Venous: 2.2 mmol/L (ref 0.5–1.9)
Lactic Acid, Venous: 2.5 mmol/L (ref 0.5–1.9)

## 2023-01-16 LAB — RETICULOCYTES
Immature Retic Fract: 17.5 % — ABNORMAL HIGH (ref 2.3–15.9)
RBC.: 3.18 MIL/uL — ABNORMAL LOW (ref 4.22–5.81)
Retic Count, Absolute: 52.2 10*3/uL (ref 19.0–186.0)
Retic Ct Pct: 1.6 % (ref 0.4–3.1)

## 2023-01-16 LAB — GLUCOSE, CAPILLARY
Glucose-Capillary: 125 mg/dL — ABNORMAL HIGH (ref 70–99)
Glucose-Capillary: 131 mg/dL — ABNORMAL HIGH (ref 70–99)
Glucose-Capillary: 117 mg/dL — ABNORMAL HIGH (ref 70–99)
Glucose-Capillary: 110 mg/dL — ABNORMAL HIGH (ref 70–99)
Glucose-Capillary: 99 mg/dL (ref 70–99)

## 2023-01-16 LAB — CBC
HCT: 25.9 % — ABNORMAL LOW (ref 39.0–52.0)
Hemoglobin: 8.2 g/dL — ABNORMAL LOW (ref 13.0–17.0)
MCH: 25.9 pg — ABNORMAL LOW (ref 26.0–34.0)
MCHC: 31.7 g/dL (ref 30.0–36.0)
MCV: 81.7 fL (ref 80.0–100.0)
Platelets: 272 10*3/uL (ref 150–400)
RBC: 3.17 MIL/uL — ABNORMAL LOW (ref 4.22–5.81)
RDW: 15.9 % — ABNORMAL HIGH (ref 11.5–15.5)
WBC: 11 10*3/uL — ABNORMAL HIGH (ref 4.0–10.5)
nRBC: 0 % (ref 0.0–0.2)

## 2023-01-16 LAB — SARS CORONAVIRUS 2 BY RT PCR: SARS Coronavirus 2 by RT PCR: NEGATIVE

## 2023-01-16 LAB — CULTURE, BLOOD (ROUTINE X 2): Culture: NO GROWTH

## 2023-01-16 LAB — BASIC METABOLIC PANEL
Anion gap: 13 (ref 5–15)
BUN: 22 mg/dL (ref 8–23)
CO2: 22 mmol/L (ref 22–32)
Calcium: 8.2 mg/dL — ABNORMAL LOW (ref 8.9–10.3)
Chloride: 106 mmol/L (ref 98–111)
Creatinine, Ser: 2.41 mg/dL — ABNORMAL HIGH (ref 0.61–1.24)
GFR, Estimated: 27 mL/min — ABNORMAL LOW (ref >60)
Glucose, Bld: 155 mg/dL — ABNORMAL HIGH (ref 70–99)
Potassium: 3.9 mmol/L (ref 3.5–5.1)
Sodium: 141 mmol/L (ref 135–145)

## 2023-01-16 LAB — FOLATE: Folate: 11.7 ng/mL (ref >5.9)

## 2023-01-16 LAB — RESPIRATORY PANEL BY PCR

## 2023-01-16 LAB — IRON AND TIBC
Iron: 19 ug/dL — ABNORMAL LOW (ref 45–182)
Saturation Ratios: 8 % — ABNORMAL LOW (ref 17.9–39.5)
TIBC: 238 ug/dL — ABNORMAL LOW (ref 250–450)
UIBC: 219 ug/dL

## 2023-01-16 LAB — VITAMIN B12: Vitamin B-12: 329 pg/mL (ref 180–914)

## 2023-01-16 LAB — TYPE AND SCREEN
ABO/RH(D): O NEG
Antibody Screen: NEGATIVE

## 2023-01-16 LAB — HEMOGLOBIN A1C
Hgb A1c MFr Bld: 6.1 % — ABNORMAL HIGH (ref 4.8–5.6)
Mean Plasma Glucose: 128.37 mg/dL

## 2023-01-16 LAB — FERRITIN: Ferritin: 134 ng/mL (ref 24–336)

## 2023-01-16 LAB — POTASSIUM: Potassium: 2.8 mmol/L — ABNORMAL LOW (ref 3.5–5.1)

## 2023-01-16 LAB — CBG MONITORING, ED: Glucose-Capillary: 163 mg/dL — ABNORMAL HIGH (ref 70–99)

## 2023-01-16 MED ORDER — SODIUM CHLORIDE 0.9% FLUSH
3.0000 mL | Freq: Two times a day (BID) | INTRAVENOUS | Status: DC
  Administered 2023-01-16 – 2023-01-19 (×8): 3 mL via INTRAVENOUS

## 2023-01-16 MED ORDER — SERTRALINE HCL 50 MG PO TABS
50.0000 mg | ORAL_TABLET | Freq: Every day | ORAL | Status: DC
  Administered 2023-01-16 – 2023-01-19 (×4): 50 mg via ORAL
  Filled 2023-01-16 (×4): qty 1

## 2023-01-16 MED ORDER — POTASSIUM CHLORIDE 20 MEQ PO PACK
60.0000 meq | PACK | ORAL | Status: AC
  Administered 2023-01-16: 60 meq via ORAL
  Filled 2023-01-16: qty 3

## 2023-01-16 MED ORDER — INSULIN ASPART 100 UNIT/ML IJ SOLN: 0.0000 [IU] | Freq: Every day | INTRAMUSCULAR | Status: DC

## 2023-01-16 MED ORDER — DOXAZOSIN MESYLATE 4 MG PO TABS
4.0000 mg | ORAL_TABLET | Freq: Two times a day (BID) | ORAL | Status: DC
  Administered 2023-01-16 – 2023-01-19 (×8): 4 mg via ORAL
  Filled 2023-01-16 (×8): qty 1

## 2023-01-16 MED ORDER — VITAMIN D 25 MCG (1000 UNIT) PO TABS
2000.0000 [IU] | ORAL_TABLET | Freq: Every day | ORAL | Status: DC
  Administered 2023-01-16 – 2023-01-19 (×4): 2000 [IU] via ORAL
  Filled 2023-01-16 (×4): qty 2

## 2023-01-16 MED ORDER — FUROSEMIDE 40 MG PO TABS
80.0000 mg | ORAL_TABLET | Freq: Two times a day (BID) | ORAL | Status: DC
  Administered 2023-01-16 – 2023-01-19 (×7): 80 mg via ORAL
  Filled 2023-01-16 (×7): qty 2

## 2023-01-16 MED ORDER — SODIUM CHLORIDE 0.9 % IV SOLN
3.0000 g | Freq: Two times a day (BID) | INTRAVENOUS | Status: DC
  Administered 2023-01-16 – 2023-01-19 (×8): 3 g via INTRAVENOUS
  Filled 2023-01-16 (×8): qty 8

## 2023-01-16 MED ORDER — ACETAMINOPHEN 650 MG RE SUPP: 650.0000 mg | Freq: Four times a day (QID) | RECTAL | Status: DC | PRN

## 2023-01-16 MED ORDER — ATORVASTATIN CALCIUM 40 MG PO TABS
40.0000 mg | ORAL_TABLET | Freq: Every evening | ORAL | Status: DC
  Administered 2023-01-16 – 2023-01-18 (×3): 40 mg via ORAL
  Filled 2023-01-16 (×3): qty 1

## 2023-01-16 MED ORDER — AMLODIPINE BESYLATE 10 MG PO TABS
10.0000 mg | ORAL_TABLET | Freq: Every evening | ORAL | Status: DC
  Administered 2023-01-16 – 2023-01-18 (×3): 10 mg via ORAL
  Filled 2023-01-16 (×3): qty 1

## 2023-01-16 MED ORDER — POLYETHYLENE GLYCOL 3350 17 G PO PACK: 17.0000 g | PACK | Freq: Every day | ORAL | Status: DC | PRN

## 2023-01-16 MED ORDER — DONEPEZIL HCL 5 MG PO TABS
5.0000 mg | ORAL_TABLET | Freq: Every day | ORAL | Status: DC
  Administered 2023-01-16 – 2023-01-18 (×4): 5 mg via ORAL
  Filled 2023-01-16 (×4): qty 1

## 2023-01-16 MED ORDER — ACETAMINOPHEN 325 MG PO TABS: 650.0000 mg | ORAL_TABLET | Freq: Four times a day (QID) | ORAL | Status: DC | PRN

## 2023-01-16 MED ORDER — ALLOPURINOL 100 MG PO TABS
100.0000 mg | ORAL_TABLET | Freq: Two times a day (BID) | ORAL | Status: DC
  Administered 2023-01-16 – 2023-01-17 (×5): 100 mg via ORAL
  Filled 2023-01-16 (×5): qty 1

## 2023-01-16 MED ORDER — APIXABAN 5 MG PO TABS
5.0000 mg | ORAL_TABLET | Freq: Two times a day (BID) | ORAL | Status: DC
  Administered 2023-01-16 – 2023-01-19 (×7): 5 mg via ORAL
  Filled 2023-01-16 (×7): qty 1

## 2023-01-16 MED ORDER — SODIUM CHLORIDE 0.9 % IV SOLN
500.0000 mg | INTRAVENOUS | Status: AC
  Administered 2023-01-16 – 2023-01-18 (×2): 500 mg via INTRAVENOUS
  Filled 2023-01-16 (×2): qty 5

## 2023-01-16 MED ORDER — METOPROLOL TARTRATE 25 MG PO TABS
25.0000 mg | ORAL_TABLET | Freq: Two times a day (BID) | ORAL | Status: DC
  Administered 2023-01-16 – 2023-01-19 (×8): 25 mg via ORAL
  Filled 2023-01-16 (×8): qty 1

## 2023-01-16 MED ORDER — CLONIDINE HCL 0.2 MG PO TABS
0.3000 mg | ORAL_TABLET | Freq: Three times a day (TID) | ORAL | Status: DC
  Administered 2023-01-16 – 2023-01-19 (×10): 0.3 mg via ORAL
  Filled 2023-01-16 (×10): qty 1

## 2023-01-16 MED ORDER — INSULIN ASPART 100 UNIT/ML IJ SOLN
0.0000 [IU] | Freq: Three times a day (TID) | INTRAMUSCULAR | Status: DC
  Administered 2023-01-16 – 2023-01-18 (×3): 1 [IU] via SUBCUTANEOUS

## 2023-01-16 MED ORDER — SODIUM CHLORIDE 0.9 % IV SOLN
500.0000 mg | Freq: Once | INTRAVENOUS | Status: AC
  Administered 2023-01-16: 500 mg via INTRAVENOUS
  Filled 2023-01-16: qty 5

## 2023-01-16 MED ORDER — ASPIRIN 81 MG PO TBEC
81.0000 mg | DELAYED_RELEASE_TABLET | Freq: Every day | ORAL | Status: DC
  Administered 2023-01-16 – 2023-01-19 (×4): 81 mg via ORAL
  Filled 2023-01-16 (×4): qty 1

## 2023-01-16 MED ORDER — AMLODIPINE-ATORVASTATIN 10-40 MG PO TABS: 1.0000 | ORAL_TABLET | Freq: Every evening | ORAL | Status: DC

## 2023-01-16 NOTE — ED Notes (Signed)
ED TO INPATIENT HANDOFF REPORT  ED Nurse Name and Phone #: Delfin Edis - [phone]  S Name/Age/Gender James [name] 77 y.o. male Room/Bed: 024C/024C  Code Status   Code Status: Full Code  Home/SNF/Other Home Patient oriented to: self and place Is this baseline? Yes   Triage Complete: Triage complete  Chief Complaint Community acquired pneumonia [J18.9]  Triage Note Pt via POV c/o emesis since yesterday with chest pain since around 3pm this afternoon. Estimates 5 episodes of emesis today. Pt rates mid chest pain 9/10 radiating to back. PMH includes stage III renal failure, a fib, pericardial effusion. Pt is tachypeic and ill-appearing in triage. Pt takes thinners; no known hx of AAA  Pt via EMS transfer from [facility] to get CT scan done and probably admission. Report is septic with lactic acid at 7 and K+ 2.4. Hx of dementia. Oriented to self and place. Has had multiple episodes of vomiting since yesterday with abd pain.    Allergies Allergies  Allergen Reactions   Plavix [Clopidogrel Bisulfate] Other (See Comments)    GI Bleed   Uloric [Febuxostat] Other (See Comments)    Unknown reaction    Level of Care/Admitting Diagnosis ED Disposition     ED Disposition  Admit   Condition  --   Comment  Hospital Area: [facility] [[id number]]  Level of Care: Telemetry Medical [104]  May admit patient to [facility] or [facility] if equivalent level of care is available:: No  Covid Evaluation: Symptomatic Person Under Investigation (PUI) or recent exposure (last 10 days) *Testing Required*  Diagnosis: Community acquired pneumonia [347425]  Admitting Physician: Nolberto Hanlon [[id number]]  Attending Physician: Nolberto Hanlon [[id number]]  Certification:: I certify this patient will need inpatient services for at least 2 midnights  Estimated Length of Stay: 3          B Medical/Surgery History Past Medical History:  Diagnosis Date   Arthritis     Carotid artery occlusion    Chronic kidney disease    DVT (deep venous thrombosis) (HCC) 2008   GERD (gastroesophageal reflux disease)    takes Protonix daily   Gout    takes Allopurinol daily and Indomethacin prn   H/O renal artery stenosis    H/O: GI bleed    History of blood transfusion    History of colon polyps    History of gastric ulcer    Hyperlipidemia    takes Lipitor daily   Hypertension    takes Metoprolol and Amlodipine daily   Joint pain    Joint swelling    Sleep apnea    Past Surgical History:  Procedure Laterality Date   CARDIOVERSION N/A 11/18/2022   Procedure: CARDIOVERSION;  Surgeon: Chilton Si, MD;  Location: [facility] INVASIVE CV LAB;  Service: Cardiovascular;  Laterality: N/A;   CAROTID ENDARTERECTOMY  2008   right CEA   CHOLECYSTECTOMY  mid 90's   COLON SURGERY     COLONOSCOPY     ENDARTERECTOMY  06/02/2012   Procedure: ENDARTERECTOMY CAROTID;  Surgeon: Nada Libman, MD;  Location: [facility] OR;  Service: Vascular;  Laterality: Left;   ESOPHAGOGASTRODUODENOSCOPY     HERNIA REPAIR     umbilical hernia   PATCH ANGIOPLASTY  06/02/2012   Procedure: PATCH ANGIOPLASTY;  Surgeon: Nada Libman, MD;  Location: [facility] OR;  Service: Vascular;  Laterality: Left;  using Vascu-Guard Patch   PERIPHERAL VASCULAR CATHETERIZATION Right 12/03/2015   Procedure: Peripheral Vascular Balloon Angioplasty;  Surgeon: Faylene Million  Janae Bridgeman, MD;  Location: [facility] INVASIVE CV LAB;  Service: Cardiovascular;  Laterality: Right;  RENAL unsuccessful   RENAL ARTERY STENT  2009   Right renal artery stenting-  Right Kidney   small intestine removed  2001   d/t random gi bleeding   SUBXYPHOID PERICARDIAL WINDOW N/A 09/23/2022   Procedure: SUBXYPHOID PERICARDIAL WINDOW;  Surgeon: Loreli Slot, MD;  Location: [facility] OR;  Service: Thoracic;  Laterality: N/A;   TEE WITHOUT CARDIOVERSION N/A 09/23/2022   Procedure: TRANSESOPHAGEAL ECHOCARDIOGRAM;  Surgeon: Loreli Slot, MD;  Location: [facility] OR;   Service: Thoracic;  Laterality: N/A;     A IV Location/Drains/Wounds Patient Lines/Drains/Airways Status     Active Line/Drains/Airways     Name Placement date Placement time Site Days   Peripheral IV 01/15/23 20 G Anterior;Proximal;Right;Upper Arm 01/15/23  1826  Arm  1   Peripheral IV 01/15/23 20 G Anterior;Right Forearm 01/15/23  1953  Forearm  1            Intake/Output Last 24 hours No intake or output data in the 24 hours ending 01/16/23 0224  Labs/Imaging Results for orders placed or performed during the hospital encounter of 01/15/23 (from the past 48 hour(s))  Culture, blood (Routine x 2)     Status: None (Preliminary result)   Collection Time: 01/15/23  6:01 PM   Specimen: BLOOD  Result Value Ref Range   Specimen Description BLOOD RIGHT ANTECUBITAL    Special Requests      BOTTLES DRAWN AEROBIC AND ANAEROBIC Blood Culture results may not be optimal due to an inadequate volume of blood received in culture bottles Performed at [facility], [address] [place], [place] [address]    Culture PENDING    Report Status PENDING   Culture, blood (Routine x 2)     Status: None (Preliminary result)   Collection Time: 01/15/23  6:05 PM   Specimen: BLOOD  Result Value Ref Range   Specimen Description BLOOD BLOOD RIGHT FOREARM    Special Requests      BOTTLES DRAWN AEROBIC AND ANAEROBIC Blood Culture adequate volume Performed at [facility], [address], [place], [place] [address]    Culture PENDING    Report Status PENDING   Comprehensive metabolic panel     Status: Abnormal   Collection Time: 01/15/23  6:06 PM  Result Value Ref Range   Sodium 137 135 - 145 mmol/L   Potassium 2.4 (LL) 3.5 - 5.1 mmol/L    Comment: CRITICAL RESULT CALLED TO, READ BACK BY AND VERIFIED WITH GREENWOOD,B ON 01/15/23 AT 1905 BY LOY,C   Chloride 96 (L) 98 - 111 mmol/L   CO2 20 (L) 22 - 32 mmol/L   Glucose, Bld 190 (H) 70 - 99 mg/dL    Comment: Glucose reference range applies only to  samples taken after fasting for at least 8 hours.   BUN 27 (H) 8 - 23 mg/dL   Creatinine, Ser 8.46 (H) 0.61 - 1.24 mg/dL   Calcium 9.0 8.9 - 96.2 mg/dL   Total Protein 7.3 6.5 - 8.1 g/dL   Albumin 3.4 (L) 3.5 - 5.0 g/dL   AST 35 15 - 41 U/L   ALT 14 0 - 44 U/L   Alkaline Phosphatase 182 (H) 38 - 126 U/L   Total Bilirubin 1.7 (H) 0.3 - 1.2 mg/dL   GFR, Estimated 25 (L) >60 mL/min    Comment: (NOTE) Calculated using the CKD-EPI Creatinine Equation (2021)    Anion gap  21 (H) 5 - 15    Comment: Electrolytes repeated to confirm. Electrolytes repeated to confirm. Performed at [facility], [address], [place], [place] [address]   Lactic acid, plasma     Status: Abnormal   Collection Time: 01/15/23  6:06 PM  Result Value Ref Range   Lactic Acid, Venous 4.6 (HH) 0.5 - 1.9 mmol/L    Comment: CRITICAL RESULT CALLED TO, READ BACK BY AND VERIFIED WITH PETERMAN,A ON 01/15/23 AT 1830 BY LOY,C Performed at [facility], [address], [place], [place] [address]   CBC with Differential     Status: Abnormal   Collection Time: 01/15/23  6:06 PM  Result Value Ref Range   WBC 9.2 4.0 - 10.5 K/uL   RBC 3.52 (L) 4.22 - 5.81 MIL/uL   Hemoglobin 9.4 (L) 13.0 - 17.0 g/dL   HCT 52.8 (L) 41.3 - 24.4 %   MCV 79.3 (L) 80.0 - 100.0 fL   MCH 26.7 26.0 - 34.0 pg   MCHC 33.7 30.0 - 36.0 g/dL   RDW 01.0 27.2 - 53.6 %   Platelets 353 150 - 400 K/uL   nRBC 0.0 0.0 - 0.2 %   Neutrophils Relative % 89 %   Neutro Abs 8.2 (H) 1.7 - 7.7 K/uL   Lymphocytes Relative 5 %   Lymphs Abs 0.4 (L) 0.7 - 4.0 K/uL   Monocytes Relative 5 %   Monocytes Absolute 0.5 0.1 - 1.0 K/uL   Eosinophils Relative 0 %   Eosinophils Absolute 0.0 0.0 - 0.5 K/uL   Basophils Relative 1 %   Basophils Absolute 0.1 0.0 - 0.1 K/uL   Immature Granulocytes 0 %   Abs Immature Granulocytes 0.04 0.00 - 0.07 K/uL    Comment: Performed at [facility], [address], [place], [place] [address]  Protime-INR     Status: Abnormal   Collection  Time: 01/15/23  6:06 PM  Result Value Ref Range   Prothrombin Time 21.1 (H) 11.4 - 15.2 seconds   INR 1.8 (H) 0.8 - 1.2    Comment: (NOTE) INR goal varies based on device and disease states. Performed at [facility], [address] [place], [place] [address]   Troponin I (High Sensitivity)     Status: Abnormal   Collection Time: 01/15/23  6:06 PM  Result Value Ref Range   Troponin I (High Sensitivity) 36 (H) <18 ng/L    Comment: (NOTE) Elevated high sensitivity troponin I (hsTnI) values and significant  changes across serial measurements may suggest ACS but many other  chronic and acute conditions are known to elevate hsTnI results.  Refer to the "Links" section for chest pain algorithms and additional  guidance. Performed at [facility], [address], [place], [place] [address]   Magnesium     Status: None   Collection Time: 01/15/23  6:06 PM  Result Value Ref Range   Magnesium 1.8 1.7 - 2.4 mg/dL    Comment: Performed at [facility], [address], [place], [place] [address]  Lactic acid, plasma     Status: Abnormal   Collection Time: 01/15/23  7:48 PM  Result Value Ref Range   Lactic Acid, Venous 5.5 (HH) 0.5 - 1.9 mmol/L    Comment: CRITICAL VALUE NOTED.  VALUE IS CONSISTENT WITH PREVIOUSLY REPORTED AND CALLED VALUE. Performed at [facility], [address] [place], [place] [address]   Troponin I (High Sensitivity)     Status: Abnormal   Collection Time: 01/15/23  7:48 PM  Result Value Ref Range   Troponin I (High Sensitivity) 34 (H) <18 ng/L    Comment: (NOTE) Elevated high sensitivity troponin I (hsTnI) values and significant  changes across serial measurements may suggest ACS but many other  chronic and acute conditions are known to elevate hsTnI results.  Refer to the "Links" section for chest pain algorithms and additional  guidance. Performed at [facility], [address] [place], [place] [address]   Lipase, blood     Status: None   Collection Time:  01/15/23  7:48 PM  Result Value Ref Range   Lipase 28 11 - 51 U/L    Comment: Performed at [facility], [address] [place], [place] [address]  Urinalysis, w/ Reflex to Culture (Infection Suspected) -Urine, Clean Catch     Status: Abnormal   Collection Time: 01/15/23  9:29 PM  Result Value Ref Range   Specimen Source URINE, CATHETERIZED    Color, Urine YELLOW YELLOW   APPearance CLEAR CLEAR   Specific Gravity, Urine 1.006 1.005 - 1.030   pH 7.0 5.0 - 8.0   Glucose, UA NEGATIVE NEGATIVE mg/dL   Hgb urine dipstick MODERATE (A) NEGATIVE   Bilirubin Urine NEGATIVE NEGATIVE   Ketones, ur 5 (A) NEGATIVE mg/dL   Protein, ur 30 (A) NEGATIVE mg/dL   Nitrite NEGATIVE NEGATIVE   Leukocytes,Ua NEGATIVE NEGATIVE   RBC / HPF 0-5 0 - 5 RBC/hpf   WBC, UA 6-10 0 - 5 WBC/hpf    Comment:        Reflex urine culture not performed if WBC <=10, OR if Squamous epithelial cells >5. If Squamous epithelial cells >5 suggest recollection.    Bacteria, UA NONE SEEN NONE SEEN   Squamous Epithelial / HPF 0-5 0 - 5 /HPF   Hyaline Casts, UA PRESENT     Comment: Performed at [facility], [address], [place], [place] [address]  Lactic acid, plasma     Status: Abnormal   Collection Time: 01/15/23 10:40 PM  Result Value Ref Range   Lactic Acid, Venous 3.6 (HH) 0.5 - 1.9 mmol/L    Comment: CRITICAL RESULT CALLED TO, READ BACK BY AND VERIFIED WITH Hilbert Corrigan RN @ [phone] 01/15/23 JBUTLER Performed at [facility], [address] [place], [place] [address]   Lactic acid, plasma     Status: Abnormal   Collection Time: 01/16/23 12:04 AM  Result Value Ref Range   Lactic Acid, Venous 2.2 (HH) 0.5 - 1.9 mmol/L    Comment: CRITICAL VALUE NOTED. VALUE IS CONSISTENT WITH PREVIOUSLY REPORTED/CALLED VALUE Performed at [facility], [address] [place], [place] [address]   Type and screen [facility]     Status: None (Preliminary result)   Collection Time: 01/16/23 12:12 AM   Result Value Ref Range   ABO/RH(D) PENDING    Antibody Screen PENDING    Sample Expiration      01/19/2023,2359 Performed at [facility], [address], [place], [place] [address]   Potassium     Status: Abnormal   Collection Time: 01/16/23  1:15 AM  Result Value Ref Range   Potassium 2.8 (L) 3.5 - 5.1 mmol/L    Comment: Performed at [facility], [address] [place], [place] [address]  Hemoglobin A1c     Status: Abnormal   Collection Time: 01/16/23  1:15 AM  Result Value Ref Range   Hgb A1c MFr Bld 6.1 (H) 4.8 -  5.6 %    Comment: (NOTE) Pre diabetes:          5.7%-6.4%  Diabetes:              >6.4%  Glycemic control for   <7.0% adults with diabetes    Mean Plasma Glucose 128.37 mg/dL    Comment: Performed at [facility], [address], [place], [place] [address]  Reticulocytes     Status: Abnormal   Collection Time: 01/16/23  1:15 AM  Result Value Ref Range   Retic Ct Pct 1.6 0.4 - 3.1 %   RBC. 3.18 (L) 4.22 - 5.81 MIL/uL   Retic Count, Absolute 52.2 19.0 - 186.0 K/uL   Immature Retic Fract 17.5 (H) 2.3 - 15.9 %    Comment: Performed at [facility], [address], [place], [place] [address]  SARS Coronavirus 2 by RT PCR (hospital order, performed in [facility] hospital lab) *cepheid single result test*     Status: None   Collection Time: 01/16/23  1:22 AM  Result Value Ref Range   SARS Coronavirus 2 by RT PCR NEGATIVE NEGATIVE    Comment: Performed at [facility], [address] [place], [place] [address]  CBG monitoring, ED     Status: Abnormal   Collection Time: 01/16/23  1:23 AM  Result Value Ref Range   Glucose-Capillary 163 (H) 70 - 99 mg/dL    Comment: Glucose reference range applies only to samples taken after fasting for at least 8 hours.   CT CHEST ABDOMEN PELVIS WO CONTRAST  Result Date: 01/15/2023 CLINICAL DATA:  sepsis, lactic acidosis. abdominal pain EXAM: CT CHEST, ABDOMEN AND PELVIS WITHOUT CONTRAST TECHNIQUE:  Multidetector CT imaging of the chest, abdomen and pelvis was performed following the standard protocol without IV contrast. RADIATION DOSE REDUCTION: This exam was performed according to the departmental dose-optimization program which includes automated exposure control, adjustment of the mA and/or kV according to patient size and/or use of iterative reconstruction technique. COMPARISON:  CT chest abdomen pelvis 08/30/2022 FINDINGS: CT CHEST FINDINGS Cardiovascular: Normal heart size. Interval decrease in size of a trace pericardial effusion. The thoracic aorta is normal in caliber. Severe atherosclerotic plaque of the thoracic aorta. Four-vessel coronary artery calcifications. The main pulmonary artery is normal in caliber. Mediastinum/Nodes: No enlarged mediastinal, hilar, or axillary lymph nodes. Thyroid gland, trachea, and esophagus demonstrate no significant findings. Lungs/Pleura: Hyperinflation of the lungs with biapical trace paraseptal emphysematous changes. Biapical pleural/pulmonary scarring. Diffuse bronchial wall thickening. Peripheral left upper lobe 9 x 8 mm ground-glass airspace opacity (4:22). Few vague scattered peribronchovascular ground-glass airspace opacities within the right upper lobe. No pulmonary mass. Bilateral trace pleural effusions. No pneumothorax. Musculoskeletal: Bilateral gynecomastia. No suspicious lytic or blastic osseous lesions. No acute displaced fracture. Multilevel degenerative changes of the spine. CT ABDOMEN PELVIS FINDINGS Hepatobiliary: No focal liver abnormality. Status post cholecystectomy. No biliary dilatation. Pancreas: No focal lesion. Normal pancreatic contour. No surrounding inflammatory changes. No main pancreatic ductal dilatation. Spleen: Normal in size without focal abnormality. Adrenals/Urinary Tract: No adrenal nodule bilaterally. No nephrolithiasis and no hydronephrosis. 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 Subcentimeter hypodensities are too small to characterize-no further follow-up indicated. No ureterolithiasis or hydroureter. The urinary bladder is unremarkable. Stomach/Bowel: Stomach is within normal limits. No evidence of bowel wall thickening or dilatation. Colonic diverticulosis. Appendix appears normal. Vascular/Lymphatic: No abdominal aorta or iliac aneurysm. Severe atherosclerotic plaque  of the aorta and its branches. No abdominal, pelvic, or inguinal lymphadenopathy. Reproductive: Prostate is unremarkable. Other: No intraperitoneal free fluid. No intraperitoneal free gas. No organized fluid collection. Musculoskeletal: No abdominal wall hernia or abnormality. No suspicious lytic or blastic osseous lesions. No acute displaced fracture. Multilevel degenerative changes of the spine. IMPRESSION: 1. Diffuse bronchial wall thickening with associated persistent vague scattered peribronchovascular ground-glass airspace opacities within the right upper lobe that may represent infection/inflammation. Interval increase in size of a peripheral left upper lobe 9 mm (from 7 mm) ground-glass pulmonary nodule. Adenocarcinoma is not excluded. Additional imaging evaluation or consultation with Pulmonology or Thoracic Surgery recommended. 2. Bilateral trace pleural effusions. 3. Colonic diverticulosis with no acute diverticulitis. 4. Aortic Atherosclerosis (ICD10-I70.0) including four-vessel coronary calcification. Emphysema (ICD10-J43.9). Electronically Signed   By: Tish Frederickson M.D.   On: 01/15/2023 22:52   DG Abd Portable 1V  Result Date: 01/15/2023 CLINICAL DATA:  One day history of EXAM: PORTABLE ABDOMEN - 1 VIEW COMPARISON:  CT abdomen and pelvis dated 08/30/2022 FINDINGS: Relative paucity of bowel gas. No free air or pneumatosis. No abnormal radio-opaque calculi or mass effect. No acute or substantial osseous abnormality. The sacrum and coccyx are partially  obscured by overlying bowel contents. Right upper quadrant cholecystectomy clips. IMPRESSION: Relative paucity of bowel gas. No free air. Electronically Signed   By: Agustin Cree M.D.   On: 01/15/2023 18:24   DG Chest Port 1 View  Result Date: 01/15/2023 CLINICAL DATA:  Pain EXAM: PORTABLE CHEST 1 VIEW COMPARISON:  Chest radiograph dated 10/13/2022 FINDINGS: Normal lung volumes. No focal consolidations. No pleural effusion or pneumothorax. Similar mildly enlarged cardiomediastinal silhouette. No acute osseous abnormality. IMPRESSION: 1.  No focal consolidations. 2. Similar mildly enlarged cardiomediastinal silhouette. Electronically Signed   By: Agustin Cree M.D.   On: 01/15/2023 18:23    Pending Labs Unresulted Labs (From admission, onward)     Start     Ordered   01/16/23 0500  Hepatic function panel  Tomorrow morning,   R        01/16/23 0017   01/16/23 0500  Basic metabolic panel  Tomorrow morning,   R        01/16/23 0044   01/16/23 0500  CBC  Tomorrow morning,   R        01/16/23 0044   01/16/23 0128  Lactic acid, plasma  Now then every 2 hours,   R      01/16/23 0127   01/16/23 0021  Respiratory (~20 pathogens) panel by PCR  (Respiratory panel by PCR (~20 pathogens, ~24 hr TAT)  w precautions)  Once,   URGENT        01/16/23 0021   01/16/23 0016  Vitamin B12  (Anemia Panel (PNL))  Once,   URGENT        01/16/23 0015   01/16/23 0016  Folate  (Anemia Panel (PNL))  Once,   URGENT        01/16/23 0015   01/16/23 0016  Iron and TIBC  (Anemia Panel (PNL))  Once,   URGENT        01/16/23 0015   01/16/23 0016  Ferritin  (Anemia Panel (PNL))  Once,   URGENT        01/16/23 0015   01/16/23 0016  Protein electrophoresis, serum  Once,   URGENT        01/16/23 0015   01/16/23 0006  Gastrointestinal Panel by PCR , Stool  (Gastrointestinal Panel by PCR,  Stool                                                                                                                                                      **Does Not include CLOSTRIDIUM DIFFICILE testing. **If CDIFF testing is needed, place order from the "C Difficile Testing" order set.**)  Once,   URGENT        01/16/23 0005   01/16/23 0006  C Difficile Quick Screen w PCR reflex  (C Difficile quick screen w PCR reflex panel )  Once, for 24 hours,   URGENT       References:    CDiff Information Tool   01/16/23 0005            Vitals/Pain Today's Vitals   01/15/23 2200 01/15/23 2300 01/15/23 2305 01/16/23 0123  BP: (!) 169/83  (!) 158/86 (!) 174/90  Pulse: (!) 122 (!) 113 (!) 112 (!) 111  Resp:  14 16 16   Temp:    97.8 F (36.6 C)  TempSrc:    Oral  SpO2: 99% 95% 94% 96%  Weight:      Height:      PainSc:        Isolation Precautions Airborne and Contact precautions  Medications Medications  potassium chloride 10 mEq in 100 mL IVPB (10 mEq Intravenous Not Given 01/16/23 0039)  azithromycin (ZITHROMAX) 500 mg in sodium chloride 0.9 % 250 mL IVPB (has no administration in time range)  azithromycin (ZITHROMAX) 500 mg in sodium chloride 0.9 % 250 mL IVPB (500 mg Intravenous New Bag/Given 01/16/23 0148)  insulin aspart (novoLOG) injection 0-5 Units ( Subcutaneous Not Given 01/16/23 0125)  insulin aspart (novoLOG) injection 0-9 Units (has no administration in time range)  Ampicillin-Sulbactam (UNASYN) 3 g in sodium chloride 0.9 % 100 mL IVPB (0 g Intravenous Stopped 01/16/23 0148)  aspirin EC tablet 81 mg (has no administration in time range)  sertraline (ZOLOFT) tablet 50 mg (has no administration in time range)  cloNIDine (CATAPRES) tablet 0.3 mg (has no administration in time range)  doxazosin (CARDURA) tablet 4 mg (4 mg Oral Given 01/16/23 0139)  metoprolol tartrate (LOPRESSOR) tablet 25 mg (25 mg Oral Given 01/16/23 0140)  furosemide (LASIX) tablet 80 mg (has no administration in time range)  allopurinol (ZYLOPRIM) tablet 100 mg (100 mg Oral Given 01/16/23 0139)  donepezil (ARICEPT) tablet 5 mg (5 mg Oral Given 01/16/23 0139)   cholecalciferol (VITAMIN D3) 25 MCG (1000 UNIT) tablet 2,000 Units (has no administration in time range)  acetaminophen (TYLENOL) tablet 650 mg (has no administration in time range)    Or  acetaminophen (TYLENOL) suppository 650 mg (has no administration in time range)  polyethylene glycol (MIRALAX / GLYCOLAX) packet 17 g (has no administration in time range)  sodium chloride flush (NS) 0.9 % injection 3 mL (3  mLs Intravenous Given 01/16/23 0056)  amLODipine (NORVASC) tablet 10 mg (has no administration in time range)  atorvastatin (LIPITOR) tablet 40 mg (has no administration in time range)  sodium chloride 0.9 % bolus 1,000 mL (0 mLs Intravenous Stopped 01/15/23 1954)  morphine (PF) 4 MG/ML injection 4 mg (4 mg Intravenous Given 01/15/23 1828)  ondansetron (ZOFRAN) injection 4 mg (4 mg Intravenous Given 01/15/23 1828)  ceFEPIme (MAXIPIME) 2 g in sodium chloride 0.9 % 100 mL IVPB (0 g Intravenous Stopped 01/15/23 1902)  metroNIDAZOLE (FLAGYL) IVPB 500 mg (0 mg Intravenous Stopped 01/15/23 2010)  vancomycin (VANCOREADY) IVPB 1750 mg/350 mL (0 mg Intravenous Stopped 01/15/23 2122)  sodium chloride 0.9 % bolus 1,000 mL (0 mLs Intravenous Stopped 01/15/23 2157)  potassium chloride 10 mEq in 100 mL IVPB (0 mEq Intravenous Stopped 01/15/23 2250)  lactated ringers bolus 1,000 mL (1,000 mLs Intravenous New Bag/Given 01/15/23 2258)  potassium chloride (KLOR-CON) packet 60 mEq (60 mEq Oral Given 01/16/23 0056)    Mobility non-ambulatory     Focused Assessments Cardiac Assessment Handoff:  Cardiac Rhythm: Sinus tachycardia No results found for: "CKTOTAL", "CKMB", "CKMBINDEX", "TROPONINI" Lab Results  Component Value Date   DDIMER 1.75 (H) 08/30/2022   Does the Patient currently have chest pain? No    R Recommendations: See Admitting Provider Note  Report given to:   Additional Notes: Pt transfer from [facility] with sepsis and hypokalemia. Was transferred for Ct scan. Hx of a-fib, currently  a-fib RVR - tachy 110-120s.

## 2023-01-16 NOTE — Progress Notes (Signed)
Pharmacy Antibiotic Note  James [name] is a 77 y.o. male admitted on 01/15/2023 with sepsis.  Pharmacy has been initially consulted for vancomycin and cefepime dosing, now being changed to Unasyn monotherapy for aspiration pneumonia.  Patient afebrile, wbc normal, lactic acid 4.6. Scr elevated at 2.59 which appears to be close to his normal baseline. Will use traditional dosing for now.   Plan: Unasyn 3G IV q12 hours  Height: 5\' 10"  (177.8 cm) Weight: 77.1 kg (170 lb) IBW/kg (Calculated) : 73  Temp (24hrs), Avg:97.8 F (36.6 C), Min:97.4 F (36.3 C), Max:98.6 F (37 C)  Recent Labs  Lab 01/15/23 1806 01/15/23 1948 01/15/23 2240  WBC 9.2  --   --   CREATININE 2.59*  --   --   LATICACIDVEN 4.6* 5.5* 3.6*     Estimated Creatinine Clearance: 24.7 mL/min (A) (by C-G formula based on SCr of 2.59 mg/dL (H)).    Allergies  Allergen Reactions   Plavix [Clopidogrel Bisulfate] Other (See Comments)    GI Bleed   Uloric [Febuxostat] Other (See Comments)    Unknown reaction    Thank you for allowing pharmacy to be a part of this patient's care.  Ruben Im, PharmD Clinical Pharmacist 01/16/2023 12:23 AM Please check AMION for all [facility] numbers

## 2023-01-16 NOTE — Assessment & Plan Note (Signed)
Check stool for C. difficile and pathogen.  Treat with Unasyn.  S/p vancomycin and cefepime and metronidazole

## 2023-01-16 NOTE — Assessment & Plan Note (Signed)
With RVR, now better rate controlled.  I will hold off on apixaban till patient's left arm ultrasound is completed.  Continue on telemetry.

## 2023-01-16 NOTE — ED Notes (Signed)
Tried calling report X 1 at this time to the floor.

## 2023-01-16 NOTE — Assessment & Plan Note (Signed)
Check COVID-19, check, influenza.  Blood cultures pending.  Treat with Unasyn and azithromycin.  Concern for aspiration, given that patient was vomiting.

## 2023-01-16 NOTE — Assessment & Plan Note (Signed)
Interval increase in size of a peripheral left upper lobe 9 mm (from 7 mm) ground-glass pulmonary nodule. Adenocarcinoma is not excluded. Additional imaging evaluation or consultation with Pulmonology or Thoracic Surgery recommended.

## 2023-01-16 NOTE — Assessment & Plan Note (Signed)
This was related to patient having vomiting, it has since then resolved.  Troponins are flat, CT scan shows no pneumothorax.  We will continue to monitor clinically, patient is not having vomiting at this time

## 2023-01-16 NOTE — Assessment & Plan Note (Signed)
Patient likely had sepsis. Althogut no fever, no white count. S/p 3 liter fluids. At this time hold furhter fluid, check lactate again, if down trending, do no plan on more fluids tonight as atpient tends to get fluids overloaded.

## 2023-01-16 NOTE — Assessment & Plan Note (Signed)
Fattening appears to be at baseline.  Patient describes having chronic fluid overload.  Given left arm edema and bruising, I will check an ultrasound over there.  And make sure no active bleeding.

## 2023-01-16 NOTE — H&P (Addendum)
History and Physical    Patient: James [name] KGM:[id number] DOB: [birth year] DOA: 01/15/2023 DOS: the patient was seen and examined on 01/15/2023 PCP: [name], [name], MD  Patient coming from: Home  Chief Complaint:  Chief Complaint  Patient presents with   Emesis   Blood Infection   HPI: James [name] is a 77 y.o. male with medical history significant of dementia.  Therefore patient is unable to provide detailed account of his recent illness.  Fortunately we have the patient's son at the bedside as well as his wife who are giving history.  Patient seems to have been in his usual state of health till yesterday morning when patient threw up after breakfast at approximately 11 AM.  Thereafter patient had poor appetite all day yesterday and seemed a bit lethargic.  Since this morning patient has had at least 4 episodes of vomiting which are small nonbloody consisting of ingested food versus bile.  Associated with multiple loose bowel movements.  The color of the bowel movements is not known, the exact number of bowel movements is not known but is estimated at approximately 4 today.  Patient did not complain of any abdominal pain.  At approximately 3 PM, patient was just vomiting and retching when he had new onset of left-sided chest pain anterior.  That seem to be severe.  It cannot be ascertained if there was any radiation or what the nature of the pain was.  There was no associated shortness of breath cough no fever no palpitation no presyncope no loss of consciousness no trauma.  Patient was initially taken to [facility], ER with above complaints. There was no belly pain.  Given that the CAT scan at the outside ER was not working, patient is brought to [facility] ER.  Patient has been diagnosed with sepsis and started on IV antibiotics.  Medical evaluation is sought  At this time patient offers no complaints Review of Systems: As mentioned in the history of present illness. All other  systems reviewed and are negative. Past Medical History:  Diagnosis Date   Arthritis    Carotid artery occlusion    Chronic kidney disease    DVT (deep venous thrombosis) (HCC) 2008   GERD (gastroesophageal reflux disease)    takes Protonix daily   Gout    takes Allopurinol daily and Indomethacin prn   H/O renal artery stenosis    H/O: GI bleed    History of blood transfusion    History of colon polyps    History of gastric ulcer    Hyperlipidemia    takes Lipitor daily   Hypertension    takes Metoprolol and Amlodipine daily   Joint pain    Joint swelling    Sleep apnea    Past Surgical History:  Procedure Laterality Date   CARDIOVERSION N/A 11/18/2022   Procedure: CARDIOVERSION;  Surgeon: Chilton Si, MD;  Location: [facility] INVASIVE CV LAB;  Service: Cardiovascular;  Laterality: N/A;   CAROTID ENDARTERECTOMY  2008   right CEA   CHOLECYSTECTOMY  mid 90's   COLON SURGERY     COLONOSCOPY     ENDARTERECTOMY  06/02/2012   Procedure: ENDARTERECTOMY CAROTID;  Surgeon: Nada Libman, MD;  Location: [facility] OR;  Service: Vascular;  Laterality: Left;   ESOPHAGOGASTRODUODENOSCOPY     HERNIA REPAIR     umbilical hernia   PATCH ANGIOPLASTY  06/02/2012   Procedure: PATCH ANGIOPLASTY;  Surgeon: Nada Libman, MD;  Location: [facility] OR;  Service: Vascular;  Laterality: Left;  using Vascu-Guard Patch   PERIPHERAL VASCULAR CATHETERIZATION Right 12/03/2015   Procedure: Peripheral Vascular Balloon Angioplasty;  Surgeon: Nada Libman, MD;  Location: [facility] INVASIVE CV LAB;  Service: Cardiovascular;  Laterality: Right;  RENAL unsuccessful   RENAL ARTERY STENT  2009   Right renal artery stenting-  Right Kidney   small intestine removed  2001   d/t random gi bleeding   SUBXYPHOID PERICARDIAL WINDOW N/A 09/23/2022   Procedure: SUBXYPHOID PERICARDIAL WINDOW;  Surgeon: Loreli Slot, MD;  Location: [facility] OR;  Service: Thoracic;  Laterality: N/A;   TEE WITHOUT CARDIOVERSION N/A 09/23/2022   Procedure:  TRANSESOPHAGEAL ECHOCARDIOGRAM;  Surgeon: Loreli Slot, MD;  Location: [facility] OR;  Service: Thoracic;  Laterality: N/A;   Social History:  reports that he quit smoking about 23 years ago. His smoking use included cigarettes. He has a 54.00 pack-year smoking history. He has never used smokeless tobacco. He reports that he does not drink alcohol and does not use drugs.  Allergies  Allergen Reactions   Plavix [Clopidogrel Bisulfate] Other (See Comments)    GI Bleed   Uloric [Febuxostat] Other (See Comments)    Unknown reaction    Family History  Problem Relation Age of Onset   Hypertension Mother    Hyperlipidemia Mother    Other Mother        varicose veins   Hyperlipidemia Father    Hypertension Father    Diabetes Father    Clotting disorder Father    Deep vein thrombosis Father    Hypertension Sister    Diabetes Sister    Hyperlipidemia Sister    Varicose Veins Sister    Other Sister        Bleeding problems   Hypertension Brother    Hyperlipidemia Brother     Prior to Admission medications   Medication Sig Start Date End Date Taking? Authorizing Provider  allopurinol (ZYLOPRIM) 100 MG tablet Take 1 tablet (100 mg total) by mouth 2 (two) times daily. 09/28/22  Yes Barrett, Erin R, PA-C  amLODipine-atorvastatin (CADUET) 10-40 MG tablet Take 1 tablet by mouth every evening. 09/28/22  Yes Barrett, Erin R, PA-C  apixaban (ELIQUIS) 5 MG TABS tablet Take 1 tablet (5 mg total) by mouth 2 (two) times daily. 12/22/22  Yes Eustace Pen, PA-C  aspirin EC 81 MG tablet Take 1 tablet (81 mg total) by mouth daily. Swallow whole. 09/28/22  Yes Barrett, Rae Roam, PA-C  Cholecalciferol (VITAMIN D3) 50 MCG (2000 UT) TABS Take 2,000 Units by mouth daily.   Yes [provider]  cloNIDine (CATAPRES) 0.3 MG tablet Take 0.3 mg by mouth 3 (three) times daily.   Yes [provider]  donepezil (ARICEPT) 5 MG tablet Take 1 tablet (5 mg total) by mouth at bedtime. 02/13/22  Yes  [name], [name], MD  doxazosin (CARDURA) 2 MG tablet Take 4 mg by mouth 2 (two) times daily. 08/24/19  Yes [provider]  ferrous sulfate 325 (65 FE) MG tablet Take 325 mg by mouth in the morning and at bedtime.   Yes [provider]  furosemide (LASIX) 80 MG tablet Taking 120 mg by mouth in the am and 80 mg in the evening   Yes [provider]  metoprolol tartrate (LOPRESSOR) 25 MG tablet Take 1 tablet (25 mg total) by mouth 2 (two) times daily. 12/22/22  Yes Eustace Pen, PA-C  potassium chloride (MICRO-K) 10 MEQ CR capsule Take 10 mEq by mouth daily.  12/10/19  Yes [provider]  sertraline (ZOLOFT) 50 MG tablet Take 50 mg by mouth daily.   Yes [provider]    Physical Exam: Vitals:   01/15/23 2119 01/15/23 2200 01/15/23 2300 01/15/23 2305  BP: (!) 175/87 (!) 169/83  (!) 158/86  Pulse: (!) 120 (!) 122 (!) 113 (!) 112  Resp:   14 16  Temp: (!) 97.4 F (36.3 C)     TempSrc: Oral     SpO2: 96% 99% 95% 94%  Weight:      Height:       General: Patient is alert awake, sitting on stretcher, does not appear to be distressed, he is able to give name and date of birth, however has trouble recalling details of his history of presenting illness. Respiratory exam: Bilateral intravesicular Cardiovascular exam S1-S2 normal Abdomen bowel sounds normal all quadrants soft nontender Extremities warm without edema, left upper extremity has edema and bruising, see representative photo Distal function is intact Some bruising is present also on the right upper extremity and left medial knee.  However that is less impressive. No edema elsewhere.   Media Information  Document Information  Photos  Left arm  01/15/2023 23:55  Attached To:  Hospital Encounter on 01/15/23  Source Information  Nolberto Hanlon, MD  [facility]   Data Reviewed:  Labs on Admission:  Results for orders placed or performed during the hospital encounter of  01/15/23 (from the past 24 hour(s))  Culture, blood (Routine x 2)     Status: None (Preliminary result)   Collection Time: 01/15/23  6:01 PM   Specimen: BLOOD  Result Value Ref Range   Specimen Description BLOOD RIGHT ANTECUBITAL    Special Requests      BOTTLES DRAWN AEROBIC AND ANAEROBIC Blood Culture results may not be optimal due to an inadequate volume of blood received in culture bottles Performed at [facility], [address], [place], [place] [address]    Culture PENDING    Report Status PENDING   Culture, blood (Routine x 2)     Status: None (Preliminary result)   Collection Time: 01/15/23  6:05 PM   Specimen: BLOOD  Result Value Ref Range   Specimen Description BLOOD BLOOD RIGHT FOREARM    Special Requests      BOTTLES DRAWN AEROBIC AND ANAEROBIC Blood Culture adequate volume Performed at [facility], [address] [place], [place] [address]    Culture PENDING    Report Status PENDING   Comprehensive metabolic panel     Status: Abnormal   Collection Time: 01/15/23  6:06 PM  Result Value Ref Range   Sodium 137 135 - 145 mmol/L   Potassium 2.4 (LL) 3.5 - 5.1 mmol/L   Chloride 96 (L) 98 - 111 mmol/L   CO2 20 (L) 22 - 32 mmol/L   Glucose, Bld 190 (H) 70 - 99 mg/dL   BUN 27 (H) 8 - 23 mg/dL   Creatinine, Ser 8.29 (H) 0.61 - 1.24 mg/dL   Calcium 9.0 8.9 - 56.2 mg/dL   Total Protein 7.3 6.5 - 8.1 g/dL   Albumin 3.4 (L) 3.5 - 5.0 g/dL   AST 35 15 - 41 U/L   ALT 14 0 - 44 U/L   Alkaline Phosphatase 182 (H) 38 - 126 U/L   Total Bilirubin 1.7 (H) 0.3 - 1.2 mg/dL   GFR, Estimated 25 (L) >60 mL/min   Anion gap 21 (H) 5 - 15  Lactic acid, plasma  Status: Abnormal   Collection Time: 01/15/23  6:06 PM  Result Value Ref Range   Lactic Acid, Venous 4.6 (HH) 0.5 - 1.9 mmol/L  CBC with Differential     Status: Abnormal   Collection Time: 01/15/23  6:06 PM  Result Value Ref Range   WBC 9.2 4.0 - 10.5 K/uL   RBC 3.52 (L) 4.22 - 5.81 MIL/uL   Hemoglobin 9.4 (L) 13.0 - 17.0  g/dL   HCT 47.8 (L) 29.5 - 62.1 %   MCV 79.3 (L) 80.0 - 100.0 fL   MCH 26.7 26.0 - 34.0 pg   MCHC 33.7 30.0 - 36.0 g/dL   RDW 30.8 65.7 - 84.6 %   Platelets 353 150 - 400 K/uL   nRBC 0.0 0.0 - 0.2 %   Neutrophils Relative % 89 %   Neutro Abs 8.2 (H) 1.7 - 7.7 K/uL   Lymphocytes Relative 5 %   Lymphs Abs 0.4 (L) 0.7 - 4.0 K/uL   Monocytes Relative 5 %   Monocytes Absolute 0.5 0.1 - 1.0 K/uL   Eosinophils Relative 0 %   Eosinophils Absolute 0.0 0.0 - 0.5 K/uL   Basophils Relative 1 %   Basophils Absolute 0.1 0.0 - 0.1 K/uL   Immature Granulocytes 0 %   Abs Immature Granulocytes 0.04 0.00 - 0.07 K/uL  Protime-INR     Status: Abnormal   Collection Time: 01/15/23  6:06 PM  Result Value Ref Range   Prothrombin Time 21.1 (H) 11.4 - 15.2 seconds   INR 1.8 (H) 0.8 - 1.2  Troponin I (High Sensitivity)     Status: Abnormal   Collection Time: 01/15/23  6:06 PM  Result Value Ref Range   Troponin I (High Sensitivity) 36 (H) <18 ng/L  Magnesium     Status: None   Collection Time: 01/15/23  6:06 PM  Result Value Ref Range   Magnesium 1.8 1.7 - 2.4 mg/dL  Lactic acid, plasma     Status: Abnormal   Collection Time: 01/15/23  7:48 PM  Result Value Ref Range   Lactic Acid, Venous 5.5 (HH) 0.5 - 1.9 mmol/L  Troponin I (High Sensitivity)     Status: Abnormal   Collection Time: 01/15/23  7:48 PM  Result Value Ref Range   Troponin I (High Sensitivity) 34 (H) <18 ng/L  Lipase, blood     Status: None   Collection Time: 01/15/23  7:48 PM  Result Value Ref Range   Lipase 28 11 - 51 U/L  Urinalysis, w/ Reflex to Culture (Infection Suspected) -Urine, Clean Catch     Status: Abnormal   Collection Time: 01/15/23  9:29 PM  Result Value Ref Range   Specimen Source URINE, CATHETERIZED    Color, Urine YELLOW YELLOW   APPearance CLEAR CLEAR   Specific Gravity, Urine 1.006 1.005 - 1.030   pH 7.0 5.0 - 8.0   Glucose, UA NEGATIVE NEGATIVE mg/dL   Hgb urine dipstick MODERATE (A) NEGATIVE   Bilirubin  Urine NEGATIVE NEGATIVE   Ketones, ur 5 (A) NEGATIVE mg/dL   Protein, ur 30 (A) NEGATIVE mg/dL   Nitrite NEGATIVE NEGATIVE   Leukocytes,Ua NEGATIVE NEGATIVE   RBC / HPF 0-5 0 - 5 RBC/hpf   WBC, UA 6-10 0 - 5 WBC/hpf   Bacteria, UA NONE SEEN NONE SEEN   Squamous Epithelial / HPF 0-5 0 - 5 /HPF   Hyaline Casts, UA PRESENT   Lactic acid, plasma     Status: Abnormal   Collection Time: 01/15/23  10:40 PM  Result Value Ref Range   Lactic Acid, Venous 3.6 (HH) 0.5 - 1.9 mmol/L   Basic Metabolic Panel: Recent Labs  Lab 01/15/23 1806  NA 137  K 2.4*  CL 96*  CO2 20*  GLUCOSE 190*  BUN 27*  CREATININE 2.59*  CALCIUM 9.0  MG 1.8   Liver Function Tests: Recent Labs  Lab 01/15/23 1806  AST 35  ALT 14  ALKPHOS 182*  BILITOT 1.7*  PROT 7.3  ALBUMIN 3.4*   Recent Labs  Lab 01/15/23 1948  LIPASE 28   No results for input(s): "AMMONIA" in the last 168 hours. CBC: Recent Labs  Lab 01/15/23 1806  WBC 9.2  NEUTROABS 8.2*  HGB 9.4*  HCT 27.9*  MCV 79.3*  PLT 353   Cardiac Enzymes: Recent Labs  Lab 01/15/23 1806 01/15/23 1948  TROPONINIHS 36* 34*    BNP (last 3 results) No results for input(s): "PROBNP" in the last 8760 hours. CBG: No results for input(s): "GLUCAP" in the last 168 hours.  Radiological Exams on Admission:  CT CHEST ABDOMEN PELVIS WO CONTRAST  Result Date: 01/15/2023 CLINICAL DATA:  sepsis, lactic acidosis. abdominal pain EXAM: CT CHEST, ABDOMEN AND PELVIS WITHOUT CONTRAST TECHNIQUE: Multidetector CT imaging of the chest, abdomen and pelvis was performed following the standard protocol without IV contrast. RADIATION DOSE REDUCTION: This exam was performed according to the departmental dose-optimization program which includes automated exposure control, adjustment of the mA and/or kV according to patient size and/or use of iterative reconstruction technique. COMPARISON:  CT chest abdomen pelvis 08/30/2022 FINDINGS: CT CHEST FINDINGS Cardiovascular:  Normal heart size. Interval decrease in size of a trace pericardial effusion. The thoracic aorta is normal in caliber. Severe atherosclerotic plaque of the thoracic aorta. Four-vessel coronary artery calcifications. The main pulmonary artery is normal in caliber. Mediastinum/Nodes: No enlarged mediastinal, hilar, or axillary lymph nodes. Thyroid gland, trachea, and esophagus demonstrate no significant findings. Lungs/Pleura: Hyperinflation of the lungs with biapical trace paraseptal emphysematous changes. Biapical pleural/pulmonary scarring. Diffuse bronchial wall thickening. Peripheral left upper lobe 9 x 8 mm ground-glass airspace opacity (4:22). Few vague scattered peribronchovascular ground-glass airspace opacities within the right upper lobe. No pulmonary mass. Bilateral trace pleural effusions. No pneumothorax. Musculoskeletal: Bilateral gynecomastia. No suspicious lytic or blastic osseous lesions. No acute displaced fracture. Multilevel degenerative changes of the spine. CT ABDOMEN PELVIS FINDINGS Hepatobiliary: No focal liver abnormality. Status post cholecystectomy. No biliary dilatation. Pancreas: No focal lesion. Normal pancreatic contour. No surrounding inflammatory changes. No main pancreatic ductal dilatation. Spleen: Normal in size without focal abnormality. Adrenals/Urinary Tract: No adrenal nodule bilaterally. No nephrolithiasis and no hydronephrosis. 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 Subcentimeter hypodensities are too small to characterize-no further follow-up indicated. No ureterolithiasis or hydroureter. The urinary bladder is unremarkable. Stomach/Bowel: Stomach is within normal limits. No evidence of bowel wall thickening or dilatation. Colonic diverticulosis. Appendix appears normal. Vascular/Lymphatic: No abdominal aorta or iliac aneurysm. Severe atherosclerotic plaque of the aorta and its branches.  No abdominal, pelvic, or inguinal lymphadenopathy. Reproductive: Prostate is unremarkable. Other: No intraperitoneal free fluid. No intraperitoneal free gas. No organized fluid collection. Musculoskeletal: No abdominal wall hernia or abnormality. No suspicious lytic or blastic osseous lesions. No acute displaced fracture. Multilevel degenerative changes of the spine. IMPRESSION: 1. Diffuse bronchial wall thickening with associated persistent vague scattered peribronchovascular ground-glass airspace opacities within the right upper lobe that may represent infection/inflammation. Interval increase in size of a  peripheral left upper lobe 9 mm (from 7 mm) ground-glass pulmonary nodule. Adenocarcinoma is not excluded. Additional imaging evaluation or consultation with Pulmonology or Thoracic Surgery recommended. 2. Bilateral trace pleural effusions. 3. Colonic diverticulosis with no acute diverticulitis. 4. Aortic Atherosclerosis (ICD10-I70.0) including four-vessel coronary calcification. Emphysema (ICD10-J43.9). Electronically Signed   By: Tish Frederickson M.D.   On: 01/15/2023 22:52   DG Abd Portable 1V  Result Date: 01/15/2023 CLINICAL DATA:  One day history of EXAM: PORTABLE ABDOMEN - 1 VIEW COMPARISON:  CT abdomen and pelvis dated 08/30/2022 FINDINGS: Relative paucity of bowel gas. No free air or pneumatosis. No abnormal radio-opaque calculi or mass effect. No acute or substantial osseous abnormality. The sacrum and coccyx are partially obscured by overlying bowel contents. Right upper quadrant cholecystectomy clips. IMPRESSION: Relative paucity of bowel gas. No free air. Electronically Signed   By: Agustin Cree M.D.   On: 01/15/2023 18:24   DG Chest Port 1 View  Result Date: 01/15/2023 CLINICAL DATA:  Pain EXAM: PORTABLE CHEST 1 VIEW COMPARISON:  Chest radiograph dated 10/13/2022 FINDINGS: Normal lung volumes. No focal consolidations. No pleural effusion or pneumothorax. Similar mildly enlarged  cardiomediastinal silhouette. No acute osseous abnormality. IMPRESSION: 1.  No focal consolidations. 2. Similar mildly enlarged cardiomediastinal silhouette. Electronically Signed   By: Agustin Cree M.D.   On: 01/15/2023 18:23    EKG: Independently reviewed. Afib rvr.   Assessment and Plan: * Enteritis Check stool for C. difficile and pathogen.  Treat with Unasyn.  S/p vancomycin and cefepime and metronidazole  Lactic acidosis Patient likely had sepsis. Althogut no fever, no white count. S/p 3 liter fluids. At this time hold furhter fluid, check lactate again, if down trending, do no plan on more fluids tonight as atpient tends to get fluids overloaded.  CAP (community acquired pneumonia) Check COVID-19, check, influenza.  Blood cultures pending.  Treat with Unasyn and azithromycin.  Concern for aspiration, given that patient was vomiting.  Persistent atrial fibrillation (HCC) With RVR, now better rate controlled.  I will hold off on apixaban till patient's left arm ultrasound is completed.  Continue on telemetry.  Hypokalemia Severe.  Give additional 60 mEq of KCl p.o. now, got 20 mg of KCl IV in the ER earlier.  Recheck potassium right now further administration of KCl accordingly.  Check magnesium  CKD (chronic kidney disease) stage 4, GFR 15-29 ml/min (HCC) Fattening appears to be at baseline.  Patient describes having chronic fluid overload.  Given left arm edema and bruising, I will check an ultrasound over there.  And make sure no active bleeding.  Pulmonary nodules  Interval increase in size of a peripheral left upper lobe 9 mm (from 7 mm) ground-glass pulmonary nodule. Adenocarcinoma is not excluded. Additional imaging evaluation or consultation with Pulmonology or Thoracic Surgery recommended.   Chest pain This was related to patient having vomiting, it has since then resolved.  Troponins are flat, CT scan shows no pneumothorax.  We will continue to monitor clinically, patient is  not having vomiting at this time      Advance Care Planning:   Code Status: Prior full code  Consults: None  Family Communication: Son and wife at bedside  Severity of Illness: The appropriate patient status for this patient is INPATIENT. Inpatient status is judged to be reasonable and necessary in order to provide the required intensity of service to ensure the patient's safety. The patient's presenting symptoms, physical exam findings, and initial radiographic and laboratory data in the context  of their chronic comorbidities is felt to place them at high risk for further clinical deterioration. Furthermore, it is not anticipated that the patient will be medically stable for discharge from the hospital within 2 midnights of admission.   * I certify that at the point of admission it is my clinical judgment that the patient will require inpatient hospital care spanning beyond 2 midnights from the point of admission due to high intensity of service, high risk for further deterioration and high frequency of surveillance required.*  Author: Nolberto Hanlon, MD 01/16/2023 12:03 AM  For on call review [email or web address].

## 2023-01-16 NOTE — Progress Notes (Signed)
Progress Note   Patient: James [name] WJX:[id number] DOB: Sep 15, 1945 DOA: 01/15/2023     0 DOS: the patient was seen and examined on 01/16/2023   Brief hospital course:  James [name] is a 77 y.o. male with medical history significant of dementia.  Therefore patient is unable to provide detailed account of his recent illness.  Fortunately we have the patient's son at the bedside as well as his wife who are giving history.  Patient seems to have been in his usual state of health till yesterday morning when patient threw up after breakfast at approximately 11 AM.  Thereafter patient had poor appetite all day yesterday and seemed a bit lethargic.  Since this morning patient has had at least 4 episodes of vomiting which are small nonbloody consisting of ingested food versus bile.  Associated with multiple loose bowel movements.  The color of the bowel movements is not known, the exact number of bowel movements is not known but is estimated at approximately 4 today.  Patient did not complain of any abdominal pain.  At approximately 3 PM, patient was just vomiting and retching when he had new onset of left-sided chest pain anterior.  That seem to be severe.  It cannot be ascertained if there was any radiation or what the nature of the pain was.  There was no associated shortness of breath cough no fever no palpitation no presyncope no loss of consciousness no trauma.  Patient was initially taken to [facility], ER with above complaints. Given that the CAT scan at the outside ER was not working, patient is brought to [facility] ER.  Patient has been diagnosed with sepsis and started on IV antibiotics.  Medical evaluation is sought  Assessment and Plan:  AHRF on 5 L O2 CAP (community acquired pneumonia) vs. Aspiration PNA Patient not on oxygen as outpatient. COVID, RVP negative. CT chest unremarkable aside from trace pleural effusions, no PTX, diffuse bronchial wall thickening with scattered GGO in RUL.  Concern for aspiration pneumonitis, given that patient was vomiting. Hx of pericardial effusion s/p window 08/2022 so unlikely recurrent effusion causing his symptoms but will obtain TTE to ensure no complications causing his symptoms.  -Treat with Unasyn and azithromycin.   -follow up TTE -lasix 80 mg BID  Enteritis Check stool for C. difficile and pathogen. S/p vancomycin and cefepime and metronidazole. CT unremarkable.  -ADAT  Persistent atrial fibrillation (HCC) With RVR, now better rate controlled. A/C initially held for possible hematoma to right arm.  -continue metoprolol tartrate 25 mg BID -continue eliquis 5 mg BID - Continue on telemetry.  HTN -clonidine 0.3 mg TID -amlodipine 10 mg at bedtime --lasix 80 mg BID  CKD (chronic kidney disease) stage 4, GFR 15-29 ml/min (HCC) Cr appears to be at baseline.  Patient describes having chronic fluid overload.   -lasix 80 mg BID  Hypokalemia, resolved Severe.  Given repletion and resolved.   Pulmonary nodules Interval increase in size of a peripheral left upper lobe 9 mm (from 7 mm) ground-glass pulmonary nodule. Adenocarcinoma is not excluded. Additional imaging evaluation or consultation with Pulmonology or Thoracic Surgery recommended.  -outpatient follow up  Hx of bilateral cartoid endarterectomy -on aspirin and statin  Dementia -donepezil 5 mg qhs     Subjective: NAOE. Pt denies any complaints, knows where he is and month/day but not year. Not oriented to situation.  Physical Exam: Vitals:   01/16/23 0536 01/16/23 0906 01/16/23 0925 01/16/23 1207  BP: 132/74 (!) 152/69  126/69  Pulse: 93 (!) 103  86  Resp: 16 16  18   Temp: 97.9 F (36.6 C) 98 F (36.7 C)  97.9 F (36.6 C)  TempSrc: Oral Oral  Oral  SpO2: 94% 90% (!) 80% 94%  Weight:      Height:       Physical Exam Vitals and nursing note reviewed.  Constitutional:      Appearance: He is ill-appearing. He is not diaphoretic.  HENT:     Head:  Normocephalic and atraumatic.  Eyes:     Pupils: Pupils are equal, round, and reactive to light.  Cardiovascular:     Rate and Rhythm: Normal rate. Rhythm irregular.     Pulses: Normal pulses.     Heart sounds: No murmur heard. Pulmonary:     Effort: Pulmonary effort is normal. No respiratory distress.     Breath sounds: Wheezing and rhonchi present.  Abdominal:     General: Abdomen is flat. There is no distension.     Palpations: Abdomen is soft.     Tenderness: There is no abdominal tenderness. There is no rebound.  Musculoskeletal:     Right lower leg: No edema.     Left lower leg: No edema.  Skin:    General: Skin is warm and dry.     Capillary Refill: Capillary refill takes less than 2 seconds.  Neurological:     Mental Status: He is alert. He is disoriented.  Psychiatric:        Mood and Affect: Mood normal.     Data Reviewed:     Latest Ref Rng & Units 01/16/2023    3:57 AM 01/15/2023    6:06 PM 11/11/2022   10:33 AM  CBC  WBC 4.0 - 10.5 K/uL 11.0  9.2  8.1   Hemoglobin 13.0 - 17.0 g/dL 8.2  9.4  57.8   Hematocrit 39.0 - 52.0 % 25.9  27.9  33.1   Platelets 150 - 400 K/uL 272  353  317       Latest Ref Rng & Units 01/16/2023    3:57 AM 01/16/2023    1:15 AM 01/15/2023    6:06 PM  BMP  Glucose 70 - 99 mg/dL 469   629   BUN 8 - 23 mg/dL 22   27   Creatinine 5.28 - 1.24 mg/dL 4.13   2.44   Sodium 010 - 145 mmol/L 141   137   Potassium 3.5 - 5.1 mmol/L 3.9  2.8  2.4   Chloride 98 - 111 mmol/L 106   96   CO2 22 - 32 mmol/L 22   20   Calcium 8.9 - 10.3 mg/dL 8.2   9.0      Family Communication: d/w family  Disposition: Status is: Inpatient Remains inpatient appropriate because: AHRF on 5 L  Planned Discharge Destination: Home    Time spent: 35 minutes  Author: Charolotte Eke, MD 01/16/2023 3:59 PM  For on call review [email or web address].

## 2023-01-16 NOTE — Assessment & Plan Note (Signed)
Severe.  Give additional 60 mEq of KCl p.o. now, got 20 mg of KCl IV in the ER earlier.  Recheck potassium right now further administration of KCl accordingly.  Check magnesium

## 2023-01-17 ENCOUNTER — Inpatient Hospital Stay (HOSPITAL_COMMUNITY): Payer: Medicare Other

## 2023-01-17 DIAGNOSIS — R0609 Other forms of dyspnea: Secondary | ICD-10-CM | POA: Diagnosis not present

## 2023-01-17 DIAGNOSIS — N184 Chronic kidney disease, stage 4 (severe): Secondary | ICD-10-CM

## 2023-01-17 DIAGNOSIS — K529 Noninfective gastroenteritis and colitis, unspecified: Secondary | ICD-10-CM | POA: Diagnosis not present

## 2023-01-17 DIAGNOSIS — I4819 Other persistent atrial fibrillation: Secondary | ICD-10-CM

## 2023-01-17 DIAGNOSIS — J189 Pneumonia, unspecified organism: Secondary | ICD-10-CM

## 2023-01-17 DIAGNOSIS — E876 Hypokalemia: Secondary | ICD-10-CM | POA: Diagnosis not present

## 2023-01-17 LAB — CBC
HCT: 25.3 % — ABNORMAL LOW (ref 39.0–52.0)
Hemoglobin: 7.9 g/dL — ABNORMAL LOW (ref 13.0–17.0)
MCH: 26.2 pg (ref 26.0–34.0)
MCHC: 31.2 g/dL (ref 30.0–36.0)
MCV: 84.1 fL (ref 80.0–100.0)
Platelets: 274 10*3/uL (ref 150–400)
RBC: 3.01 MIL/uL — ABNORMAL LOW (ref 4.22–5.81)
RDW: 16.1 % — ABNORMAL HIGH (ref 11.5–15.5)
WBC: 13.3 10*3/uL — ABNORMAL HIGH (ref 4.0–10.5)
nRBC: 0 % (ref 0.0–0.2)

## 2023-01-17 LAB — COMPREHENSIVE METABOLIC PANEL
ALT: 13 U/L (ref 0–44)
AST: 26 U/L (ref 15–41)
Albumin: 2.3 g/dL — ABNORMAL LOW (ref 3.5–5.0)
Alkaline Phosphatase: 144 U/L — ABNORMAL HIGH (ref 38–126)
Anion gap: 12 (ref 5–15)
BUN: 26 mg/dL — ABNORMAL HIGH (ref 8–23)
CO2: 23 mmol/L (ref 22–32)
Calcium: 8.1 mg/dL — ABNORMAL LOW (ref 8.9–10.3)
Chloride: 103 mmol/L (ref 98–111)
Creatinine, Ser: 2.3 mg/dL — ABNORMAL HIGH (ref 0.61–1.24)
GFR, Estimated: 29 mL/min — ABNORMAL LOW (ref >60)
Glucose, Bld: 107 mg/dL — ABNORMAL HIGH (ref 70–99)
Potassium: 3.1 mmol/L — ABNORMAL LOW (ref 3.5–5.1)
Sodium: 138 mmol/L (ref 135–145)
Total Bilirubin: 0.9 mg/dL (ref 0.3–1.2)
Total Protein: 5.3 g/dL — ABNORMAL LOW (ref 6.5–8.1)

## 2023-01-17 LAB — BLOOD GAS, ARTERIAL
Acid-Base Excess: 2.9 mmol/L — ABNORMAL HIGH (ref 0.0–2.0)
Bicarbonate: 27.9 mmol/L (ref 20.0–28.0)
O2 Saturation: 94.3 %
Patient temperature: 37
pCO2 arterial: 43 mmHg (ref 32–48)
pH, Arterial: 7.42 (ref 7.35–7.45)
pO2, Arterial: 63 mmHg — ABNORMAL LOW (ref 83–108)

## 2023-01-17 LAB — ECHOCARDIOGRAM COMPLETE
AR max vel: 2.36 cm2
AV Area VTI: 2.36 cm2
AV Area mean vel: 2.17 cm2
AV Mean grad: 2 mmHg
AV Peak grad: 4.3 mmHg
Ao pk vel: 1.04 m/s
Area-P 1/2: 3.53 cm2
Height: 70 in
S' Lateral: 3.3 cm
Weight: 2634.94 oz

## 2023-01-17 LAB — GLUCOSE, CAPILLARY
Glucose-Capillary: 98 mg/dL (ref 70–99)
Glucose-Capillary: 94 mg/dL (ref 70–99)
Glucose-Capillary: 116 mg/dL — ABNORMAL HIGH (ref 70–99)
Glucose-Capillary: 106 mg/dL — ABNORMAL HIGH (ref 70–99)

## 2023-01-17 LAB — CULTURE, BLOOD (ROUTINE X 2): Special Requests: ADEQUATE

## 2023-01-17 LAB — PROCALCITONIN: Procalcitonin: 0.19 ng/mL

## 2023-01-17 MED ORDER — FERROUS SULFATE 325 (65 FE) MG PO TABS
325.0000 mg | ORAL_TABLET | Freq: Every day | ORAL | Status: DC
  Administered 2023-01-18 – 2023-01-19 (×2): 325 mg via ORAL
  Filled 2023-01-17 (×2): qty 1

## 2023-01-17 MED ORDER — POTASSIUM CHLORIDE CRYS ER 10 MEQ PO TBCR
10.0000 meq | EXTENDED_RELEASE_TABLET | Freq: Two times a day (BID) | ORAL | Status: DC
  Administered 2023-01-18 – 2023-01-19 (×3): 10 meq via ORAL
  Filled 2023-01-17 (×3): qty 1

## 2023-01-17 MED ORDER — POTASSIUM CHLORIDE CRYS ER 20 MEQ PO TBCR
40.0000 meq | EXTENDED_RELEASE_TABLET | Freq: Once | ORAL | Status: AC
  Administered 2023-01-17: 40 meq via ORAL
  Filled 2023-01-17: qty 2

## 2023-01-17 NOTE — Progress Notes (Signed)
   01/17/23 0145  BiPAP/CPAP/SIPAP  $ Face Mask Medium Yes  BiPAP/CPAP/SIPAP Pt Type Adult  BiPAP/CPAP/SIPAP Resmed  Mask Type Full face mask  Mask Size Medium  IPAP 12 cmH20  EPAP 5 cmH2O  Pressure Support 2 cmH20  Flow Rate 6 lpm  Patient Home Equipment No

## 2023-01-17 NOTE — Progress Notes (Signed)
Placed patient on cpap due to him desating into 80"s.

## 2023-01-17 NOTE — Progress Notes (Signed)
Progress Note   Patient: James [name] UJW:[id number] DOB: 1946/03/17 DOA: 01/15/2023     1 DOS: the patient was seen and examined on 01/17/2023   Brief hospital course:  GARRETH [name] is a 77 y.o. male with medical history significant of dementia.  Therefore patient is unable to provide detailed account of his recent illness.  Fortunately we have the patient's son at the bedside as well as his wife who are giving history.  Patient seems to have been in his usual state of health till yesterday morning when patient threw up after breakfast at approximately 11 AM.  Thereafter patient had poor appetite all day yesterday and seemed a bit lethargic.  Since this morning patient has had at least 4 episodes of vomiting which are small nonbloody consisting of ingested food versus bile.  Associated with multiple loose bowel movements.  The color of the bowel movements is not known, the exact number of bowel movements is not known but is estimated at approximately 4 today.  Patient did not complain of any abdominal pain.  At approximately 3 PM, patient was just vomiting and retching when he had new onset of left-sided chest pain anterior.  That seem to be severe.  It cannot be ascertained if there was any radiation or what the nature of the pain was.  There was no associated shortness of breath cough no fever no palpitation no presyncope no loss of consciousness no trauma.  Patient was initially taken to [facility], ER with above complaints. Given that the CAT scan at the outside ER was not working, patient is brought to [facility] ER.  Patient has been diagnosed with sepsis and started on IV antibiotics.  Medical evaluation is sought  Assessment and Plan:  Acute Hypoxic Respiratory Failure initially requiring 5 L/min O2 CAP (community acquired pneumonia) vs. Aspiration PNA Patient not on oxygen as outpatient. COVID, RVP negative. CT chest unremarkable aside from trace pleural effusions, no PTX, diffuse  bronchial wall thickening with scattered GGO in RUL. Concern for aspiration pneumonitis, given that patient was vomiting. Hx of pericardial effusion s/p window 08/2022 so unlikely recurrent effusion causing his symptoms but will obtain TTE to ensure no complications causing his symptoms.  -continue Unasyn and azithromycin.   -follow up TTE : LVEF 50-55% with RMWAs, moderate pericardial effusion -continue lasix 80 mg BID  Enteritis Check stool for C. difficile and pathogen. S/p vancomycin and cefepime and metronidazole. CT unremarkable.  -ADAT  Persistent atrial fibrillation  With RVR, now better rate controlled. A/C initially held for possible hematoma to right arm.  -continue metoprolol tartrate 25 mg BID -continue eliquis 5 mg BID for full anticoagulation -Continue on telemetry.  HTN - controlled  -clonidine 0.3 mg TID -amlodipine 10 mg at bedtime --lasix 80 mg BID  CKD (chronic kidney disease) stage 4, GFR 15-29 ml/min  Cr appears to be at baseline.  Patient describes having chronic fluid overload.   -lasix 80 mg BID -monitor renal function panel  Hypokalemia, resolved Severe.  Given repletion and improved to 3.1, additional K given 6/23 Recheck in AM   Pulmonary nodules Interval increase in size of a peripheral left upper lobe 9 mm (from 7 mm) ground-glass pulmonary nodule. Adenocarcinoma is not excluded. Additional imaging evaluation or consultation with Pulmonology or Thoracic Surgery recommended.  -outpatient follow up  Hx of bilateral cartoid endarterectomy -on aspirin and statin  Dementia -donepezil 5 mg qhs     Subjective: overall starting to feel better, ready to try  and sit up in a chair.  PT eval pending.   Physical Exam: Vitals:   01/17/23 0745 01/17/23 0900 01/17/23 0936 01/17/23 1320  BP: (!) 145/74   133/78  Pulse: 99   80  Resp: 17   20  Temp:    97.9 F (36.6 C)  TempSrc:      SpO2: 98% 96% 97% (!) 82%  Weight:      Height:       Physical  Exam Vitals and nursing note reviewed.  Constitutional:      General: He is not in acute distress.    Appearance: He is not ill-appearing, toxic-appearing or diaphoretic.  HENT:     Head: Normocephalic and atraumatic.     Nose: Nose normal.     Mouth/Throat:     Mouth: Mucous membranes are moist.  Eyes:     Extraocular Movements: Extraocular movements intact.     Pupils: Pupils are equal, round, and reactive to light.  Cardiovascular:     Rate and Rhythm: Normal rate. Rhythm irregular.     Pulses: Normal pulses.     Heart sounds: Normal heart sounds. No murmur heard. Pulmonary:     Effort: Pulmonary effort is normal. No respiratory distress.     Breath sounds: Normal breath sounds. No stridor.  Abdominal:     General: Abdomen is flat. Bowel sounds are normal. There is no distension.     Palpations: Abdomen is soft. There is no mass.     Tenderness: There is no abdominal tenderness. There is no rebound.  Musculoskeletal:     Cervical back: Normal range of motion and neck supple. No tenderness.     Right lower leg: No edema.     Left lower leg: No edema.  Skin:    General: Skin is warm and dry.     Capillary Refill: Capillary refill takes less than 2 seconds.  Neurological:     Mental Status: He is alert. He is disoriented.  Psychiatric:        Mood and Affect: Mood normal.        Thought Content: Thought content normal.   Data Reviewed:     Latest Ref Rng & Units 01/17/2023   12:52 AM 01/16/2023    3:57 AM 01/15/2023    6:06 PM  CBC  WBC 4.0 - 10.5 K/uL 13.3  11.0  9.2   Hemoglobin 13.0 - 17.0 g/dL 7.9  8.2  9.4   Hematocrit 39.0 - 52.0 % 25.3  25.9  27.9   Platelets 150 - 400 K/uL 274  272  353       Latest Ref Rng & Units 01/17/2023   12:52 AM 01/16/2023    3:57 AM 01/16/2023    1:15 AM  BMP  Glucose 70 - 99 mg/dL 161  096    BUN 8 - 23 mg/dL 26  22    Creatinine 0.45 - 1.24 mg/dL 4.09  8.11    Sodium 914 - 145 mmol/L 138  141    Potassium 3.5 - 5.1 mmol/L 3.1   3.9  2.8   Chloride 98 - 111 mmol/L 103  106    CO2 22 - 32 mmol/L 23  22    Calcium 8.9 - 10.3 mg/dL 8.1  8.2     Family Communication: d/w family  Disposition: Status is: Inpatient Remains inpatient appropriate because: AHRF on 5 L  Planned Discharge Destination: Home    Time spent: 35 minutes  Author: [employer]  Laural Benes, MD 01/17/2023 1:52 PM How to contact the [facility] Attending or Consulting provider 7A - 7P or covering provider during after hours 7P -7A, for this patient?  Check the care team in [facility] and look for a) attending/consulting TRH provider listed and b) the [facility] team listed Log into [email or web address] and use [facility]'s universal password to access. If you do not have the password, please contact the hospital operator. Locate the [facility] provider you are looking for under [facility] and page to a number that you can be directly reached. If you still have difficulty reaching the provider, please page the [facility] (Director on Call) for the Hospitalists listed on amion for assistance.  For on call review [email or web address].

## 2023-01-17 NOTE — Progress Notes (Signed)
Pt has been having frequent episodes of desaturation in O2 levels, ranging from the high 70's-80's while sleeping He has been on nasal cannula at 5L for the shift and increased to 6L before notifying respiratory to come and apply CPAP per md orders Pt still continued to drop in oxygen levels during sleeping hours Pt remains asymptomatic, no c/o sob/dyspnea, no s/s or c/o pain or discomfort  He remains alert and oriented throughout the changes in Spo2 levels MD was notified of these episodes and he ordered ABG so respiratory therapist was notified and came to collect the specimen I will continue to monitor for changes in status and note changes accordingly

## 2023-01-18 ENCOUNTER — Encounter (HOSPITAL_COMMUNITY): Payer: Medicare Other

## 2023-01-18 ENCOUNTER — Inpatient Hospital Stay (HOSPITAL_COMMUNITY): Payer: Medicare Other

## 2023-01-18 DIAGNOSIS — K529 Noninfective gastroenteritis and colitis, unspecified: Secondary | ICD-10-CM | POA: Diagnosis not present

## 2023-01-18 LAB — CBC
HCT: 25.8 % — ABNORMAL LOW (ref 39.0–52.0)
Hemoglobin: 8 g/dL — ABNORMAL LOW (ref 13.0–17.0)
MCH: 25.9 pg — ABNORMAL LOW (ref 26.0–34.0)
MCHC: 31 g/dL (ref 30.0–36.0)
MCV: 83.5 fL (ref 80.0–100.0)
Platelets: 266 10*3/uL (ref 150–400)
RBC: 3.09 MIL/uL — ABNORMAL LOW (ref 4.22–5.81)
RDW: 15.9 % — ABNORMAL HIGH (ref 11.5–15.5)
WBC: 10.5 10*3/uL (ref 4.0–10.5)
nRBC: 0 % (ref 0.0–0.2)

## 2023-01-18 LAB — RENAL FUNCTION PANEL
Albumin: 2.3 g/dL — ABNORMAL LOW (ref 3.5–5.0)
Anion gap: 9 (ref 5–15)
BUN: 28 mg/dL — ABNORMAL HIGH (ref 8–23)
CO2: 26 mmol/L (ref 22–32)
Calcium: 7.8 mg/dL — ABNORMAL LOW (ref 8.9–10.3)
Chloride: 100 mmol/L (ref 98–111)
Creatinine, Ser: 2.54 mg/dL — ABNORMAL HIGH (ref 0.61–1.24)
GFR, Estimated: 25 mL/min — ABNORMAL LOW (ref >60)
Glucose, Bld: 116 mg/dL — ABNORMAL HIGH (ref 70–99)
Phosphorus: 2.7 mg/dL (ref 2.5–4.6)
Potassium: 3.1 mmol/L — ABNORMAL LOW (ref 3.5–5.1)
Sodium: 135 mmol/L (ref 135–145)

## 2023-01-18 LAB — PROTEIN ELECTROPHORESIS, SERUM
A/G Ratio: 1 (ref 0.7–1.7)
Albumin ELP: 3 g/dL (ref 2.9–4.4)
Alpha-1-Globulin: 0.3 g/dL (ref 0.0–0.4)
Alpha-2-Globulin: 0.8 g/dL (ref 0.4–1.0)
Beta Globulin: 0.9 g/dL (ref 0.7–1.3)
Gamma Globulin: 1.1 g/dL (ref 0.4–1.8)
Globulin, Total: 3.1 g/dL (ref 2.2–3.9)
Total Protein ELP: 6.1 g/dL (ref 6.0–8.5)

## 2023-01-18 LAB — MAGNESIUM: Magnesium: 1.5 mg/dL — ABNORMAL LOW (ref 1.7–2.4)

## 2023-01-18 LAB — CULTURE, BLOOD (ROUTINE X 2)

## 2023-01-18 LAB — GLUCOSE, CAPILLARY
Glucose-Capillary: 119 mg/dL — ABNORMAL HIGH (ref 70–99)
Glucose-Capillary: 128 mg/dL — ABNORMAL HIGH (ref 70–99)
Glucose-Capillary: 110 mg/dL — ABNORMAL HIGH (ref 70–99)
Glucose-Capillary: 137 mg/dL — ABNORMAL HIGH (ref 70–99)

## 2023-01-18 MED ORDER — POTASSIUM CHLORIDE CRYS ER 20 MEQ PO TBCR
40.0000 meq | EXTENDED_RELEASE_TABLET | Freq: Two times a day (BID) | ORAL | Status: AC
  Administered 2023-01-18 (×2): 40 meq via ORAL
  Filled 2023-01-18 (×2): qty 2

## 2023-01-18 MED ORDER — ALLOPURINOL 100 MG PO TABS
50.0000 mg | ORAL_TABLET | Freq: Every day | ORAL | Status: DC
  Administered 2023-01-18 – 2023-01-19 (×2): 50 mg via ORAL
  Filled 2023-01-18 (×2): qty 1

## 2023-01-18 MED ORDER — MAGNESIUM SULFATE 2 GM/50ML IV SOLN
2.0000 g | Freq: Once | INTRAVENOUS | Status: AC
  Administered 2023-01-18: 2 g via INTRAVENOUS
  Filled 2023-01-18: qty 50

## 2023-01-18 NOTE — Plan of Care (Signed)
  Problem: Metabolic: Goal: Ability to maintain appropriate glucose levels will improve Outcome: Progressing   Problem: Nutritional: Goal: Maintenance of adequate nutrition will improve Outcome: Progressing   Problem: Elimination: Goal: Will not experience complications related to bowel motility Outcome: Progressing Goal: Will not experience complications related to urinary retention Outcome: Progressing   Problem: Nutritional: Goal: Progress toward achieving an optimal weight will improve Outcome: Not Progressing   Problem: Education: Goal: Knowledge of General Education information will improve Description: Including pain rating scale, medication(s)/side effects and non-pharmacologic comfort measures Outcome: Not Progressing   Problem: Health Behavior/Discharge Planning: Goal: Ability to manage health-related needs will improve Outcome: Not Progressing   Problem: Activity: Goal: Risk for activity intolerance will decrease Outcome: Not Progressing   Problem: Coping: Goal: Level of anxiety will decrease Outcome: Not Progressing

## 2023-01-18 NOTE — Evaluation (Signed)
Physical Therapy Evaluation Patient Details Name: James [name] MRN: [id number] DOB: 06/15/1946 Today's Date: 01/18/2023  History of Present Illness  Pt is 77 year old presented to [facility] on  6/21 for enteritis, acute hypoxic respiratory failure. PMH - dementia, afib, HTN, CKD, CEA, DVT  Clinical Impression  Pt mobilizing well independently and doesn't need any further PT services. Due to dementia pt adamantly refusing to wear oxygen while ambulating with SpO2 dropping into mid 80's on return to room. Pt also adamantly refusing to have pulse ox, heart monitor or IV connected with ambulation. Will dc from PT.        Recommendations for follow up therapy are one component of a multi-disciplinary discharge planning process, led by the attending physician.  Recommendations may be updated based on patient status, additional functional criteria and insurance authorization.  Follow Up Recommendations       Assistance Recommended at Discharge Intermittent Supervision/Assistance  Patient can return home with the following  Direct supervision/assist for financial management;Direct supervision/assist for medications management;Assist for transportation    Equipment Recommendations None recommended by PT  Recommendations for Other Services       Functional Status Assessment Patient has not had a recent decline in their functional status     Precautions / Restrictions Precautions Precautions: Other (comment) Precaution Comments: dementia Restrictions Weight Bearing Restrictions: No      Mobility  Bed Mobility Overal bed mobility: Independent                  Transfers Overall transfer level: Independent Equipment used: None                    Ambulation/Gait Ambulation/Gait assistance: Independent Gait Distance (Feet): 200 Feet Assistive device: None Gait Pattern/deviations: WFL(Within Functional Limits)   Gait velocity interpretation: 1.31 - 2.62 ft/sec,  indicative of limited community Teacher, early years/pre [employer] (Stroke Patients Only)       Balance Overall balance assessment: No apparent balance deficits (not formally assessed)                                           Pertinent Vitals/Pain Pain Assessment Pain Assessment: No/denies pain    Home Living Family/patient expects to be discharged to:: Private residence Living Arrangements: Spouse/significant other Available Help at Discharge: Family Type of Home: House Home Access: Stairs to enter   Secretary/administrator of Steps: 4   Home Layout: Multi-level;Able to live on main level with bedroom/bathroom Home Equipment: None      Prior Function Prior Level of Function : Patient poor historian/Family not available             Mobility Comments: Independent without assistive device       Hand Dominance        Extremity/Trunk Assessment   Upper Extremity Assessment Upper Extremity Assessment: Overall WFL for tasks assessed    Lower Extremity Assessment Lower Extremity Assessment: Overall WFL for tasks assessed       Communication   Communication: No difficulties  Cognition Arousal/Alertness: Awake/alert Behavior During Therapy: WFL for tasks assessed/performed Overall Cognitive Status: History of cognitive impairments - at baseline  General Comments General comments (skin integrity, edema, etc.): Pt refused to mobilize with any lines attached (O2, heart monitor, IV)    Exercises     Assessment/Plan    PT Assessment Patient does not need any further PT services  PT Problem List         PT Treatment Interventions      PT Goals (Current goals can be found in the Care Plan section)  Acute Rehab PT Goals PT Goal Formulation: All assessment and education complete, DC therapy    Frequency       Co-evaluation                AM-PAC PT "6 Clicks" Mobility  Outcome Measure Help needed turning from your back to your side while in a flat bed without using bedrails?: None Help needed moving from lying on your back to sitting on the side of a flat bed without using bedrails?: None Help needed moving to and from a bed to a chair (including a wheelchair)?: None Help needed standing up from a chair using your arms (e.g., wheelchair or bedside chair)?: None Help needed to walk in hospital room?: None Help needed climbing 3-5 steps with a railing? : None 6 Click Score: 24    End of Session   Activity Tolerance: Patient tolerated treatment well Patient left: in chair;with chair alarm set Nurse Communication: Mobility status PT Visit Diagnosis: Other (comment)    Time: 7829-5621 PT Time Calculation (min) (ACUTE ONLY): 18 min   Charges:   PT Evaluation $PT Eval Low Complexity: 1 Low          [facility] PT Acute Rehabilitation Services Office [phone]   [facility] 01/18/2023, 2:24 PM

## 2023-01-18 NOTE — Progress Notes (Signed)
   01/18/23 0044  BiPAP/CPAP/SIPAP  BiPAP/CPAP/SIPAP Pt Type Adult  BiPAP/CPAP/SIPAP Resmed  Mask Type Full face mask  Mask Size Medium  Respiratory Rate 20 breaths/min  IPAP 12 cmH20  EPAP 5 cmH2O  Pressure Support 2 cmH20  Flow Rate 6 lpm  Patient Home Equipment No  CPAP/SIPAP surface wiped down Yes

## 2023-01-18 NOTE — Progress Notes (Signed)
Patient ID: James [name], male   DOB: March 23, 1946, 77 y.o.   MRN: [id number]  Patient oxygen saturation on room air at rest 86%.   Patient oxygen saturation on room air during/after ambulation 78%.  Patient oxygen saturation on 2LO2 at rest 96%.  Patient oxygen saturation on 2LO2 during ambulation 89%.  Lidia Collum, RN

## 2023-01-18 NOTE — Progress Notes (Signed)
PROGRESS NOTE    James [name]  WUJ:[id number] DOB: Dec 10, 1945 DOA: 01/15/2023 PCP: [name], [name], MD   Brief Narrative:    James [name] is a 77 y.o. male with medical history significant of dementia.  Therefore patient is unable to provide detailed account of his recent illness.  Fortunately we have the patient's son at the bedside as well as his wife who are giving history.  Patient seems to have been in his usual state of health till yesterday morning when patient threw up after breakfast at approximately 11 AM.  Thereafter patient had poor appetite all day yesterday and seemed a bit lethargic.  Since this morning patient has had at least 4 episodes of vomiting which are small nonbloody consisting of ingested food versus bile.  Associated with multiple loose bowel movements.  The color of the bowel movements is not known, the exact number of bowel movements is not known but is estimated at approximately 4 today.  Patient did not complain of any abdominal pain.  At approximately 3 PM, patient was just vomiting and retching when he had new onset of left-sided chest pain anterior.  That seem to be severe.  It cannot be ascertained if there was any radiation or what the nature of the pain was.  There was no associated shortness of breath cough no fever no palpitation no presyncope no loss of consciousness no trauma.  Patient was initially taken to [facility], ER with above complaints. Given that the CAT scan at the outside ER was not working, patient is brought to [facility] ER.  Patient has been diagnosed with sepsis and started on IV antibiotics.  Medical evaluation is sought.  Assessment & Plan:   Principal Problem:   Enteritis Active Problems:   Chest pain   Pulmonary nodules   CKD (chronic kidney disease) stage 4, GFR 15-29 ml/min (HCC)   Hypokalemia   Persistent atrial fibrillation (HCC)   CAP (community acquired pneumonia)   Lactic acidosis   Community acquired  pneumonia  Assessment and Plan:  Acute Hypoxic Respiratory Failure initially requiring 5 L/min O2 CAP (community acquired pneumonia) vs. Aspiration PNA Patient not on oxygen as outpatient. COVID, RVP negative. CT chest unremarkable aside from trace pleural effusions, no PTX, diffuse bronchial wall thickening with scattered GGO in RUL. Concern for aspiration pneumonitis, given that patient was vomiting. Hx of pericardial effusion s/p window 08/2022 so unlikely recurrent effusion causing his symptoms but will obtain TTE to ensure no complications causing his symptoms.  -continue Unasyn and azithromycin.   -follow up TTE : LVEF 50-55% with RMWAs, moderate pericardial effusion -continue lasix 80 mg BID   Enteritis Check stool for C. difficile and pathogen. S/p vancomycin and cefepime and metronidazole. CT unremarkable.  -ADAT   Persistent atrial fibrillation  With RVR, now better rate controlled. A/C initially held for possible hematoma to right arm.  -continue metoprolol tartrate 25 mg BID -continue eliquis 5 mg BID for full anticoagulation -Continue on telemetry.   HTN - controlled  -clonidine 0.3 mg TID -amlodipine 10 mg at bedtime --lasix 80 mg BID   CKD (chronic kidney disease) stage 4, GFR 15-29 ml/min  Cr appears to be at baseline.  Patient describes having chronic fluid overload.   -lasix 80 mg BID -monitor renal function panel   Hypokalemia/hypomagnesemia Replete and recheck in a.m.   Pulmonary nodules Interval increase in size of a peripheral left upper lobe 9 mm (from 7 mm) ground-glass pulmonary nodule. Adenocarcinoma is not excluded.  Additional imaging evaluation or consultation with Pulmonology or Thoracic Surgery recommended.  -outpatient follow up   Hx of bilateral cartoid endarterectomy -on aspirin and statin   Dementia -donepezil 5 mg qhs  DVT prophylaxis:apixaban Code Status: Full Family Communication: Discussed with son on phone 6/24 Disposition Plan:   Status is: Inpatient Remains inpatient appropriate because: Need for IV medications   Consultants:  None  Procedures:  None  Antimicrobials:  Anti-infectives (From admission, onward)    Start     Dose/Rate Route Frequency Ordered Stop   01/17/23 0000  azithromycin (ZITHROMAX) 500 mg in sodium chloride 0.9 % 250 mL IVPB        500 mg 250 mL/hr over 60 Minutes Intravenous Every 24 hours 01/16/23 0006 01/18/23 0221   01/16/23 2200  vancomycin (VANCOREADY) IVPB 750 mg/150 mL  Status:  Discontinued        750 mg 150 mL/hr over 60 Minutes Intravenous Every 24 hours 01/15/23 1858 01/16/23 0006   01/16/23 2000  ceFEPIme (MAXIPIME) 2 g in sodium chloride 0.9 % 100 mL IVPB  Status:  Discontinued        2 g 200 mL/hr over 30 Minutes Intravenous Every 24 hours 01/15/23 1858 01/16/23 0006   01/16/23 0030  Ampicillin-Sulbactam (UNASYN) 3 g in sodium chloride 0.9 % 100 mL IVPB        3 g 200 mL/hr over 30 Minutes Intravenous 2 times daily 01/16/23 0025     01/16/23 0015  azithromycin (ZITHROMAX) 500 mg in sodium chloride 0.9 % 250 mL IVPB        500 mg 250 mL/hr over 60 Minutes Intravenous  Once 01/16/23 0012 01/16/23 0249   01/15/23 1900  vancomycin (VANCOREADY) IVPB 1750 mg/350 mL        1,750 mg 175 mL/hr over 120 Minutes Intravenous  Once 01/15/23 1850 01/15/23 2122   01/15/23 1845  ceFEPIme (MAXIPIME) 2 g in sodium chloride 0.9 % 100 mL IVPB        2 g 200 mL/hr over 30 Minutes Intravenous  Once 01/15/23 1843 01/15/23 1902   01/15/23 1845  metroNIDAZOLE (FLAGYL) IVPB 500 mg        500 mg 100 mL/hr over 60 Minutes Intravenous  Once 01/15/23 1843 01/15/23 2010   01/15/23 1845  vancomycin (VANCOCIN) IVPB 1000 mg/200 mL premix  Status:  Discontinued        1,000 mg 200 mL/hr over 60 Minutes Intravenous  Once 01/15/23 1843 01/15/23 1850      Subjective: Patient seen and evaluated today with no new acute complaints or concerns. No acute concerns or events noted overnight. Per son, pt  is able to ambulate and stays tired/sleepy chronically. He is eating and drinking adequately.  Objective: Vitals:   01/18/23 0058 01/18/23 0400 01/18/23 0505 01/18/23 0740  BP: (!) 140/93  (!) 144/86 (!) 147/76  Pulse: 82  83 75  Resp: 19   18  Temp: 98.1 F (36.7 C)  (!) 97.1 F (36.2 C) 97.8 F (36.6 C)  TempSrc: Oral  Axillary Oral  SpO2: 91% 94% 97% 99%  Weight:   77.1 kg   Height:        Intake/Output Summary (Last 24 hours) at 01/18/2023 0923 Last data filed at 01/18/2023 0106 Gross per 24 hour  Intake 480 ml  Output 600 ml  Net -120 ml   Filed Weights   01/16/23 0304 01/17/23 0600 01/18/23 0505  Weight: 74.7 kg 74.7 kg 77.1 kg    Examination:  General exam: Appears sleepy Respiratory system: Clear to auscultation. Respiratory effort normal. On CPAP Cardiovascular system: S1 & S2 heard, RRR.  Gastrointestinal system: Abdomen is soft Central nervous system: somnolent Extremities: No edema Skin: No significant lesions noted Psychiatry: Flat affect.    Data Reviewed: I have personally reviewed following labs and imaging studies  CBC: Recent Labs  Lab 01/15/23 1806 01/16/23 0357 01/17/23 0052 01/18/23 0039  WBC 9.2 11.0* 13.3* 10.5  NEUTROABS 8.2*  --   --   --   HGB 9.4* 8.2* 7.9* 8.0*  HCT 27.9* 25.9* 25.3* 25.8*  MCV 79.3* 81.7 84.1 83.5  PLT 353 272 274 266   Basic Metabolic Panel: Recent Labs  Lab 01/15/23 1806 01/16/23 0115 01/16/23 0357 01/17/23 0052 01/18/23 0039  NA 137  --  141 138 135  K 2.4* 2.8* 3.9 3.1* 3.1*  CL 96*  --  106 103 100  CO2 20*  --  22 23 26   GLUCOSE 190*  --  155* 107* 116*  BUN 27*  --  22 26* 28*  CREATININE 2.59*  --  2.41* 2.30* 2.54*  CALCIUM 9.0  --  8.2* 8.1* 7.8*  MG 1.8  --   --   --  1.5*  PHOS  --   --   --   --  2.7   GFR: Estimated Creatinine Clearance: 25.1 mL/min (A) (by C-G formula based on SCr of 2.54 mg/dL (H)). Liver Function Tests: Recent Labs  Lab 01/15/23 1806 01/16/23 0357  01/17/23 0052 01/18/23 0039  AST 35 28 26  --   ALT 14 11 13   --   ALKPHOS 182* 146* 144*  --   BILITOT 1.7* 1.2 0.9  --   PROT 7.3 5.8* 5.3*  --   ALBUMIN 3.4* 2.6* 2.3* 2.3*   Recent Labs  Lab 01/15/23 1948  LIPASE 28   No results for input(s): "AMMONIA" in the last 168 hours. Coagulation Profile: Recent Labs  Lab 01/15/23 1806  INR 1.8*   Cardiac Enzymes: No results for input(s): "CKTOTAL", "CKMB", "CKMBINDEX", "TROPONINI" in the last 168 hours. BNP (last 3 results) No results for input(s): "PROBNP" in the last 8760 hours. HbA1C: Recent Labs    01/16/23 0115  HGBA1C 6.1*   CBG: Recent Labs  Lab 01/17/23 0623 01/17/23 1234 01/17/23 1609 01/17/23 2142 01/18/23 0624  GLUCAP 98 94 116* 106* 119*   Lipid Profile: No results for input(s): "CHOL", "HDL", "LDLCALC", "TRIG", "CHOLHDL", "LDLDIRECT" in the last 72 hours. Thyroid Function Tests: No results for input(s): "TSH", "T4TOTAL", "FREET4", "T3FREE", "THYROIDAB" in the last 72 hours. Anemia Panel: Recent Labs    01/16/23 0115  VITAMINB12 329  FOLATE 11.7  FERRITIN 134  TIBC 238*  IRON 19*  RETICCTPCT 1.6   Sepsis Labs: Recent Labs  Lab 01/15/23 2240 01/16/23 0004 01/16/23 0250 01/16/23 0357 01/17/23 0052  PROCALCITON  --   --   --   --  0.19  LATICACIDVEN 3.6* 2.2* 2.5* 2.2*  --     Recent Results (from the past 240 hour(s))  Culture, blood (Routine x 2)     Status: None (Preliminary result)   Collection Time: 01/15/23  6:01 PM   Specimen: BLOOD  Result Value Ref Range Status   Specimen Description BLOOD RIGHT ANTECUBITAL  Final   Special Requests   Final    BOTTLES DRAWN AEROBIC AND ANAEROBIC Blood Culture results may not be optimal due to an inadequate volume of blood received in culture bottles  Culture   Final    NO GROWTH 3 DAYS Performed at [facility], [address], [place], [place] [address]    Report Status PENDING  Incomplete  Culture, blood (Routine x 2)     Status: None  (Preliminary result)   Collection Time: 01/15/23  6:05 PM   Specimen: BLOOD  Result Value Ref Range Status   Specimen Description BLOOD BLOOD RIGHT FOREARM  Final   Special Requests   Final    BOTTLES DRAWN AEROBIC AND ANAEROBIC Blood Culture adequate volume   Culture   Final    NO GROWTH 3 DAYS Performed at [facility], [address], [place], [place] [address]    Report Status PENDING  Incomplete  Respiratory (~20 pathogens) panel by PCR     Status: None   Collection Time: 01/16/23  1:22 AM   Specimen: Nasopharyngeal Swab; Respiratory  Result Value Ref Range Status   Adenovirus NOT DETECTED NOT DETECTED Final   Coronavirus 229E NOT DETECTED NOT DETECTED Final    Comment: (NOTE) The Coronavirus on the Respiratory Panel, DOES NOT test for the novel  Coronavirus (2019 nCoV)    Coronavirus HKU1 NOT DETECTED NOT DETECTED Final   Coronavirus NL63 NOT DETECTED NOT DETECTED Final   Coronavirus OC43 NOT DETECTED NOT DETECTED Final   Metapneumovirus NOT DETECTED NOT DETECTED Final   Rhinovirus / Enterovirus NOT DETECTED NOT DETECTED Final   Influenza A NOT DETECTED NOT DETECTED Final   Influenza B NOT DETECTED NOT DETECTED Final   Parainfluenza Virus 1 NOT DETECTED NOT DETECTED Final   Parainfluenza Virus 2 NOT DETECTED NOT DETECTED Final   Parainfluenza Virus 3 NOT DETECTED NOT DETECTED Final   Parainfluenza Virus 4 NOT DETECTED NOT DETECTED Final   Respiratory Syncytial Virus NOT DETECTED NOT DETECTED Final   Bordetella pertussis NOT DETECTED NOT DETECTED Final   Bordetella Parapertussis NOT DETECTED NOT DETECTED Final   Chlamydophila pneumoniae NOT DETECTED NOT DETECTED Final   Mycoplasma pneumoniae NOT DETECTED NOT DETECTED Final    Comment: Performed at [facility], [address], [place], [place] [address]  SARS Coronavirus 2 by RT PCR (hospital order, performed in [facility] hospital lab) *cepheid single result test*     Status: None   Collection Time: 01/16/23  1:22  AM  Result Value Ref Range Status   SARS Coronavirus 2 by RT PCR NEGATIVE NEGATIVE Final    Comment: Performed at [facility], [address], [place], [place] [address]         Radiology Studies: DG CHEST PORT 1 VIEW  Result Date: 01/18/2023 CLINICAL DATA:  Pulmonary edema. EXAM: PORTABLE CHEST 1 VIEW COMPARISON:  CT chest and chest x-ray dated January 15, 2023. FINDINGS: Unchanged cardiomegaly. Increased interstitial thickening. Enlarging small bilateral pleural effusions. New patchy opacities in the right upper lobe and both lower lobes. No pneumothorax. No acute osseous abnormality. IMPRESSION: 1. Worsening pulmonary edema with enlarging small bilateral pleural effusions. 2. New patchy opacities in the right upper lobe and both lower lobes could reflect asymmetric edema and/or multifocal pneumonia. Electronically Signed   By: Obie Dredge M.D.   On: 01/18/2023 08:27   ECHOCARDIOGRAM COMPLETE  Result Date: 01/17/2023    ECHOCARDIOGRAM REPORT   Patient Name:   STONEWALL DOSS Date of Exam: 01/17/2023 Medical Rec #:  [id number]           Height:       70.0 in Accession #:    [id number]  Weight:       164.7 lb Date of Birth:  1946-04-27           BSA:          1.922 m Patient Age:    77 years            BP:           145/88 mmHg Patient Gender: M                   HR:           100 bpm. Exam Location:  Inpatient Procedure: 2D Echo, Cardiac Doppler and Color Doppler Indications:    Dyspnea R06.00  History:        Patient has prior history of Echocardiogram examinations, most                 recent 09/23/2022. CKD 4, Carotid Disease and PAD; Risk                 Factors:Dyslipidemia, Hypertension and Former Smoker.  Sonographer:    Dondra Prader RVT RCS Referring Phys: [id number] RAJIV C PATEL IMPRESSIONS  1. Left ventricular ejection fraction, by estimation, is 50 to 55%. The left ventricle has low normal function. The left ventricle demonstrates regional wall motion abnormalities (see  scoring diagram/findings for description). There is mild concentric left ventricular hypertrophy. Left ventricular diastolic function could not be evaluated. There is mild hypokinesis of the left ventricular, apical anterior wall.  2. Right ventricular systolic function was not well visualized. The right ventricular size is normal. There is normal pulmonary artery systolic pressure. The estimated right ventricular systolic pressure is 25.6 mmHg.  3. Left atrial size was moderately dilated.  4. Right atrial size was mildly dilated.  5. Moderate pericardial effusion. The pericardial effusion is circumferential. There is no evidence of cardiac tamponade.  6. The mitral valve is normal in structure. No evidence of mitral valve regurgitation.  7. The aortic valve is tricuspid. There is mild calcification of the aortic valve. Aortic valve regurgitation is not visualized. Aortic valve sclerosis/calcification is present, without any evidence of aortic stenosis.  8. The inferior vena cava is dilated in size with >50% respiratory variability, suggesting right atrial pressure of 8 mmHg. Comparison(s): A prior study was performed on 09/11/2022. Prior images reviewed side by side. The pericardial effusion is much smaller. There appears to be a new area of subtle anteroapical hypokinesis (evaluation is somewhat hindered by superimposed paradoxical septal motion and image quality). FINDINGS  Left Ventricle: Left ventricular ejection fraction, by estimation, is 50 to 55%. The left ventricle has low normal function. The left ventricle demonstrates regional wall motion abnormalities. Mild hypokinesis of the left ventricular, apical anterior wall. The left ventricular internal cavity size was normal in size. There is mild concentric left ventricular hypertrophy. Abnormal (paradoxical) septal motion consistent with post-operative status. Left ventricular diastolic function could not be evaluated due to atrial fibrillation. Left  ventricular diastolic function could not be evaluated.  LV Wall Scoring: The apical septal segment, apical anterior segment, apical inferior segment, and apex are hypokinetic. The anterior wall, entire lateral wall, anterior septum, inferior wall, mid inferoseptal segment, and basal inferoseptal segment are normal. Right Ventricle: The right ventricular size is normal. Right vetricular wall thickness was not well visualized. Right ventricular systolic function was not well visualized. There is normal pulmonary artery systolic pressure. The tricuspid regurgitant velocity is 2.10 m/s, and with an assumed right atrial pressure of  8 mmHg, the estimated right ventricular systolic pressure is 25.6 mmHg. Left Atrium: Left atrial size was moderately dilated. Right Atrium: Right atrial size was mildly dilated. Pericardium: A moderately sized pericardial effusion is present. The pericardial effusion is circumferential. There is no evidence of cardiac tamponade. Mitral Valve: The mitral valve is normal in structure. No evidence of mitral valve regurgitation. Tricuspid Valve: The tricuspid valve is normal in structure. Tricuspid valve regurgitation is trivial. Aortic Valve: The aortic valve is tricuspid. There is mild calcification of the aortic valve. Aortic valve regurgitation is not visualized. Aortic valve sclerosis/calcification is present, without any evidence of aortic stenosis. Aortic valve mean gradient measures 2.0 mmHg. Aortic valve peak gradient measures 4.3 mmHg. Aortic valve area, by VTI measures 2.36 cm. Pulmonic Valve: The pulmonic valve was grossly normal. Pulmonic valve regurgitation is not visualized. Aorta: The aortic root and ascending aorta are structurally normal, with no evidence of dilitation. Venous: The inferior vena cava is dilated in size with greater than 50% respiratory variability, suggesting right atrial pressure of 8 mmHg. IAS/Shunts: No atrial level shunt detected by color flow Doppler.  LEFT  VENTRICLE PLAX 2D LVIDd:         4.70 cm LVIDs:         3.30 cm LV PW:         1.50 cm LV IVS:        1.10 cm LVOT diam:     1.90 cm LV SV:         45 LV SV Index:   23 LVOT Area:     2.84 cm  RIGHT VENTRICLE          IVC RV Basal diam:  2.85 cm  IVC diam: 2.40 cm TAPSE (M-mode): 2.1 cm LEFT ATRIUM             Index        RIGHT ATRIUM           Index LA diam:        4.40 cm 2.29 cm/m   RA Area:     20.10 cm LA Vol (A2C):   73.2 ml 38.09 ml/m  RA Volume:   60.60 ml  31.53 ml/m LA Vol (A4C):   61.3 ml 31.90 ml/m LA Biplane Vol: 67.5 ml 35.12 ml/m  AORTIC VALVE                    PULMONIC VALVE AV Area (Vmax):    2.36 cm     PV Vmax:       0.70 m/s AV Area (Vmean):   2.17 cm     PV Peak grad:  2.0 mmHg AV Area (VTI):     2.36 cm AV Vmax:           104.00 cm/s AV Vmean:          72.000 cm/s AV VTI:            0.189 m AV Peak Grad:      4.3 mmHg AV Mean Grad:      2.0 mmHg LVOT Vmax:         86.40 cm/s LVOT Vmean:        55.100 cm/s LVOT VTI:          0.157 m LVOT/AV VTI ratio: 0.83  AORTA Ao Root diam: 3.10 cm Ao Asc diam:  3.50 cm MITRAL VALVE  TRICUSPID VALVE MV Area (PHT): 3.53 cm     TR Peak grad:   17.6 mmHg MV Decel Time: 215 msec     TR Vmax:        210.00 cm/s MV E velocity: 126.00 cm/s                             SHUNTS                             Systemic VTI:  0.16 m                             Systemic Diam: 1.90 cm Mihai Croitoru MD Electronically signed by Thurmon Fair MD Signature Date/Time: 01/17/2023/1:08:46 PM    Final         Scheduled Meds:  allopurinol  50 mg Oral Daily   amLODipine  10 mg Oral QPM   apixaban  5 mg Oral BID   aspirin EC  81 mg Oral Daily   atorvastatin  40 mg Oral QPM   cholecalciferol  2,000 Units Oral Daily   cloNIDine  0.3 mg Oral TID   donepezil  5 mg Oral QHS   doxazosin  4 mg Oral BID   ferrous sulfate  325 mg Oral Q breakfast   furosemide  80 mg Oral BID   insulin aspart  0-5 Units Subcutaneous QHS   insulin aspart  0-9 Units  Subcutaneous TID WC   metoprolol tartrate  25 mg Oral BID   potassium chloride  10 mEq Oral BID   potassium chloride  40 mEq Oral BID   sertraline  50 mg Oral Daily   sodium chloride flush  3 mL Intravenous Q12H   Continuous Infusions:  ampicillin-sulbactam (UNASYN) IV 3 g (01/17/23 2247)   magnesium sulfate bolus IVPB       LOS: 2 days    Time spent: 35 minutes    Makeisha Jentsch Hoover Brunette, DO [facility]  If 7PM-7AM, please contact night-coverage [email or web address] 01/18/2023, 9:23 AM

## 2023-01-18 NOTE — TOC Initial Note (Addendum)
Transition of Care [facility]) - Initial/Assessment Note    Patient Details  Name: James [name] MRN: [id number] Date of Birth: 1946-03-13  Transition of Care [facility]) CM/SW Contact:    Leone Haven, RN Phone Number: 01/18/2023, 6:33 PM  Clinical Narrative:                 From home with wife and son, he has PCP and insurance on file, he states he has a cpap machine that he uses, but does not have any  HH services in place at this time. Per ambulatory sats he will need home oxygen.  NCM discussed agency for oxygen with patient, he states he has no preference for agency.    Per PT eval rec no PT f/u.   His wife will transport him home at [employer], she and his son are his support system.  [facility] [place].  Expected Discharge Plan: Home/Self Care Barriers to Discharge: Continued Medical Work up   Patient Goals and CMS Choice Patient states their goals for this hospitalization and ongoing recovery are:: return home   Choice offered to / list presented to : NA      Expected Discharge Plan and Services In-house Referral: NA Discharge Planning Services: CM Consult Post Acute Care Choice: NA Living arrangements for the past 2 months: Single Family Home                 DME Arranged: N/A DME Agency: NA       HH Arranged: NA          Prior Living Arrangements/Services Living arrangements for the past 2 months: Single Family Home Lives with:: Spouse Patient language and need for interpreter reviewed:: Yes Do you feel safe going back to the place where you live?: Yes        Care giver support system in place?: Yes (comment)   Criminal Activity/Legal Involvement Pertinent to Current Situation/Hospitalization: No - Comment as needed  Activities of Daily Living Home Assistive Devices/Equipment: None ADL Screening (condition at time of admission) Patient's cognitive ability adequate to safely complete daily activities?:  Yes Is the patient deaf or have difficulty hearing?: No Does the patient have difficulty seeing, even when wearing glasses/contacts?: No Does the patient have difficulty concentrating, remembering, or making decisions?: No Patient able to express need for assistance with ADLs?: Yes Does the patient have difficulty dressing or bathing?: No Independently performs ADLs?: No Communication: Independent Dressing (OT): Needs assistance Is this a change from baseline?: Change from baseline, expected to last <3days Grooming: Independent Feeding: Independent Bathing: Needs assistance Is this a change from baseline?: Change from baseline, expected to last <3 days Toileting: Needs assistance Is this a change from baseline?: Change from baseline, expected to last <3 days In/Out Bed: Needs assistance Is this a change from baseline?: Change from baseline, expected to last <3 days Walks in Home: Independent Does the patient have difficulty walking or climbing stairs?: No Weakness of Legs: Both Weakness of Arms/Hands: Both  Permission Sought/Granted Permission sought to share information with : Case Manager                Emotional Assessment Appearance:: Appears stated age Attitude/Demeanor/Rapport: Engaged Affect (typically observed): Appropriate Orientation: : Oriented to Self, Oriented to Place, Oriented to  Time, Oriented to Situation Alcohol / Substance Use: Not Applicable Psych Involvement: No (comment)  Admission diagnosis:  Lactic acidosis [E87.20] Generalized abdominal pain [R10.84] Community acquired  pneumonia [J18.9] Atrial fibrillation with rapid ventricular response (HCC) [I48.91] Nausea and vomiting, unspecified vomiting type [R11.2] Patient Active Problem List   Diagnosis Date Noted   Enteritis 01/16/2023   CAP (community acquired pneumonia) 01/16/2023   Lactic acidosis 01/16/2023   Community acquired pneumonia 01/16/2023   Persistent atrial fibrillation (HCC)  10/22/2022   Hypercoagulable state due to persistent atrial fibrillation (HCC) 10/22/2022   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   Chest pain 08/30/2022   Pulmonary nodules 08/30/2022   CKD (chronic kidney disease) stage 4, GFR 15-29 ml/min (HCC) 08/30/2022   Hypokalemia 08/30/2022   Bilateral carotid artery stenosis 11/17/2021   Fatigue 01/22/2021   Pericardial effusion 10/13/2020   Chronic kidney disease (CKD), stage III (moderate) (HCC) 10/21/2017   OSA on CPAP 03/09/2016   Hypersomnia 01/07/2016   Exertional dyspnea 01/07/2016   Back pain 08/01/2015   Gastroesophageal reflux disease with esophagitis 07/17/2015   Accelerated secondary hypertension 07/17/2015   S/P arterial stent 04/29/2015   Renal artery stenosis (HCC) 12/26/2012   Hypertension 10/11/2012   Hyperlipidemia LDL goal <100 10/11/2012   Gout 10/11/2012   Occlusion and stenosis of carotid artery without mention of cerebral infarction 10/12/2011   PCP:  [name], [name], MD Pharmacy:   [phone] [place] [place] [facility] [address] [place] [address] Phone: [phone] Fax: [phone]  [facility] - [place], [place] - [address] [place] [address] Phone: [phone] Fax: [phone]     Social Determinants of Health (SDOH) Social History: SDOH Screenings   Food Insecurity: No Food Insecurity (01/16/2023)  Housing: Low Risk  (01/16/2023)  Transportation Needs: No Transportation Needs (01/16/2023)  Utilities: Not At Risk (01/16/2023)  Depression (PHQ2-9): Low Risk  (02/13/2022)  Financial Resource Strain: Low Risk  (09/29/2022)  Physical Activity: Inactive (09/21/2019)  Social Connections: Socially Integrated (09/21/2019)  Stress: No Stress Concern Present (09/21/2019)  Tobacco Use: Medium Risk (01/15/2023)   SDOH Interventions:     Readmission Risk Interventions     No data to display

## 2023-01-18 NOTE — Consult Note (Signed)
   [facility] Inpatient Consult   01/18/2023  James [name] Jul 24, 1946 [id number]  [facility] [[facility]]  Accountable Care Organization [ACO] Patient: Medicare ACO REACH  Primary Care Provider:  [name], [name], MD, [facility] which is listed to provide the transition of care follow up   Patient screened for hospitalization with noted high risk score for unplanned readmission risk  and to assess for potential [facility]  [[facility]] Care Management service needs for post hospital transition for care coordination.  Review of patient's electronic medical record reveals patient is currently from home with  spouse and reviewed for post hospital transitional needs. Resting on rounds will follow with inpatient team.  Plan:  Continue to follow progress and disposition to assess for post hospital community care coordination/management needs.  Referral request for community care coordination: continue to follow.  Of note, [facility] Care Management/Population Health does not replace or interfere with any arrangements made by the Inpatient Transition of Care team.  For questions contact:   Charlesetta Shanks, RN BSN CCM [facility]  [phone] business mobile phone Toll free office [phone]  *Concierge Line  [phone] Fax number: [phone] [email or web address]

## 2023-01-18 NOTE — Progress Notes (Signed)
Secure chat sent to oncall MD Imogene Burn regarding patients son James [name] reguesting a phone call regarding his father's care.  The patient is a poor  historian and is not able to remember who spoke with him.  [phone]  Attempted to add MD Wess Botts to secure chat but he was not available to add

## 2023-01-19 DIAGNOSIS — K529 Noninfective gastroenteritis and colitis, unspecified: Secondary | ICD-10-CM | POA: Diagnosis not present

## 2023-01-19 LAB — RENAL FUNCTION PANEL
Albumin: 2.3 g/dL — ABNORMAL LOW (ref 3.5–5.0)
Anion gap: 10 (ref 5–15)
BUN: 28 mg/dL — ABNORMAL HIGH (ref 8–23)
CO2: 25 mmol/L (ref 22–32)
Calcium: 8 mg/dL — ABNORMAL LOW (ref 8.9–10.3)
Chloride: 100 mmol/L (ref 98–111)
Creatinine, Ser: 2.47 mg/dL — ABNORMAL HIGH (ref 0.61–1.24)
GFR, Estimated: 26 mL/min — ABNORMAL LOW (ref >60)
Glucose, Bld: 112 mg/dL — ABNORMAL HIGH (ref 70–99)
Phosphorus: 2.7 mg/dL (ref 2.5–4.6)
Potassium: 3.6 mmol/L (ref 3.5–5.1)
Sodium: 135 mmol/L (ref 135–145)

## 2023-01-19 LAB — CBC
HCT: 27.2 % — ABNORMAL LOW (ref 39.0–52.0)
Hemoglobin: 8.6 g/dL — ABNORMAL LOW (ref 13.0–17.0)
MCH: 25.7 pg — ABNORMAL LOW (ref 26.0–34.0)
MCHC: 31.6 g/dL (ref 30.0–36.0)
MCV: 81.4 fL (ref 80.0–100.0)
Platelets: 293 10*3/uL (ref 150–400)
RBC: 3.34 MIL/uL — ABNORMAL LOW (ref 4.22–5.81)
RDW: 15.9 % — ABNORMAL HIGH (ref 11.5–15.5)
WBC: 9.7 10*3/uL (ref 4.0–10.5)
nRBC: 0 % (ref 0.0–0.2)

## 2023-01-19 LAB — GLUCOSE, CAPILLARY: Glucose-Capillary: 105 mg/dL — ABNORMAL HIGH (ref 70–99)

## 2023-01-19 LAB — CULTURE, BLOOD (ROUTINE X 2): Culture: NO GROWTH

## 2023-01-19 LAB — MAGNESIUM: Magnesium: 2 mg/dL (ref 1.7–2.4)

## 2023-01-19 MED ORDER — AMOXICILLIN-POT CLAVULANATE 500-125 MG PO TABS: 1.0000 | ORAL_TABLET | Freq: Two times a day (BID) | ORAL | 0 refills | Status: AC

## 2023-01-19 NOTE — Consult Note (Signed)
   [facility] CM Inpatient Consult   01/19/2023  Edder Bellanca Vessey 1945/12/15 [id number]  Follow up:  Met with patient and son.  Patient states he is ready to go home.  He is new to home oxygen to be at [facility]. Oxygen noted at bedside. Explained reason for rounding rounding visit and to anticipate a post hospital follow up call from PCP office.  Patient has a PCP appointment  noted 01/25/23 as well. Was given an appointment reminder card to son.  No additional needs.   Charlesetta Shanks, RN BSN CCM [facility]  [phone] business mobile phone Toll free office [phone]  *Concierge Line  [phone] Fax number: [phone] [email or web address]

## 2023-01-19 NOTE — Care Management Important Message (Signed)
Important Message  Patient Details  Name: James [name] MRN: [id number] Date of Birth: 12-15-1945   Medicare Important Message Given:  Yes     Renie Ora 01/19/2023, 11:38 AM

## 2023-01-19 NOTE — TOC Transition Note (Signed)
Transition of Care [facility]) - CM/SW Discharge Note   Patient Details  Name: James [name] MRN: [id number] Date of Birth: 03-13-1946  Transition of Care [facility]) CM/SW Contact:  Leone Haven, RN Phone Number: 01/19/2023, 9:58 AM   Clinical Narrative:    Patient is for dc today, he has no preference for DME agency, NCM made referral to [facility] with [employer] for home oxygen, he will bring the oxygen tank to patient room and set up the concentrator at his home.     Final next level of care: Home/Self Care Barriers to Discharge: No Barriers Identified   Patient Goals and CMS Choice CMS Medicare.gov Compare Post Acute Care list provided to:: Patient Choice offered to / list presented to : Patient  Discharge Placement                         Discharge Plan and Services Additional resources added to the After Visit Summary for   In-house Referral: NA Discharge Planning Services: CM Consult Post Acute Care Choice: NA          DME Arranged: Oxygen DME Agency: [employer] Date DME Agency Contacted: 01/19/23 Time DME Agency Contacted: [phone] Representative spoke with at DME Agency: Vaughan Basta HH Arranged: NA          Social Determinants of Health (SDOH) Interventions SDOH Screenings   Food Insecurity: No Food Insecurity (01/16/2023)  Housing: Low Risk  (01/16/2023)  Transportation Needs: No Transportation Needs (01/16/2023)  Utilities: Not At Risk (01/16/2023)  Depression (PHQ2-9): Low Risk  (02/13/2022)  Financial Resource Strain: Low Risk  (09/29/2022)  Physical Activity: Inactive (09/21/2019)  Social Connections: Socially Integrated (09/21/2019)  Stress: No Stress Concern Present (09/21/2019)  Tobacco Use: Medium Risk (01/15/2023)     Readmission Risk Interventions     No data to display

## 2023-01-19 NOTE — Discharge Summary (Signed)
Physician Discharge Summary  James [name] MVH:[id number] DOB: 09/07/1945 DOA: 01/15/2023  PCP: [name], [name], MD  Admit date: 01/15/2023  Discharge date: 01/19/2023  Admitted From:Home  Disposition:  Home  Recommendations for Outpatient Follow-up:  Follow up with PCP on 01/25/2023 at 3:30 PM as scheduled Referral to pulmonology for pulmonary nodule evaluation Continue Augmentin as prescribed for 4 more days to complete course of treatment for aspiration pneumonia, total 7 days Continue home medications as prior  Home Health: None  Equipment/Devices: Home oxygen 2 L  Discharge Condition:Stable  CODE STATUS: Full  Diet recommendation: Heart Healthy  Brief/Interim Summary: James [name] is a 77 y.o. male with medical history significant of dementia.  Therefore patient is unable to provide detailed account of his recent illness.  Fortunately we have the patient's son at the bedside as well as his wife who are giving history.  Patient seems to have been in his usual state of health till yesterday morning when patient threw up after breakfast at approximately 11 AM.  Thereafter patient had poor appetite all day yesterday and seemed a bit lethargic.  Since this morning patient has had at least 4 episodes of vomiting which are small nonbloody consisting of ingested food versus bile.  Associated with multiple loose bowel movements.  The color of the bowel movements is not known, the exact number of bowel movements is not known but is estimated at approximately 4 today.  Patient did not complain of any abdominal pain.  At approximately 3 PM, patient was just vomiting and retching when he had new onset of left-sided chest pain anterior.  That seem to be severe.  It cannot be ascertained if there was any radiation or what the nature of the pain was.  There was no associated shortness of breath cough no fever no palpitation no presyncope no loss of consciousness no trauma.  Patient was  initially taken to [facility], ER with above complaints. Given that the CAT scan at the outside ER was not working, patient is brought to [facility] ER.  Patient has been diagnosed with sepsis and started on IV antibiotics.  Medical evaluation is sought.   He was admitted with acute hypoxemic respiratory failure and likely in the setting of aspiration pneumonia and started on Unasyn and azithromycin.  He is now in stable condition for discharge with transition to oral antibiotics and has been seen by physical therapy with no home needs noted.  He does have pulmonary nodules noted on imaging which will need outpatient pulmonology follow-up.  He may resume the rest of his home medications as previously prescribed and will need to remain on some home oxygen as prescribed as well.  No other acute events or concerns noted.  Discharge Diagnoses:  Principal Problem:   Enteritis Active Problems:   Chest pain   Pulmonary nodules   CKD (chronic kidney disease) stage 4, GFR 15-29 ml/min (HCC)   Hypokalemia   Persistent atrial fibrillation (HCC)   CAP (community acquired pneumonia)   Lactic acidosis   Community acquired pneumonia  Principal discharge diagnosis: Acute hypoxemic respiratory failure likely secondary to aspiration pneumonia in the setting of enteritis-improved.  Discharge Instructions  Discharge Instructions     Diet - low sodium heart healthy   Complete by: As directed    Increase activity slowly   Complete by: As directed       Allergies as of 01/19/2023       Reactions   Plavix [clopidogrel Bisulfate] Other (See  Comments)   GI Bleed   Uloric [febuxostat] Other (See Comments)   Unknown reaction        Medication List     TAKE these medications    allopurinol 100 MG tablet Commonly known as: ZYLOPRIM Take 1 tablet (100 mg total) by mouth 2 (two) times daily.   amLODipine-atorvastatin 10-40 MG tablet Commonly known as: CADUET Take 1 tablet by mouth every evening.    amoxicillin-clavulanate 500-125 MG tablet Commonly known as: Augmentin Take 1 tablet by mouth 2 (two) times daily for 4 days.   apixaban 5 MG Tabs tablet Commonly known as: ELIQUIS Take 1 tablet (5 mg total) by mouth 2 (two) times daily.   aspirin EC 81 MG tablet Take 1 tablet (81 mg total) by mouth daily. Swallow whole.   cloNIDine 0.3 MG tablet Commonly known as: CATAPRES Take 0.3 mg by mouth 3 (three) times daily.   donepezil 5 MG tablet Commonly known as: ARICEPT Take 1 tablet (5 mg total) by mouth at bedtime.   doxazosin 2 MG tablet Commonly known as: CARDURA Take 4 mg by mouth 2 (two) times daily.   ferrous sulfate 325 (65 FE) MG tablet Take 325 mg by mouth in the morning and at bedtime.   furosemide 80 MG tablet Commonly known as: LASIX Taking 120 mg by mouth in the am and 80 mg in the evening   metoprolol tartrate 25 MG tablet Commonly known as: LOPRESSOR Take 1 tablet (25 mg total) by mouth 2 (two) times daily.   potassium chloride 10 MEQ CR capsule Commonly known as: MICRO-K Take 10 mEq by mouth daily.   sertraline 50 MG tablet Commonly known as: ZOLOFT Take 50 mg by mouth daily.   Vitamin D3 50 MCG (2000 UT) Tabs Take 2,000 Units by mouth daily.               Durable Medical Equipment  (From admission, onward)           Start     Ordered   01/19/23 0840  For home use only DME oxygen  Once       Question Answer Comment  Length of Need 6 Months   Mode or (Route) Nasal cannula   Liters per Minute 2   Frequency Continuous (stationary and portable oxygen unit needed)   Oxygen conserving device Yes   Oxygen delivery system Gas      01/19/23 0839            Follow-up Information     [name], [name], MD. Go on 01/25/2023.   Specialties: Family Medicine, Cardiology Why: @3 :30pm Contact information: [address] [place] [address] [phone]                Allergies  Allergen Reactions   Plavix [Clopidogrel  Bisulfate] Other (See Comments)    GI Bleed   Uloric [Febuxostat] Other (See Comments)    Unknown reaction    Consultations: None   Procedures/Studies: DG CHEST PORT 1 VIEW  Result Date: 01/18/2023 CLINICAL DATA:  Pulmonary edema. EXAM: PORTABLE CHEST 1 VIEW COMPARISON:  CT chest and chest x-ray dated January 15, 2023. FINDINGS: Unchanged cardiomegaly. Increased interstitial thickening. Enlarging small bilateral pleural effusions. New patchy opacities in the right upper lobe and both lower lobes. No pneumothorax. No acute osseous abnormality. IMPRESSION: 1. Worsening pulmonary edema with enlarging small bilateral pleural effusions. 2. New patchy opacities in the right upper lobe and both lower lobes could reflect asymmetric edema and/or  multifocal pneumonia. Electronically Signed   By: Obie Dredge M.D.   On: 01/18/2023 08:27   ECHOCARDIOGRAM COMPLETE  Result Date: 01/17/2023    ECHOCARDIOGRAM REPORT   Patient Name:   DARSHAN SOLANKI Date of Exam: 01/17/2023 Medical Rec #:  [id number]           Height:       70.0 in Accession #:    [id number]          Weight:       164.7 lb Date of Birth:  Apr 10, 1946           BSA:          1.922 m Patient Age:    77 years            BP:           145/88 mmHg Patient Gender: M                   HR:           100 bpm. Exam Location:  Inpatient Procedure: 2D Echo, Cardiac Doppler and Color Doppler Indications:    Dyspnea R06.00  History:        Patient has prior history of Echocardiogram examinations, most                 recent 09/23/2022. CKD 4, Carotid Disease and PAD; Risk                 Factors:Dyslipidemia, Hypertension and Former Smoker.  Sonographer:    Dondra Prader RVT RCS Referring Phys: [id number] RAJIV C PATEL IMPRESSIONS  1. Left ventricular ejection fraction, by estimation, is 50 to 55%. The left ventricle has low normal function. The left ventricle demonstrates regional wall motion abnormalities (see scoring diagram/findings for description). There is  mild concentric left ventricular hypertrophy. Left ventricular diastolic function could not be evaluated. There is mild hypokinesis of the left ventricular, apical anterior wall.  2. Right ventricular systolic function was not well visualized. The right ventricular size is normal. There is normal pulmonary artery systolic pressure. The estimated right ventricular systolic pressure is 25.6 mmHg.  3. Left atrial size was moderately dilated.  4. Right atrial size was mildly dilated.  5. Moderate pericardial effusion. The pericardial effusion is circumferential. There is no evidence of cardiac tamponade.  6. The mitral valve is normal in structure. No evidence of mitral valve regurgitation.  7. The aortic valve is tricuspid. There is mild calcification of the aortic valve. Aortic valve regurgitation is not visualized. Aortic valve sclerosis/calcification is present, without any evidence of aortic stenosis.  8. The inferior vena cava is dilated in size with >50% respiratory variability, suggesting right atrial pressure of 8 mmHg. Comparison(s): A prior study was performed on 09/11/2022. Prior images reviewed side by side. The pericardial effusion is much smaller. There appears to be a new area of subtle anteroapical hypokinesis (evaluation is somewhat hindered by superimposed paradoxical septal motion and image quality). FINDINGS  Left Ventricle: Left ventricular ejection fraction, by estimation, is 50 to 55%. The left ventricle has low normal function. The left ventricle demonstrates regional wall motion abnormalities. Mild hypokinesis of the left ventricular, apical anterior wall. The left ventricular internal cavity size was normal in size. There is mild concentric left ventricular hypertrophy. Abnormal (paradoxical) septal motion consistent with post-operative status. Left ventricular diastolic function could not be evaluated due to atrial fibrillation. Left ventricular diastolic function could not be evaluated.  LV  Wall Scoring: The apical septal segment, apical anterior segment, apical inferior segment, and apex are hypokinetic. The anterior wall, entire lateral wall, anterior septum, inferior wall, mid inferoseptal segment, and basal inferoseptal segment are normal. Right Ventricle: The right ventricular size is normal. Right vetricular wall thickness was not well visualized. Right ventricular systolic function was not well visualized. There is normal pulmonary artery systolic pressure. The tricuspid regurgitant velocity is 2.10 m/s, and with an assumed right atrial pressure of 8 mmHg, the estimated right ventricular systolic pressure is 25.6 mmHg. Left Atrium: Left atrial size was moderately dilated. Right Atrium: Right atrial size was mildly dilated. Pericardium: A moderately sized pericardial effusion is present. The pericardial effusion is circumferential. There is no evidence of cardiac tamponade. Mitral Valve: The mitral valve is normal in structure. No evidence of mitral valve regurgitation. Tricuspid Valve: The tricuspid valve is normal in structure. Tricuspid valve regurgitation is trivial. Aortic Valve: The aortic valve is tricuspid. There is mild calcification of the aortic valve. Aortic valve regurgitation is not visualized. Aortic valve sclerosis/calcification is present, without any evidence of aortic stenosis. Aortic valve mean gradient measures 2.0 mmHg. Aortic valve peak gradient measures 4.3 mmHg. Aortic valve area, by VTI measures 2.36 cm. Pulmonic Valve: The pulmonic valve was grossly normal. Pulmonic valve regurgitation is not visualized. Aorta: The aortic root and ascending aorta are structurally normal, with no evidence of dilitation. Venous: The inferior vena cava is dilated in size with greater than 50% respiratory variability, suggesting right atrial pressure of 8 mmHg. IAS/Shunts: No atrial level shunt detected by color flow Doppler.  LEFT VENTRICLE PLAX 2D LVIDd:         4.70 cm LVIDs:          3.30 cm LV PW:         1.50 cm LV IVS:        1.10 cm LVOT diam:     1.90 cm LV SV:         45 LV SV Index:   23 LVOT Area:     2.84 cm  RIGHT VENTRICLE          IVC RV Basal diam:  2.85 cm  IVC diam: 2.40 cm TAPSE (M-mode): 2.1 cm LEFT ATRIUM             Index        RIGHT ATRIUM           Index LA diam:        4.40 cm 2.29 cm/m   RA Area:     20.10 cm LA Vol (A2C):   73.2 ml 38.09 ml/m  RA Volume:   60.60 ml  31.53 ml/m LA Vol (A4C):   61.3 ml 31.90 ml/m LA Biplane Vol: 67.5 ml 35.12 ml/m  AORTIC VALVE                    PULMONIC VALVE AV Area (Vmax):    2.36 cm     PV Vmax:       0.70 m/s AV Area (Vmean):   2.17 cm     PV Peak grad:  2.0 mmHg AV Area (VTI):     2.36 cm AV Vmax:           104.00 cm/s AV Vmean:          72.000 cm/s AV VTI:            0.189 m AV Peak Grad:      4.3  mmHg AV Mean Grad:      2.0 mmHg LVOT Vmax:         86.40 cm/s LVOT Vmean:        55.100 cm/s LVOT VTI:          0.157 m LVOT/AV VTI ratio: 0.83  AORTA Ao Root diam: 3.10 cm Ao Asc diam:  3.50 cm MITRAL VALVE                TRICUSPID VALVE MV Area (PHT): 3.53 cm     TR Peak grad:   17.6 mmHg MV Decel Time: 215 msec     TR Vmax:        210.00 cm/s MV E velocity: 126.00 cm/s                             SHUNTS                             Systemic VTI:  0.16 m                             Systemic Diam: 1.90 cm Rachelle Hora Croitoru MD Electronically signed by Thurmon Fair MD Signature Date/Time: 01/17/2023/1:08:46 PM    Final    CT CHEST ABDOMEN PELVIS WO CONTRAST  Result Date: 01/15/2023 CLINICAL DATA:  sepsis, lactic acidosis. abdominal pain EXAM: CT CHEST, ABDOMEN AND PELVIS WITHOUT CONTRAST TECHNIQUE: Multidetector CT imaging of the chest, abdomen and pelvis was performed following the standard protocol without IV contrast. RADIATION DOSE REDUCTION: This exam was performed according to the departmental dose-optimization program which includes automated exposure control, adjustment of the mA and/or kV according to patient size  and/or use of iterative reconstruction technique. COMPARISON:  CT chest abdomen pelvis 08/30/2022 FINDINGS: CT CHEST FINDINGS Cardiovascular: Normal heart size. Interval decrease in size of a trace pericardial effusion. The thoracic aorta is normal in caliber. Severe atherosclerotic plaque of the thoracic aorta. Four-vessel coronary artery calcifications. The main pulmonary artery is normal in caliber. Mediastinum/Nodes: No enlarged mediastinal, hilar, or axillary lymph nodes. Thyroid gland, trachea, and esophagus demonstrate no significant findings. Lungs/Pleura: Hyperinflation of the lungs with biapical trace paraseptal emphysematous changes. Biapical pleural/pulmonary scarring. Diffuse bronchial wall thickening. Peripheral left upper lobe 9 x 8 mm ground-glass airspace opacity (4:22). Few vague scattered peribronchovascular ground-glass airspace opacities within the right upper lobe. No pulmonary mass. Bilateral trace pleural effusions. No pneumothorax. Musculoskeletal: Bilateral gynecomastia. No suspicious lytic or blastic osseous lesions. No acute displaced fracture. Multilevel degenerative changes of the spine. CT ABDOMEN PELVIS FINDINGS Hepatobiliary: No focal liver abnormality. Status post cholecystectomy. No biliary dilatation. Pancreas: No focal lesion. Normal pancreatic contour. No surrounding inflammatory changes. No main pancreatic ductal dilatation. Spleen: Normal in size without focal abnormality. Adrenals/Urinary Tract: No adrenal nodule bilaterally. No nephrolithiasis and no hydronephrosis. 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 Subcentimeter hypodensities are too small to characterize-no further follow-up indicated. No ureterolithiasis or hydroureter. The urinary bladder is unremarkable. Stomach/Bowel: Stomach is within normal limits. No evidence of bowel wall thickening or dilatation. Colonic diverticulosis.  Appendix appears normal. Vascular/Lymphatic: No abdominal aorta or iliac aneurysm. Severe atherosclerotic plaque of the aorta and its branches. No abdominal, pelvic, or inguinal lymphadenopathy. Reproductive: Prostate is unremarkable. Other: No intraperitoneal free fluid. No intraperitoneal free gas. No organized fluid  collection. Musculoskeletal: No abdominal wall hernia or abnormality. No suspicious lytic or blastic osseous lesions. No acute displaced fracture. Multilevel degenerative changes of the spine. IMPRESSION: 1. Diffuse bronchial wall thickening with associated persistent vague scattered peribronchovascular ground-glass airspace opacities within the right upper lobe that may represent infection/inflammation. Interval increase in size of a peripheral left upper lobe 9 mm (from 7 mm) ground-glass pulmonary nodule. Adenocarcinoma is not excluded. Additional imaging evaluation or consultation with Pulmonology or Thoracic Surgery recommended. 2. Bilateral trace pleural effusions. 3. Colonic diverticulosis with no acute diverticulitis. 4. Aortic Atherosclerosis (ICD10-I70.0) including four-vessel coronary calcification. Emphysema (ICD10-J43.9). Electronically Signed   By: Tish Frederickson M.D.   On: 01/15/2023 22:52   DG Abd Portable 1V  Result Date: 01/15/2023 CLINICAL DATA:  One day history of EXAM: PORTABLE ABDOMEN - 1 VIEW COMPARISON:  CT abdomen and pelvis dated 08/30/2022 FINDINGS: Relative paucity of bowel gas. No free air or pneumatosis. No abnormal radio-opaque calculi or mass effect. No acute or substantial osseous abnormality. The sacrum and coccyx are partially obscured by overlying bowel contents. Right upper quadrant cholecystectomy clips. IMPRESSION: Relative paucity of bowel gas. No free air. Electronically Signed   By: Agustin Cree M.D.   On: 01/15/2023 18:24   DG Chest Port 1 View  Result Date: 01/15/2023 CLINICAL DATA:  Pain EXAM: PORTABLE CHEST 1 VIEW COMPARISON:  Chest radiograph dated  10/13/2022 FINDINGS: Normal lung volumes. No focal consolidations. No pleural effusion or pneumothorax. Similar mildly enlarged cardiomediastinal silhouette. No acute osseous abnormality. IMPRESSION: 1.  No focal consolidations. 2. Similar mildly enlarged cardiomediastinal silhouette. Electronically Signed   By: Agustin Cree M.D.   On: 01/15/2023 18:23     Discharge Exam: Vitals:   01/19/23 0600 01/19/23 0722  BP: (!) 153/74 (!) 152/71  Pulse: 95 99  Resp: 20 19  Temp: 97.8 F (36.6 C) 97.9 F (36.6 C)  SpO2: 91% 91%   Vitals:   01/18/23 2300 01/19/23 0103 01/19/23 0600 01/19/23 0722  BP:  (!) 144/82 (!) 153/74 (!) 152/71  Pulse: 89 85 95 99  Resp: (!) 21 20 20 19   Temp:  97.7 F (36.5 C) 97.8 F (36.6 C) 97.9 F (36.6 C)  TempSrc:  Oral Oral Oral  SpO2: 95% 93% 91% 91%  Weight:   76.3 kg   Height:        General: Pt is alert, awake, not in acute distress Cardiovascular: RRR, S1/S2 +, no rubs, no gallops Respiratory: CTA bilaterally, no wheezing, no rhonchi, nasal cannula oxygen Abdominal: Soft, NT, ND, bowel sounds + Extremities: no edema, no cyanosis    The results of significant diagnostics from this hospitalization (including imaging, microbiology, ancillary and laboratory) are listed below for reference.     Microbiology: Recent Results (from the past 240 hour(s))  Culture, blood (Routine x 2)     Status: None (Preliminary result)   Collection Time: 01/15/23  6:01 PM   Specimen: BLOOD  Result Value Ref Range Status   Specimen Description BLOOD RIGHT ANTECUBITAL  Final   Special Requests   Final    BOTTLES DRAWN AEROBIC AND ANAEROBIC Blood Culture results may not be optimal due to an inadequate volume of blood received in culture bottles   Culture   Final    NO GROWTH 4 DAYS Performed at [facility], [address] [place], [place] [address]    Report Status PENDING  Incomplete  Culture, blood (Routine x 2)     Status: None (Preliminary result)  Collection  Time: 01/15/23  6:05 PM   Specimen: BLOOD  Result Value Ref Range Status   Specimen Description BLOOD BLOOD RIGHT FOREARM  Final   Special Requests   Final    BOTTLES DRAWN AEROBIC AND ANAEROBIC Blood Culture adequate volume   Culture   Final    NO GROWTH 4 DAYS Performed at [facility], [address] [place], [place] [address]    Report Status PENDING  Incomplete  Respiratory (~20 pathogens) panel by PCR     Status: None   Collection Time: 01/16/23  1:22 AM   Specimen: Nasopharyngeal Swab; Respiratory  Result Value Ref Range Status   Adenovirus NOT DETECTED NOT DETECTED Final   Coronavirus 229E NOT DETECTED NOT DETECTED Final    Comment: (NOTE) The Coronavirus on the Respiratory Panel, DOES NOT test for the novel  Coronavirus (2019 nCoV)    Coronavirus HKU1 NOT DETECTED NOT DETECTED Final   Coronavirus NL63 NOT DETECTED NOT DETECTED Final   Coronavirus OC43 NOT DETECTED NOT DETECTED Final   Metapneumovirus NOT DETECTED NOT DETECTED Final   Rhinovirus / Enterovirus NOT DETECTED NOT DETECTED Final   Influenza A NOT DETECTED NOT DETECTED Final   Influenza B NOT DETECTED NOT DETECTED Final   Parainfluenza Virus 1 NOT DETECTED NOT DETECTED Final   Parainfluenza Virus 2 NOT DETECTED NOT DETECTED Final   Parainfluenza Virus 3 NOT DETECTED NOT DETECTED Final   Parainfluenza Virus 4 NOT DETECTED NOT DETECTED Final   Respiratory Syncytial Virus NOT DETECTED NOT DETECTED Final   Bordetella pertussis NOT DETECTED NOT DETECTED Final   Bordetella Parapertussis NOT DETECTED NOT DETECTED Final   Chlamydophila pneumoniae NOT DETECTED NOT DETECTED Final   Mycoplasma pneumoniae NOT DETECTED NOT DETECTED Final    Comment: Performed at [facility], [address], [place], [place] [address]  SARS Coronavirus 2 by RT PCR (hospital order, performed in [facility] hospital lab) *cepheid single result test*     Status: None   Collection Time: 01/16/23  1:22 AM  Result Value Ref Range Status    SARS Coronavirus 2 by RT PCR NEGATIVE NEGATIVE Final    Comment: Performed at [facility], [address] [place], [place] [address]     Labs: BNP (last 3 results) Recent Labs    08/30/22 0725  BNP 340.0*   Basic Metabolic Panel: Recent Labs  Lab 01/15/23 1806 01/16/23 0115 01/16/23 0357 01/17/23 0052 01/18/23 0039 01/19/23 0034  NA 137  --  141 138 135 135  K 2.4* 2.8* 3.9 3.1* 3.1* 3.6  CL 96*  --  106 103 100 100  CO2 20*  --  22 23 26 25   GLUCOSE 190*  --  155* 107* 116* 112*  BUN 27*  --  22 26* 28* 28*  CREATININE 2.59*  --  2.41* 2.30* 2.54* 2.47*  CALCIUM 9.0  --  8.2* 8.1* 7.8* 8.0*  MG 1.8  --   --   --  1.5* 2.0  PHOS  --   --   --   --  2.7 2.7   Liver Function Tests: Recent Labs  Lab 01/15/23 1806 01/16/23 0357 01/17/23 0052 01/18/23 0039 01/19/23 0034  AST 35 28 26  --   --   ALT 14 11 13   --   --   ALKPHOS 182* 146* 144*  --   --   BILITOT 1.7* 1.2 0.9  --   --   PROT 7.3 5.8* 5.3*  --   --  ALBUMIN 3.4* 2.6* 2.3* 2.3* 2.3*   Recent Labs  Lab 01/15/23 1948  LIPASE 28   No results for input(s): "AMMONIA" in the last 168 hours. CBC: Recent Labs  Lab 01/15/23 1806 01/16/23 0357 01/17/23 0052 01/18/23 0039 01/19/23 0034  WBC 9.2 11.0* 13.3* 10.5 9.7  NEUTROABS 8.2*  --   --   --   --   HGB 9.4* 8.2* 7.9* 8.0* 8.6*  HCT 27.9* 25.9* 25.3* 25.8* 27.2*  MCV 79.3* 81.7 84.1 83.5 81.4  PLT 353 272 274 266 293   Cardiac Enzymes: No results for input(s): "CKTOTAL", "CKMB", "CKMBINDEX", "TROPONINI" in the last 168 hours. BNP: Invalid input(s): "POCBNP" CBG: Recent Labs  Lab 01/18/23 0624 01/18/23 1120 01/18/23 1606 01/18/23 2113 01/19/23 0608  GLUCAP 119* 128* 110* 137* 105*   D-Dimer No results for input(s): "DDIMER" in the last 72 hours. Hgb A1c No results for input(s): "HGBA1C" in the last 72 hours. Lipid Profile No results for input(s): "CHOL", "HDL", "LDLCALC", "TRIG", "CHOLHDL", "LDLDIRECT" in the last 72  hours. Thyroid function studies No results for input(s): "TSH", "T4TOTAL", "T3FREE", "THYROIDAB" in the last 72 hours.  Invalid input(s): "FREET3" Anemia work up No results for input(s): "VITAMINB12", "FOLATE", "FERRITIN", "TIBC", "IRON", "RETICCTPCT" in the last 72 hours. Urinalysis    Component Value Date/Time   COLORURINE YELLOW 01/15/2023 2129   APPEARANCEUR CLEAR 01/15/2023 2129   LABSPEC 1.006 01/15/2023 2129   PHURINE 7.0 01/15/2023 2129   GLUCOSEU NEGATIVE 01/15/2023 2129   HGBUR MODERATE (A) 01/15/2023 2129   BILIRUBINUR NEGATIVE 01/15/2023 2129   BILIRUBINUR NEG 06/12/2014 1131   KETONESUR 5 (A) 01/15/2023 2129   PROTEINUR 30 (A) 01/15/2023 2129   UROBILINOGEN negative 06/12/2014 1131   UROBILINOGEN 1.0 05/23/2012 1534   NITRITE NEGATIVE 01/15/2023 2129   LEUKOCYTESUR NEGATIVE 01/15/2023 2129   Sepsis Labs Recent Labs  Lab 01/16/23 0357 01/17/23 0052 01/18/23 0039 01/19/23 0034  WBC 11.0* 13.3* 10.5 9.7   Microbiology Recent Results (from the past 240 hour(s))  Culture, blood (Routine x 2)     Status: None (Preliminary result)   Collection Time: 01/15/23  6:01 PM   Specimen: BLOOD  Result Value Ref Range Status   Specimen Description BLOOD RIGHT ANTECUBITAL  Final   Special Requests   Final    BOTTLES DRAWN AEROBIC AND ANAEROBIC Blood Culture results may not be optimal due to an inadequate volume of blood received in culture bottles   Culture   Final    NO GROWTH 4 DAYS Performed at [facility], [address], [place], [place] [address]    Report Status PENDING  Incomplete  Culture, blood (Routine x 2)     Status: None (Preliminary result)   Collection Time: 01/15/23  6:05 PM   Specimen: BLOOD  Result Value Ref Range Status   Specimen Description BLOOD BLOOD RIGHT FOREARM  Final   Special Requests   Final    BOTTLES DRAWN AEROBIC AND ANAEROBIC Blood Culture adequate volume   Culture   Final    NO GROWTH 4 DAYS Performed at [facility], [address], [place], [place] [address]    Report Status PENDING  Incomplete  Respiratory (~20 pathogens) panel by PCR     Status: None   Collection Time: 01/16/23  1:22 AM   Specimen: Nasopharyngeal Swab; Respiratory  Result Value Ref Range Status   Adenovirus NOT DETECTED NOT DETECTED Final   Coronavirus 229E NOT DETECTED NOT DETECTED Final    Comment: (NOTE) The  Coronavirus on the Respiratory Panel, DOES NOT test for the novel  Coronavirus (2019 nCoV)    Coronavirus HKU1 NOT DETECTED NOT DETECTED Final   Coronavirus NL63 NOT DETECTED NOT DETECTED Final   Coronavirus OC43 NOT DETECTED NOT DETECTED Final   Metapneumovirus NOT DETECTED NOT DETECTED Final   Rhinovirus / Enterovirus NOT DETECTED NOT DETECTED Final   Influenza A NOT DETECTED NOT DETECTED Final   Influenza B NOT DETECTED NOT DETECTED Final   Parainfluenza Virus 1 NOT DETECTED NOT DETECTED Final   Parainfluenza Virus 2 NOT DETECTED NOT DETECTED Final   Parainfluenza Virus 3 NOT DETECTED NOT DETECTED Final   Parainfluenza Virus 4 NOT DETECTED NOT DETECTED Final   Respiratory Syncytial Virus NOT DETECTED NOT DETECTED Final   Bordetella pertussis NOT DETECTED NOT DETECTED Final   Bordetella Parapertussis NOT DETECTED NOT DETECTED Final   Chlamydophila pneumoniae NOT DETECTED NOT DETECTED Final   Mycoplasma pneumoniae NOT DETECTED NOT DETECTED Final    Comment: Performed at [facility], [address] [place], [place] [address]  SARS Coronavirus 2 by RT PCR (hospital order, performed in [facility] hospital lab) *cepheid single result test*     Status: None   Collection Time: 01/16/23  1:22 AM  Result Value Ref Range Status   SARS Coronavirus 2 by RT PCR NEGATIVE NEGATIVE Final    Comment: Performed at [facility], [address] [place], [place] [address]     Time coordinating discharge: 35 minutes  SIGNED:   Erick Blinks, DO [facility] 01/19/2023, 8:43 AM  If 7PM-7AM, please contact  night-coverage [email or web address]

## 2023-01-19 NOTE — Plan of Care (Signed)

## 2023-01-20 ENCOUNTER — Telehealth: Payer: Self-pay

## 2023-01-20 NOTE — Transitions of Care (Post Inpatient/ED Visit) (Signed)
01/20/2023  Name: James [name] MRN: [id number] DOB: 08-12-1945  Today's TOC FU Call Status: Today's TOC FU Call Status:: Successful TOC FU Call Competed TOC FU Call Complete Date: 01/20/23  Transition Care Management Follow-up Telephone Call Date of Discharge: 01/19/23 Discharge Facility: [facility]) Type of Discharge: Inpatient Admission Primary Inpatient Discharge Diagnosis:: Lactic Acidosis How have you been since you were released from the hospital?: Better (Patient notes he is feeling better, remains weak) Any questions or concerns?: No  Items Reviewed: Did you receive and understand the discharge instructions provided?: Yes Medications obtained,verified, and reconciled?: Partial Review Completed Reason for Partial Mediation Review: Only able to discuss discharge antibiotic (Augmentin) Any new allergies since your discharge?: No Dietary orders reviewed?: Yes Type of Diet Ordered:: Low sodium, heart healthy Do you have support at home?: Yes People in Home: spouse Name of Support/Comfort Primary Source: [place]  Medications Reviewed Today: Medications Reviewed Today     Reviewed by Jodelle Gross, RN (Case Manager) on 01/20/23 at 1248  Med List Status: <None>   Medication Order Taking? Sig Documenting Provider Last Dose Status Informant  allopurinol (ZYLOPRIM) 100 MG tablet [id number]  Take 1 tablet (100 mg total) by mouth 2 (two) times daily. Barrett, Rae Roam, PA-C  Active Child, Pharmacy Records  amLODipine-atorvastatin (CADUET) 10-40 MG tablet [id number]  Take 1 tablet by mouth every evening. Barrett, Rae Roam, PA-C  Active Child, Pharmacy Records  amoxicillin-clavulanate (AUGMENTIN) 500-125 MG tablet [id number] Yes Take 1 tablet by mouth 2 (two) times daily for 4 days. Sherryll Burger, Pratik D, DO Taking Active   apixaban (ELIQUIS) 5 MG TABS tablet [id number]  Take 1 tablet (5 mg total) by mouth 2 (two) times daily. Eustace Pen, PA-C  Active Child, Pharmacy Records   aspirin EC 81 MG tablet [id number]  Take 1 tablet (81 mg total) by mouth daily. Swallow whole. Barrett, Rae Roam, PA-C  Active Child, Pharmacy Records  Cholecalciferol (VITAMIN D3) 50 MCG (2000 UT) TABS [id number]  Take 2,000 Units by mouth daily. [provider]  Active Pharmacy Records, Child  cloNIDine (CATAPRES) 0.3 MG tablet [id number]  Take 0.3 mg by mouth 3 (three) times daily. [provider]  Active Pharmacy Records, Child  donepezil (ARICEPT) 5 MG tablet [id number]  Take 1 tablet (5 mg total) by mouth at bedtime. [name], [name], MD  Active Pharmacy Records, Child  doxazosin (CARDURA) 2 MG tablet [id number]  Take 4 mg by mouth 2 (two) times daily. [provider]  Active Pharmacy Records, Child  ferrous sulfate 325 (65 FE) MG tablet [id number]  Take 325 mg by mouth in the morning and at bedtime. [provider]  Active Pharmacy Records, Child  furosemide (LASIX) 80 MG tablet [id number]  Taking 120 mg by mouth in the am and 80 mg in the evening [provider]  Active Pharmacy Records, Child  metoprolol tartrate (LOPRESSOR) 25 MG tablet [id number]  Take 1 tablet (25 mg total) by mouth 2 (two) times daily. Eustace Pen, PA-C  Active Child, Pharmacy Records  potassium chloride (MICRO-K) 10 MEQ CR capsule [id number]  Take 10 mEq by mouth daily. [provider]  Active Pharmacy Records, Child  sertraline (ZOLOFT) 50 MG tablet [id number]  Take 50 mg by mouth daily. [provider]  Active Child, Pharmacy Records            Home Care and Equipment/Supplies: Were Home Health Services Ordered?: No Any new equipment or medical supplies ordered?: Yes Name of Medical  supply agency?: [employer] Were you able to get the equipment/medical supplies?: Yes Do you have any questions related to the use of the equipment/supplies?: No  Functional Questionnaire: Do you need assistance with bathing/showering or dressing?: No Do you need  assistance with meal preparation?: No Do you need assistance with eating?: No Do you have difficulty maintaining continence: No Do you need assistance with getting out of bed/getting out of a chair/moving?: No Do you have difficulty managing or taking your medications?: Yes  Follow up appointments reviewed: PCP Follow-up appointment confirmed?: Yes Date of PCP follow-up appointment?: 01/25/23 Follow-up Provider: Dr. Louanne Skye Specialist [facility] Follow-up appointment confirmed?: NA Do you need transportation to your follow-up appointment?: No Do you understand care options if your condition(s) worsen?: Yes-patient verbalized understanding  SDOH Interventions Today    Flowsheet Row Most Recent Value  SDOH Interventions   Food Insecurity Interventions Intervention Not Indicated  Housing Interventions Intervention Not Indicated  Transportation Interventions Intervention Not Indicated  Utilities Interventions Intervention Not Indicated  Financial Strain Interventions Intervention Not Indicated      Jodelle Gross, RN, BSN, CCM Care Management Coordinator [facility] Phone: [phone]/Fax: [phone]

## 2023-01-25 ENCOUNTER — Encounter: Payer: Self-pay | Admitting: Family Medicine

## 2023-01-25 ENCOUNTER — Ambulatory Visit (INDEPENDENT_AMBULATORY_CARE_PROVIDER_SITE_OTHER): Payer: Medicare Other | Admitting: Family Medicine

## 2023-01-25 VITALS — BP 141/74 | HR 71 | Ht 70.0 in | Wt 174.0 lb

## 2023-01-25 DIAGNOSIS — K529 Noninfective gastroenteritis and colitis, unspecified: Secondary | ICD-10-CM

## 2023-01-25 DIAGNOSIS — J690 Pneumonitis due to inhalation of food and vomit: Secondary | ICD-10-CM

## 2023-01-25 DIAGNOSIS — J9601 Acute respiratory failure with hypoxia: Secondary | ICD-10-CM

## 2023-01-25 DIAGNOSIS — R918 Other nonspecific abnormal finding of lung field: Secondary | ICD-10-CM | POA: Diagnosis not present

## 2023-01-25 NOTE — Progress Notes (Signed)
BP (!) 141/74   Pulse 71   Ht 5\' 10"  (1.778 m)   Wt 174 lb (78.9 kg)   SpO2 94%   BMI 24.97 kg/m    Subjective:   Patient ID: James [name], male    DOB: September 07, 1945, 77 y.o.   MRN: [id number]  HPI: James [name] is a 77 y.o. male presenting on 01/25/2023 for Hospitalization Follow-up   HPI Transitional care/hospital follow-up.  Patient was contacted on 01/20/2023 by Jodelle Gross, RN for transition of care phone call.  Patient was in the hospital on 01/15/2023 and discharged on 01/19/2023.  He was found to have pulmonary nodule on he was admitted for acute hypoxemic respiratory failure and concern for aspiration pneumonia.  He was also diagnosed with enteritis.  He is still having his energy be down.  He does have some alternating diarrhea and constipation that he has been fighting forever but that is back to his normal amount and not increased from where he usually is.  He denies any shortness of breath although his son does say his oxygen dips down and they do keep oxygen most of the time on him at home because he does have oxygen dipped down but has had that from before as well.  He denies any further vomiting.  He does get some belching and burping and gas and indigestion at times as well.   Relevant past medical, surgical, family and social history reviewed and updated as indicated. Interim medical history since our last visit reviewed. Allergies and medications reviewed and updated.  Review of Systems  Constitutional:  Negative for chills and fever.  HENT:  Negative for congestion.   Eyes:  Negative for visual disturbance.  Respiratory:  Positive for cough. Negative for shortness of breath and wheezing.   Cardiovascular:  Negative for chest pain and leg swelling.  Musculoskeletal:  Negative for back pain and gait problem.  Skin:  Negative for rash.  Neurological:  Negative for dizziness, weakness and light-headedness.  All other systems reviewed and are  negative.   Per HPI unless specifically indicated above   Allergies as of 01/25/2023       Reactions   Plavix [clopidogrel Bisulfate] Other (See Comments)   GI Bleed   Uloric [febuxostat] Other (See Comments)   Unknown reaction        Medication List        Accurate as of January 25, 2023  4:50 PM. If you have any questions, ask your nurse or doctor.          allopurinol 100 MG tablet Commonly known as: ZYLOPRIM Take 1 tablet (100 mg total) by mouth 2 (two) times daily.   amLODipine-atorvastatin 10-40 MG tablet Commonly known as: CADUET Take 1 tablet by mouth every evening.   apixaban 5 MG Tabs tablet Commonly known as: ELIQUIS Take 1 tablet (5 mg total) by mouth 2 (two) times daily.   aspirin EC 81 MG tablet Take 1 tablet (81 mg total) by mouth daily. Swallow whole.   cloNIDine 0.3 MG tablet Commonly known as: CATAPRES Take 0.3 mg by mouth 3 (three) times daily.   donepezil 5 MG tablet Commonly known as: ARICEPT Take 1 tablet (5 mg total) by mouth at bedtime.   doxazosin 2 MG tablet Commonly known as: CARDURA Take 4 mg by mouth 2 (two) times daily.   ferrous sulfate 325 (65 FE) MG tablet Take 325 mg by mouth in the morning and at bedtime.   furosemide 80  MG tablet Commonly known as: LASIX Taking 120 mg by mouth in the am and 80 mg in the evening   metoprolol tartrate 25 MG tablet Commonly known as: LOPRESSOR Take 1 tablet (25 mg total) by mouth 2 (two) times daily.   potassium chloride 10 MEQ CR capsule Commonly known as: MICRO-K Take 10 mEq by mouth daily.   sertraline 50 MG tablet Commonly known as: ZOLOFT Take 50 mg by mouth daily.   Vitamin D3 50 MCG (2000 UT) Tabs Take 2,000 Units by mouth daily.         Objective:   BP (!) 141/74   Pulse 71   Ht 5\' 10"  (1.778 m)   Wt 174 lb (78.9 kg)   SpO2 94%   BMI 24.97 kg/m   Wt Readings from Last 3 Encounters:  01/25/23 174 lb (78.9 kg)  01/19/23 168 lb 3.4 oz (76.3 kg)  12/22/22 173  lb 12.8 oz (78.8 kg)    Physical Exam Vitals and nursing note reviewed.  Constitutional:      General: He is not in acute distress.    Appearance: He is well-developed. He is not diaphoretic.  Eyes:     General: No scleral icterus.    Conjunctiva/sclera: Conjunctivae normal.  Neck:     Thyroid: No thyromegaly.  Cardiovascular:     Rate and Rhythm: Normal rate and regular rhythm.     Heart sounds: Normal heart sounds. No murmur heard. Pulmonary:     Effort: Pulmonary effort is normal. No respiratory distress.     Breath sounds: Normal breath sounds. No wheezing, rhonchi or rales.  Abdominal:     General: Abdomen is flat. Bowel sounds are normal. There is no distension.     Tenderness: There is no abdominal tenderness.  Musculoskeletal:        General: Swelling (2+ peripheral edema) present. Normal range of motion.     Cervical back: Neck supple.  Lymphadenopathy:     Cervical: No cervical adenopathy.  Skin:    General: Skin is warm and dry.     Findings: No rash.  Neurological:     Mental Status: He is alert and oriented to person, place, and time.     Coordination: Coordination normal.  Psychiatric:        Behavior: Behavior normal.       Assessment & Plan:   Problem List Items Addressed This Visit       Respiratory   Pulmonary nodules   Relevant Orders   CBC with Differential/Platelet   CMP14+EGFR     Digestive   Enteritis   Relevant Orders   CBC with Differential/Platelet   CMP14+EGFR   Other Visit Diagnoses     Acute hypoxemic respiratory failure (HCC)    -  Primary   Relevant Orders   CBC with Differential/Platelet   CMP14+EGFR   Aspiration pneumonia, unspecified aspiration pneumonia type, unspecified laterality, unspecified part of lung (HCC)       Relevant Orders   CBC with Differential/Platelet   CMP14+EGFR       Recommended Pepcid AC twice a day for a month to see if we can calm down his acid and Imodium A-D as needed to help with the  diarrhea so he does not get enteritis is easily. Follow up plan: Return if symptoms worsen or fail to improve.  Counseling provided for all of the vaccine components Orders Placed This Encounter  Procedures   CBC with Differential/Platelet   CMP14+EGFR  Arville Care, MD [facility] 01/25/2023, 4:50 PM

## 2023-01-26 LAB — CMP14+EGFR
ALT: 12 IU/L (ref 0–44)
AST: 17 IU/L (ref 0–40)
Albumin: 3.6 g/dL — ABNORMAL LOW (ref 3.8–4.8)
Alkaline Phosphatase: 163 IU/L — ABNORMAL HIGH (ref 44–121)
BUN/Creatinine Ratio: 12 (ref 10–24)
BUN: 24 mg/dL (ref 8–27)
Bilirubin Total: 0.5 mg/dL (ref 0.0–1.2)
CO2: 24 mmol/L (ref 20–29)
Calcium: 8.6 mg/dL (ref 8.6–10.2)
Chloride: 99 mmol/L (ref 96–106)
Creatinine, Ser: 1.99 mg/dL — ABNORMAL HIGH (ref 0.76–1.27)
Globulin, Total: 2.7 g/dL (ref 1.5–4.5)
Glucose: 103 mg/dL — ABNORMAL HIGH (ref 70–99)
Potassium: 3.9 mmol/L (ref 3.5–5.2)
Sodium: 138 mmol/L (ref 134–144)
Total Protein: 6.3 g/dL (ref 6.0–8.5)
eGFR: 34 mL/min/{1.73_m2} — ABNORMAL LOW (ref >59)

## 2023-01-26 LAB — CBC WITH DIFFERENTIAL/PLATELET
Basophils Absolute: 0 10*3/uL (ref 0.0–0.2)
Basos: 0 %
EOS (ABSOLUTE): 0.3 10*3/uL (ref 0.0–0.4)
Eos: 4 %
Hematocrit: 30.4 % — ABNORMAL LOW (ref 37.5–51.0)
Hemoglobin: 9.7 g/dL — ABNORMAL LOW (ref 13.0–17.7)
Immature Grans (Abs): 0 10*3/uL (ref 0.0–0.1)
Immature Granulocytes: 0 %
Lymphocytes Absolute: 0.9 10*3/uL (ref 0.7–3.1)
Lymphs: 12 %
MCH: 26.5 pg — ABNORMAL LOW (ref 26.6–33.0)
MCHC: 31.9 g/dL (ref 31.5–35.7)
MCV: 83 fL (ref 79–97)
Monocytes Absolute: 0.5 10*3/uL (ref 0.1–0.9)
Monocytes: 7 %
Neutrophils Absolute: 5.3 10*3/uL (ref 1.4–7.0)
Neutrophils: 77 %
Platelets: 316 10*3/uL (ref 150–450)
RBC: 3.66 x10E6/uL — ABNORMAL LOW (ref 4.14–5.80)
RDW: 15.4 % (ref 11.6–15.4)
WBC: 7 10*3/uL (ref 3.4–10.8)

## 2023-02-09 DIAGNOSIS — N184 Chronic kidney disease, stage 4 (severe): Secondary | ICD-10-CM | POA: Diagnosis not present

## 2023-02-15 DIAGNOSIS — I129 Hypertensive chronic kidney disease with stage 1 through stage 4 chronic kidney disease, or unspecified chronic kidney disease: Secondary | ICD-10-CM | POA: Diagnosis not present

## 2023-02-15 DIAGNOSIS — N2581 Secondary hyperparathyroidism of renal origin: Secondary | ICD-10-CM | POA: Diagnosis not present

## 2023-02-15 DIAGNOSIS — D631 Anemia in chronic kidney disease: Secondary | ICD-10-CM | POA: Diagnosis not present

## 2023-02-15 DIAGNOSIS — N184 Chronic kidney disease, stage 4 (severe): Secondary | ICD-10-CM | POA: Diagnosis not present

## 2023-02-15 DIAGNOSIS — E876 Hypokalemia: Secondary | ICD-10-CM | POA: Diagnosis not present

## 2023-02-15 DIAGNOSIS — R5382 Chronic fatigue, unspecified: Secondary | ICD-10-CM | POA: Diagnosis not present

## 2023-02-15 DIAGNOSIS — R600 Localized edema: Secondary | ICD-10-CM | POA: Diagnosis not present

## 2023-02-15 DIAGNOSIS — I701 Atherosclerosis of renal artery: Secondary | ICD-10-CM | POA: Diagnosis not present

## 2023-02-15 DIAGNOSIS — R5381 Other malaise: Secondary | ICD-10-CM | POA: Diagnosis not present

## 2023-02-19 ENCOUNTER — Ambulatory Visit: Payer: Medicare Other | Admitting: Family Medicine

## 2023-03-22 ENCOUNTER — Encounter: Payer: Self-pay | Admitting: Family Medicine

## 2023-03-22 ENCOUNTER — Ambulatory Visit (INDEPENDENT_AMBULATORY_CARE_PROVIDER_SITE_OTHER): Payer: Medicare Other | Admitting: Family Medicine

## 2023-03-22 VITALS — BP 157/62 | HR 76 | Ht 70.0 in | Wt 172.0 lb

## 2023-03-22 DIAGNOSIS — K2100 Gastro-esophageal reflux disease with esophagitis, without bleeding: Secondary | ICD-10-CM | POA: Diagnosis not present

## 2023-03-22 DIAGNOSIS — I10 Essential (primary) hypertension: Secondary | ICD-10-CM

## 2023-03-22 DIAGNOSIS — E785 Hyperlipidemia, unspecified: Secondary | ICD-10-CM | POA: Diagnosis not present

## 2023-03-22 DIAGNOSIS — N1832 Chronic kidney disease, stage 3b: Secondary | ICD-10-CM | POA: Diagnosis not present

## 2023-03-22 NOTE — Progress Notes (Signed)
BP (!) 157/62   Pulse 76   Ht 5\' 10"  (1.778 m)   Wt 172 lb (78 kg)   SpO2 96%   BMI 24.68 kg/m    Subjective:   Patient ID: James [name], male    DOB: June 17, 1946, 77 y.o.   MRN: [id number]  HPI: James [name] is a 77 y.o. male presenting on 03/22/2023 for Medical Management of Chronic Issues and Hypertension   HPI Hypertension and CKD 3 recheck Patient is currently on amlodipine and clonidine and doxazosin and metoprolol, and their blood pressure today is 157/62 which is actually better than he normally runs. Patient denies any lightheadedness or dizziness. Patient denies headaches, blurred vision, chest pains, shortness of breath, or weakness. Denies any side effects from medication and is content with current medication.   Hyperlipidemia Patient is coming in for recheck of his hyperlipidemia. The patient is currently taking atorvastatin. They deny any issues with myalgias or history of liver damage from it. They deny any focal numbness or weakness or chest pain.   GERD Patient is currently on no medicine currently.  She denies any major symptoms or abdominal pain or belching or burping. She denies any blood in her stool or lightheadedness or dizziness.  He does get some gas but also has loose stools   Relevant past medical, surgical, family and social history reviewed and updated as indicated. Interim medical history since our last visit reviewed. Allergies and medications reviewed and updated.  Review of Systems  Constitutional:  Negative for chills and fever.  Respiratory:  Negative for shortness of breath and wheezing.   Cardiovascular:  Negative for chest pain and leg swelling.  Musculoskeletal:  Negative for back pain and gait problem.  Skin:  Negative for rash.  Psychiatric/Behavioral:  Positive for confusion (Gradual memory deficit progression).   All other systems reviewed and are negative.   Per HPI unless specifically indicated above   Allergies as  of 03/22/2023       Reactions   Plavix [clopidogrel Bisulfate] Other (See Comments)   GI Bleed   Uloric [febuxostat] Other (See Comments)   Unknown reaction        Medication List        Accurate as of March 22, 2023  4:14 PM. If you have any questions, ask your nurse or doctor.          allopurinol 100 MG tablet Commonly known as: ZYLOPRIM Take 1 tablet (100 mg total) by mouth 2 (two) times daily.   amLODipine-atorvastatin 10-40 MG tablet Commonly known as: CADUET Take 1 tablet by mouth every evening.   apixaban 5 MG Tabs tablet Commonly known as: ELIQUIS Take 1 tablet (5 mg total) by mouth 2 (two) times daily.   aspirin EC 81 MG tablet Take 1 tablet (81 mg total) by mouth daily. Swallow whole.   cloNIDine 0.3 MG tablet Commonly known as: CATAPRES Take 0.3 mg by mouth 3 (three) times daily.   donepezil 5 MG tablet Commonly known as: ARICEPT Take 1 tablet (5 mg total) by mouth at bedtime.   doxazosin 2 MG tablet Commonly known as: CARDURA Take 4 mg by mouth 2 (two) times daily.   ferrous sulfate 325 (65 FE) MG tablet Take 325 mg by mouth in the morning and at bedtime.   furosemide 80 MG tablet Commonly known as: LASIX Taking 120 mg by mouth in the am and 80 mg in the evening   metoprolol tartrate 25 MG tablet Commonly known  as: LOPRESSOR Take 1 tablet (25 mg total) by mouth 2 (two) times daily.   potassium chloride 10 MEQ CR capsule Commonly known as: MICRO-K Take 10 mEq by mouth daily.   sertraline 50 MG tablet Commonly known as: ZOLOFT Take 50 mg by mouth daily.   Vitamin D3 50 MCG (2000 UT) Tabs Take 2,000 Units by mouth daily.         Objective:   BP (!) 157/62   Pulse 76   Ht 5\' 10"  (1.778 m)   Wt 172 lb (78 kg)   SpO2 96%   BMI 24.68 kg/m   Wt Readings from Last 3 Encounters:  03/22/23 172 lb (78 kg)  01/25/23 174 lb (78.9 kg)  01/19/23 168 lb 3.4 oz (76.3 kg)    Physical Exam Vitals and nursing note reviewed.   Constitutional:      General: He is not in acute distress.    Appearance: He is well-developed. He is not diaphoretic.  Eyes:     General: No scleral icterus.    Conjunctiva/sclera: Conjunctivae normal.  Neck:     Thyroid: No thyromegaly.  Cardiovascular:     Rate and Rhythm: Normal rate and regular rhythm.     Heart sounds: Normal heart sounds. No murmur heard. Pulmonary:     Effort: Pulmonary effort is normal. No respiratory distress.     Breath sounds: Normal breath sounds. No wheezing.  Musculoskeletal:        General: No swelling. Normal range of motion.     Cervical back: Neck supple.  Lymphadenopathy:     Cervical: No cervical adenopathy.  Skin:    General: Skin is warm and dry.     Findings: No rash.  Neurological:     Mental Status: He is alert and oriented to person, place, and time.     Coordination: Coordination normal.  Psychiatric:        Behavior: Behavior normal.       Assessment & Plan:   Problem List Items Addressed This Visit       Cardiovascular and Mediastinum   Hypertension - Primary     Digestive   Gastroesophageal reflux disease with esophagitis     Genitourinary   Chronic kidney disease (CKD), stage III (moderate) (HCC)     Other   Hyperlipidemia LDL goal <100    We possibly doing Pepcid and Imodium to help with his symptoms.  Blood pressure looks decent for him so continue current medicines.  Blood work looked pretty good a month ago so no blood work today Follow up plan: Return in about 4 months (around 07/22/2023), or if symptoms worsen or fail to improve, for Hypertension and hyperlipidemia.  Counseling provided for all of the vaccine components No orders of the defined types were placed in this encounter.   Arville Care, MD [facility] 03/22/2023, 4:14 PM

## 2023-03-28 ENCOUNTER — Other Ambulatory Visit: Payer: Self-pay | Admitting: Family Medicine

## 2023-03-28 DIAGNOSIS — I10 Essential (primary) hypertension: Secondary | ICD-10-CM

## 2023-03-31 ENCOUNTER — Telehealth: Payer: Self-pay | Admitting: Family Medicine

## 2023-03-31 NOTE — Telephone Encounter (Signed)
  Incoming Patient Call  03/31/2023  What symptoms do you have? Swollen kind of bruised up leg, looks very similar to the last time he had to go to the hospital and he had an infection  How long have you been sick? Started a couple of days ago  Have you been seen for this problem? No, attempted to make an appt but pt son said no no and to send a message to DR. D. Nurse   If your provider decides to give you a prescription, which pharmacy would you like for it to be sent to? [facility]  Patient informed that this information will be sent to the clinical staff for review and that they should receive a follow up call.

## 2023-03-31 NOTE — Telephone Encounter (Signed)
Appt made for 9/9. Son mad aware to go to ER if leg becomes worse before then.

## 2023-03-31 NOTE — Telephone Encounter (Signed)
Left message making pt aware that he will need to be seen in the office for this. It has to be evaluated.  Offered next Monday with [name] on his DOD day or if cannot wait until that long ( I do think he needs to be seen sooner) then schedule with next available prescriber.

## 2023-04-01 ENCOUNTER — Encounter: Payer: Self-pay | Admitting: Family Medicine

## 2023-04-01 ENCOUNTER — Ambulatory Visit (INDEPENDENT_AMBULATORY_CARE_PROVIDER_SITE_OTHER): Payer: Medicare Other | Admitting: Family Medicine

## 2023-04-01 VITALS — BP 133/65 | HR 72 | Ht 70.0 in | Wt 173.0 lb

## 2023-04-01 DIAGNOSIS — L03116 Cellulitis of left lower limb: Secondary | ICD-10-CM | POA: Diagnosis not present

## 2023-04-01 MED ORDER — SULFAMETHOXAZOLE-TRIMETHOPRIM 800-160 MG PO TABS
1.0000 | ORAL_TABLET | Freq: Two times a day (BID) | ORAL | 0 refills | Status: DC
Start: 2023-04-01 — End: 2023-06-25

## 2023-04-01 NOTE — Progress Notes (Signed)
BP 133/65   Pulse 72   Ht 5\' 10"  (1.778 m)   Wt 173 lb (78.5 kg)   SpO2 98%   BMI 24.82 kg/m    Subjective:   Patient ID: James [name], male    DOB: 12-08-1945, 77 y.o.   MRN: [id number]  HPI: James [name] is a 77 y.o. male presenting on 04/01/2023 for Leg Pain (Swelling and redness )   HPI Leg pain and swelling and redness Patient is coming in today with complaints of leg pain and swelling and redness.  Both legs have been swelling but the left leg is significantly more swelling and he started out with what looks like a bruise on the lateral aspect of the left lower leg then started turning into redness and warmth and more pain and it has been going on my diet over the past couple days.  He denies any fevers or chills.  Relevant past medical, surgical, family and social history reviewed and updated as indicated. Interim medical history since our last visit reviewed. Allergies and medications reviewed and updated.  Review of Systems  Constitutional:  Negative for chills and fever.  Eyes:  Negative for discharge.  Respiratory:  Negative for shortness of breath and wheezing.   Cardiovascular:  Positive for leg swelling. Negative for chest pain.  Musculoskeletal:  Negative for back pain and gait problem.  Skin:  Positive for color change. Negative for rash and wound.  All other systems reviewed and are negative.   Per HPI unless specifically indicated above   Allergies as of 04/01/2023       Reactions   Plavix [clopidogrel Bisulfate] Other (See Comments)   GI Bleed   Uloric [febuxostat] Other (See Comments)   Unknown reaction        Medication List        Accurate as of April 01, 2023  2:29 PM. If you have any questions, ask your nurse or doctor.          allopurinol 100 MG tablet Commonly known as: ZYLOPRIM TAKE 2 TABLETS DAILY   amLODipine-atorvastatin 10-40 MG tablet Commonly known as: CADUET TAKE 1 TABLET DAILY   apixaban 5 MG Tabs  tablet Commonly known as: ELIQUIS Take 1 tablet (5 mg total) by mouth 2 (two) times daily.   aspirin EC 81 MG tablet Take 1 tablet (81 mg total) by mouth daily. Swallow whole.   cloNIDine 0.3 MG tablet Commonly known as: CATAPRES Take 0.3 mg by mouth 3 (three) times daily.   donepezil 5 MG tablet Commonly known as: ARICEPT Take 1 tablet (5 mg total) by mouth at bedtime.   doxazosin 2 MG tablet Commonly known as: CARDURA Take 4 mg by mouth 2 (two) times daily.   ferrous sulfate 325 (65 FE) MG tablet Take 325 mg by mouth in the morning and at bedtime.   furosemide 80 MG tablet Commonly known as: LASIX Taking 120 mg by mouth in the am and 80 mg in the evening   metoprolol tartrate 25 MG tablet Commonly known as: LOPRESSOR Take 1 tablet (25 mg total) by mouth 2 (two) times daily.   potassium chloride 10 MEQ CR capsule Commonly known as: MICRO-K Take 10 mEq by mouth daily.   sertraline 50 MG tablet Commonly known as: ZOLOFT Take 50 mg by mouth daily.   sulfamethoxazole-trimethoprim 800-160 MG tablet Commonly known as: BACTRIM DS Take 1 tablet by mouth 2 (two) times daily. Started by: [name] Everlena Mackley   Vitamin  D3 50 MCG (2000 UT) Tabs Take 2,000 Units by mouth daily.         Objective:   BP 133/65   Pulse 72   Ht 5\' 10"  (1.778 m)   Wt 173 lb (78.5 kg)   SpO2 98%   BMI 24.82 kg/m   Wt Readings from Last 3 Encounters:  04/01/23 173 lb (78.5 kg)  03/22/23 172 lb (78 kg)  01/25/23 174 lb (78.9 kg)    Physical Exam Vitals and nursing note reviewed.  Constitutional:      Appearance: Normal appearance. He is normal weight.  Musculoskeletal:        General: Swelling (3+ edema in right lower extremity and 3+ in left lower, slightly more swollen and left lower) present.  Skin:    General: Skin is warm and dry.     Findings: Ecchymosis and erythema present. No wound.       Neurological:     Mental Status: He is alert.       Assessment & Plan:    Problem List Items Addressed This Visit   None Visit Diagnoses     Left leg cellulitis    -  Primary   Relevant Medications   sulfamethoxazole-trimethoprim (BACTRIM DS) 800-160 MG tablet       Nurse is going to apply Ace bandage wraps (on how to do it and they are going to wrap it every morning and then can take it off at bedtime and then rewrap every day Follow up plan: Return if symptoms worsen or fail to improve.  Counseling provided for all of the vaccine components No orders of the defined types were placed in this encounter.   Arville Care, MD [facility] 04/01/2023, 2:29 PM

## 2023-04-05 ENCOUNTER — Ambulatory Visit: Payer: Medicare Other

## 2023-04-05 ENCOUNTER — Ambulatory Visit (HOSPITAL_COMMUNITY): Payer: Medicare Other

## 2023-04-12 ENCOUNTER — Other Ambulatory Visit: Payer: Self-pay | Admitting: *Deleted

## 2023-04-12 DIAGNOSIS — I6523 Occlusion and stenosis of bilateral carotid arteries: Secondary | ICD-10-CM

## 2023-04-14 ENCOUNTER — Ambulatory Visit: Payer: Medicare Other | Admitting: Cardiology

## 2023-04-19 ENCOUNTER — Ambulatory Visit (INDEPENDENT_AMBULATORY_CARE_PROVIDER_SITE_OTHER): Payer: Medicare Other | Admitting: Surgery

## 2023-04-19 ENCOUNTER — Encounter: Payer: Self-pay | Admitting: Surgery

## 2023-04-19 ENCOUNTER — Ambulatory Visit (HOSPITAL_COMMUNITY)
Admission: RE | Admit: 2023-04-19 | Discharge: 2023-04-19 | Disposition: A | Discharge status: Home / Self Care | Payer: Medicare Other | Source: Ambulatory Visit | Attending: Surgery | Admitting: Surgery

## 2023-04-19 VITALS — BP 153/62 | HR 83 | Temp 97.6°F | Resp 18 | Ht 70.0 in | Wt 167.1 lb

## 2023-04-19 DIAGNOSIS — I6523 Occlusion and stenosis of bilateral carotid arteries: Secondary | ICD-10-CM | POA: Insufficient documentation

## 2023-04-19 DIAGNOSIS — I6521 Occlusion and stenosis of right carotid artery: Secondary | ICD-10-CM

## 2023-04-19 NOTE — Progress Notes (Signed)
[facility] [place]  Patient name: James [name] MRN: [id number] DOB: [birth year] Sex: male   REASON FOR VISIT:    Follow up  HISOTRY OF PRESENT ILLNESS:    James [name] is a 77 y.o. male returns today for follow-up.  He is status post left carotid endarterectomy for asymptomatic stenosis in November 2013.  He is status post right carotid endarterectomy in 2008.     In 2009 he underwent a right renal artery stenting secondary to hypertension and an elevated creatinine.  In 2017 he was found to have progressive stenosis within his right renal artery stent and on 12/03/2015 he underwent renal angiography that showed that his right stent was occluded.  His kidney was perfused via an accessory artery.  His creatinine has remained stable.     He denies any new neurologic symptoms such as amaurosis fugax, or numbness or weakness.  His blood pressure has become progressively difficult to control.  His renal function has remained stable  Since I have last seen him, he was hospitalized for pericardial window and a separate For sepsis.  He complains of decreased energy   PAST MEDICAL HISTORY:   Past Medical History:  Diagnosis Date   Arthritis    Carotid artery occlusion    Chronic kidney disease    DVT (deep venous thrombosis) (HCC) 2008   GERD (gastroesophageal reflux disease)    takes Protonix daily   Gout    takes Allopurinol daily and Indomethacin prn   H/O renal artery stenosis    H/O: GI bleed    History of blood transfusion    History of colon polyps    History of gastric ulcer    Hyperlipidemia    takes Lipitor daily   Hypertension    takes Metoprolol and Amlodipine daily   Joint pain    Joint swelling    Sleep apnea      FAMILY HISTORY:   Family History  Problem Relation Age of Onset   Hypertension Mother    Hyperlipidemia Mother    Other Mother        varicose veins   Hyperlipidemia Father     Hypertension Father    Diabetes Father    Clotting disorder Father    Deep vein thrombosis Father    Hypertension Sister    Diabetes Sister    Hyperlipidemia Sister    Varicose Veins Sister    Other Sister        Bleeding problems   Hypertension Brother    Hyperlipidemia Brother     SOCIAL HISTORY:   Social History   Tobacco Use   Smoking status: Former    Current packs/day: 0.00    Average packs/day: 1.5 packs/day for 36.0 years (54.0 ttl pk-yrs)    Types: Cigarettes    Start date: 12/26/1963    Quit date: 12/26/1999    Years since quitting: 23.3   Smokeless tobacco: Never  Substance Use Topics   Alcohol use: No    Comment: Quit in 1998     ALLERGIES:   Allergies  Allergen Reactions   Plavix [Clopidogrel Bisulfate] Other (See Comments)    GI Bleed   Uloric [Febuxostat] Other (See Comments)    Unknown reaction     CURRENT MEDICATIONS:   Current Outpatient Medications  Medication Sig Dispense Refill   allopurinol (ZYLOPRIM) 100 MG tablet TAKE 2 TABLETS DAILY 180 tablet 1   amLODipine-atorvastatin (CADUET) 10-40 MG tablet TAKE 1 TABLET DAILY 90  tablet 1   apixaban (ELIQUIS) 5 MG TABS tablet Take 1 tablet (5 mg total) by mouth 2 (two) times daily. 180 tablet 1   aspirin EC 81 MG tablet Take 1 tablet (81 mg total) by mouth daily. Swallow whole. 30 tablet 12   Cholecalciferol (VITAMIN D3) 50 MCG (2000 UT) TABS Take 2,000 Units by mouth daily.     cloNIDine (CATAPRES) 0.3 MG tablet Take 0.3 mg by mouth 3 (three) times daily.     donepezil (ARICEPT) 5 MG tablet Take 1 tablet (5 mg total) by mouth at bedtime. 90 tablet 3   doxazosin (CARDURA) 2 MG tablet Take 4 mg by mouth 2 (two) times daily.     ferrous sulfate 325 (65 FE) MG tablet Take 325 mg by mouth in the morning and at bedtime.     furosemide (LASIX) 80 MG tablet Taking 120 mg by mouth in the am and 80 mg in the evening     metoprolol tartrate (LOPRESSOR) 25 MG tablet Take 1 tablet (25 mg total) by mouth 2 (two)  times daily. 180 tablet 1   potassium chloride (MICRO-K) 10 MEQ CR capsule Take 10 mEq by mouth daily.     sertraline (ZOLOFT) 50 MG tablet Take 50 mg by mouth daily.     sulfamethoxazole-trimethoprim (BACTRIM DS) 800-160 MG tablet Take 1 tablet by mouth 2 (two) times daily. 20 tablet 0   No current facility-administered medications for this visit.    REVIEW OF SYSTEMS:   [X]  denotes positive finding, [ ]  denotes negative finding Cardiac  Comments:  Chest pain or chest pressure:    Shortness of breath upon exertion:    Short of breath when lying flat:    Irregular heart rhythm:        Vascular    Pain in calf, thigh, or hip brought on by ambulation:    Pain in feet at night that wakes you up from your sleep:     Blood clot in your veins:    Leg swelling:         Pulmonary    Oxygen at home:    Productive cough:     Wheezing:         Neurologic    Sudden weakness in arms or legs:     Sudden numbness in arms or legs:     Sudden onset of difficulty speaking or slurred speech:    Temporary loss of vision in one eye:     Problems with dizziness:         Gastrointestinal    Blood in stool:     Vomited blood:         Genitourinary    Burning when urinating:     Blood in urine:        Psychiatric    Major depression:         Hematologic    Bleeding problems:    Problems with blood clotting too easily:        Skin    Rashes or ulcers:        Constitutional    Fever or chills:      PHYSICAL EXAM:   Vitals:   04/19/23 1450  BP: (!) 153/62  Pulse: 83  Resp: 18  Temp: 97.6 F (36.4 C)  TempSrc: Temporal  SpO2: 98%  Weight: 167 lb 1.6 oz (75.8 kg)  Height: 5\' 10"  (1.778 m)    GENERAL: The patient is a well-nourished male, in no  acute distress. The vital signs are documented above. CARDIAC: There is a regular rate and rhythm.  PULMONARY: Non-labored respirations MUSCULOSKELETAL: There are no major deformities or cyanosis. NEUROLOGIC: No focal weakness or  paresthesias are detected. SKIN: There are no ulcers or rashes noted. PSYCHIATRIC: The patient has a normal affect.  STUDIES:   I have reviewed the following:  Right Carotid: Velocities in the right ICA are consistent with a 1-39%  stenosis.   Left Carotid: Velocities in the left ICA are consistent with a 1-39%  stenosis.   Vertebrals: Bilateral vertebral arteries demonstrate antegrade flow.   MEDICAL ISSUES:   Carotid: No recurrent stenosis.  He remains asymptomatic.  I will plan on surveillance imaging in 2 years    Durene Cal, IV, MD, FACS [facility] [phone] Pager [phone]

## 2023-05-05 ENCOUNTER — Other Ambulatory Visit (HOSPITAL_COMMUNITY): Payer: Medicare Other

## 2023-05-20 DIAGNOSIS — H02102 Unspecified ectropion of right lower eyelid: Secondary | ICD-10-CM | POA: Diagnosis not present

## 2023-05-20 DIAGNOSIS — H2513 Age-related nuclear cataract, bilateral: Secondary | ICD-10-CM | POA: Diagnosis not present

## 2023-05-20 DIAGNOSIS — H52203 Unspecified astigmatism, bilateral: Secondary | ICD-10-CM | POA: Diagnosis not present

## 2023-05-20 DIAGNOSIS — H02105 Unspecified ectropion of left lower eyelid: Secondary | ICD-10-CM | POA: Diagnosis not present

## 2023-05-26 ENCOUNTER — Telehealth: Payer: Self-pay

## 2023-05-26 DIAGNOSIS — F01B0 Vascular dementia, moderate, without behavioral disturbance, psychotic disturbance, mood disturbance, and anxiety: Secondary | ICD-10-CM

## 2023-05-26 MED ORDER — SERTRALINE HCL 50 MG PO TABS: 50.0000 mg | ORAL_TABLET | Freq: Every day | ORAL | 1 refills | Status: DC

## 2023-05-26 MED ORDER — DONEPEZIL HCL 5 MG PO TABS
5.0000 mg | ORAL_TABLET | Freq: Every day | ORAL | 1 refills | Status: DC
Start: 2023-05-26 — End: 2023-12-23

## 2023-05-26 NOTE — Telephone Encounter (Signed)
Son had an appt this am in office and stopped by nurses station to request refill of Sertraline. States that pt may need others refilled just not sure which. [facility].  Per OV note [name] did start pt on Donepezil and Sertraline. Advised son to make sure pt is taking them daily. Pt should have needed a refill before now on both medications. Son understood. 64m supply sent to pharmacy.

## 2023-06-08 ENCOUNTER — Ambulatory Visit (HOSPITAL_COMMUNITY): Payer: Medicare Other | Attending: Cardiology

## 2023-06-08 DIAGNOSIS — I4819 Other persistent atrial fibrillation: Secondary | ICD-10-CM | POA: Diagnosis not present

## 2023-06-09 LAB — ECHOCARDIOGRAM COMPLETE
Calc EF: 59.3 %
MV M vel: 4.73 m/s
MV Peak grad: 89.5 mm[Hg]
Radius: 0.3 cm
S' Lateral: 3.4 cm
Single Plane A2C EF: 61 %
Single Plane A4C EF: 59.3 %

## 2023-06-13 NOTE — Progress Notes (Unsigned)
Cardiology Office Note:   Date:  06/15/2023  ID:  James [name], DOB 1946-05-15, MRN [id number] PCP: [name], [name], MD  [facility] Providers Cardiologist:  Rollene Rotunda, MD {  History of Present Illness:   James [name] is a 77 y.o. male who presents for evaluation of fatigue and difficult to control hypertension and a large pericardial effusion. I followed this for a long while but the effusion became larger and he had elective pericardial window in March 2023.      He had atrial fib and had cardioversion on 11/18/22.  Follow-up echo earlier this month demonstrated very small pericardial effusion.  He had well-preserved ejection fraction.    Since I last saw him he has done relatively well.  He denies any new cardiovascular symptoms.  His biggest issue is fatigue.  He does not use his CPAP.  His son says when he finally does he actually does better but he does not like to use it.  He has problems keeping it on.  He has to get up and go to the bathroom a lot.  He denies any new shortness of breath, PND or orthopnea.  He said no new palpitations, presyncope or syncope.  He said no chest pressure, neck or arm discomfort.  He has chronic lower extremity swelling that is better when he wakes up in the morning.  This is better than previous.  ROS: As stated in the HPI and negative for all other systems.  Studies Reviewed:    EKG:   EKG Interpretation Date/Time:  Tuesday June 15 2023 16:51:31 EST Ventricular Rate:  69 PR Interval:    QRS Duration:  108 QT Interval:  478 QTC Calculation: 512 R Axis:   65  Text Interpretation: Atrial fibrillation Possible Anterior infarct , age undetermined When compared with ECG of 15-Jan-2023 17:43,  rate is slower Confirmed by Rollene Rotunda ([id number]) on 06/15/2023 4:55:43 PM     Risk Assessment/Calculations:    CHA2DS2-VASc Score = 4   This indicates a 4.8% annual risk of stroke. The patient's score is based upon: CHF  History: 0 HTN History: 1 Diabetes History: 0 Stroke History: 0 Vascular Disease History: 1 Age Score: 2 Gender Score: 0   Physical Exam:   VS:  BP (!) 148/64 (BP Location: Left Arm, Patient Position: Sitting, Cuff Size: Normal)   Pulse 69   Ht 5\' 10"  (1.778 m)   Wt 181 lb 1.6 oz (82.1 kg)   SpO2 100%   BMI 25.99 kg/m    Wt Readings from Last 3 Encounters:  06/15/23 181 lb 1.6 oz (82.1 kg)  04/19/23 167 lb 1.6 oz (75.8 kg)  04/01/23 173 lb (78.5 kg)     GEN: Well nourished, well developed in no acute distress NECK: Positive JVD; No carotid bruits CARDIAC: Irregular RR, no murmurs, rubs, gallops RESPIRATORY:  Clear to auscultation without rales, wheezing or rhonchi  ABDOMEN: Soft, non-tender, non-distended EXTREMITIES:  Moderate leg edema; No deformity   ASSESSMENT AND PLAN:   PERICARDIAL EFFUSION:   This was small on the follow-up echocardiogram.  No change in therapy.  HTN:  His BP is mildly elevated but he is already on maximal therapy.  He does not tolerate a lot of medications because of his renal insufficiency.  No change in therapy.  This is the best his blood pressure has been.   RAS:   He is followed by nephrology.  His renal insufficiency is stable with a creatinine of 1.99.  No change in therapy.   He follows with Dr. Myra Gianotti   CAROTID STENOSIS:  This has been mild.  No change in therapy   CKD:    Creat is 1.99  ATRIAL FIB:   He tolerates anticoagulation.  He is up-to-date with blood work.  This seems to be persistent and asymptomatic.  No change in therapy.     Follow up with me in one year.   Signed, Rollene Rotunda, MD

## 2023-06-15 ENCOUNTER — Ambulatory Visit: Payer: Medicare Other | Attending: Cardiology | Admitting: Cardiology

## 2023-06-15 ENCOUNTER — Encounter: Payer: Self-pay | Admitting: Cardiology

## 2023-06-15 VITALS — BP 148/64 | HR 69 | Ht 70.0 in | Wt 181.1 lb

## 2023-06-15 DIAGNOSIS — N1832 Chronic kidney disease, stage 3b: Secondary | ICD-10-CM | POA: Diagnosis not present

## 2023-06-15 DIAGNOSIS — I4891 Unspecified atrial fibrillation: Secondary | ICD-10-CM

## 2023-06-15 DIAGNOSIS — I3131 Malignant pericardial effusion in diseases classified elsewhere: Secondary | ICD-10-CM

## 2023-06-15 DIAGNOSIS — I10 Essential (primary) hypertension: Secondary | ICD-10-CM

## 2023-06-15 DIAGNOSIS — I4819 Other persistent atrial fibrillation: Secondary | ICD-10-CM

## 2023-06-15 NOTE — Patient Instructions (Signed)
Medication Instructions:  No changes.  *If you need a refill on your cardiac medications before your next appointment, please call your pharmacy*     Follow-Up: At [facility], you and your health needs are our priority.  As part of our continuing mission to provide you with exceptional heart care, we have created designated Provider Care Teams.  These Care Teams include your primary Cardiologist (physician) and Advanced Practice Providers (APPs -  Physician Assistants and Nurse Practitioners) who all work together to provide you with the care you need, when you need it.  Your next appointment:   12 month(s)  Provider:   Rollene Rotunda, MD

## 2023-06-17 DIAGNOSIS — Z23 Encounter for immunization: Secondary | ICD-10-CM | POA: Diagnosis not present

## 2023-06-17 DIAGNOSIS — S61412A Laceration without foreign body of left hand, initial encounter: Secondary | ICD-10-CM | POA: Diagnosis not present

## 2023-06-24 ENCOUNTER — Other Ambulatory Visit: Payer: Self-pay

## 2023-06-24 ENCOUNTER — Emergency Department (HOSPITAL_COMMUNITY): Payer: Medicare Other

## 2023-06-24 ENCOUNTER — Encounter (HOSPITAL_COMMUNITY): Payer: Self-pay

## 2023-06-24 ENCOUNTER — Inpatient Hospital Stay (HOSPITAL_COMMUNITY)
Admission: EM | Admit: 2023-06-24 | Discharge: 2023-06-28 | DRG: 291 | Disposition: A | Discharge status: Home / Self Care | Payer: Medicare Other | Attending: Internal Medicine | Admitting: Internal Medicine

## 2023-06-24 DIAGNOSIS — I701 Atherosclerosis of renal artery: Secondary | ICD-10-CM | POA: Diagnosis present

## 2023-06-24 DIAGNOSIS — J42 Unspecified chronic bronchitis: Secondary | ICD-10-CM | POA: Diagnosis not present

## 2023-06-24 DIAGNOSIS — R069 Unspecified abnormalities of breathing: Secondary | ICD-10-CM | POA: Diagnosis not present

## 2023-06-24 DIAGNOSIS — E8770 Fluid overload, unspecified: Secondary | ICD-10-CM | POA: Diagnosis not present

## 2023-06-24 DIAGNOSIS — N184 Chronic kidney disease, stage 4 (severe): Secondary | ICD-10-CM | POA: Diagnosis present

## 2023-06-24 DIAGNOSIS — I509 Heart failure, unspecified: Secondary | ICD-10-CM

## 2023-06-24 DIAGNOSIS — J441 Chronic obstructive pulmonary disease with (acute) exacerbation: Secondary | ICD-10-CM | POA: Diagnosis present

## 2023-06-24 DIAGNOSIS — Z7901 Long term (current) use of anticoagulants: Secondary | ICD-10-CM | POA: Diagnosis not present

## 2023-06-24 DIAGNOSIS — D509 Iron deficiency anemia, unspecified: Secondary | ICD-10-CM | POA: Insufficient documentation

## 2023-06-24 DIAGNOSIS — Z9861 Coronary angioplasty status: Secondary | ICD-10-CM | POA: Diagnosis not present

## 2023-06-24 DIAGNOSIS — I4819 Other persistent atrial fibrillation: Secondary | ICD-10-CM | POA: Diagnosis present

## 2023-06-24 DIAGNOSIS — Z8711 Personal history of peptic ulcer disease: Secondary | ICD-10-CM

## 2023-06-24 DIAGNOSIS — Z79899 Other long term (current) drug therapy: Secondary | ICD-10-CM

## 2023-06-24 DIAGNOSIS — I771 Stricture of artery: Secondary | ICD-10-CM | POA: Diagnosis not present

## 2023-06-24 DIAGNOSIS — J449 Chronic obstructive pulmonary disease, unspecified: Secondary | ICD-10-CM

## 2023-06-24 DIAGNOSIS — J811 Chronic pulmonary edema: Secondary | ICD-10-CM | POA: Diagnosis not present

## 2023-06-24 DIAGNOSIS — J9621 Acute and chronic respiratory failure with hypoxia: Secondary | ICD-10-CM | POA: Diagnosis not present

## 2023-06-24 DIAGNOSIS — E1122 Type 2 diabetes mellitus with diabetic chronic kidney disease: Secondary | ICD-10-CM | POA: Diagnosis present

## 2023-06-24 DIAGNOSIS — I5033 Acute on chronic diastolic (congestive) heart failure: Secondary | ICD-10-CM | POA: Diagnosis not present

## 2023-06-24 DIAGNOSIS — I130 Hypertensive heart and chronic kidney disease with heart failure and stage 1 through stage 4 chronic kidney disease, or unspecified chronic kidney disease: Principal | ICD-10-CM | POA: Diagnosis present

## 2023-06-24 DIAGNOSIS — E785 Hyperlipidemia, unspecified: Secondary | ICD-10-CM | POA: Diagnosis present

## 2023-06-24 DIAGNOSIS — Z833 Family history of diabetes mellitus: Secondary | ICD-10-CM

## 2023-06-24 DIAGNOSIS — I480 Paroxysmal atrial fibrillation: Secondary | ICD-10-CM | POA: Diagnosis not present

## 2023-06-24 DIAGNOSIS — I10 Essential (primary) hypertension: Secondary | ICD-10-CM | POA: Diagnosis present

## 2023-06-24 DIAGNOSIS — F0390 Unspecified dementia without behavioral disturbance: Secondary | ICD-10-CM

## 2023-06-24 DIAGNOSIS — Z8249 Family history of ischemic heart disease and other diseases of the circulatory system: Secondary | ICD-10-CM

## 2023-06-24 DIAGNOSIS — J9601 Acute respiratory failure with hypoxia: Secondary | ICD-10-CM | POA: Diagnosis not present

## 2023-06-24 DIAGNOSIS — D649 Anemia, unspecified: Secondary | ICD-10-CM

## 2023-06-24 DIAGNOSIS — E876 Hypokalemia: Secondary | ICD-10-CM | POA: Diagnosis present

## 2023-06-24 DIAGNOSIS — Z7982 Long term (current) use of aspirin: Secondary | ICD-10-CM

## 2023-06-24 DIAGNOSIS — N179 Acute kidney failure, unspecified: Secondary | ICD-10-CM | POA: Diagnosis present

## 2023-06-24 DIAGNOSIS — E1165 Type 2 diabetes mellitus with hyperglycemia: Secondary | ICD-10-CM | POA: Diagnosis present

## 2023-06-24 DIAGNOSIS — Z794 Long term (current) use of insulin: Secondary | ICD-10-CM | POA: Diagnosis not present

## 2023-06-24 DIAGNOSIS — R918 Other nonspecific abnormal finding of lung field: Secondary | ICD-10-CM | POA: Diagnosis not present

## 2023-06-24 DIAGNOSIS — R0902 Hypoxemia: Secondary | ICD-10-CM | POA: Diagnosis not present

## 2023-06-24 DIAGNOSIS — I4891 Unspecified atrial fibrillation: Secondary | ICD-10-CM | POA: Diagnosis not present

## 2023-06-24 DIAGNOSIS — K219 Gastro-esophageal reflux disease without esophagitis: Secondary | ICD-10-CM | POA: Diagnosis present

## 2023-06-24 DIAGNOSIS — J929 Pleural plaque without asbestos: Secondary | ICD-10-CM | POA: Diagnosis not present

## 2023-06-24 DIAGNOSIS — R531 Weakness: Secondary | ICD-10-CM | POA: Diagnosis not present

## 2023-06-24 DIAGNOSIS — F0393 Unspecified dementia, unspecified severity, with mood disturbance: Secondary | ICD-10-CM | POA: Diagnosis not present

## 2023-06-24 DIAGNOSIS — R0602 Shortness of breath: Secondary | ICD-10-CM | POA: Diagnosis not present

## 2023-06-24 DIAGNOSIS — Z87891 Personal history of nicotine dependence: Secondary | ICD-10-CM

## 2023-06-24 DIAGNOSIS — I499 Cardiac arrhythmia, unspecified: Secondary | ICD-10-CM | POA: Diagnosis not present

## 2023-06-24 DIAGNOSIS — Z86718 Personal history of other venous thrombosis and embolism: Secondary | ICD-10-CM

## 2023-06-24 LAB — I-STAT VENOUS BLOOD GAS, ED
Acid-Base Excess: 2 mmol/L (ref 0.0–2.0)
Bicarbonate: 27.3 mmol/L (ref 20.0–28.0)
Calcium, Ion: 1.06 mmol/L — ABNORMAL LOW (ref 1.15–1.40)
HCT: 24 % — ABNORMAL LOW (ref 39.0–52.0)
Hemoglobin: 8.2 g/dL — ABNORMAL LOW (ref 13.0–17.0)
O2 Saturation: 99 %
Potassium: 3.8 mmol/L (ref 3.5–5.1)
Sodium: 137 mmol/L (ref 135–145)
TCO2: 29 mmol/L (ref 22–32)
pCO2, Ven: 44.3 mm[Hg] (ref 44–60)
pH, Ven: 7.398 (ref 7.25–7.43)
pO2, Ven: 153 mm[Hg] — ABNORMAL HIGH (ref 32–45)

## 2023-06-24 LAB — COMPREHENSIVE METABOLIC PANEL
ALT: 18 U/L (ref 0–44)
AST: 31 U/L (ref 15–41)
Albumin: 3.1 g/dL — ABNORMAL LOW (ref 3.5–5.0)
Alkaline Phosphatase: 138 U/L — ABNORMAL HIGH (ref 38–126)
Anion gap: 10 (ref 5–15)
BUN: 45 mg/dL — ABNORMAL HIGH (ref 8–23)
CO2: 24 mmol/L (ref 22–32)
Calcium: 8.1 mg/dL — ABNORMAL LOW (ref 8.9–10.3)
Chloride: 101 mmol/L (ref 98–111)
Creatinine, Ser: 2.83 mg/dL — ABNORMAL HIGH (ref 0.61–1.24)
GFR, Estimated: 22 mL/min — ABNORMAL LOW (ref >60)
Glucose, Bld: 199 mg/dL — ABNORMAL HIGH (ref 70–99)
Potassium: 3.7 mmol/L (ref 3.5–5.1)
Sodium: 135 mmol/L (ref 135–145)
Total Bilirubin: 1.1 mg/dL (ref <1.2)
Total Protein: 5.9 g/dL — ABNORMAL LOW (ref 6.5–8.1)

## 2023-06-24 LAB — CBC WITH DIFFERENTIAL/PLATELET
Abs Immature Granulocytes: 0.08 10*3/uL — ABNORMAL HIGH (ref 0.00–0.07)
Basophils Absolute: 0 10*3/uL (ref 0.0–0.1)
Basophils Relative: 0 %
Eosinophils Absolute: 0.2 10*3/uL (ref 0.0–0.5)
Eosinophils Relative: 1 %
HCT: 23.4 % — ABNORMAL LOW (ref 39.0–52.0)
Hemoglobin: 7.2 g/dL — ABNORMAL LOW (ref 13.0–17.0)
Immature Granulocytes: 1 %
Lymphocytes Relative: 3 %
Lymphs Abs: 0.4 10*3/uL — ABNORMAL LOW (ref 0.7–4.0)
MCH: 28.5 pg (ref 26.0–34.0)
MCHC: 30.8 g/dL (ref 30.0–36.0)
MCV: 92.5 fL (ref 80.0–100.0)
Monocytes Absolute: 0.5 10*3/uL (ref 0.1–1.0)
Monocytes Relative: 4 %
Neutro Abs: 13.6 10*3/uL — ABNORMAL HIGH (ref 1.7–7.7)
Neutrophils Relative %: 91 %
Platelets: 273 10*3/uL (ref 150–400)
RBC: 2.53 MIL/uL — ABNORMAL LOW (ref 4.22–5.81)
RDW: 13.8 % (ref 11.5–15.5)
WBC: 14.9 10*3/uL — ABNORMAL HIGH (ref 4.0–10.5)
nRBC: 0 % (ref 0.0–0.2)

## 2023-06-24 LAB — TROPONIN I (HIGH SENSITIVITY): Troponin I (High Sensitivity): 271 ng/L (ref <18)

## 2023-06-24 LAB — BRAIN NATRIURETIC PEPTIDE: B Natriuretic Peptide: 1530 pg/mL — ABNORMAL HIGH (ref 0.0–100.0)

## 2023-06-24 MED ORDER — SODIUM CHLORIDE 0.9 % IV SOLN
2.0000 g | Freq: Once | INTRAVENOUS | Status: AC
  Administered 2023-06-24: 2 g via INTRAVENOUS
  Filled 2023-06-24: qty 20

## 2023-06-24 MED ORDER — IPRATROPIUM-ALBUTEROL 0.5-2.5 (3) MG/3ML IN SOLN
3.0000 mL | Freq: Once | RESPIRATORY_TRACT | Status: AC
  Administered 2023-06-24: 3 mL via RESPIRATORY_TRACT
  Filled 2023-06-24: qty 3

## 2023-06-24 MED ORDER — METHYLPREDNISOLONE SODIUM SUCC 125 MG IJ SOLR
125.0000 mg | Freq: Once | INTRAMUSCULAR | Status: AC
  Administered 2023-06-24: 125 mg via INTRAVENOUS
  Filled 2023-06-24: qty 2

## 2023-06-24 MED ORDER — SODIUM CHLORIDE 0.9 % IV SOLN
500.0000 mg | Freq: Once | INTRAVENOUS | Status: AC
  Administered 2023-06-25: 500 mg via INTRAVENOUS
  Filled 2023-06-24: qty 5

## 2023-06-24 MED ORDER — FUROSEMIDE 10 MG/ML IJ SOLN
80.0000 mg | Freq: Once | INTRAMUSCULAR | Status: AC
  Administered 2023-06-24: 80 mg via INTRAVENOUS
  Filled 2023-06-24: qty 8

## 2023-06-24 NOTE — ED Notes (Signed)
MD-Countryman notified of critical trop os 221

## 2023-06-24 NOTE — ED Provider Notes (Signed)
[place] EMERGENCY DEPARTMENT AT [facility] Provider Note   CSN: [id number] Arrival date & time: 06/24/23  2230     History  Chief Complaint  Patient presents with   Shortness of Breath    James [name] is a 77 y.o. male with a PMH of CKD, A-fib on Eliquis, pericardial effusion s/p pericardial window, HTN who presented to the ED for shortness of breath.  Patient reports he started having shortness of breath today and very called 911.  Per EMS, they report when they arrived, the patient was in respiratory distress with an O2 sat of 53% on room air.  They report they placed the patient on a nonrebreather with improvement to the upper 90s.  They state they did not administer him any medications.  Family at bedside reports the patient has had intermittent shortness of breath over the past several days because they were concerned he had a viral infection with some cough and congestion.  They report he wears 2 L O2 as needed at night and is post wear CPAP, but has not been wearing that for the past several months.  Reports he has not missed any doses of his medication including his Lasix.  Shortness of Breath      Home Medications Prior to Admission medications   Medication Sig Start Date End Date Taking? Authorizing Provider  allopurinol (ZYLOPRIM) 100 MG tablet TAKE 2 TABLETS DAILY 03/30/23   [name], [name], MD  amLODipine-atorvastatin (CADUET) 10-40 MG tablet TAKE 1 TABLET DAILY 03/30/23   [name], [name], MD  apixaban (ELIQUIS) 5 MG TABS tablet Take 1 tablet (5 mg total) by mouth 2 (two) times daily. 12/22/22   Eustace Pen, PA-C  aspirin EC 81 MG tablet Take 1 tablet (81 mg total) by mouth daily. Swallow whole. 09/28/22   Barrett, Rae Roam, PA-C  Cholecalciferol (VITAMIN D3) 50 MCG (2000 UT) TABS Take 2,000 Units by mouth daily.    [provider]  cloNIDine (CATAPRES) 0.3 MG tablet Take 0.3 mg by mouth 3 (three) times daily.    [provider]   donepezil (ARICEPT) 5 MG tablet Take 1 tablet (5 mg total) by mouth at bedtime. 05/26/23   [name], [name], MD  doxazosin (CARDURA) 2 MG tablet Take 4 mg by mouth 2 (two) times daily. 08/24/19   [provider]  ferrous sulfate 325 (65 FE) MG tablet Take 325 mg by mouth in the morning and at bedtime.    [provider]  furosemide (LASIX) 80 MG tablet Taking 120 mg by mouth in the am and 80 mg in the evening    [provider]  metoprolol tartrate (LOPRESSOR) 25 MG tablet Take 1 tablet (25 mg total) by mouth 2 (two) times daily. 12/22/22   Eustace Pen, PA-C  potassium chloride (MICRO-K) 10 MEQ CR capsule Take 10 mEq by mouth daily. 12/10/19   [provider]  sertraline (ZOLOFT) 50 MG tablet Take 1 tablet (50 mg total) by mouth daily. 05/26/23   [name], [name], MD  sulfamethoxazole-trimethoprim (BACTRIM DS) 800-160 MG tablet Take 1 tablet by mouth 2 (two) times daily. Patient not taking: Reported on 06/15/2023 04/01/23   [name], [name], MD      Allergies    Plavix [clopidogrel bisulfate] and Uloric [febuxostat]    Review of Systems   Review of Systems  Respiratory:  Positive for shortness of breath.     Physical Exam Updated Vital Signs BP (!) 151/84  Comment: Patient on 15L NRB  Pulse 97 Comment: Patient on 15L NRB  Temp 98 F (36.7 C) (Oral)   Resp (!) 25 Comment: Patient on 15L NRB  SpO2 94% Comment: Patient on 15L NRB Physical Exam Constitutional:      General: He is not in acute distress.    Appearance: He is well-developed. He is not ill-appearing.  HENT:     Head: Normocephalic and atraumatic.     Mouth/Throat:     Mouth: Mucous membranes are moist.     Pharynx: Oropharynx is clear.  Cardiovascular:     Rate and Rhythm: Normal rate and regular rhythm.     Heart sounds: No murmur heard.    No friction rub. No gallop.  Pulmonary:     Effort: Tachypnea present.     Breath sounds: Wheezing and rhonchi present. No rales.      Comments: Increased work of breathing with scattered wheezes and rhonchi bilaterally Abdominal:     Palpations: Abdomen is soft.     Tenderness: There is no abdominal tenderness. There is no guarding or rebound.  Musculoskeletal:     Cervical back: Normal range of motion and neck supple.     Right lower leg: Edema present.     Left lower leg: Edema present.  Skin:    General: Skin is warm and dry.     Capillary Refill: Capillary refill takes less than 2 seconds.  Neurological:     General: No focal deficit present.     Mental Status: He is alert.     ED Results / Procedures / Treatments   Labs (all labs ordered are listed, but only abnormal results are displayed) Labs Reviewed  CBC WITH DIFFERENTIAL/PLATELET - Abnormal; Notable for the following components:      Result Value   WBC 14.9 (*)    RBC 2.53 (*)    Hemoglobin 7.2 (*)    HCT 23.4 (*)    Neutro Abs 13.6 (*)    Lymphs Abs 0.4 (*)    Abs Immature Granulocytes 0.08 (*)    All other components within normal limits  COMPREHENSIVE METABOLIC PANEL - Abnormal; Notable for the following components:   Glucose, Bld 199 (*)    BUN 45 (*)    Creatinine, Ser 2.83 (*)    Calcium 8.1 (*)    Total Protein 5.9 (*)    Albumin 3.1 (*)    Alkaline Phosphatase 138 (*)    GFR, Estimated 22 (*)    All other components within normal limits  BRAIN NATRIURETIC PEPTIDE - Abnormal; Notable for the following components:   B Natriuretic Peptide 1,530.0 (*)    All other components within normal limits  I-STAT VENOUS BLOOD GAS, ED - Abnormal; Notable for the following components:   pO2, Ven 153 (*)    Calcium, Ion 1.06 (*)    HCT 24.0 (*)    Hemoglobin 8.2 (*)    All other components within normal limits  TROPONIN I (HIGH SENSITIVITY) - Abnormal; Notable for the following components:   Troponin I (High Sensitivity) 271 (*)    All other components within normal limits    EKG None  Radiology DG Chest Portable 1 View  Result  Date: 06/24/2023 CLINICAL DATA:  Shortness of breath and hypoxemia. EXAM: PORTABLE CHEST 1 VIEW COMPARISON:  Portable chest 01/18/2023 FINDINGS: The heart is enlarged. There is increased perihilar vascular congestion. There is mild-to-moderate interstitial edema with a basal gradient. Findings consistent with CHF or fluid  overload. There are moderate pleural effusions and overlying patchy atelectasis or consolidation to the mid hilar levels. No focal infiltrate is seen in the upper zones. The mediastinum is stable with aortic atherosclerosis. No new osseous findings. Multilevel thoracic spine bridging enthesopathy. Biapical pleural capping appears similar. IMPRESSION: 1. Cardiomegaly with increased perihilar vascular congestion and mild-to-moderate interstitial edema with a basal gradient. Findings consistent with CHF or fluid overload. 2. Moderate pleural effusions and overlying patchy atelectasis or consolidation. 3. Aortic atherosclerosis. Electronically Signed   By: Almira Bar M.D.   On: 06/24/2023 23:06    Procedures Procedures    Medications Ordered in ED Medications  azithromycin (ZITHROMAX) 500 mg in sodium chloride 0.9 % 250 mL IVPB (has no administration in time range)  cefTRIAXone (ROCEPHIN) 2 g in sodium chloride 0.9 % 100 mL IVPB (2 g Intravenous New Bag/Given 06/24/23 2322)  methylPREDNISolone sodium succinate (SOLU-MEDROL) 125 mg/2 mL injection 125 mg (125 mg Intravenous Given 06/24/23 2244)  ipratropium-albuterol (DUONEB) 0.5-2.5 (3) MG/3ML nebulizer solution 3 mL (3 mLs Nebulization Given 06/24/23 2244)  furosemide (LASIX) injection 80 mg (80 mg Intravenous Given 06/24/23 2334)    ED Course/ Medical Decision Making/ A&P Clinical Course as of 06/24/23 2340  Thu Jun 24, 2023  2246 COPD vs CHF exacerbation [CC]    Clinical Course User Index [CC] Glyn Ade, MD                                 Medical Decision Making Amount and/or Complexity of Data Reviewed Labs:  ordered. Radiology: ordered.  Risk Prescription drug management.   Patient initially hypoxic at 79% on 3 L nasal cannula.  He was placed on nonrebreather and subsequently on BiPAP with improvement of his work of breathing and oxygen saturation.  Chest x-ray concerning for cardiomegaly with pulmonary edema and bilateral effusions.  Patient administered DuoNebs, Solu-Medrol, azithromycin, and Rocephin for COPD exacerbation.  Additionally, he was given Lasix for diuresis.  He was signed out to the oncoming provider in stable condition, admission pending results of workup.        Final Clinical Impression(s) / ED Diagnoses Final diagnoses:  Acute hypoxic respiratory failure (HCC)  Hypervolemia, unspecified hypervolemia type  COPD exacerbation [facility])    Rx / DC Orders ED Discharge Orders     None         Janyth Pupa, MD 06/24/23 2340    Glyn Ade, MD 06/25/23 1505

## 2023-06-24 NOTE — Progress Notes (Signed)
   06/24/23 2241  BiPAP/CPAP/SIPAP  $ Non-Invasive Ventilator  Non-Invasive Vent Set Up  $ Face Mask Medium Yes  BiPAP/CPAP/SIPAP Pt Type Adult  BiPAP/CPAP/SIPAP V60  Mask Type Full face mask  Mask Size Medium  Set Rate 12 breaths/min  Respiratory Rate 24 breaths/min  IPAP 12 cmH20  EPAP 6 cmH2O  FiO2 (%) 70 %  Minute Ventilation 15.8  Leak 22  Peak Inspiratory Pressure (PIP) 16  Tidal Volume (Vt) 485  Patient Home Equipment No  Auto Titrate No   Pt placed on BiPAP 07/01/69% w/o complications per MD. RT will cont to monitor

## 2023-06-24 NOTE — ED Triage Notes (Signed)
Patient BIB [facility] from home, patient wears 2L [place] at night but sat on EMS arrival was 53% on 5L, improved to 98% with NRB en route. Patient skin color and speaking has improved as well. Diminished lower lobes, currently 79% on 3L.

## 2023-06-25 ENCOUNTER — Other Ambulatory Visit: Payer: Self-pay

## 2023-06-25 DIAGNOSIS — J449 Chronic obstructive pulmonary disease, unspecified: Secondary | ICD-10-CM

## 2023-06-25 DIAGNOSIS — J9621 Acute and chronic respiratory failure with hypoxia: Secondary | ICD-10-CM | POA: Diagnosis present

## 2023-06-25 DIAGNOSIS — N184 Chronic kidney disease, stage 4 (severe): Secondary | ICD-10-CM

## 2023-06-25 DIAGNOSIS — E785 Hyperlipidemia, unspecified: Secondary | ICD-10-CM

## 2023-06-25 DIAGNOSIS — I10 Essential (primary) hypertension: Secondary | ICD-10-CM

## 2023-06-25 DIAGNOSIS — D649 Anemia, unspecified: Secondary | ICD-10-CM

## 2023-06-25 DIAGNOSIS — Z8249 Family history of ischemic heart disease and other diseases of the circulatory system: Secondary | ICD-10-CM | POA: Diagnosis not present

## 2023-06-25 DIAGNOSIS — R0602 Shortness of breath: Secondary | ICD-10-CM | POA: Diagnosis present

## 2023-06-25 DIAGNOSIS — I480 Paroxysmal atrial fibrillation: Secondary | ICD-10-CM

## 2023-06-25 DIAGNOSIS — I771 Stricture of artery: Secondary | ICD-10-CM | POA: Diagnosis not present

## 2023-06-25 DIAGNOSIS — I4819 Other persistent atrial fibrillation: Secondary | ICD-10-CM | POA: Diagnosis present

## 2023-06-25 DIAGNOSIS — N179 Acute kidney failure, unspecified: Secondary | ICD-10-CM | POA: Diagnosis present

## 2023-06-25 DIAGNOSIS — D509 Iron deficiency anemia, unspecified: Secondary | ICD-10-CM | POA: Diagnosis present

## 2023-06-25 DIAGNOSIS — Z794 Long term (current) use of insulin: Secondary | ICD-10-CM | POA: Diagnosis not present

## 2023-06-25 DIAGNOSIS — Z7901 Long term (current) use of anticoagulants: Secondary | ICD-10-CM | POA: Diagnosis not present

## 2023-06-25 DIAGNOSIS — J42 Unspecified chronic bronchitis: Secondary | ICD-10-CM | POA: Diagnosis not present

## 2023-06-25 DIAGNOSIS — E1165 Type 2 diabetes mellitus with hyperglycemia: Secondary | ICD-10-CM | POA: Diagnosis present

## 2023-06-25 DIAGNOSIS — J929 Pleural plaque without asbestos: Secondary | ICD-10-CM | POA: Diagnosis not present

## 2023-06-25 DIAGNOSIS — I5033 Acute on chronic diastolic (congestive) heart failure: Secondary | ICD-10-CM | POA: Diagnosis present

## 2023-06-25 DIAGNOSIS — Z9861 Coronary angioplasty status: Secondary | ICD-10-CM | POA: Diagnosis not present

## 2023-06-25 DIAGNOSIS — Z8711 Personal history of peptic ulcer disease: Secondary | ICD-10-CM | POA: Diagnosis not present

## 2023-06-25 DIAGNOSIS — E1122 Type 2 diabetes mellitus with diabetic chronic kidney disease: Secondary | ICD-10-CM | POA: Diagnosis present

## 2023-06-25 DIAGNOSIS — I130 Hypertensive heart and chronic kidney disease with heart failure and stage 1 through stage 4 chronic kidney disease, or unspecified chronic kidney disease: Secondary | ICD-10-CM | POA: Diagnosis present

## 2023-06-25 DIAGNOSIS — E876 Hypokalemia: Secondary | ICD-10-CM | POA: Diagnosis present

## 2023-06-25 DIAGNOSIS — Z79899 Other long term (current) drug therapy: Secondary | ICD-10-CM | POA: Diagnosis not present

## 2023-06-25 DIAGNOSIS — Z7982 Long term (current) use of aspirin: Secondary | ICD-10-CM | POA: Diagnosis not present

## 2023-06-25 DIAGNOSIS — Z87891 Personal history of nicotine dependence: Secondary | ICD-10-CM | POA: Diagnosis not present

## 2023-06-25 DIAGNOSIS — I509 Heart failure, unspecified: Secondary | ICD-10-CM

## 2023-06-25 DIAGNOSIS — K219 Gastro-esophageal reflux disease without esophagitis: Secondary | ICD-10-CM | POA: Diagnosis present

## 2023-06-25 DIAGNOSIS — Z833 Family history of diabetes mellitus: Secondary | ICD-10-CM | POA: Diagnosis not present

## 2023-06-25 DIAGNOSIS — I701 Atherosclerosis of renal artery: Secondary | ICD-10-CM | POA: Diagnosis present

## 2023-06-25 DIAGNOSIS — F0390 Unspecified dementia without behavioral disturbance: Secondary | ICD-10-CM | POA: Diagnosis present

## 2023-06-25 DIAGNOSIS — J441 Chronic obstructive pulmonary disease with (acute) exacerbation: Secondary | ICD-10-CM | POA: Diagnosis present

## 2023-06-25 DIAGNOSIS — F0393 Unspecified dementia, unspecified severity, with mood disturbance: Secondary | ICD-10-CM | POA: Diagnosis not present

## 2023-06-25 LAB — BASIC METABOLIC PANEL
Anion gap: 13 (ref 5–15)
BUN: 45 mg/dL — ABNORMAL HIGH (ref 8–23)
CO2: 22 mmol/L (ref 22–32)
Calcium: 8.2 mg/dL — ABNORMAL LOW (ref 8.9–10.3)
Chloride: 104 mmol/L (ref 98–111)
Creatinine, Ser: 2.79 mg/dL — ABNORMAL HIGH (ref 0.61–1.24)
GFR, Estimated: 23 mL/min — ABNORMAL LOW (ref >60)
Glucose, Bld: 182 mg/dL — ABNORMAL HIGH (ref 70–99)
Potassium: 3.6 mmol/L (ref 3.5–5.1)
Sodium: 139 mmol/L (ref 135–145)

## 2023-06-25 LAB — MAGNESIUM: Magnesium: 2.1 mg/dL (ref 1.7–2.4)

## 2023-06-25 LAB — GLUCOSE, CAPILLARY
Glucose-Capillary: 174 mg/dL — ABNORMAL HIGH (ref 70–99)
Glucose-Capillary: 180 mg/dL — ABNORMAL HIGH (ref 70–99)
Glucose-Capillary: 143 mg/dL — ABNORMAL HIGH (ref 70–99)
Glucose-Capillary: 159 mg/dL — ABNORMAL HIGH (ref 70–99)
Glucose-Capillary: 165 mg/dL — ABNORMAL HIGH (ref 70–99)
Glucose-Capillary: 139 mg/dL — ABNORMAL HIGH (ref 70–99)

## 2023-06-25 LAB — TROPONIN I (HIGH SENSITIVITY): Troponin I (High Sensitivity): 247 ng/L (ref <18)

## 2023-06-25 LAB — PREPARE RBC (CROSSMATCH)

## 2023-06-25 LAB — CBC
HCT: 21.6 % — ABNORMAL LOW (ref 39.0–52.0)
Hemoglobin: 6.7 g/dL — CL (ref 13.0–17.0)
MCH: 28 pg (ref 26.0–34.0)
MCHC: 31 g/dL (ref 30.0–36.0)
MCV: 90.4 fL (ref 80.0–100.0)
Platelets: 214 10*3/uL (ref 150–400)
RBC: 2.39 MIL/uL — ABNORMAL LOW (ref 4.22–5.81)
RDW: 13.8 % (ref 11.5–15.5)
WBC: 4.7 10*3/uL (ref 4.0–10.5)
nRBC: 0 % (ref 0.0–0.2)

## 2023-06-25 LAB — HEMOGLOBIN AND HEMATOCRIT, BLOOD
HCT: 25.1 % — ABNORMAL LOW (ref 39.0–52.0)
Hemoglobin: 7.9 g/dL — ABNORMAL LOW (ref 13.0–17.0)

## 2023-06-25 LAB — PHOSPHORUS: Phosphorus: 4.9 mg/dL — ABNORMAL HIGH (ref 2.5–4.6)

## 2023-06-25 MED ORDER — DOXAZOSIN MESYLATE 4 MG PO TABS
4.0000 mg | ORAL_TABLET | Freq: Two times a day (BID) | ORAL | Status: DC
  Administered 2023-06-25 – 2023-06-28 (×6): 4 mg via ORAL
  Filled 2023-06-25 (×7): qty 1

## 2023-06-25 MED ORDER — POLYETHYLENE GLYCOL 3350 17 G PO PACK: 17.0000 g | PACK | Freq: Every day | ORAL | Status: DC | PRN

## 2023-06-25 MED ORDER — CLONIDINE HCL 0.2 MG PO TABS
0.2000 mg | ORAL_TABLET | Freq: Three times a day (TID) | ORAL | Status: DC
  Administered 2023-06-25 – 2023-06-28 (×9): 0.2 mg via ORAL
  Filled 2023-06-25 (×9): qty 1

## 2023-06-25 MED ORDER — SODIUM CHLORIDE 0.9% IV SOLUTION: Freq: Once | INTRAVENOUS | Status: AC

## 2023-06-25 MED ORDER — METOPROLOL TARTRATE 12.5 MG HALF TABLET
12.5000 mg | ORAL_TABLET | Freq: Two times a day (BID) | ORAL | Status: DC
  Administered 2023-06-25: 12.5 mg via ORAL
  Filled 2023-06-25: qty 1

## 2023-06-25 MED ORDER — ASPIRIN 81 MG PO TBEC
81.0000 mg | DELAYED_RELEASE_TABLET | Freq: Every day | ORAL | Status: DC
  Administered 2023-06-25 – 2023-06-26 (×2): 81 mg via ORAL
  Filled 2023-06-25 (×2): qty 1

## 2023-06-25 MED ORDER — INSULIN ASPART 100 UNIT/ML IJ SOLN
0.0000 [IU] | INTRAMUSCULAR | Status: DC
  Administered 2023-06-25 (×2): 2 [IU] via SUBCUTANEOUS
  Administered 2023-06-25: 1 [IU] via SUBCUTANEOUS

## 2023-06-25 MED ORDER — INSULIN ASPART 100 UNIT/ML IJ SOLN
0.0000 [IU] | Freq: Three times a day (TID) | INTRAMUSCULAR | Status: DC
  Administered 2023-06-25: 2 [IU] via SUBCUTANEOUS
  Administered 2023-06-26 (×2): 1 [IU] via SUBCUTANEOUS

## 2023-06-25 MED ORDER — METOPROLOL TARTRATE 5 MG/5ML IV SOLN: 5.0000 mg | Freq: Once | INTRAVENOUS | Status: DC

## 2023-06-25 MED ORDER — MELATONIN 5 MG PO TABS: 5.0000 mg | ORAL_TABLET | Freq: Every evening | ORAL | Status: DC | PRN

## 2023-06-25 MED ORDER — ALLOPURINOL 100 MG PO TABS
200.0000 mg | ORAL_TABLET | Freq: Every day | ORAL | Status: DC
  Administered 2023-06-25 – 2023-06-28 (×4): 200 mg via ORAL
  Filled 2023-06-25 (×4): qty 2

## 2023-06-25 MED ORDER — SERTRALINE HCL 50 MG PO TABS
50.0000 mg | ORAL_TABLET | Freq: Every day | ORAL | Status: DC
  Administered 2023-06-25 – 2023-06-28 (×4): 50 mg via ORAL
  Filled 2023-06-25 (×4): qty 1

## 2023-06-25 MED ORDER — DONEPEZIL HCL 5 MG PO TABS
5.0000 mg | ORAL_TABLET | Freq: Every day | ORAL | Status: DC
  Administered 2023-06-25 – 2023-06-27 (×3): 5 mg via ORAL
  Filled 2023-06-25 (×3): qty 1

## 2023-06-25 MED ORDER — CLONIDINE HCL 0.2 MG PO TABS: 0.3000 mg | ORAL_TABLET | Freq: Three times a day (TID) | ORAL | Status: DC

## 2023-06-25 MED ORDER — ACETAMINOPHEN 325 MG PO TABS: 650.0000 mg | ORAL_TABLET | Freq: Four times a day (QID) | ORAL | Status: DC | PRN

## 2023-06-25 MED ORDER — METOPROLOL TARTRATE 25 MG PO TABS
25.0000 mg | ORAL_TABLET | Freq: Two times a day (BID) | ORAL | Status: DC
  Administered 2023-06-25 – 2023-06-27 (×4): 25 mg via ORAL
  Filled 2023-06-25 (×4): qty 1

## 2023-06-25 MED ORDER — FERROUS SULFATE 325 (65 FE) MG PO TABS
325.0000 mg | ORAL_TABLET | Freq: Every day | ORAL | Status: DC
  Administered 2023-06-26 – 2023-06-27 (×2): 325 mg via ORAL
  Filled 2023-06-25 (×2): qty 1

## 2023-06-25 MED ORDER — APIXABAN 5 MG PO TABS
5.0000 mg | ORAL_TABLET | Freq: Two times a day (BID) | ORAL | Status: DC
  Administered 2023-06-25 – 2023-06-28 (×7): 5 mg via ORAL
  Filled 2023-06-25 (×7): qty 1

## 2023-06-25 MED ORDER — PROCHLORPERAZINE EDISYLATE 10 MG/2ML IJ SOLN: 5.0000 mg | Freq: Four times a day (QID) | INTRAMUSCULAR | Status: DC | PRN

## 2023-06-25 MED ORDER — FUROSEMIDE 10 MG/ML IJ SOLN
80.0000 mg | Freq: Two times a day (BID) | INTRAMUSCULAR | Status: DC
  Administered 2023-06-25 – 2023-06-28 (×6): 80 mg via INTRAVENOUS
  Filled 2023-06-25 (×6): qty 8

## 2023-06-25 NOTE — Hospital Course (Addendum)
Mr. James [name] was admitted to the hospital with the working diagnosis of heart failure decompensation.   77 yo male with the past medical history of heart failure, chronic hypoxia, hypertension, atrial fibrillation, CKD stage IV and hyperlipidemia who presented with dyspnea. At home patient developed respiratory distress, EMS was his 02 saturation was found 53% on 5 L/min per nasal cannula of supplemental 02, he was placed on non re-breather mask and was transported to the ED.  On his initial physical examination his blood pressure was 151/81, HR 101, RR 25 and 02 saturation 94%, on supplemental 02 per [place], heart with S1 and S2 present and regular with no gallops or rubs, positive JVD, lungs with diffuse rales bilaterally, abdomen with no distention and positive +++ lower extremity edema.   Chest radiograph with hyperinflation, with bilateral hilar vascular congestion, cephalization of the vasculature and bilateral central interstitial infiltrates, bilateral moderate pleural effusions.   EKG 102 bpm, left axis deviation, qtc 545, sinus rhythm with poor R R wave progression, with no significant ST segment changes and negative T wave V5 and V6.   Patient was placed on IV furosemide with improvement in his symptoms.  Weaned off bipap to nasal cannula.  11/29 hgb 6.7. transfused one unit PRBC.   11/30 using intermittent Bipap for dyspnea and increased work of breathing.  12/01 improving volume status and symptoms. Continue to have not controlled atrial fibrillation.

## 2023-06-25 NOTE — Progress Notes (Signed)
Pt transported to 6E21 on BiPAP w/o complications.

## 2023-06-25 NOTE — Assessment & Plan Note (Signed)
No signs of exacerbation  ?Continue with bronchodilator therapy.  ?

## 2023-06-25 NOTE — ED Notes (Signed)
ED TO INPATIENT HANDOFF REPORT  ED Nurse Name and Phone #: Trinna Post RN, [phone]  S Name/Age/Gender James [name] Link 77 y.o. male Room/Bed: 024C/024C  Code Status   Code Status: Full Code  Home/SNF/Other Home Patient oriented to: self, place, time, and situation Is this baseline? Yes   Triage Complete: Triage complete  Chief Complaint Acute on chronic hypoxic respiratory failure (HCC) [J96.21]  Triage Note Patient BIB [facility] from home, patient wears 2L [place] at night but sat on EMS arrival was 53% on 5L, improved to 98% with NRB en route. Patient skin color and speaking has improved as well. Diminished lower lobes, currently 79% on 3L.    Allergies Allergies  Allergen Reactions   Plavix [Clopidogrel Bisulfate] Other (See Comments)    GI Bleed   Uloric [Febuxostat] Other (See Comments)    Unknown reaction    Level of Care/Admitting Diagnosis ED Disposition     ED Disposition  Admit   Condition  --   Comment  Hospital Area: [facility] [[id number]]  Level of Care: Progressive [102]  Admit to Progressive based on following criteria: CARDIOVASCULAR & THORACIC of moderate stability with acute coronary syndrome symptoms/low risk myocardial infarction/hypertensive urgency/arrhythmias/heart failure potentially compromising stability and stable post cardiovascular intervention patients.  Admit to Progressive based on following criteria: RESPIRATORY PROBLEMS hypoxemic/hypercapnic respiratory failure that is responsive to NIPPV (BiPAP) or High Flow Nasal Cannula (6-80 lpm). Frequent assessment/intervention, no > Q2 hrs < Q4 hrs, to maintain oxygenation and pulmonary hygiene.  May admit patient to [facility] or [facility] if equivalent level of care is available:: Yes  Covid Evaluation: Asymptomatic - no recent exposure (last 10 days) testing not required  Diagnosis: Acute on chronic hypoxic respiratory failure [facility]) [[id number]]  Admitting Physician: Darlin Drop  [[id number]]  Attending Physician: Darlin Drop [[id number]]  Certification:: I certify this patient will need inpatient services for at least 2 midnights  Expected Medical Readiness: 06/27/2023          B Medical/Surgery History Past Medical History:  Diagnosis Date   Arthritis    Carotid artery occlusion    Chronic kidney disease    DVT (deep venous thrombosis) (HCC) 2008   GERD (gastroesophageal reflux disease)    takes Protonix daily   Gout    takes Allopurinol daily and Indomethacin prn   H/O renal artery stenosis    H/O: GI bleed    History of blood transfusion    History of colon polyps    History of gastric ulcer    Hyperlipidemia    takes Lipitor daily   Hypertension    takes Metoprolol and Amlodipine daily   Joint pain    Joint swelling    Sleep apnea    Past Surgical History:  Procedure Laterality Date   CARDIOVERSION N/A 11/18/2022   Procedure: CARDIOVERSION;  Surgeon: Chilton Si, MD;  [facility]: [facility] INVASIVE CV LAB;  Service: Cardiovascular;  Laterality: N/A;   CAROTID ENDARTERECTOMY  2008   right CEA   CHOLECYSTECTOMY  mid 90's   COLON SURGERY     COLONOSCOPY     ENDARTERECTOMY  06/02/2012   Procedure: ENDARTERECTOMY CAROTID;  Surgeon: Nada Libman, MD;  Location: [facility] OR;  Service: Vascular;  Laterality: Left;   ESOPHAGOGASTRODUODENOSCOPY     HERNIA REPAIR     umbilical hernia   PATCH ANGIOPLASTY  06/02/2012   Procedure: PATCH ANGIOPLASTY;  Surgeon: Nada Libman, MD;  Location: [facility] OR;  Service: Vascular;  Laterality: Left;  using Vascu-Guard Patch   PERIPHERAL VASCULAR CATHETERIZATION Right 12/03/2015   Procedure: Peripheral Vascular Balloon Angioplasty;  Surgeon: Nada Libman, MD;  Location: [facility] INVASIVE CV LAB;  Service: Cardiovascular;  Laterality: Right;  RENAL unsuccessful   RENAL ARTERY STENT  2009   Right renal artery stenting-  Right Kidney   small intestine removed  2001   d/t random gi bleeding   SUBXYPHOID PERICARDIAL WINDOW N/A  09/23/2022   Procedure: SUBXYPHOID PERICARDIAL WINDOW;  Surgeon: Loreli Slot, MD;  Location: [facility] OR;  Service: Thoracic;  Laterality: N/A;   TEE WITHOUT CARDIOVERSION N/A 09/23/2022   Procedure: TRANSESOPHAGEAL ECHOCARDIOGRAM;  Surgeon: Loreli Slot, MD;  Location: [facility] OR;  Service: Thoracic;  Laterality: N/A;     A IV Location/Drains/Wounds Patient Lines/Drains/Airways Status     Active Line/Drains/Airways     Name Placement date Placement time Site Days   Peripheral IV 06/24/23 20 G Right Antecubital 06/24/23  2244  Antecubital  1   Peripheral IV 06/24/23 20 G Anterior;Right Forearm 06/24/23  2245  Forearm  1            Intake/Output Last 24 hours  Intake/Output Summary (Last 24 hours) at 06/25/2023 0109 Last data filed at 06/25/2023 0032 Gross per 24 hour  Intake 100.64 ml  Output --  Net 100.64 ml    Labs/Imaging Results for orders placed or performed during the hospital encounter of 06/24/23 (from the past 48 hour(s))  CBC with Differential     Status: Abnormal   Collection Time: 06/24/23 10:41 PM  Result Value Ref Range   WBC 14.9 (H) 4.0 - 10.5 K/uL   RBC 2.53 (L) 4.22 - 5.81 MIL/uL   Hemoglobin 7.2 (L) 13.0 - 17.0 g/dL   HCT 14.7 (L) 82.9 - 56.2 %   MCV 92.5 80.0 - 100.0 fL   MCH 28.5 26.0 - 34.0 pg   MCHC 30.8 30.0 - 36.0 g/dL   RDW 13.0 86.5 - 78.4 %   Platelets 273 150 - 400 K/uL   nRBC 0.0 0.0 - 0.2 %   Neutrophils Relative % 91 %   Neutro Abs 13.6 (H) 1.7 - 7.7 K/uL   Lymphocytes Relative 3 %   Lymphs Abs 0.4 (L) 0.7 - 4.0 K/uL   Monocytes Relative 4 %   Monocytes Absolute 0.5 0.1 - 1.0 K/uL   Eosinophils Relative 1 %   Eosinophils Absolute 0.2 0.0 - 0.5 K/uL   Basophils Relative 0 %   Basophils Absolute 0.0 0.0 - 0.1 K/uL   Immature Granulocytes 1 %   Abs Immature Granulocytes 0.08 (H) 0.00 - 0.07 K/uL    Comment: Performed at [facility], [address], [place], [place] [address]  Comprehensive metabolic panel      Status: Abnormal   Collection Time: 06/24/23 10:41 PM  Result Value Ref Range   Sodium 135 135 - 145 mmol/L   Potassium 3.7 3.5 - 5.1 mmol/L    Comment: HEMOLYSIS AT THIS LEVEL MAY AFFECT RESULT   Chloride 101 98 - 111 mmol/L   CO2 24 22 - 32 mmol/L   Glucose, Bld 199 (H) 70 - 99 mg/dL    Comment: Glucose reference range applies only to samples taken after fasting for at least 8 hours.   BUN 45 (H) 8 - 23 mg/dL   Creatinine, Ser 5.28 (H) 0.61 - 1.24 mg/dL   Calcium 8.1 (L) 8.9 - 10.3 mg/dL   Total Protein 5.9 (L)  6.5 - 8.1 g/dL   Albumin 3.1 (L) 3.5 - 5.0 g/dL   AST 31 15 - 41 U/L    Comment: HEMOLYSIS AT THIS LEVEL MAY AFFECT RESULT   ALT 18 0 - 44 U/L    Comment: HEMOLYSIS AT THIS LEVEL MAY AFFECT RESULT   Alkaline Phosphatase 138 (H) 38 - 126 U/L   Total Bilirubin 1.1 <1.2 mg/dL    Comment: HEMOLYSIS AT THIS LEVEL MAY AFFECT RESULT   GFR, Estimated 22 (L) >60 mL/min    Comment: (NOTE) Calculated using the CKD-EPI Creatinine Equation (2021)    Anion gap 10 5 - 15    Comment: Performed at [facility], [address], [place], [place] [address]  Troponin I (High Sensitivity)     Status: Abnormal   Collection Time: 06/24/23 10:41 PM  Result Value Ref Range   Troponin I (High Sensitivity) 271 (HH) <18 ng/L    Comment: CRITICAL RESULT CALLED TO, READ BACK BY AND VERIFIED WITH Jacobi Nile A, RN 2335 06/24/2023 SANDOVAL K (NOTE) Elevated high sensitivity troponin I (hsTnI) values and significant  changes across serial measurements may suggest ACS but many other  chronic and acute conditions are known to elevate hsTnI results.  Refer to the "Links" section for chest pain algorithms and additional  guidance. Performed at [facility], [address] [place], [place] [address]   Brain natriuretic peptide     Status: Abnormal   Collection Time: 06/24/23 10:41 PM  Result Value Ref Range   B Natriuretic Peptide 1,530.0 (H) 0.0 - 100.0 pg/mL    Comment: Performed at [facility], [address], [place], [place] [address]  I-Stat venous blood gas, [facility] ED, [facility], [facility])     Status: Abnormal   Collection Time: 06/24/23 10:54 PM  Result Value Ref Range   pH, Ven 7.398 7.25 - 7.43   pCO2, Ven 44.3 44 - 60 mmHg   pO2, Ven 153 (H) 32 - 45 mmHg   Bicarbonate 27.3 20.0 - 28.0 mmol/L   TCO2 29 22 - 32 mmol/L   O2 Saturation 99 %   Acid-Base Excess 2.0 0.0 - 2.0 mmol/L   Sodium 137 135 - 145 mmol/L   Potassium 3.8 3.5 - 5.1 mmol/L   Calcium, Ion 1.06 (L) 1.15 - 1.40 mmol/L   HCT 24.0 (L) 39.0 - 52.0 %   Hemoglobin 8.2 (L) 13.0 - 17.0 g/dL   Sample type VENOUS    DG Chest Portable 1 View  Result Date: 06/24/2023 CLINICAL DATA:  Shortness of breath and hypoxemia. EXAM: PORTABLE CHEST 1 VIEW COMPARISON:  Portable chest 01/18/2023 FINDINGS: The heart is enlarged. There is increased perihilar vascular congestion. There is mild-to-moderate interstitial edema with a basal gradient. Findings consistent with CHF or fluid overload. There are moderate pleural effusions and overlying patchy atelectasis or consolidation to the mid hilar levels. No focal infiltrate is seen in the upper zones. The mediastinum is stable with aortic atherosclerosis. No new osseous findings. Multilevel thoracic spine bridging enthesopathy. Biapical pleural capping appears similar. IMPRESSION: 1. Cardiomegaly with increased perihilar vascular congestion and mild-to-moderate interstitial edema with a basal gradient. Findings consistent with CHF or fluid overload. 2. Moderate pleural effusions and overlying patchy atelectasis or consolidation. 3. Aortic atherosclerosis. Electronically Signed   By: Almira Bar M.D.   On: 06/24/2023 23:06    Pending Labs Unresulted Labs (From admission, onward)    None       Vitals/Pain Today's Vitals  06/24/23 2315 06/24/23 2345 06/25/23 0000 06/25/23 0015  BP: 138/69 139/61 137/83 129/69  Pulse: 89 (!) 102 90 93  Resp: 16 18 (!) 22 18  Temp:       TempSrc:      SpO2: 99% 100% 98% 98%    Isolation Precautions No active isolations  Medications Medications  azithromycin (ZITHROMAX) 500 mg in sodium chloride 0.9 % 250 mL IVPB (500 mg Intravenous New Bag/Given 06/25/23 0035)  acetaminophen (TYLENOL) tablet 650 mg (has no administration in time range)  prochlorperazine (COMPAZINE) injection 5 mg (has no administration in time range)  polyethylene glycol (MIRALAX / GLYCOLAX) packet 17 g (has no administration in time range)  melatonin tablet 5 mg (has no administration in time range)  methylPREDNISolone sodium succinate (SOLU-MEDROL) 125 mg/2 mL injection 125 mg (125 mg Intravenous Given 06/24/23 2244)  ipratropium-albuterol (DUONEB) 0.5-2.5 (3) MG/3ML nebulizer solution 3 mL (3 mLs Nebulization Given 06/24/23 2244)  cefTRIAXone (ROCEPHIN) 2 g in sodium chloride 0.9 % 100 mL IVPB (0 g Intravenous Stopped 06/24/23 2354)  furosemide (LASIX) injection 80 mg (80 mg Intravenous Given 06/24/23 2334)    Mobility walks with device     Focused Assessments     R Recommendations: See Admitting Provider Note  Report given to:   Additional Notes:

## 2023-06-25 NOTE — Assessment & Plan Note (Addendum)
Echocardiogram with preserved LV systolic function with EF 60 to 65%, mild LVH, RV systolic function not well visualized. RVSP 26.8 mmHg. LA with moderate dilatation, RA mild dilatation, small pericardial effusion, no significant valvular disease.   Urine output 676 ml, but his weight is down 4 Kg from yesterday.    Plan to continue diuresis with furosemide IV 80 mg IV q 12 hrs.  Blood pressure control with clonidine.   Acute on chronic hypoxemic respiratory failure due to acute cardiogenic pulmonary edema.  This am 02 saturation 97% on 3 L/min per [place] Using intermittent Bipap for dyspnea.   Follow up chest radiograph today bilateral hilar vascular congestion with bilateral pleural effusions, more right than left.  If persistent effusion despite diuresis will consider thoracentesis.

## 2023-06-25 NOTE — Progress Notes (Addendum)
Progress Note   Patient: James [name] JXB:[id number] DOB: 13-Dec-1945 DOA: 06/24/2023     0 DOS: the patient was seen and examined on 06/25/2023   Brief hospital course: Mr. James [name] was admitted to the hospital with the working diagnosis of heart failure decompensation.   77 yo male with the past medical history of heart failure, chronic hypoxia, hypertension, atrial fibrillation, CKD stage IV and hyperlipidemia who presented with dyspnea. At home patient developed respiratory distress, EMS was his 02 saturation was found 53% on 5 L/min per nasal cannula of supplemental 02, he was placed on non re-breather mask and was transported to the ED.  On his initial physical examination his blood pressure was 151/81, HR 101, RR 25 and 02 saturation 94%, on supplemental 02 per James [name], heart with S1 and S2 present and regular with no gallops or rubs, positive JVD, lungs with diffuse rales bilaterally, abdomen with no distention and positive +++ lower extremity edema.   Chest radiograph with hyperinflation, with bilateral hilar vascular congestion, cephalization of the vasculature and bilateral central interstitial infiltrates, bilateral moderate pleural effusions.   EKG 102 bpm, left axis deviation, qtc 545, sinus rhythm with poor R R wave progression, with no significant ST segment changes and negative T wave V5 and V6.   Patient was placed on IV furosemide with improvement in his symptoms.  Weaned off bipap to nasal cannula.  11/29 hgb 6.7. transfused one unit PRBC.      Assessment and Plan: * Acute on chronic diastolic CHF (congestive heart failure) (HCC) Echocardiogram with preserved LV systolic function with EF 60 to 65%, mild LVH, RV systolic function not well visualized. RVSP 26.8 mmHg. LA with moderate dilatation, RA mild dilatation, small pericardial effusion, no significant valvular disease.   Patient with improvement in volume status.   Plan to continue diuresis with furosemide IV  80 mg IV q 12 hrs.  Blood pressure control with clonidine.   Acute on chronic hypoxemic respiratory failure due to acute cardiogenic pulmonary edema.  This am 02 saturation 95% on 3 L/min per [place] Plan to continue diuresis with furosemide.  Continue oxymetry monitoring and supplemental 02 to keep 02 saturation 88% or greater.   COPD (chronic obstructive pulmonary disease) (HCC) No signs of exacerbation. Continue with bronchodilator therapy.   Hypertension Resume blood pressure control with clonidine (will reduced dose to 0,2 mg tid) and continue metoprolol   CKD (chronic kidney disease) stage 4, GFR 15-29 ml/min (HCC) Renal function with serum cr at 2,79 with K at 3,6 and serum bicarbonate at 22.  Na 139 and P 4.9 with Mg 2.1   Plan to continue diuresis with IV furosemide Follow up renal function and electrolytes in am.   Paroxysmal atrial fibrillation (HCC) Continue rate control with metoprolol Anticoagulation with apixaban.   Hyperlipidemia LDL goal <100 Continue statin therapy   Symptomatic anemia Follow up hgb less than 7.,  Plan to transfuse one unit PRBC and follow up response.  Check iron stores.   Dementia (HCC) Continue with donepezil and sertraline.         Subjective: Patient with improvement in dyspnea, no chest pain, persistent lower extremity edema   Physical Exam: Vitals:   06/25/23 0937 06/25/23 1205 06/25/23 1502 06/25/23 1545  BP: 132/65 (!) 143/77 (!) 141/77 (!) 152/73  Pulse: 97  (!) 118 97  Resp:   (!) 25 (!) 25  Temp:  98.3 F (36.8 C) 98 F (36.7 C) 98.1 F (36.7 C)  TempSrc:  Oral Oral Oral  SpO2:   97% 95%  Weight:      Height:       Neurology awake and alert ENT with mild pallor Cardiovascular with S1 and S2 present and regular with no gallops, rubs or murmurs Mild JVD Positive lower extremity edema Respiratory with rales at bases bilaterally with no rhonchi Abdomen with no distention   Data Reviewed:    Family  Communication: no family at the bedside   Disposition: Status is: Inpatient Remains inpatient appropriate because: diuresis and PRBC transfusion   Planned Discharge Destination: Home     Author: Coralie Keens, MD 06/25/2023 5:19 PM  For on call review [email or web address].

## 2023-06-25 NOTE — Assessment & Plan Note (Signed)
Positive iron deficiency with serum iron 17, TIBC 360, transferrin saturation 5 and ferritin 30 Sp one unit PRBC transfusion  Follow up hgb is 8.2  Will add IV iron today.  Continue anticoagulation with apixaban, since there is no signs of acute bleeding.  Hold on aspirin.

## 2023-06-25 NOTE — ED Provider Notes (Signed)
Patient brought in in respiratory distress.  Was initially on nonrebreather mask and switched to BiPAP.  Seems to be improving on the BiPAP.  I consulted with Dr. Margo Aye, who is appreciated for admitting the patient.   Roxy Horseman, PA-C 06/25/23 0042    Gilda Crease, MD 06/25/23 [id number]

## 2023-06-25 NOTE — Assessment & Plan Note (Signed)
Continue rate control with metoprolol Anticoagulation with apixaban.

## 2023-06-25 NOTE — Progress Notes (Signed)
Pt converted to Afib RVR with rates 120-140s. EKG done. MD Arrien notified. Metoprolol increased to 25mg  and a dose given now.

## 2023-06-25 NOTE — Assessment & Plan Note (Signed)
Continue statin therapy.

## 2023-06-25 NOTE — Assessment & Plan Note (Signed)
Resume blood pressure control with clonidine (will reduced dose to 0,2 mg tid) and continue metoprolol

## 2023-06-25 NOTE — Assessment & Plan Note (Signed)
Renal function with serum cr at 2,79 with K at 3,6 and serum bicarbonate at 22.  Na 139 and P 4.9 with Mg 2.1   Plan to continue diuresis with IV furosemide Follow up renal function and electrolytes in am.

## 2023-06-25 NOTE — Progress Notes (Addendum)
Date and time results received: 06/25/23 0900 (use smartphrase ".now" to insert current time)  Test: Hgb Critical Value: 6.7  Name of Provider Notified: Dr Ella Jubilee paged  Orders Received? Or Actions Taken?:  MD to come bedside . 1U PRBC ordered.

## 2023-06-25 NOTE — Progress Notes (Signed)
   06/25/23 1502  Assess: MEWS Score  Temp 98 F (36.7 C)  BP (!) 141/77  MAP (mmHg) 95  Pulse Rate (!) 118  ECG Heart Rate (!) 103  Resp (!) 25  SpO2 97 %  O2 Device Nasal Cannula  O2 Flow Rate (L/min) 2 L/min  Assess: MEWS Score  MEWS Temp 0  MEWS Systolic 0  MEWS Pulse 1  MEWS RR 1  MEWS LOC 0  MEWS Score 2  MEWS Score Color Yellow  Assess: if the MEWS score is Yellow or Red  Were vital signs accurate and taken at a resting state? Yes  Does the patient meet 2 or more of the SIRS criteria? Yes  Does the patient have a confirmed or suspected source of infection? No  MEWS guidelines implemented  Yes, yellow  Treat  MEWS Interventions Considered administering scheduled or prn medications/treatments as ordered  Take Vital Signs  Increase Vital Sign Frequency  Yellow: Q2hr x1, continue Q4hrs until patient remains green for 12hrs  Escalate  MEWS: Escalate Yellow: Discuss with charge nurse and consider notifying provider and/or RRT  Notify: Charge Nurse/RN  Name of Charge Nurse/RN Notified Hinton Dyer, RN  Assess: SIRS CRITERIA  SIRS Temperature  0  SIRS Pulse 1  SIRS Respirations  1  SIRS WBC 0  SIRS Score Sum  2

## 2023-06-25 NOTE — H&P (Addendum)
History and Physical  James [name] ZOX:[id number] DOB: [birth year] DOA: 06/24/2023  Referring physician: Roxy Horseman, PA-EDP  PCP: [name], [name], MD  Outpatient Specialists: Cardiology. Patient coming from: Home.  Chief Complaint: Shortness of breath.  HPI: James [name] is a 77 y.o. male with medical history significant for chronic HFpEF, chronic hypoxic respiratory failure on 2 L nasal cannula, hypertension, hyperlipidemia, renal artery stenosis status post arterial stent, CKD stage IV, persistent atrial fibrillation on Eliquis, presented to the ED due to shortness of breath and acute respiratory distress.  Upon EMS arrival the patient was noted to be severely hypoxic with O2 saturation in the 50s on room air.  In the ED, noted to be volume overloaded on exam with 3+ pitting edema in lower extremities bilaterally, diffuse rales on lungs auscultation.  Workup revealed elevated BNP greater than 1500, elevated troponin 271, elevated creatinine, 2.83, and above baseline.  The patient received IV Lasix and IV Solu-Medrol.  TRH, hospitalist service, was asked to admit for further management.  Due to increased work of breathing and new hypoxia with O2 saturation in the 50s on room air.  The patient was placed on BiPAP.  ED Course: Temperature 98.  BP 151/84, pulse 101, respiratory rate 25, O2 saturation 94% on 3 L.  Lab studies notable for serum glucose 199, BUN 45, creatinine 2.83 from baseline of 2.3, alkaline phosphatase 138, GFR 22, BNP 1530, high-sensitivity troponin 271.  WBC 14.9, hemoglobin 8.2, platelet count 273.  Review of Systems: Review of systems as noted in the HPI. All other systems reviewed and are negative.   Past Medical History:  Diagnosis Date   Arthritis    Carotid artery occlusion    Chronic kidney disease    DVT (deep venous thrombosis) (HCC) 2008   GERD (gastroesophageal reflux disease)    takes Protonix daily   Gout    takes Allopurinol  daily and Indomethacin prn   H/O renal artery stenosis    H/O: GI bleed    History of blood transfusion    History of colon polyps    History of gastric ulcer    Hyperlipidemia    takes Lipitor daily   Hypertension    takes Metoprolol and Amlodipine daily   Joint pain    Joint swelling    Sleep apnea    Past Surgical History:  Procedure Laterality Date   CARDIOVERSION N/A 11/18/2022   Procedure: CARDIOVERSION;  Surgeon: Chilton Si, MD;  Location: [facility] INVASIVE CV LAB;  Service: Cardiovascular;  Laterality: N/A;   CAROTID ENDARTERECTOMY  2008   right CEA   CHOLECYSTECTOMY  mid 90's   COLON SURGERY     COLONOSCOPY     ENDARTERECTOMY  06/02/2012   Procedure: ENDARTERECTOMY CAROTID;  Surgeon: Nada Libman, MD;  Location: [facility] OR;  Service: Vascular;  Laterality: Left;   ESOPHAGOGASTRODUODENOSCOPY     HERNIA REPAIR     umbilical hernia   PATCH ANGIOPLASTY  06/02/2012   Procedure: PATCH ANGIOPLASTY;  Surgeon: Nada Libman, MD;  Location: [facility] OR;  Service: Vascular;  Laterality: Left;  using Vascu-Guard Patch   PERIPHERAL VASCULAR CATHETERIZATION Right 12/03/2015   Procedure: Peripheral Vascular Balloon Angioplasty;  Surgeon: Nada Libman, MD;  Location: [facility] INVASIVE CV LAB;  Service: Cardiovascular;  Laterality: Right;  RENAL unsuccessful   RENAL ARTERY STENT  2009   Right renal artery stenting-  Right Kidney   small intestine removed  2001   d/t random gi bleeding  SUBXYPHOID PERICARDIAL WINDOW N/A 09/23/2022   Procedure: SUBXYPHOID PERICARDIAL WINDOW;  Surgeon: Loreli Slot, MD;  Location: [facility] OR;  Service: Thoracic;  Laterality: N/A;   TEE WITHOUT CARDIOVERSION N/A 09/23/2022   Procedure: TRANSESOPHAGEAL ECHOCARDIOGRAM;  Surgeon: Loreli Slot, MD;  Location: [facility] OR;  Service: Thoracic;  Laterality: N/A;    Social History:  reports that he quit smoking about 23 years ago. His smoking use included cigarettes. He started smoking about 59 years ago. He has a 54  pack-year smoking history. He has never used smokeless tobacco. He reports that he does not drink alcohol and does not use drugs.   Allergies  Allergen Reactions   Plavix [Clopidogrel Bisulfate] Other (See Comments)    GI Bleed   Uloric [Febuxostat] Other (See Comments)    Unknown reaction    Family History  Problem Relation Age of Onset   Hypertension Mother    Hyperlipidemia Mother    Other Mother        varicose veins   Hyperlipidemia Father    Hypertension Father    Diabetes Father    Clotting disorder Father    Deep vein thrombosis Father    Hypertension Sister    Diabetes Sister    Hyperlipidemia Sister    Varicose Veins Sister    Other Sister        Bleeding problems   Hypertension Brother    Hyperlipidemia Brother       Prior to Admission medications   Medication Sig Start Date End Date Taking? Authorizing Provider  allopurinol (ZYLOPRIM) 100 MG tablet TAKE 2 TABLETS DAILY 03/30/23   [name], [name], MD  amLODipine-atorvastatin (CADUET) 10-40 MG tablet TAKE 1 TABLET DAILY 03/30/23   [name], [name], MD  apixaban (ELIQUIS) 5 MG TABS tablet Take 1 tablet (5 mg total) by mouth 2 (two) times daily. 12/22/22   Eustace Pen, PA-C  aspirin EC 81 MG tablet Take 1 tablet (81 mg total) by mouth daily. Swallow whole. 09/28/22   Barrett, Rae Roam, PA-C  Cholecalciferol (VITAMIN D3) 50 MCG (2000 UT) TABS Take 2,000 Units by mouth daily.    [provider]  cloNIDine (CATAPRES) 0.3 MG tablet Take 0.3 mg by mouth 3 (three) times daily.    [provider]  donepezil (ARICEPT) 5 MG tablet Take 1 tablet (5 mg total) by mouth at bedtime. 05/26/23   [name], [name], MD  doxazosin (CARDURA) 2 MG tablet Take 4 mg by mouth 2 (two) times daily. 08/24/19   [provider]  ferrous sulfate 325 (65 FE) MG tablet Take 325 mg by mouth in the morning and at bedtime.    [provider]  furosemide (LASIX) 80 MG tablet Taking 120 mg by mouth in the am and  80 mg in the evening    [provider]  metoprolol tartrate (LOPRESSOR) 25 MG tablet Take 1 tablet (25 mg total) by mouth 2 (two) times daily. 12/22/22   Eustace Pen, PA-C  potassium chloride (MICRO-K) 10 MEQ CR capsule Take 10 mEq by mouth daily. 12/10/19   [provider]  sertraline (ZOLOFT) 50 MG tablet Take 1 tablet (50 mg total) by mouth daily. 05/26/23   [name], [name], MD  sulfamethoxazole-trimethoprim (BACTRIM DS) 800-160 MG tablet Take 1 tablet by mouth 2 (two) times daily. Patient not taking: Reported on 06/15/2023 04/01/23   [name], [name], MD    Physical Exam: BP 129/69   Pulse 93   Temp 98  F (36.7 C) (Oral)   Resp 18   SpO2 98%   General: 77 y.o. year-old male well developed well nourished in no acute distress.  Alert and oriented x3. Cardiovascular: Regular rate and rhythm with no rubs or gallops.  No thyromegaly or JVD noted.  3+ pitting edema in lower extremities bilaterally.   Respiratory: Diffuse rales bilaterally.  On BiPAP.  Poor inspiratory effort. Abdomen: Soft nontender nondistended with normal bowel sounds x4 quadrants. Muskuloskeletal: No cyanosis or clubbing noted bilaterally Neuro: CN II-XII intact, strength, sensation, reflexes Skin: No ulcerative lesions noted or rashes Psychiatry: Judgement and insight appear normal. Mood is appropriate for condition and setting          Labs on Admission:  Basic Metabolic Panel: Recent Labs  Lab 06/24/23 2241 06/24/23 2254  NA 135 137  K 3.7 3.8  CL 101  --   CO2 24  --   GLUCOSE 199*  --   BUN 45*  --   CREATININE 2.83*  --   CALCIUM 8.1*  --    Liver Function Tests: Recent Labs  Lab 06/24/23 2241  AST 31  ALT 18  ALKPHOS 138*  BILITOT 1.1  PROT 5.9*  ALBUMIN 3.1*   No results for input(s): "LIPASE", "AMYLASE" in the last 168 hours. No results for input(s): "AMMONIA" in the last 168 hours. CBC: Recent Labs  Lab 06/24/23 2241 06/24/23 2254  WBC 14.9*  --    NEUTROABS 13.6*  --   HGB 7.2* 8.2*  HCT 23.4* 24.0*  MCV 92.5  --   PLT 273  --    Cardiac Enzymes: No results for input(s): "CKTOTAL", "CKMB", "CKMBINDEX", "TROPONINI" in the last 168 hours.  BNP (last 3 results) Recent Labs    08/30/22 0725 06/24/23 2241  BNP 340.0* 1,530.0*    ProBNP (last 3 results) No results for input(s): "PROBNP" in the last 8760 hours.  CBG: No results for input(s): "GLUCAP" in the last 168 hours.  Radiological Exams on Admission: DG Chest Portable 1 View  Result Date: 06/24/2023 CLINICAL DATA:  Shortness of breath and hypoxemia. EXAM: PORTABLE CHEST 1 VIEW COMPARISON:  Portable chest 01/18/2023 FINDINGS: The heart is enlarged. There is increased perihilar vascular congestion. There is mild-to-moderate interstitial edema with a basal gradient. Findings consistent with CHF or fluid overload. There are moderate pleural effusions and overlying patchy atelectasis or consolidation to the mid hilar levels. No focal infiltrate is seen in the upper zones. The mediastinum is stable with aortic atherosclerosis. No new osseous findings. Multilevel thoracic spine bridging enthesopathy. Biapical pleural capping appears similar. IMPRESSION: 1. Cardiomegaly with increased perihilar vascular congestion and mild-to-moderate interstitial edema with a basal gradient. Findings consistent with CHF or fluid overload. 2. Moderate pleural effusions and overlying patchy atelectasis or consolidation. 3. Aortic atherosclerosis. Electronically Signed   By: Almira Bar M.D.   On: 06/24/2023 23:06    EKG: I independently viewed the EKG done and my findings are as followed: Atrial fibrillation rate of 102.  Nonspecific ST-T changes.  QTc 515.  Assessment/Plan Present on Admission:  Acute on chronic hypoxic respiratory failure (HCC)  Principal Problem:   Acute on chronic hypoxic respiratory failure (HCC)  Acute on chronic hypoxic respiratory failure secondary to pulmonary edema  and bilateral pleural effusions, suspect cardiogenic. At baseline uses 2 L nasal cannula Currently on BiPAP Wean off oxygen supplementation as tolerated. Continue diuresing Maintain O2 saturation above 90% Close monitoring of telemetry  Acute on chronic HFpEF Unclear trigger Continue  IV diuresing Start strict I's and O's and daily  Elevated troponin, suspect demand ischemia in the setting of acute on chronic HFpEF and acute on chronic hypoxic respiratory failure Initial troponin 271, trend Closely monitor on telemetry  AKI on CKD 4 At baseline creatinine of 1.8 with GFR of 26 Presented with creatinine of 2.93 with GFR of 22 Avoid nephrotoxic agents, dehydration, and hypotension Monitor urine output Repeat renal function in the morning  Type 2 diabetes with hyperglycemia Presented with hemoglobin of 199 Insulin sliding scale every 4 hours while NPO  Essential hypertension Renal artery stenosis  Resume home regimen Closely monitor vital signs  Persistent atrial fibrillation on Eliquis Resume home Eliquis Resume home beta-blocker for rate control when blood pressure allows and the patient is off BiPAP.  Hyperlipidemia Resume home regimen.  Physical debility PT OT assessment Fall precautions  QTc prolongation QTc on admission 12-lead EKG 515 Avoid QTc prolonging agents Optimize magnesium and potassium levels Monitor on telemetry.   Critical care time: 75 minutes.   DVT prophylaxis: Home Eliquis, per the patient's son at bedside, he took both doses of Eliquis on 06/24/2023, last dose was taken around 5 or 6 PM.  Code Status: Full code  Family Communication: Updated the patient's wife and son at bedside.  Disposition Plan: Admitted to progressive care unit  Consults called: None.  Admission status: Inpatient status.   Status is: Inpatient The patient requires at least 2 midnights for further evaluation and treatment of present condition.   Darlin Drop MD [facility] Pager [phone]  If 7PM-7AM, please contact night-coverage [email or web address] Password [facility]  06/25/2023, 12:46 AM

## 2023-06-25 NOTE — Assessment & Plan Note (Signed)
Continue with donepezil and sertraline.

## 2023-06-26 ENCOUNTER — Inpatient Hospital Stay (HOSPITAL_COMMUNITY): Payer: Medicare Other

## 2023-06-26 DIAGNOSIS — I10 Essential (primary) hypertension: Secondary | ICD-10-CM | POA: Diagnosis not present

## 2023-06-26 DIAGNOSIS — I5033 Acute on chronic diastolic (congestive) heart failure: Secondary | ICD-10-CM | POA: Diagnosis not present

## 2023-06-26 DIAGNOSIS — J42 Unspecified chronic bronchitis: Secondary | ICD-10-CM | POA: Diagnosis not present

## 2023-06-26 DIAGNOSIS — N184 Chronic kidney disease, stage 4 (severe): Secondary | ICD-10-CM | POA: Diagnosis not present

## 2023-06-26 LAB — BPAM RBC
Blood Product Expiration Date: 202412092359
ISSUE DATE / TIME: 202411291519
Unit Type and Rh: 9500

## 2023-06-26 LAB — TYPE AND SCREEN
ABO/RH(D): O NEG
Antibody Screen: NEGATIVE

## 2023-06-26 LAB — BASIC METABOLIC PANEL
Anion gap: 16 — ABNORMAL HIGH (ref 5–15)
BUN: 52 mg/dL — ABNORMAL HIGH (ref 8–23)
CO2: 20 mmol/L — ABNORMAL LOW (ref 22–32)
Calcium: 8.8 mg/dL — ABNORMAL LOW (ref 8.9–10.3)
Chloride: 102 mmol/L (ref 98–111)
Creatinine, Ser: 2.92 mg/dL — ABNORMAL HIGH (ref 0.61–1.24)
GFR, Estimated: 21 mL/min — ABNORMAL LOW (ref >60)
Glucose, Bld: 155 mg/dL — ABNORMAL HIGH (ref 70–99)
Potassium: 3.4 mmol/L — ABNORMAL LOW (ref 3.5–5.1)
Sodium: 138 mmol/L (ref 135–145)

## 2023-06-26 LAB — IRON AND TIBC
Iron: 17 ug/dL — ABNORMAL LOW (ref 45–182)
Saturation Ratios: 5 % — ABNORMAL LOW (ref 17.9–39.5)
TIBC: 360 ug/dL (ref 250–450)
UIBC: 343 ug/dL

## 2023-06-26 LAB — CBC
HCT: 26.6 % — ABNORMAL LOW (ref 39.0–52.0)
Hemoglobin: 8.2 g/dL — ABNORMAL LOW (ref 13.0–17.0)
MCH: 27.5 pg (ref 26.0–34.0)
MCHC: 30.8 g/dL (ref 30.0–36.0)
MCV: 89.3 fL (ref 80.0–100.0)
Platelets: 266 10*3/uL (ref 150–400)
RBC: 2.98 MIL/uL — ABNORMAL LOW (ref 4.22–5.81)
RDW: 14.6 % (ref 11.5–15.5)
WBC: 8.8 10*3/uL (ref 4.0–10.5)
nRBC: 0 % (ref 0.0–0.2)

## 2023-06-26 LAB — GLUCOSE, CAPILLARY
Glucose-Capillary: 135 mg/dL — ABNORMAL HIGH (ref 70–99)
Glucose-Capillary: 116 mg/dL — ABNORMAL HIGH (ref 70–99)
Glucose-Capillary: 135 mg/dL — ABNORMAL HIGH (ref 70–99)
Glucose-Capillary: 135 mg/dL — ABNORMAL HIGH (ref 70–99)

## 2023-06-26 LAB — TRANSFERRIN: Transferrin: 266 mg/dL (ref 180–329)

## 2023-06-26 LAB — FERRITIN: Ferritin: 30 ng/mL (ref 24–336)

## 2023-06-26 MED ORDER — IPRATROPIUM-ALBUTEROL 0.5-2.5 (3) MG/3ML IN SOLN
3.0000 mL | Freq: Three times a day (TID) | RESPIRATORY_TRACT | Status: DC
  Administered 2023-06-27 – 2023-06-28 (×4): 3 mL via RESPIRATORY_TRACT
  Filled 2023-06-26 (×4): qty 3

## 2023-06-26 MED ORDER — ALBUTEROL SULFATE (2.5 MG/3ML) 0.083% IN NEBU: 2.5000 mg | INHALATION_SOLUTION | RESPIRATORY_TRACT | Status: DC | PRN

## 2023-06-26 MED ORDER — POTASSIUM CHLORIDE CRYS ER 20 MEQ PO TBCR
40.0000 meq | EXTENDED_RELEASE_TABLET | ORAL | Status: AC
  Administered 2023-06-26 (×2): 40 meq via ORAL
  Filled 2023-06-26 (×2): qty 2

## 2023-06-26 MED ORDER — IPRATROPIUM-ALBUTEROL 0.5-2.5 (3) MG/3ML IN SOLN
3.0000 mL | Freq: Four times a day (QID) | RESPIRATORY_TRACT | Status: DC
  Administered 2023-06-26 (×2): 3 mL via RESPIRATORY_TRACT
  Filled 2023-06-26 (×2): qty 3

## 2023-06-26 NOTE — Progress Notes (Addendum)
Progress Note   Patient: James [name] QVZ:[id number] DOB: 1946/07/13 DOA: 06/24/2023     1 DOS: the patient was seen and examined on 06/26/2023   Brief hospital course: James [name] was admitted to the hospital with the working diagnosis of heart failure decompensation.   77 yo male with the past medical history of heart failure, chronic hypoxia, hypertension, atrial fibrillation, CKD stage IV and hyperlipidemia who presented with dyspnea. At home patient developed respiratory distress, EMS was his 02 saturation was found 53% on 5 L/min per nasal cannula of supplemental 02, he was placed on non re-breather mask and was transported to the ED.  On his initial physical examination his blood pressure was 151/81, HR 101, RR 25 and 02 saturation 94%, on supplemental 02 per [name], heart with S1 and S2 present and regular with no gallops or rubs, positive JVD, lungs with diffuse rales bilaterally, abdomen with no distention and positive +++ lower extremity edema.   Chest radiograph with hyperinflation, with bilateral hilar vascular congestion, cephalization of the vasculature and bilateral central interstitial infiltrates, bilateral moderate pleural effusions.   EKG 102 bpm, left axis deviation, qtc 545, sinus rhythm with poor R R wave progression, with no significant ST segment changes and negative T wave V5 and V6.   Patient was placed on IV furosemide with improvement in his symptoms.  Weaned off bipap to nasal cannula.  11/29 hgb 6.7. transfused one unit PRBC.   11/30 using intermittent Bipap for dyspnea and increased work of breathing.    Assessment and Plan: * Acute on chronic diastolic CHF (congestive heart failure) (HCC) Echocardiogram with preserved LV systolic function with EF 60 to 65%, mild LVH, RV systolic function not well visualized. RVSP 26.8 mmHg. LA with moderate dilatation, RA mild dilatation, small pericardial effusion, no significant valvular disease.   Urine output 676  ml, but his weight is down 4 Kg from yesterday.    Plan to continue diuresis with furosemide IV 80 mg IV q 12 hrs.  Blood pressure control with clonidine.   Acute on chronic hypoxemic respiratory failure due to acute cardiogenic pulmonary edema.  This am 02 saturation 97% on 3 L/min per [name] Using intermittent Bipap for dyspnea.   Follow up chest radiograph today bilateral hilar vascular congestion with bilateral pleural effusions, more right than left.  If persistent effusion despite diuresis will consider thoracentesis.   COPD (chronic obstructive pulmonary disease) (HCC) No signs of exacerbation. Continue with bronchodilator therapy.   Hypertension Continue blood pressure control with clonidine.    CKD (chronic kidney disease) stage 4, GFR 15-29 ml/min (HCC) Hypokalemia.   Renal function with serum cr at 2,92 with K at 3,4 and serum bicarbonate at 20  Na 138   Plan to continue diuresis with IV furosemide Add Kcl 40 meq x 2 doses.  Follow up renal function in am.   Paroxysmal atrial fibrillation (HCC) Atrial fibrillation with RVR.  Metoprolol dose has been increased to 25 mg po bid for better rate control.  Anticoagulation with apixaban.   Hyperlipidemia LDL goal <100 Continue statin therapy   Symptomatic anemia Positive iron deficiency with serum iron 17, TIBC 360, transferrin saturation 5 and ferritin 30 Sp one unit PRBC transfusion  Follow up hgb is 8.2  Plan to add IV iron during this hospitalization when patient more euvolemic.  Continue anticoagulation with apixaban, since there is no signs of acute bleeding.  Hold on aspirin.   Dementia (HCC) Continue with donepezil and sertraline.  Subjective: Patient this morning with worsening dyspnea and had to be placed back on bipap.   Physical Exam: Vitals:   06/26/23 0329 06/26/23 0530 06/26/23 0731 06/26/23 1130  BP: (!) 149/79 137/84 (!) 156/88 (!) 174/87  Pulse: 87 79 (!) 106 (!) 127  Resp: (!)  23  20 (!) 24  Temp: 98.6 F (37 C)  97.9 F (36.6 C)   TempSrc: Oral  Oral   SpO2: 100% 98% 97% 95%  Weight:    80.5 kg  Height:       Neurology awake and alert ENT with bipap full face mask in place Cardiovascular with S1 and S2 present, irregularly irregular with no gallops, rubs or murmurs Mild JVD Positive lower extremity edema +  Respiratory with prolonged expiratory phase with no wheezing or rhonchi, positive rales Abdomen with no distention  Data Reviewed:    Family Communication: no family at the bedside   Disposition: Status is: Inpatient Remains inpatient appropriate because: IV diuresis and intermittent Bipap   Planned Discharge Destination: Home     Author: Coralie Keens, MD 06/26/2023 1:09 PM  For on call review [email or web address].

## 2023-06-26 NOTE — Evaluation (Signed)
Physical Therapy Evaluation Patient Details Name: James [name] MRN: [id number] DOB: [birth year] Today's Date: 06/26/2023  History of Present Illness  James [name] is a 77 y.o. male. Admitted 06/24/23 with SOB and acute respiratory distress, hypoxic with O2 saturation in the 50s on room air. Dx with acute chronic diastolic CHF and volume overloaded with 3+ pitting edema in lower extremities bilaterally, diffuse rales on lungs auscultation. PMH includes chronic HFpEF, chronic hypoxic respiratory failure on 2 L nasal cannula, hypertension, hyperlipidemia, renal artery stenosis status post arterial stent, CKD stage IV, persistent atrial fibrillation on Eliquis.  Clinical Impression  Pt admitted with/for SOB due to [facility].  Pt has improved greatly and is closing in on his baseline function, but still was not Independent with gait in the halls today  Stairs were not tested.  Pt currently limited functionally due to the problems listed below.  (see problems list.)  Pt will benefit from PT to maximize function and safety to be able to get home safely with available assist.         If plan is discharge home, recommend the following: Other (comment) (PRN assist)   Can travel by private vehicle        Equipment Recommendations None recommended by PT  Recommendations for Other Services       Functional Status Assessment Patient has had a recent decline in their functional status and demonstrates the ability to make significant improvements in function in a reasonable and predictable amount of time.     Precautions / Restrictions Precautions Precautions: Fall Precaution Comments: watch SpO2      Mobility  Bed Mobility Overal bed mobility: Modified Independent                  Transfers Overall transfer level: Modified independent Equipment used: None               General transfer comment: used UE's appropriately, safe stability level     Ambulation/Gait Ambulation/Gait assistance: Supervision Gait Distance (Feet): 300 Feet Assistive device: None Gait Pattern/deviations: Step-through pattern   Gait velocity interpretation: 1.31 - 2.62 ft/sec, indicative of limited community ambulator   General Gait Details: generally steady with moderate speed, ability to scan, o/w not challenged further.  With activity, HR rose to 120's/130's bpm and sats on 4L [place] dropped to a witnessed low of 86%, rebounding to 89% with standing restbreak..  With sitting rest, pt recoverered to 93% in less than a minute.  Stairs            Wheelchair Mobility     Tilt Bed    Modified Rankin (Stroke Patients Only)       Balance Overall balance assessment: Needs assistance Sitting-balance support: No upper extremity supported, Feet supported Sitting balance-Leahy Scale: Good     Standing balance support: No upper extremity supported, During functional activity Standing balance-Leahy Scale: Fair                               Pertinent Vitals/Pain Pain Assessment Pain Assessment: No/denies pain    Home Living Family/patient expects to be discharged to:: Private residence Living Arrangements: Spouse/significant other;Children Available Help at Discharge: Family Type of Home: House Home Access: Stairs to enter Entrance Stairs-Rails: Left Entrance Stairs-Number of Steps: 3-4 Alternate Level Stairs-Number of Steps: flight Home Layout: Multi-level;Able to live on main level with bedroom/bathroom Home Equipment: Grab bars - tub/shower;Rolling Walker (2 wheels);Cane - single  point;BSC/3in1 (DME from when father passed)      Prior Function Prior Level of Function : Independent/Modified Independent;Driving                     Extremity/Trunk Assessment   Upper Extremity Assessment Upper Extremity Assessment: Defer to OT evaluation    Lower Extremity Assessment Lower Extremity Assessment: Overall WFL for  tasks assessed    Cervical / Trunk Assessment Cervical / Trunk Assessment: Normal  Communication   Communication Communication: No apparent difficulties  Cognition Arousal: Alert Behavior During Therapy: WFL for tasks assessed/performed Overall Cognitive Status: Within Functional Limits for tasks assessed                                          General Comments      Exercises     Assessment/Plan    PT Assessment Patient needs continued PT services  PT Problem List Decreased activity tolerance;Decreased mobility;Decreased knowledge of precautions;Cardiopulmonary status limiting activity       PT Treatment Interventions Gait training;Stair training;Functional mobility training;Therapeutic activities;Patient/family education    PT Goals (Current goals can be found in the Care Plan section)  Acute Rehab PT Goals Patient Stated Goal: back to my PLOF PT Goal Formulation: With patient Time For Goal Achievement: 07/03/23 Potential to Achieve Goals: Good    Frequency Min 3X/week     Co-evaluation PT/OT/SLP Co-Evaluation/Treatment: Yes Reason for Co-Treatment: To address functional/ADL transfers PT goals addressed during session: Mobility/safety with mobility OT goals addressed during session: ADL's and self-care       AM-PAC PT "6 Clicks" Mobility  Outcome Measure Help needed turning from your back to your side while in a flat bed without using bedrails?: None Help needed moving from lying on your back to sitting on the side of a flat bed without using bedrails?: None Help needed moving to and from a bed to a chair (including a wheelchair)?: None Help needed standing up from a chair using your arms (e.g., wheelchair or bedside chair)?: None Help needed to walk in hospital room?: A Little Help needed climbing 3-5 steps with a railing? : A Little 6 Click Score: 22    End of Session Equipment Utilized During Treatment: Oxygen Activity Tolerance:  Patient tolerated treatment well Patient left: in chair;with call bell/phone within reach Nurse Communication: Mobility status PT Visit Diagnosis: Difficulty in walking, not elsewhere classified (R26.2)    Time:  -      Charges:   PT Evaluation $PT Eval Moderate Complexity: 1 Mod   PT General Charges $$ ACUTE PT VISIT: 1 Visit         06/26/2023  Jacinto Halim., PT Acute Rehabilitation Services [phone]  (office)  Eliseo Gum Gibson Lad 06/26/2023, 4:27 PM

## 2023-06-26 NOTE — Evaluation (Signed)
Occupational Therapy Evaluation Patient Details Name: James [name] MRN: [id number] DOB: 15-May-1946 Today's Date: 06/26/2023   History of Present Illness Ousman Golson Cozort is a 77 y.o. male. Admitted 06/24/23 with SOB and acute respiratory distress, hypoxic with O2 saturation in the 50s on room air. Dx with acute chronic diastolic CHF and volume overloaded with 3+ pitting edema in lower extremities bilaterally, diffuse rales on lungs auscultation. PMH includes chronic HFpEF, chronic hypoxic respiratory failure on 2 L nasal cannula, hypertension, hyperlipidemia, renal artery stenosis status post arterial stent, CKD stage IV, persistent atrial fibrillation on Eliquis.   Clinical Impression   Pt is typically independent in ADL and functional transfers, today he is overall supervision to mod I for ADL and functional transfers mainly due to hospital setting. He was on 4L O2 via [place] throughout and with activity got very DOE 3/4 SpO2 dropping to 86%. RR up to 37 with activity. Took approx 4-5 min of seated rest to recover utilizing pursed lip breathing. At this time recommend OT acutely without OT follow up post-acute.  Next session to focus on energy conservation education.       If plan is discharge home, recommend the following: Assistance with cooking/housework    Functional Status Assessment  Patient has had a recent decline in their functional status and demonstrates the ability to make significant improvements in function in a reasonable and predictable amount of time.  Equipment Recommendations  Tub/shower seat    Recommendations for Other Services PT consult     Precautions / Restrictions Precautions Precautions: Fall Precaution Comments: watch SpO2 Restrictions Weight Bearing Restrictions: No      Mobility Bed Mobility Overal bed mobility: Modified Independent                  Transfers Overall transfer level: Modified independent Equipment used: None                General transfer comment: used UE's appropriately, safe stability level      Balance Overall balance assessment: Needs assistance Sitting-balance support: No upper extremity supported, Feet supported Sitting balance-Leahy Scale: Good     Standing balance support: No upper extremity supported, During functional activity Standing balance-Leahy Scale: Fair                             ADL either performed or assessed with clinical judgement   ADL Overall ADL's : Needs assistance/impaired Eating/Feeding: Independent   Grooming: Supervision/safety;Standing Grooming Details (indicate cue type and reason): decreased activity tolerance for standing and participating in ADL Upper Body Bathing: Modified independent;Sitting   Lower Body Bathing: Modified independent;Sitting/lateral leans   Upper Body Dressing : Modified independent   Lower Body Dressing: Modified independent   Toilet Transfer: Modified Independent;Ambulation   Toileting- Clothing Manipulation and Hygiene: Modified independent       Functional mobility during ADLs: Supervision/safety General ADL Comments: decreased actvity tolerance, increased O2 needs during functional activity     Vision Baseline Vision/History: 1 Wears glasses Ability to See in Adequate Light: 0 Adequate Patient Visual Report: No change from baseline       Perception         Praxis         Pertinent Vitals/Pain Pain Assessment Pain Assessment: No/denies pain     Extremity/Trunk Assessment Upper Extremity Assessment Upper Extremity Assessment: Overall WFL for tasks assessed   Lower Extremity Assessment Lower Extremity Assessment: Defer to PT evaluation  Cervical / Trunk Assessment Cervical / Trunk Assessment: Normal   Communication Communication Communication: No apparent difficulties   Cognition Arousal: Alert Behavior During Therapy: WFL for tasks assessed/performed Overall Cognitive Status:  Within Functional Limits for tasks assessed                                       General Comments  on 4L O2 throughout session, SpO2 down to 86% and DOE 3/4. (RR up to 37) Recovery to 24 RR took aprox 4 min of seated rest with PLB    Exercises Exercises: Other exercises Other Exercises Other Exercises: pursed lip breathing   Shoulder Instructions      Home Living Family/patient expects to be discharged to:: Private residence Living Arrangements: Spouse/significant other;Children Available Help at Discharge: Family Type of Home: House Home Access: Stairs to enter Secretary/administrator of Steps: 3-4 Entrance Stairs-Rails: Left Home Layout: Multi-level;Able to live on main level with bedroom/bathroom Alternate Level Stairs-Number of Steps: flight   Bathroom Shower/Tub: Tub/shower unit   Bathroom Toilet: Handicapped height     Home Equipment: Grab bars - tub/shower;Rolling Environmental consultant (2 wheels);Cane - single point;BSC/3in1 (DME from when father passed)          Prior Functioning/Environment Prior Level of Function : Independent/Modified Independent;Driving                        OT Problem List: Decreased activity tolerance;Cardiopulmonary status limiting activity      OT Treatment/Interventions:      OT Goals(Current goals can be found in the care plan section) Acute Rehab OT Goals Patient Stated Goal: breathe easier OT Goal Formulation: With patient Time For Goal Achievement: 07/10/23 Potential to Achieve Goals: Good  OT Frequency:      Co-evaluation   Reason for Co-Treatment: To address functional/ADL transfers PT goals addressed during session: Mobility/safety with mobility OT goals addressed during session: ADL's and self-care      AM-PAC OT "6 Clicks" Daily Activity     Outcome Measure Help from another person eating meals?: None Help from another person taking care of personal grooming?: None Help from another person toileting,  which includes using toliet, bedpan, or urinal?: None Help from another person bathing (including washing, rinsing, drying)?: None Help from another person to put on and taking off regular upper body clothing?: None Help from another person to put on and taking off regular lower body clothing?: None 6 Click Score: 24   End of Session Equipment Utilized During Treatment: Oxygen (4L) Nurse Communication: Mobility status  Activity Tolerance: Patient tolerated treatment well Patient left: in chair;with call bell/phone within reach;with chair alarm set  OT Visit Diagnosis: Muscle weakness (generalized) (M62.81)                Time: 0981-1914 OT Time Calculation (min): 32 min Charges:  OT General Charges $OT Visit: 1 Visit OT Evaluation $OT Eval Moderate Complexity: 1 Mod  Nyoka Cowden OTR/L Acute Rehabilitation Services Office: [phone]  Emelda Fear 06/26/2023, 5:15 PM

## 2023-06-27 DIAGNOSIS — D509 Iron deficiency anemia, unspecified: Secondary | ICD-10-CM

## 2023-06-27 DIAGNOSIS — I10 Essential (primary) hypertension: Secondary | ICD-10-CM | POA: Diagnosis not present

## 2023-06-27 DIAGNOSIS — I480 Paroxysmal atrial fibrillation: Secondary | ICD-10-CM | POA: Diagnosis not present

## 2023-06-27 DIAGNOSIS — I5033 Acute on chronic diastolic (congestive) heart failure: Secondary | ICD-10-CM | POA: Diagnosis not present

## 2023-06-27 DIAGNOSIS — N184 Chronic kidney disease, stage 4 (severe): Secondary | ICD-10-CM | POA: Diagnosis not present

## 2023-06-27 DIAGNOSIS — F0393 Unspecified dementia, unspecified severity, with mood disturbance: Secondary | ICD-10-CM

## 2023-06-27 LAB — GLUCOSE, CAPILLARY
Glucose-Capillary: 107 mg/dL — ABNORMAL HIGH (ref 70–99)
Glucose-Capillary: 108 mg/dL — ABNORMAL HIGH (ref 70–99)
Glucose-Capillary: 105 mg/dL — ABNORMAL HIGH (ref 70–99)
Glucose-Capillary: 140 mg/dL — ABNORMAL HIGH (ref 70–99)

## 2023-06-27 LAB — BASIC METABOLIC PANEL
Anion gap: 8 (ref 5–15)
BUN: 49 mg/dL — ABNORMAL HIGH (ref 8–23)
CO2: 26 mmol/L (ref 22–32)
Calcium: 8.5 mg/dL — ABNORMAL LOW (ref 8.9–10.3)
Chloride: 105 mmol/L (ref 98–111)
Creatinine, Ser: 2.64 mg/dL — ABNORMAL HIGH (ref 0.61–1.24)
GFR, Estimated: 24 mL/min — ABNORMAL LOW (ref >60)
Glucose, Bld: 119 mg/dL — ABNORMAL HIGH (ref 70–99)
Potassium: 3.5 mmol/L (ref 3.5–5.1)
Sodium: 139 mmol/L (ref 135–145)

## 2023-06-27 LAB — HEMOGLOBIN AND HEMATOCRIT, BLOOD
HCT: 26.5 % — ABNORMAL LOW (ref 39.0–52.0)
Hemoglobin: 8.1 g/dL — ABNORMAL LOW (ref 13.0–17.0)

## 2023-06-27 LAB — MAGNESIUM: Magnesium: 2.2 mg/dL (ref 1.7–2.4)

## 2023-06-27 MED ORDER — SODIUM CHLORIDE 0.9 % IV SOLN
200.0000 mg | Freq: Once | INTRAVENOUS | Status: AC
  Administered 2023-06-27: 200 mg via INTRAVENOUS
  Filled 2023-06-27: qty 10

## 2023-06-27 MED ORDER — METOPROLOL TARTRATE 50 MG PO TABS
50.0000 mg | ORAL_TABLET | Freq: Two times a day (BID) | ORAL | Status: DC
  Administered 2023-06-27 – 2023-06-28 (×2): 50 mg via ORAL
  Filled 2023-06-27 (×2): qty 1

## 2023-06-27 MED ORDER — POTASSIUM CHLORIDE CRYS ER 20 MEQ PO TBCR
40.0000 meq | EXTENDED_RELEASE_TABLET | Freq: Once | ORAL | Status: AC
  Administered 2023-06-27: 40 meq via ORAL
  Filled 2023-06-27: qty 2

## 2023-06-27 MED ORDER — ATORVASTATIN CALCIUM 40 MG PO TABS
40.0000 mg | ORAL_TABLET | Freq: Every day | ORAL | Status: DC
  Administered 2023-06-27 – 2023-06-28 (×2): 40 mg via ORAL
  Filled 2023-06-27 (×2): qty 1

## 2023-06-27 MED ORDER — METOPROLOL TARTRATE 25 MG PO TABS
25.0000 mg | ORAL_TABLET | Freq: Once | ORAL | Status: AC
  Administered 2023-06-27: 25 mg via ORAL
  Filled 2023-06-27: qty 1

## 2023-06-27 NOTE — Progress Notes (Signed)
RN contacted the pt's provider Arrien, York Ram earlier today about the pt throat clearing a lot after drinking & also about the pt defecating a lot. Pt's stools have all been dark but not foul smelling so assuming to be d/t iron. Pt says the stools have been happening a lot more lately than normal so much so he's barely eating today as he says if he does it'll just make him go to the bathroom. Pt's provider notified of both  & stated he would follow up. This RN will continue to monitor the pt. All of his stools have been soft or loose. Sanda Linger, RN

## 2023-06-27 NOTE — Plan of Care (Signed)
  Problem: Tissue Perfusion: Goal: Adequacy of tissue perfusion will improve Outcome: Not Progressing

## 2023-06-27 NOTE — Progress Notes (Signed)
Progress Note   Patient: James [name] GMW:[id number] DOB: 22-Dec-1945 DOA: 06/24/2023     2 DOS: the patient was seen and examined on 06/27/2023   Brief hospital course: James [name] was admitted to the hospital with the working diagnosis of heart failure decompensation.   James [name] with the past medical history of heart failure, chronic hypoxia, hypertension, atrial fibrillation, CKD stage IV and hyperlipidemia who presented with dyspnea. At home patient developed respiratory distress, EMS was his 02 saturation was found 53% on 5 L/min per nasal cannula of supplemental 02, he was placed on non re-breather mask and was transported to the ED.  On his initial physical examination his blood pressure was 151/81, HR 101, RR 25 and 02 saturation 94%, on supplemental 02 per [place], heart with S1 and S2 present and regular with no gallops or rubs, positive JVD, lungs with diffuse rales bilaterally, abdomen with no distention and positive +++ lower extremity edema.   Chest radiograph with hyperinflation, with bilateral hilar vascular congestion, cephalization of the vasculature and bilateral central interstitial infiltrates, bilateral moderate pleural effusions.   EKG 102 bpm, left axis deviation, qtc 545, sinus rhythm with poor R R wave progression, with no significant ST segment changes and negative T wave V5 and V6.   Patient was placed on IV furosemide with improvement in his symptoms.  Weaned off bipap to nasal cannula.  11/29 hgb 6.7. transfused one unit PRBC.   11/30 using intermittent Bipap for dyspnea and increased work of breathing.  12/01 improving volume status and symptoms. Continue to have not controlled atrial fibrillation.   Assessment and Plan: * Acute on chronic diastolic CHF (congestive heart failure) (HCC) Echocardiogram with preserved LV systolic function with EF 60 to 65%, mild LVH, RV systolic function not well visualized. RVSP 26.8 mmHg. LA with moderate dilatation, RA  mild dilatation, small pericardial effusion, no significant valvular disease.   Urine output 1,100 ml Systolic blood pressure 140 to 150 mmHg.   Plan to continue diuresis with furosemide IV 80 mg IV q 12 hrs.  Blood pressure control with clonidine.  Add Ted hose.   Acute on chronic hypoxemic respiratory failure due to acute cardiogenic pulmonary edema.  This am 02 saturation 97% on 3 L/min per [place] Using intermittent Bipap for dyspnea.  11/30 follow up chest radiograph with bilateral hilar vascular congestion with bilateral pleural effusions, more right than left.   Paroxysmal atrial fibrillation (HCC) Atrial fibrillation with RVR.  Telemetry with atrial fibrillation with rate 100 to 120 bpm.   Plan to increase metoprolol to 50 mg po bid, for better rate control atrial fibrillation.  Anticoagulation with apixaban.   Hypertension Continue blood pressure control with clonidine.    CKD (chronic kidney disease) stage 4, GFR 15-29 ml/min (HCC) Hypokalemia.   Improved volume status, renal function with serum cr at 2,64 with K at 3,5 and serum bicarbonate at 26  Na 139 Mg 2.2   Continue diuresis with furosemide Add 40 meq Kcl to prevent hypokalemia.  Follow up renal function and electrolytes in am.   COPD (chronic obstructive pulmonary disease) (HCC) No signs of exacerbation. Continue with bronchodilator therapy.   Hyperlipidemia LDL goal <100 Continue statin therapy   Iron deficiency anemia Positive iron deficiency with serum iron 17, TIBC 360, transferrin saturation 5 and ferritin 30 Sp one unit PRBC transfusion  Follow up hgb is 8.2  Will add IV iron today.  Continue anticoagulation with apixaban, since there is no signs of acute  bleeding.  Hold on aspirin.   Dementia (HCC) Continue with donepezil and sertraline.       Subjective: Patient with improvement in dyspnea and edema, he is out of bed to chair today. He used bipap last night but not so far during the day  today   Physical Exam: Vitals:   06/27/23 0500 06/27/23 0743 06/27/23 0806 06/27/23 0933  BP:   (!) 169/91   Pulse:  (!) 102 (!) 108 (!) 106  Resp:  19 19   Temp:   98.1 F (36.7 C)   TempSrc:   Axillary   SpO2:  98% 94%   Weight: 79.7 kg     Height:       Neurology awake and alert ENT With mild pallor Cardiovascular with S1 and S2 present, irregularly irregular with no gallops, rubs or murmurs Respiratory with mild rales at bases with no wheezing or rhonchi Abdomen with no distention  Positive lower extremity edema +  Data Reviewed:    Family Communication: no family at the bedside   Disposition: Status is: Inpatient Remains inpatient appropriate because: Diuresis and telemetry   Planned Discharge Destination: Home     Author: Coralie Keens, MD 06/27/2023 10:54 AM  For on call review [email or web address].

## 2023-06-27 NOTE — Plan of Care (Signed)
  Problem: Tissue Perfusion: Goal: Adequacy of tissue perfusion will improve 06/27/2023 0443 by Caroll Rancher, RN Outcome: Progressing 06/27/2023 0441 by Caroll Rancher, RN Outcome: Not Progressing

## 2023-06-27 NOTE — Plan of Care (Signed)
  Problem: Education: Goal: Ability to describe self-care measures that may prevent or decrease complications (Diabetes Survival Skills Education) will improve Outcome: Progressing Goal: Individualized Educational Video(s) Outcome: Progressing   Problem: Coping: Goal: Ability to adjust to condition or change in health will improve Outcome: Progressing   Problem: Fluid Volume: Goal: Ability to maintain a balanced intake and output will improve Outcome: Progressing Note: Pt is urinating  a lot & his respirations are getting better   Problem: Health Behavior/Discharge Planning: Goal: Ability to identify and utilize available resources and services will improve Outcome: Progressing Goal: Ability to manage health-related needs will improve Outcome: Progressing   Problem: Metabolic: Goal: Ability to maintain appropriate glucose levels will improve Outcome: Progressing Note: His glucose levels are much better today.   Problem: Nutritional: Goal: Maintenance of adequate nutrition will improve Outcome: Progressing Goal: Progress toward achieving an optimal weight will improve Outcome: Progressing   Problem: Skin Integrity: Goal: Risk for impaired skin integrity will decrease Outcome: Progressing   Problem: Tissue Perfusion: Goal: Adequacy of tissue perfusion will improve Outcome: Progressing   Problem: Education: Goal: Knowledge of General Education information will improve Description: Including pain rating scale, medication(s)/side effects and non-pharmacologic comfort measures Outcome: Progressing   Problem: Health Behavior/Discharge Planning: Goal: Ability to manage health-related needs will improve Outcome: Progressing   Problem: Clinical Measurements: Goal: Ability to maintain clinical measurements within normal limits will improve Outcome: Progressing Goal: Will remain free from infection Outcome: Progressing Goal: Diagnostic test results will improve Outcome:  Progressing Goal: Respiratory complications will improve Outcome: Progressing Goal: Cardiovascular complication will be avoided Outcome: Progressing   Problem: Activity: Goal: Risk for activity intolerance will decrease Outcome: Progressing   Problem: Nutrition: Goal: Adequate nutrition will be maintained Outcome: Progressing   Problem: Coping: Goal: Level of anxiety will decrease Outcome: Progressing   Problem: Elimination: Goal: Will not experience complications related to bowel motility Outcome: Progressing Goal: Will not experience complications related to urinary retention Outcome: Progressing   Problem: Pain Management: Goal: General experience of comfort will improve Outcome: Progressing   Problem: Safety: Goal: Ability to remain free from injury will improve Outcome: Progressing   Problem: Skin Integrity: Goal: Risk for impaired skin integrity will decrease Outcome: Progressing

## 2023-06-28 ENCOUNTER — Other Ambulatory Visit (HOSPITAL_COMMUNITY): Payer: Self-pay

## 2023-06-28 DIAGNOSIS — N184 Chronic kidney disease, stage 4 (severe): Secondary | ICD-10-CM | POA: Diagnosis not present

## 2023-06-28 DIAGNOSIS — I480 Paroxysmal atrial fibrillation: Secondary | ICD-10-CM | POA: Diagnosis not present

## 2023-06-28 DIAGNOSIS — I5033 Acute on chronic diastolic (congestive) heart failure: Secondary | ICD-10-CM | POA: Diagnosis not present

## 2023-06-28 DIAGNOSIS — I10 Essential (primary) hypertension: Secondary | ICD-10-CM | POA: Diagnosis not present

## 2023-06-28 LAB — BASIC METABOLIC PANEL
Anion gap: 13 (ref 5–15)
BUN: 37 mg/dL — ABNORMAL HIGH (ref 8–23)
CO2: 25 mmol/L (ref 22–32)
Calcium: 8.4 mg/dL — ABNORMAL LOW (ref 8.9–10.3)
Chloride: 103 mmol/L (ref 98–111)
Creatinine, Ser: 2.46 mg/dL — ABNORMAL HIGH (ref 0.61–1.24)
GFR, Estimated: 26 mL/min — ABNORMAL LOW (ref >60)
Glucose, Bld: 115 mg/dL — ABNORMAL HIGH (ref 70–99)
Potassium: 3.2 mmol/L — ABNORMAL LOW (ref 3.5–5.1)
Sodium: 141 mmol/L (ref 135–145)

## 2023-06-28 LAB — GLUCOSE, CAPILLARY
Glucose-Capillary: 110 mg/dL — ABNORMAL HIGH (ref 70–99)
Glucose-Capillary: 92 mg/dL (ref 70–99)

## 2023-06-28 LAB — MAGNESIUM: Magnesium: 2.1 mg/dL (ref 1.7–2.4)

## 2023-06-28 MED ORDER — IPRATROPIUM-ALBUTEROL 0.5-2.5 (3) MG/3ML IN SOLN
3.0000 mL | Freq: Four times a day (QID) | RESPIRATORY_TRACT | 0 refills | Status: DC | PRN
  Filled 2023-06-28: qty 360, 30d supply, fill #0

## 2023-06-28 MED ORDER — IPRATROPIUM-ALBUTEROL 0.5-2.5 (3) MG/3ML IN SOLN: 3.0000 mL | Freq: Four times a day (QID) | RESPIRATORY_TRACT | 0 refills | Status: AC | PRN

## 2023-06-28 MED ORDER — ATORVASTATIN CALCIUM 40 MG PO TABS
40.0000 mg | ORAL_TABLET | Freq: Every day | ORAL | 0 refills | Status: DC
  Filled 2023-06-28: qty 30, 30d supply, fill #0

## 2023-06-28 MED ORDER — TORSEMIDE 20 MG PO TABS
80.0000 mg | ORAL_TABLET | Freq: Two times a day (BID) | ORAL | 0 refills | Status: DC
  Filled 2023-06-28: qty 240, 30d supply, fill #0

## 2023-06-28 MED ORDER — METOPROLOL TARTRATE 50 MG PO TABS
50.0000 mg | ORAL_TABLET | Freq: Two times a day (BID) | ORAL | 0 refills | Status: DC
  Filled 2023-06-28: qty 60, 30d supply, fill #0

## 2023-06-28 MED ORDER — IPRATROPIUM-ALBUTEROL 0.5-2.5 (3) MG/3ML IN SOLN: 3.0000 mL | Freq: Two times a day (BID) | RESPIRATORY_TRACT | Status: DC

## 2023-06-28 MED ORDER — POTASSIUM CHLORIDE CRYS ER 20 MEQ PO TBCR
40.0000 meq | EXTENDED_RELEASE_TABLET | ORAL | Status: DC
  Administered 2023-06-28: 40 meq via ORAL
  Filled 2023-06-28: qty 2

## 2023-06-28 MED ORDER — METOPROLOL TARTRATE 50 MG PO TABS: 50.0000 mg | ORAL_TABLET | Freq: Two times a day (BID) | ORAL | 0 refills | Status: DC

## 2023-06-28 MED ORDER — TORSEMIDE 40 MG PO TABS: 80.0000 mg | ORAL_TABLET | Freq: Two times a day (BID) | ORAL | 0 refills | Status: DC

## 2023-06-28 MED ORDER — ATORVASTATIN CALCIUM 40 MG PO TABS: 40.0000 mg | ORAL_TABLET | Freq: Every day | ORAL | 0 refills | Status: DC

## 2023-06-28 MED ORDER — TORSEMIDE 20 MG PO TABS: 80.0000 mg | ORAL_TABLET | Freq: Two times a day (BID) | ORAL | Status: DC

## 2023-06-28 NOTE — Progress Notes (Signed)
Occupational Therapy Treatment Patient Details Name: James [name] MRN: [id number] DOB: May 09, 1946 Today's Date: 06/28/2023   History of present illness James [name] is a 77 y.o. male. Admitted 06/24/23 with SOB and acute respiratory distress, hypoxic with O2 saturation in the 50s on room air. Dx with acute chronic diastolic CHF and volume overloaded with 3+ pitting edema in lower extremities bilaterally, diffuse rales on lungs auscultation. PMH includes chronic HFpEF, chronic hypoxic respiratory failure on 2 L nasal cannula, hypertension, hyperlipidemia, renal artery stenosis status post arterial stent, CKD stage IV, persistent atrial fibrillation on Eliquis.   OT comments  Session focus on energy conservation prior to discharge home. Patient remains on 4L O2 with HR intermittently rising to 131 with AFib noted. Patient with good teach back and understanding of energy conservation techniques with handout provided. Recommendation remains appropriate, will continue to follow acutely.       If plan is discharge home, recommend the following:  Assistance with Occupational hygienist for Other Services PT consult    Precautions / Restrictions Precautions Precautions: Fall Precaution Comments: watch SpO2 Restrictions Weight Bearing Restrictions: No       Mobility Bed Mobility               General bed mobility comments: patient declining OOB, recently up to [name] prior to OT entry    Transfers                   General transfer comment: patient declining OOB, recently up to [name] prior to OT entry     Balance Overall balance assessment: Needs assistance Sitting-balance support: No upper extremity supported, Feet supported Sitting balance-Leahy Scale: Good     Standing balance support: No upper extremity supported, During functional activity Standing balance-Leahy Scale: Fair                              ADL either performed or assessed with clinical judgement   ADL Overall ADL's : Needs assistance/impaired                 Upper Body Dressing : Modified independent                     General ADL Comments: Session focus on energy conservation education    Extremity/Trunk Assessment         Cervical / Trunk Assessment Cervical / Trunk Assessment: Normal    Vision Baseline Vision/History: 1 Wears glasses Ability to See in Adequate Light: 0 Adequate Patient Visual Report: No change from baseline     Perception     Praxis      Cognition Arousal: Alert Behavior During Therapy: WFL for tasks assessed/performed Overall Cognitive Status: Within Functional Limits for tasks assessed                                 General Comments: Tangential        Exercises Exercises: Other exercises Other Exercises Other Exercises: pursed lip breathing    Shoulder Instructions       General Comments      Pertinent Vitals/ Pain          Home Living Family/patient expects to be discharged to:: Private residence Living Arrangements: Spouse/significant other;Children Available Help at Discharge: Family Type of Home: House Home Access:  Stairs to enter [employer] of Steps: 3-4 Entrance Stairs-Rails: Left Home Layout: Multi-level;Able to live on main level with bedroom/bathroom Alternate Level Stairs-Number of Steps: flight   Bathroom Shower/Tub: Tub/shower unit   Bathroom Toilet: Handicapped height     Home Equipment: Grab bars - tub/shower;Rolling Environmental consultant (2 wheels);Cane - single point;BSC/3in1 (DME from when father passed)          Prior Functioning/Environment              Frequency  Min 1X/week        Progress Toward Goals  OT Goals(current goals can now be found in the care plan section)  Progress towards OT goals: Progressing toward goals  Acute Rehab OT Goals Patient Stated Goal: go  home OT Goal Formulation: With patient Time For Goal Achievement: 07/10/23 Potential to Achieve Goals: Good  Plan      Co-evaluation                 AM-PAC OT "6 Clicks" Daily Activity     Outcome Measure   Help from another person eating meals?: None Help from another person taking care of personal grooming?: None Help from another person toileting, which includes using toliet, bedpan, or urinal?: None Help from another person bathing (including washing, rinsing, drying)?: None Help from another person to put on and taking off regular upper body clothing?: None Help from another person to put on and taking off regular lower body clothing?: None 6 Click Score: 24    End of Session Equipment Utilized During Treatment: Oxygen (4L)  OT Visit Diagnosis: Muscle weakness (generalized) (M62.81)   Activity Tolerance Patient tolerated treatment well   Patient Left with call bell/phone within reach;in bed;with bed alarm set   Nurse Communication Mobility status        Time: 6045-4098 OT Time Calculation (min): 27 min  Charges: OT General Charges $OT Visit: 1 Visit OT Treatments $Self Care/Home Management : 23-37 mins  Pollyann Glen E. Jaquita Bessire, OTR/L Acute Rehabilitation Services [phone]   Cherlyn Cushing 06/28/2023, 11:51 AM

## 2023-06-28 NOTE — Plan of Care (Signed)
  Problem: Coping: Goal: Ability to adjust to condition or change in health will improve Outcome: Progressing   

## 2023-06-28 NOTE — Progress Notes (Signed)
Education done with pt & son brantley. Pt would not like HH so son was told to let the pcp know to arrange outpt therapy. Pt took all of his belongings & all questions including chf, meds & home care instructions answered with the son & pt. Sanda Linger, RN

## 2023-06-28 NOTE — Progress Notes (Signed)
Heart Failure Navigator Progress Note  Assessed for Heart & Vascular TOC clinic readiness.  Patient does not meet criteria due to EF 60-65%, No HF TOC per Dr. Ella Jubilee, patient to follow up with [facility] cardiology. .   Navigator will sign off at this time.   Rhae Hammock, BSN, Scientist, clinical (histocompatibility and immunogenetics) Only

## 2023-06-28 NOTE — Progress Notes (Signed)
PT Cancellation Note  Patient Details Name: James [name] MRN: [id number] DOB: 12/01/1945   Cancelled Treatment:    Reason Eval/Treat Not Completed: Other (comment). Pt reports he is going home today and feels comfortable with his mobility including stairs. Pt deferred session due to impending dc.   Angelina Ok [facility] 06/28/2023, 11:12 AM Skip Mayer PT Acute [employer] [phone]

## 2023-06-28 NOTE — Plan of Care (Signed)
  Problem: Education: Goal: Ability to describe self-care measures that may prevent or decrease complications (Diabetes Survival Skills Education) will improve Outcome: Adequate for Discharge Goal: Individualized Educational Video(s) Outcome: Adequate for Discharge   Problem: Coping: Goal: Ability to adjust to condition or change in health will improve Outcome: Completed/Met   Problem: Fluid Volume: Goal: Ability to maintain a balanced intake and output will improve Outcome: Adequate for Discharge   Problem: Health Behavior/Discharge Planning: Goal: Ability to identify and utilize available resources and services will improve Outcome: Completed/Met Goal: Ability to manage health-related needs will improve Outcome: Completed/Met   Problem: Metabolic: Goal: Ability to maintain appropriate glucose levels will improve Outcome: Completed/Met   Problem: Nutritional: Goal: Maintenance of adequate nutrition will improve Outcome: Adequate for Discharge Goal: Progress toward achieving an optimal weight will improve Outcome: Adequate for Discharge   Problem: Skin Integrity: Goal: Risk for impaired skin integrity will decrease Outcome: Completed/Met   Problem: Tissue Perfusion: Goal: Adequacy of tissue perfusion will improve Outcome: Adequate for Discharge   Problem: Education: Goal: Knowledge of General Education information will improve Description: Including pain rating scale, medication(s)/side effects and non-pharmacologic comfort measures Outcome: Completed/Met   Problem: Health Behavior/Discharge Planning: Goal: Ability to manage health-related needs will improve Outcome: Completed/Met   Problem: Clinical Measurements: Goal: Ability to maintain clinical measurements within normal limits will improve Outcome: Adequate for Discharge Goal: Will remain free from infection Outcome: Completed/Met Goal: Diagnostic test results will improve Outcome: Completed/Met Goal:  Respiratory complications will improve Outcome: Completed/Met Goal: Cardiovascular complication will be avoided Outcome: Completed/Met   Problem: Activity: Goal: Risk for activity intolerance will decrease Outcome: Adequate for Discharge   Problem: Nutrition: Goal: Adequate nutrition will be maintained Outcome: Adequate for Discharge   Problem: Coping: Goal: Level of anxiety will decrease Outcome: Adequate for Discharge   Problem: Elimination: Goal: Will not experience complications related to bowel motility Outcome: Adequate for Discharge Goal: Will not experience complications related to urinary retention Outcome: Completed/Met   Problem: Pain Management: Goal: General experience of comfort will improve Outcome: Completed/Met   Problem: Safety: Goal: Ability to remain free from injury will improve Outcome: Completed/Met

## 2023-06-28 NOTE — Discharge Summary (Addendum)
Physician Discharge Summary   Patient: James [name] MRN: [id number] DOB: Aug 16, 1945  Admit date:     06/24/2023  Discharge date: 06/28/23  Discharge Physician: York Ram Petra Sargeant   PCP: [name], [name], MD   Recommendations at discharge:    Patient has been transitioned from furosemide to torsemide for better diuresis, 80 mg po bid. Metoprolol increased to 50 mg po bid for rate control atrial fibrillation.  Holding amlodipine to avoid hypotension. Added duoneb as needed for bronchodilator Follow up renal function and electrolytes in 7 to 10 days.  Follow up with Dr [name] in 7 to 10 days Follow up with Cardiology as scheduled.   I spoke with patient's son and wife at the bedside, we talked in detail about patient's condition, plan of care and prognosis and all questions were addressed.   Discharge Diagnoses: Principal Problem:   Acute on chronic diastolic CHF (congestive heart failure) (HCC) Active Problems:   Paroxysmal atrial fibrillation (HCC)   Hypertension   CKD (chronic kidney disease) stage 4, GFR 15-29 ml/min (HCC)   COPD (chronic obstructive pulmonary disease) (HCC)   Hyperlipidemia LDL goal <100   Iron deficiency anemia   Dementia (HCC)  Resolved Problems:   * No resolved hospital problems. [facility] Course: James [name] was admitted to the hospital with the working diagnosis of heart failure decompensation.   77 yo male with the past medical history of heart failure, chronic hypoxia, hypertension, atrial fibrillation, CKD stage IV and hyperlipidemia who presented with dyspnea. At home patient developed respiratory distress, EMS was his 02 saturation was found 53% on 5 L/min per nasal cannula of supplemental 02, he was placed on non re-breather mask and was transported to the ED.  On his initial physical examination his blood pressure was 151/81, HR 101, RR 25 and 02 saturation 94%, on supplemental 02 per James [name], heart with S1 and S2 present and  regular with no gallops or rubs, positive JVD, lungs with diffuse rales bilaterally, abdomen with no distention and positive +++ lower extremity edema.   Chest radiograph with hyperinflation, with bilateral hilar vascular congestion, cephalization of the vasculature and bilateral central interstitial infiltrates, bilateral moderate pleural effusions.   EKG 102 bpm, left axis deviation, qtc 545, sinus rhythm with poor R R wave progression, with no significant ST segment changes and negative T wave V5 and V6.   Patient was placed on IV furosemide with improvement in his symptoms.  Weaned off bipap to nasal cannula.  11/29 hgb 6.7. transfused one unit PRBC.   11/30 using intermittent Bipap for dyspnea and increased work of breathing.  12/01 improving volume status and symptoms. Continue to have not controlled atrial fibrillation.   Assessment and Plan: * Acute on chronic diastolic CHF (congestive heart failure) (HCC) Echocardiogram with preserved LV systolic function with EF 60 to 65%, mild LVH, RV systolic function not well visualized. RVSP 26.8 mmHg. LA with moderate dilatation, RA mild dilatation, small pericardial effusion, no significant valvular disease.   Urine output 1,100 ml Systolic blood pressure 140 to 150 mmHg.   Plan to continue diuresis with furosemide IV 80 mg IV q 12 hrs.  Blood pressure control with clonidine.  Add Ted hose.   Acute on chronic hypoxemic respiratory failure due to acute cardiogenic pulmonary edema.  This am 02 saturation 97% on 3 L/min per  Using intermittent Bipap for dyspnea.  11/30 follow up chest radiograph with bilateral hilar vascular congestion with bilateral pleural effusions, more right than left.  Paroxysmal atrial fibrillation (HCC) Atrial fibrillation with RVR.  Telemetry with atrial fibrillation with rate 100 to 120 bpm.   Plan to increase metoprolol to 50 mg po bid, for better rate control atrial fibrillation.  Anticoagulation with  apixaban.   Hypertension Continue blood pressure control with clonidine.    CKD (chronic kidney disease) stage 4, GFR 15-29 ml/min (HCC) Hypokalemia.   Improved volume status, renal function with serum cr at 2,64 with K at 3,5 and serum bicarbonate at 26  Na 139 Mg 2.2   Continue diuresis with furosemide Add 40 meq Kcl to prevent hypokalemia.  Follow up renal function and electrolytes in am.   COPD (chronic obstructive pulmonary disease) (HCC) No signs of exacerbation. Continue with bronchodilator therapy.   Hyperlipidemia LDL goal <100 Continue statin therapy   Iron deficiency anemia Positive iron deficiency with serum iron 17, TIBC 360, transferrin saturation 5 and ferritin 30 Sp one unit PRBC transfusion  Follow up hgb is 8.2  Will add IV iron today.  Continue anticoagulation with apixaban, since there is no signs of acute bleeding.  Hold on aspirin.   Dementia (HCC) Continue with donepezil and sertraline.         Consultants: none Procedures performed: none   Disposition: Home Diet recommendation:  Cardiac diet DISCHARGE MEDICATION: Allergies as of 06/28/2023       Reactions   Plavix [clopidogrel Bisulfate] Other (See Comments)   GI Bleed   Uloric [febuxostat] Other (See Comments)   Unknown reaction        Medication List     STOP taking these medications    amLODipine-atorvastatin 10-40 MG tablet Commonly known as: CADUET   aspirin EC 81 MG tablet   ferrous sulfate 325 (65 FE) MG tablet   furosemide 80 MG tablet Commonly known as: LASIX       TAKE these medications    allopurinol 100 MG tablet Commonly known as: ZYLOPRIM TAKE 2 TABLETS DAILY   apixaban 5 MG Tabs tablet Commonly known as: ELIQUIS Take 1 tablet (5 mg total) by mouth 2 (two) times daily.   atorvastatin 40 MG tablet Commonly known as: LIPITOR Take 1 tablet (40 mg total) by mouth daily. Start taking on: June 29, 2023   cloNIDine 0.3 MG tablet Commonly known  as: CATAPRES Take 0.3 mg by mouth 3 (three) times daily.   donepezil 5 MG tablet Commonly known as: ARICEPT Take 1 tablet (5 mg total) by mouth at bedtime.   doxazosin 2 MG tablet Commonly known as: CARDURA Take 4 mg by mouth 2 (two) times daily.   ipratropium-albuterol 0.5-2.5 (3) MG/3ML Soln Commonly known as: DUONEB Take 3 mLs by nebulization every 6 (six) hours as needed.   metoprolol tartrate 50 MG tablet Commonly known as: LOPRESSOR Take 1 tablet (50 mg total) by mouth 2 (two) times daily. What changed:  medication strength how much to take   potassium chloride 10 MEQ CR capsule Commonly known as: MICRO-K Take 10 mEq by mouth daily.   sertraline 50 MG tablet Commonly known as: ZOLOFT Take 1 tablet (50 mg total) by mouth daily.   Torsemide 40 MG Tabs Take 80 mg by mouth 2 (two) times daily.   Vitamin D3 50 MCG (2000 UT) Tabs Take 2,000 Units by mouth daily.               Durable Medical Equipment  (From admission, onward)           Start  Ordered   06/28/23 0000  For home use only DME Nebulizer machine       Question Answer Comment  Patient needs a nebulizer to treat with the following condition COPD (chronic obstructive pulmonary disease) (HCC)   Length of Need 12 Months      06/28/23 1204            Discharge Exam: Filed Weights   06/26/23 1130 06/27/23 0500 06/28/23 0356  Weight: 80.5 kg 79.7 kg 79.2 kg   BP (!) 156/85   Pulse 100   Temp 97.8 F (36.6 C) (Oral)   Resp (!) 24   Ht 5\' 10"  (1.778 m)   Wt 79.2 kg   SpO2 96%   BMI 25.04 kg/m   Patient with no chest pain or dyspnea, lower extremity edema has improved.   Neurology awake and alert ENT with no pallor Cardiovascular with S1 and S2 present, irregularly irregular with no gallops, rubs or murmurs Respiratory with no wheezing, rales or rhonchi Abdomen with no distention  No lower extremity edema, Ted hose in place    Condition at discharge: stable  The results  of significant diagnostics from this hospitalization (including imaging, microbiology, ancillary and laboratory) are listed below for reference.   Imaging Studies: DG Chest 1 View  Result Date: 06/26/2023 CLINICAL DATA:  Shortness of breath and dyspnea EXAM: CHEST  1 VIEW COMPARISON:  X-ray 06/24/2023. FINDINGS: Stable cardiopericardial silhouette with calcified tortuous aorta. Persistent bilateral pleural effusions and lung opacities. Prominent central vasculature. Apical pleural thickening. Overlapping cardiac leads. Degenerative changes seen along the spine. IMPRESSION: No significant interval change when adjusting for technique. Electronically Signed   By: Karen Kays M.D.   On: 06/26/2023 12:50   DG Chest Portable 1 View  Result Date: 06/24/2023 CLINICAL DATA:  Shortness of breath and hypoxemia. EXAM: PORTABLE CHEST 1 VIEW COMPARISON:  Portable chest 01/18/2023 FINDINGS: The heart is enlarged. There is increased perihilar vascular congestion. There is mild-to-moderate interstitial edema with a basal gradient. Findings consistent with CHF or fluid overload. There are moderate pleural effusions and overlying patchy atelectasis or consolidation to the mid hilar levels. No focal infiltrate is seen in the upper zones. The mediastinum is stable with aortic atherosclerosis. No new osseous findings. Multilevel thoracic spine bridging enthesopathy. Biapical pleural capping appears similar. IMPRESSION: 1. Cardiomegaly with increased perihilar vascular congestion and mild-to-moderate interstitial edema with a basal gradient. Findings consistent with CHF or fluid overload. 2. Moderate pleural effusions and overlying patchy atelectasis or consolidation. 3. Aortic atherosclerosis. Electronically Signed   By: Almira Bar M.D.   On: 06/24/2023 23:06   ECHOCARDIOGRAM COMPLETE  Result Date: 06/09/2023    ECHOCARDIOGRAM REPORT   Patient Name:   JAXXTON STEIDINGER Date of Exam: 06/08/2023 Medical Rec #:   [id number]           Height:       70.0 in Accession #:    [id number]          Weight:       167.1 lb Date of Birth:  07-08-1946           BSA:          1.934 m Patient Age:    77 years            BP:           153/62 mmHg Patient Gender: M  HR:           75 bpm. Exam Location:  [address] Procedure: 2D Echo, Cardiac Doppler and Color Doppler Indications:    Atrial Fibrillation I48.91  History:        Patient has prior history of Echocardiogram examinations, most                 recent 01/17/2023. Risk Factors:Dyslipidemia, Hypertension and                 Sleep Apnea.  Sonographer:    Eulah Pont RDCS Referring Phys: [id number] JAMES HOCHREIN IMPRESSIONS  1. Left ventricular ejection fraction, by estimation, is 60 to 65%. The left ventricle has normal function. The left ventricle has no regional wall motion abnormalities. There is mild concentric left ventricular hypertrophy. Left ventricular diastolic function could not be evaluated.  2. Right ventricular systolic function was not well visualized. The right ventricular size is normal. There is normal pulmonary artery systolic pressure. The estimated right ventricular systolic pressure is 26.8 mmHg.  3. Left atrial size was moderately dilated.  4. Right atrial size was mildly dilated.  5. A small pericardial effusion is present. There is no evidence of cardiac tamponade.  6. The mitral valve is normal in structure. Mild mitral valve regurgitation. No evidence of mitral stenosis.  7. The aortic valve is tricuspid. There is mild calcification of the aortic valve. Aortic valve regurgitation is trivial. Aortic valve sclerosis/calcification is present, without any evidence of aortic stenosis.  8. The inferior vena cava is normal in size with greater than 50% respiratory variability, suggesting right atrial pressure of 3 mmHg. Comparison(s): Changes from prior study are noted. Pericardial effusion is smaller, LVEF is improved without wall motion  abnormalities. FINDINGS  Left Ventricle: Left ventricular ejection fraction, by estimation, is 60 to 65%. The left ventricle has normal function. The left ventricle has no regional wall motion abnormalities. The left ventricular internal cavity size was normal in size. There is  mild concentric left ventricular hypertrophy. Left ventricular diastolic function could not be evaluated due to atrial fibrillation. Left ventricular diastolic function could not be evaluated. Right Ventricle: The right ventricular size is normal. Right vetricular wall thickness was not well visualized. Right ventricular systolic function was not well visualized. There is normal pulmonary artery systolic pressure. The tricuspid regurgitant velocity is 2.44 m/s, and with an assumed right atrial pressure of 3 mmHg, the estimated right ventricular systolic pressure is 26.8 mmHg. Left Atrium: Left atrial size was moderately dilated. Right Atrium: Right atrial size was mildly dilated. Pericardium: A small pericardial effusion is present. There is no evidence of cardiac tamponade. Mitral Valve: The mitral valve is normal in structure. Mild mitral valve regurgitation. No evidence of mitral valve stenosis. Tricuspid Valve: The tricuspid valve is normal in structure. Tricuspid valve regurgitation is trivial. No evidence of tricuspid stenosis. Aortic Valve: The aortic valve is tricuspid. There is mild calcification of the aortic valve. Aortic valve regurgitation is trivial. Aortic valve sclerosis/calcification is present, without any evidence of aortic stenosis. Pulmonic Valve: The pulmonic valve was not well visualized. Pulmonic valve regurgitation is trivial. No evidence of pulmonic stenosis. Aorta: The aortic root, ascending aorta and aortic arch are all structurally normal, with no evidence of dilitation or obstruction. Venous: The inferior vena cava is normal in size with greater than 50% respiratory variability, suggesting right atrial pressure  of 3 mmHg. IAS/Shunts: The atrial septum is grossly normal.  LEFT VENTRICLE PLAX 2D LVIDd:  5.20 cm LVIDs:         3.40 cm LV PW:         1.20 cm LV IVS:        1.30 cm LVOT diam:     2.10 cm LV SV:         76 LV SV Index:   39 LVOT Area:     3.46 cm  LV Volumes (MOD) LV vol d, MOD A2C: 107.0 ml LV vol d, MOD A4C: 112.0 ml LV vol s, MOD A2C: 41.7 ml LV vol s, MOD A4C: 45.6 ml LV SV MOD A2C:     65.3 ml LV SV MOD A4C:     112.0 ml LV SV MOD BP:      67.5 ml RIGHT VENTRICLE RV S prime:     6.66 cm/s TAPSE (M-mode): 0.9 cm LEFT ATRIUM             Index        RIGHT ATRIUM           Index LA diam:        4.70 cm 2.43 cm/m   RA Area:     22.20 cm LA Vol (A2C):   72.0 ml 37.23 ml/m  RA Volume:   57.50 ml  29.73 ml/m LA Vol (A4C):   66.6 ml 34.44 ml/m LA Biplane Vol: 73.5 ml 38.01 ml/m  AORTIC VALVE LVOT Vmax:   100.30 cm/s LVOT Vmean:  70.533 cm/s LVOT VTI:    0.218 m  AORTA Ao Root diam: 3.00 cm Ao Asc diam:  3.60 cm MR Peak grad:    89.5 mmHg    TRICUSPID VALVE MR Mean grad:    66.5 mmHg    TR Peak grad:   23.8 mmHg MR Vmax:         473.00 cm/s  TR Vmax:        244.00 cm/s MR Vmean:        396.5 cm/s MR PISA:         0.57 cm     SHUNTS MR PISA Eff ROA: 5 mm        Systemic VTI:  0.22 m MR PISA Radius:  0.30 cm      Systemic Diam: 2.10 cm Jodelle Red MD Electronically signed by Jodelle Red MD Signature Date/Time: 06/09/2023/8:12:01 PM    Final     Microbiology: Results for orders placed or performed during the hospital encounter of 01/15/23  Culture, blood (Routine x 2)     Status: None   Collection Time: 01/15/23  6:01 PM   Specimen: BLOOD  Result Value Ref Range Status   Specimen Description BLOOD RIGHT ANTECUBITAL  Final   Special Requests   Final    BOTTLES DRAWN AEROBIC AND ANAEROBIC Blood Culture results may not be optimal due to an inadequate volume of blood received in culture bottles   Culture   Final    NO GROWTH 5 DAYS Performed at [facility], [address] [place], [place] [address]    Report Status 01/20/2023 FINAL  Final  Culture, blood (Routine x 2)     Status: None   Collection Time: 01/15/23  6:05 PM   Specimen: BLOOD  Result Value Ref Range Status   Specimen Description BLOOD BLOOD RIGHT FOREARM  Final   Special Requests   Final    BOTTLES DRAWN AEROBIC AND ANAEROBIC Blood Culture adequate volume   Culture   Final    NO GROWTH  5 DAYS Performed at [facility], [address] [place], [place] [address]    Report Status 01/20/2023 FINAL  Final  Respiratory (~20 pathogens) panel by PCR     Status: None   Collection Time: 01/16/23  1:22 AM   Specimen: Nasopharyngeal Swab; Respiratory  Result Value Ref Range Status   Adenovirus NOT DETECTED NOT DETECTED Final   Coronavirus 229E NOT DETECTED NOT DETECTED Final    Comment: (NOTE) The Coronavirus on the Respiratory Panel, DOES NOT test for the novel  Coronavirus (2019 nCoV)    Coronavirus HKU1 NOT DETECTED NOT DETECTED Final   Coronavirus NL63 NOT DETECTED NOT DETECTED Final   Coronavirus OC43 NOT DETECTED NOT DETECTED Final   Metapneumovirus NOT DETECTED NOT DETECTED Final   Rhinovirus / Enterovirus NOT DETECTED NOT DETECTED Final   Influenza A NOT DETECTED NOT DETECTED Final   Influenza B NOT DETECTED NOT DETECTED Final   Parainfluenza Virus 1 NOT DETECTED NOT DETECTED Final   Parainfluenza Virus 2 NOT DETECTED NOT DETECTED Final   Parainfluenza Virus 3 NOT DETECTED NOT DETECTED Final   Parainfluenza Virus 4 NOT DETECTED NOT DETECTED Final   Respiratory Syncytial Virus NOT DETECTED NOT DETECTED Final   Bordetella pertussis NOT DETECTED NOT DETECTED Final   Bordetella Parapertussis NOT DETECTED NOT DETECTED Final   Chlamydophila pneumoniae NOT DETECTED NOT DETECTED Final   Mycoplasma pneumoniae NOT DETECTED NOT DETECTED Final    Comment: Performed at [facility], [address] [place], [place] [address]  SARS Coronavirus 2 by RT PCR (hospital order, performed in [facility] hospital lab) *cepheid single result test*     Status: None   Collection Time: 01/16/23  1:22 AM  Result Value Ref Range Status   SARS Coronavirus 2 by RT PCR NEGATIVE NEGATIVE Final    Comment: Performed at [facility], [address] [place], [place] [address]    Labs: CBC: Recent Labs  Lab 06/24/23 2241 06/24/23 2254 06/25/23 0822 06/25/23 2005 06/26/23 0327 06/27/23 0447  WBC 14.9*  --  4.7  --  8.8  --   NEUTROABS 13.6*  --   --   --   --   --   HGB 7.2* 8.2* 6.7* 7.9* 8.2* 8.1*  HCT 23.4* 24.0* 21.6* 25.1* 26.6* 26.5*  MCV 92.5  --  90.4  --  89.3  --   PLT 273  --  214  --  266  --    Basic Metabolic Panel: Recent Labs  Lab 06/24/23 2241 06/24/23 2254 06/25/23 0822 06/26/23 0327 06/27/23 0447 06/28/23 0648  NA 135 137 139 138 139 141  K 3.7 3.8 3.6 3.4* 3.5 3.2*  CL 101  --  104 102 105 103  CO2 24  --  22 20* 26 25  GLUCOSE 199*  --  182* 155* 119* 115*  BUN 45*  --  45* 52* 49* 37*  CREATININE 2.83*  --  2.79* 2.92* 2.64* 2.46*  CALCIUM 8.1*  --  8.2* 8.8* 8.5* 8.4*  MG  --   --  2.1  --  2.2 2.1  PHOS  --   --  4.9*  --   --   --    Liver Function Tests: Recent Labs  Lab 06/24/23 2241  AST 31  ALT 18  ALKPHOS 138*  BILITOT 1.1  PROT 5.9*  ALBUMIN 3.1*   CBG: Recent Labs  Lab 06/27/23 1218 06/27/23 1637 06/27/23 2050 06/28/23 0705 06/28/23 1136  GLUCAP 108* 105* 140* 110* 92    Discharge time spent: greater than 30 minutes.  Signed: Coralie Keens, MD [facility] 06/28/2023

## 2023-06-28 NOTE — TOC Progression Note (Signed)
Transition of Care [facility]) - Progression Note    Patient Details  Name: James [name] MRN: [id number] Date of Birth: 10-28-1945  Transition of [facility] CM/SW Contact  Huston Foley Jacklynn Ganong, RN Phone Number: 06/28/2023, 2:34 PM  Clinical Narrative:    Patient declines Home Health, per son he will not allow anyone on his property. Son advised to contact primary MD to get outpatient therapy arranged, his dad wants go home now. Bedside RN has prepared for discharge.   Expected Discharge Plan: Home/Self Care Barriers to Discharge: No Barriers Identified  Expected Discharge Plan and Services   Discharge Planning Services: CM Consult   Living arrangements for the past 2 months: Single Family Home Expected Discharge Date: 06/28/23               DME Arranged: Nebulizer/meds DME Agency: [employer] Date DME Agency Contacted: 06/28/23 Time DME Agency Contacted: 1346 Representative spoke with at DME Agency: Vaughan Basta HH Arranged: Refused HH           Social Determinants of Health (SDOH) Interventions SDOH Screenings   Food Insecurity: No Food Insecurity (06/25/2023)  Housing: Low Risk  (06/25/2023)  Transportation Needs: No Transportation Needs (06/25/2023)  Utilities: Not At Risk (06/25/2023)  Depression (PHQ2-9): Low Risk  (04/01/2023)  Financial Resource Strain: Patient Declined (03/21/2023)  Physical Activity: Insufficiently Active (03/21/2023)  Social Connections: Unknown (03/21/2023)  Stress: No Stress Concern Present (03/21/2023)  Tobacco Use: Medium Risk (06/24/2023)    Readmission Risk Interventions     No data to display

## 2023-06-28 NOTE — Plan of Care (Signed)
  Problem: Nutrition: Goal: Adequate nutrition will be maintained 06/28/2023 1429 by Sanda Linger, RN Outcome: Completed/Met 06/28/2023 1427 by Sanda Linger, RN Outcome: Adequate for Discharge   Problem: Coping: Goal: Level of anxiety will decrease 06/28/2023 1429 by Sanda Linger, RN Outcome: Completed/Met 06/28/2023 1427 by Sanda Linger, RN Outcome: Adequate for Discharge   Problem: Elimination: Goal: Will not experience complications related to bowel motility 06/28/2023 1429 by Sanda Linger, RN Outcome: Completed/Met 06/28/2023 1427 by Sanda Linger, RN Outcome: Adequate for Discharge   Problem: Elimination: Goal: Will not experience complications related to bowel motility 06/28/2023 1429 by Sanda Linger, RN Outcome: Completed/Met 06/28/2023 1427 by Sanda Linger, RN Outcome: Adequate for Discharge Goal: Will not experience complications related to urinary retention Outcome: Completed/Met   Problem: Elimination: Goal: Will not experience complications related to urinary retention Outcome: Completed/Met   Problem: Pain Management: Goal: General experience of comfort will improve Outcome: Completed/Met   Problem: Safety: Goal: Ability to remain free from injury will improve Outcome: Completed/Met   Problem: Skin Integrity: Goal: Risk for impaired skin integrity will decrease Outcome: Completed/Met

## 2023-06-29 ENCOUNTER — Telehealth: Payer: Self-pay | Admitting: *Deleted

## 2023-06-29 NOTE — Transitions of Care (Post Inpatient/ED Visit) (Signed)
06/29/2023  Name: James [name] MRN: [id number] DOB: [birth year]  Today's TOC FU Call Status: Today's TOC FU Call Status:: Successful TOC FU Call Completed TOC FU Call Complete Date: 06/29/23 Patient's Name and Date of Birth confirmed. Patient declined enrollment in 30 day program  Transition Care Management Follow-up Telephone Call Date of Discharge: 06/28/23 Discharge Facility: [facility]) Type of Discharge: Inpatient Admission Primary Inpatient Discharge Diagnosis:: Acute on chronic diastolic CHF (congestive heart failure) How have you been since you were released from the hospital?:  (pt states "I'm feeling good", eating well, no complaints, states son and spouse live in the home and assist as needed, pt states he is weighing daily with weight today 173 pounds) Any questions or concerns?: No  Items Reviewed: Did you receive and understand the discharge instructions provided?: Yes Medications obtained,verified, and reconciled?: Partial Review Completed Reason for Partial Mediation Review: pt states son "looks after all medicine"  son is not available to speak and pt does not give permission for RN care manager to contact son citing he has alot going on, states spouse is home "but she knows nothing about the medicine"  pt states he is sure he has all medications or his son would have mentioned it. Any new allergies since your discharge?: No Dietary orders reviewed?: Yes Type of Diet Ordered:: low sodium  heart healthy Do you have support at home?: Yes People in Home: spouse, child(ren), adult Name of Support/Comfort Primary Source: adult son James [name]     spouse  James [name]  Medications Reviewed Today: Medications Reviewed Today     Reviewed by Audrie Gallus, RN (Registered Nurse) on 06/29/23 at 1033  Med List Status: <None>   Medication Order Taking? Sig Documenting Provider Last Dose Status Informant  allopurinol (ZYLOPRIM) 100 MG tablet [id number]  Yes TAKE 2 TABLETS DAILY [name], [name], MD Taking Active Spouse/Significant Other, Child, Pharmacy Records  apixaban (ELIQUIS) 5 MG TABS tablet [id number] No Take 1 tablet (5 mg total) by mouth 2 (two) times daily. Eustace Pen, PA-C Unknown Active Child, Pharmacy Records, Spouse/Significant Other  atorvastatin (LIPITOR) 40 MG tablet [id number] No Take 1 tablet (40 mg total) by mouth daily. Arrien, York Ram, MD Unknown Active   Cholecalciferol (VITAMIN D3) 50 MCG (2000 UT) TABS [id number] No Take 2,000 Units by mouth daily. [provider] Unknown Active Pharmacy Records, Child, Spouse/Significant Other  cloNIDine (CATAPRES) 0.3 MG tablet [id number] No Take 0.3 mg by mouth 3 (three) times daily. [provider] Unknown Active Pharmacy Records, Child, Spouse/Significant Other  donepezil (ARICEPT) 5 MG tablet [id number] No Take 1 tablet (5 mg total) by mouth at bedtime. [name], [name], MD Unknown Active Spouse/Significant Other, Child, Pharmacy Records  doxazosin (CARDURA) 2 MG tablet [id number] No Take 4 mg by mouth 2 (two) times daily. [provider] Unknown Active Pharmacy Records, Child, Spouse/Significant Other  ipratropium-albuterol (DUONEB) 0.5-2.5 (3) MG/3ML SOLN [id number] Yes Take 3 mLs by nebulization every 6 (six) hours as needed. Arrien, York Ram, MD Taking Active   metoprolol tartrate (LOPRESSOR) 50 MG tablet [id number] No Take 1 tablet (50 mg total) by mouth 2 (two) times daily. Arrien, York Ram, MD Unknown Active   potassium chloride (MICRO-K) 10 MEQ CR capsule [id number] No Take 10 mEq by mouth daily. [provider] Unknown Active Pharmacy Records, Child, Spouse/Significant Other  sertraline (ZOLOFT) 50 MG tablet [id number] No Take 1 tablet (50 mg total) by mouth daily. [name], [name], MD Unknown Active Spouse/Significant  Other, Child, Pharmacy Records  torsemide 40 MG TABS [id number] No Take 80 mg by mouth 2 (two) times  daily. Arrien, York Ram, MD Unknown Active             Home Care and Equipment/Supplies: Were Home Health Services Ordered?: No Any new equipment or medical supplies ordered?: Yes Name of Medical supply agency?:  (pt is unsure name of medical supply agency) Were you able to get the equipment/medical supplies?: Yes Do you have any questions related to the use of the equipment/supplies?: No  Functional Questionnaire: Do you need assistance with bathing/showering or dressing?: No Do you need assistance with meal preparation?: No Do you need assistance with eating?: No Do you have difficulty maintaining continence: No Do you need assistance with getting out of bed/getting out of a chair/moving?: No Do you have difficulty managing or taking your medications?: Yes (son provides oversight with medications)  Follow up appointments reviewed: PCP Follow-up appointment confirmed?: No (pt does not have post hospital appt scheduled, pt states his son will make this appt today, declines for nurse to schedule as it has to be when son can take pt) MD Provider Line [phone] Given: No Date of PCP follow-up appointment?: 08/09/23 Follow-up Provider: Dr. Ivin Booty [name] @ 325 pm Specialist Hospital Follow-up appointment confirmed?: No Reason Specialist Follow-Up Not Confirmed: Patient has Specialist Provider Number and will Call for Appointment (pt states his son will call for all follow up appointments) Do you need transportation to your follow-up appointment?: No (son provides transportation) Do you understand care options if your condition(s) worsen?: Yes-patient verbalized understanding  SDOH Interventions Today    Flowsheet Row Most Recent Value  SDOH Interventions   Food Insecurity Interventions Intervention Not Indicated  Housing Interventions Intervention Not Indicated  Transportation Interventions Intervention Not Indicated  Utilities Interventions Intervention Not  Indicated       [name] [facility], BSN RN Care Manager/ Transition of Care [place]/ [facility] [phone]

## 2023-07-01 ENCOUNTER — Encounter: Payer: Self-pay | Admitting: Cardiology

## 2023-07-01 ENCOUNTER — Encounter: Payer: Self-pay | Admitting: Family Medicine

## 2023-07-05 NOTE — Telephone Encounter (Signed)
Attempted to call patients son unable to get in touch with anyone. Will offer an appointment 07/09/23 at 4:30 pm.

## 2023-07-07 ENCOUNTER — Encounter: Payer: Self-pay | Admitting: Family Medicine

## 2023-07-07 ENCOUNTER — Ambulatory Visit: Payer: Medicare Other | Admitting: Family Medicine

## 2023-07-07 VITALS — BP 148/57 | HR 93 | Temp 97.3°F | Resp 20 | Ht 70.0 in | Wt 177.0 lb

## 2023-07-07 DIAGNOSIS — I5033 Acute on chronic diastolic (congestive) heart failure: Secondary | ICD-10-CM | POA: Diagnosis not present

## 2023-07-07 DIAGNOSIS — D649 Anemia, unspecified: Secondary | ICD-10-CM | POA: Diagnosis not present

## 2023-07-07 MED ORDER — METOPROLOL TARTRATE 50 MG PO TABS: 50.0000 mg | ORAL_TABLET | Freq: Two times a day (BID) | ORAL | 3 refills | Status: DC

## 2023-07-07 MED ORDER — AMLODIPINE-ATORVASTATIN 10-40 MG PO TABS: 1.0000 | ORAL_TABLET | Freq: Every day | ORAL | 3 refills | Status: DC

## 2023-07-07 MED ORDER — IRON (FERROUS SULFATE) 325 (65 FE) MG PO TABS: 325.0000 mg | ORAL_TABLET | Freq: Every day | ORAL | 1 refills | Status: DC

## 2023-07-07 MED ORDER — TORSEMIDE 20 MG PO TABS: 80.0000 mg | ORAL_TABLET | Freq: Two times a day (BID) | ORAL | 0 refills | Status: DC

## 2023-07-07 NOTE — Progress Notes (Signed)
BP (!) 148/57   Pulse 93   Temp (!) 97.3 F (36.3 C) (Temporal)   Resp 20   Ht 5\' 10"  (1.778 m)   Wt 177 lb (80.3 kg)   SpO2 94%   BMI 25.40 kg/m    Subjective:   Patient ID: James [name], male    DOB: 30-Aug-1945, 77 y.o.   MRN: [id number]  HPI: James [name] is a 77 y.o. male presenting on 07/07/2023 for Hospitalization Follow-up   HPI Hospital follow-up/TOC visit Patient was contacted by transition of care and nurse [name], RN on 06/29/2023. Patient was admitted to the hospital on 06/24/2023 and discharged on 06/28/2023.  Patient was admitted for acute on chronic diastolic CHF.  Patient was switched from furosemide to torsemide and increase his metoprolol for rate control and held his amlodipine to avoid hypotension for the moment.  Patient was found short of breath and respiratory distress at home and his O2 saturation was 53% on 5 L nasal cannula per EMS.  Since in the hospital he is doing better.  He did increase his metoprolol but he had not gotten the torsemide because it was sent in way that was very expensive.  Will have to do a lower dose and take it more milligrams.  He is seeing cardiology instructed to have a discussion with them to see if they can increase to 100.  The torsemide 40s were too expensive.  20s or 100s are the generic.  They had instructed him to possibly stop the amlodipine with the increase in the diuretic but he still been taking his normal amlodipine and his blood pressure looks fine.  We are going to add back the torsemide instead of furosemide so he will monitor blood pressure closely at home and if it starts to dip then we will reduce the amlodipine.  He did start taking back his aspirin and his iron and says that he has dark stools but does not know if that is from the iron.  He is not witnessed any blood.  Relevant past medical, surgical, family and social history reviewed and updated as indicated. Interim medical history since our last  visit reviewed. Allergies and medications reviewed and updated.  Review of Systems  Constitutional:  Negative for chills and fever.  Eyes:  Negative for visual disturbance.  Respiratory:  Negative for cough, shortness of breath and wheezing.   Cardiovascular:  Negative for chest pain, palpitations and leg swelling.  Gastrointestinal:  Negative for anal bleeding, blood in stool, constipation and diarrhea.  Genitourinary:  Negative for hematuria.  Musculoskeletal:  Negative for back pain and gait problem.  Skin:  Negative for rash.  Neurological:  Negative for dizziness, weakness and light-headedness.  All other systems reviewed and are negative.   Per HPI unless specifically indicated above   Allergies as of 07/07/2023       Reactions   Plavix [clopidogrel Bisulfate] Other (See Comments)   GI Bleed   Uloric [febuxostat] Other (See Comments)   Unknown reaction        Medication List        Accurate as of July 07, 2023  9:52 AM. If you have any questions, ask your nurse or doctor.          STOP taking these medications    atorvastatin 40 MG tablet Commonly known as: LIPITOR Stopped by: [name] Brette Cast       TAKE these medications    allopurinol 100 MG tablet Commonly  known as: ZYLOPRIM TAKE 2 TABLETS DAILY   amLODipine-atorvastatin 10-40 MG tablet Commonly known as: Caduet Take 1 tablet by mouth daily. Started by: [name] Sheyna Pettibone   apixaban 5 MG Tabs tablet Commonly known as: ELIQUIS Take 1 tablet (5 mg total) by mouth 2 (two) times daily.   cloNIDine 0.3 MG tablet Commonly known as: CATAPRES Take 0.3 mg by mouth 3 (three) times daily.   donepezil 5 MG tablet Commonly known as: ARICEPT Take 1 tablet (5 mg total) by mouth at bedtime.   doxazosin 2 MG tablet Commonly known as: CARDURA Take 4 mg by mouth 2 (two) times daily.   ipratropium-albuterol 0.5-2.5 (3) MG/3ML Soln Commonly known as: DUONEB Take 3 mLs by nebulization every 6  (six) hours as needed.   Iron (Ferrous Sulfate) 325 (65 Fe) MG Tabs Take 325 mg by mouth daily. Started by: [name] Ashlinn Hemrick   metoprolol tartrate 50 MG tablet Commonly known as: LOPRESSOR Take 1 tablet (50 mg total) by mouth 2 (two) times daily.   potassium chloride 10 MEQ CR capsule Commonly known as: MICRO-K Take 10 mEq by mouth daily.   sertraline 50 MG tablet Commonly known as: ZOLOFT Take 1 tablet (50 mg total) by mouth daily.   torsemide 20 MG tablet Commonly known as: DEMADEX Take 4 tablets (80 mg total) by mouth 2 (two) times daily. What changed: medication strength Changed by: [name] Mileah Hemmer   Vitamin D3 50 MCG (2000 UT) Tabs Take 2,000 Units by mouth daily.         Objective:   BP (!) 148/57   Pulse 93   Temp (!) 97.3 F (36.3 C) (Temporal)   Resp 20   Ht 5\' 10"  (1.778 m)   Wt 177 lb (80.3 kg)   SpO2 94%   BMI 25.40 kg/m   Wt Readings from Last 3 Encounters:  07/07/23 177 lb (80.3 kg)  06/28/23 174 lb 8 oz (79.2 kg)  06/15/23 181 lb 1.6 oz (82.1 kg)    Physical Exam Vitals and nursing note reviewed.  Constitutional:      General: He is not in acute distress.    Appearance: He is well-developed. He is not diaphoretic.  Eyes:     General: No scleral icterus.    Conjunctiva/sclera: Conjunctivae normal.  Neck:     Thyroid: No thyromegaly.  Cardiovascular:     Rate and Rhythm: Normal rate. Rhythm irregular.     Heart sounds: Normal heart sounds. No murmur heard. Pulmonary:     Effort: Pulmonary effort is normal. No respiratory distress.     Breath sounds: Normal breath sounds. No wheezing, rhonchi or rales.  Musculoskeletal:        General: Normal range of motion.     Cervical back: Neck supple.  Lymphadenopathy:     Cervical: No cervical adenopathy.  Skin:    General: Skin is warm and dry.     Findings: Bruising present. No rash.  Neurological:     Mental Status: He is alert and oriented to person, place, and time.      Coordination: Coordination normal.  Psychiatric:        Behavior: Behavior normal.       Assessment & Plan:   Problem List Items Addressed This Visit       Cardiovascular and Mediastinum   Acute on chronic diastolic CHF (congestive heart failure) (HCC) - Primary   Relevant Medications   metoprolol tartrate (LOPRESSOR) 50 MG tablet   amLODipine-atorvastatin (CADUET)  10-40 MG tablet   torsemide (DEMADEX) 20 MG tablet   Other Relevant Orders   CBC with Differential/Platelet   CMP14+EGFR   Brain natriuretic peptide   Other Visit Diagnoses     Anemia, unspecified type       Relevant Medications   Iron, Ferrous Sulfate, 325 (65 Fe) MG TABS   Other Relevant Orders   CBC with Differential/Platelet   CMP14+EGFR   Fecal occult blood, imunochemical     Patient never got the torsemide but he seems to be doing better, will send some torsemide in for him locally that he can do instead of the furosemide and then he follows up with cardiology on Friday.  He has been taking his amlodipine and his blood pressure looks okay.  He will monitor his blood pressure at home when he starts the torsemide and if it starts to go low then we will back off on the amlodipine.  He has been taking his iron as well so continue with that.  He has been taking an aspirin, he was seen cardiology Friday and recommended that he talk about that with him.  Follow up plan: Return if symptoms worsen or fail to improve.  Counseling provided for all of the vaccine components Orders Placed This Encounter  Procedures   Fecal occult blood, imunochemical   CBC with Differential/Platelet   CMP14+EGFR   Brain natriuretic peptide    Arville Care, MD [place] [facility] 07/07/2023, 9:52 AM

## 2023-07-07 NOTE — Progress Notes (Signed)
Cardiology Office Note:   Date:  07/09/2023  ID:  James [name], DOB 1946-04-10, MRN [id number] PCP: [name], [name], MD  [facility] Providers Cardiologist:  Rollene Rotunda, MD {  History of Present Illness:   James [name] is a 77 y.o. male who presents for evaluation of fatigue and difficult to control hypertension and a large pericardial effusion. I followed this for a long while but the effusion became larger and he had elective pericardial window in March 2023.      He had atrial fib and had cardioversion on 11/18/22.  Follow-up echo earlier this month demonstrated very small pericardial effusion.  He had well-preserved ejection fraction.    Since I last saw him he was in the hospital with acute respiratory failure.  I reviewed these records for this visit.  He was added to my schedule today because of this.  It turns out he probably ate a little more salt as this was right before Thanksgiving and they had their dinner in the days before.  He says he did not have any increased swelling.  He always has some chronic lower extremity swelling.  He did not think his weight was increased.  He did not think he had any PND or orthopnea or symptoms until he suddenly got short of breath.  He did not have any chest pressure, neck or arm discomfort.  He was not having any new palpitations, presyncope or syncope.  In the hospital he was diuresed with IV diuresis.  Because of his swelling he was supposed to be sent home without the amlodipine part of his Caduet.  He was also switched from Lasix to torsemide.  However, they wrote for 80 mg of torsemide BID and this prescription was going to cost $1200 so they did not do this.  He saw his primary provider the other day and they wrote for the 20 mg tablets x 4 daily which was very cheap.  The patient also did not change his Caduet.  He is still taking that.  His blood pressures have been in the 140s systolic at home.  He is breathing  better.    He was also instructed to stop his aspirin as he is on Eliquis.  They were going to stop his iron as well but he continued this.  His hemoglobin went down to 6.7 and he required transfusion.  He was noted to be iron deficient.  ROS: As stated in the HPI and negative for all other systems.  Studies Reviewed:    EKG:   Atrial fibrillation, rate 105, poor anterior R wave progression, nonspecific T wave changes, premature ectopic complexes.  Risk Assessment/Calculations:    CHA2DS2-VASc Score = 4   This indicates a 4.8% annual risk of stroke. The patient's score is based upon: CHF History: 0 HTN History: 1 Diabetes History: 0 Stroke History: 0 Vascular Disease History: 1 Age Score: 2 Gender Score: 0    Physical Exam:   VS:  BP (!) 144/68 (BP Location: Left Arm, Patient Position: Sitting, Cuff Size: Normal)   Pulse 73   Ht 5\' 10"  (1.778 m)   Wt 178 lb (80.7 kg)   SpO2 99%   BMI 25.54 kg/m    Wt Readings from Last 3 Encounters:  07/09/23 178 lb (80.7 kg)  07/07/23 177 lb (80.3 kg)  06/28/23 174 lb 8 oz (79.2 kg)     GEN: Well nourished, well developed in no acute distress NECK: No JVD; No carotid  bruits CARDIAC: Irregular RR, no murmurs, rubs, gallops RESPIRATORY:  Clear to auscultation without rales, wheezing or rhonchi  ABDOMEN: Soft, non-tender, non-distended EXTREMITIES: Severe bilateral leg edema; No deformity   ASSESSMENT AND PLAN:   PERICARDIAL EFFUSION:   This was small on previous follow-up echo in November.  No change in therapy.   HTN:  His BP is mildly elevated but he is on maximal medical therapy and this is much improved from previous.  No change in therapy.   RAS:   His creatinine is up to 2.46.  He is followed by nephrology.  Given his bump in his creatinine we are going to have to watch this very closely.  I will ask Dr. Louanne Skye to get another basic metabolic profile in a couple of weeks.  He follows with Dr. Myra Gianotti.     CAROTID STENOSIS:  This has been mild.  No change in therapy.=   ATRIAL FIB:   He tolerates his anticoagulation.  No change in therapy.  ANEMIA: Reading through the hospital notes there was no sign of acute bleeding.  I am going to suggest he continue his iron and check back with his primary provider.  I have stopped the aspirin.  He needs follow-up CBCs.  ACUTE DIASTOLIC HF: The patient has acute diastolic heart failure.  He was sent home on torsemide but only took a couple of days of this.  I am worried about him taking 80 mg twice a day so I am going to change him to 80 mg of torsemide once daily.  Will have to follow his volume very closely.  I will last Dr. Louanne Skye check to be met again in about 10 days.     Follow up with APP in two months.   Signed, Rollene Rotunda, MD

## 2023-07-08 ENCOUNTER — Telehealth: Payer: Self-pay | Admitting: *Deleted

## 2023-07-08 LAB — CBC WITH DIFFERENTIAL/PLATELET
Basophils Absolute: 0 10*3/uL (ref 0.0–0.2)
Basos: 1 %
EOS (ABSOLUTE): 0.1 10*3/uL (ref 0.0–0.4)
Eos: 2 %
Hematocrit: 29.9 % — ABNORMAL LOW (ref 37.5–51.0)
Hemoglobin: 9.4 g/dL — ABNORMAL LOW (ref 13.0–17.7)
Immature Grans (Abs): 0 10*3/uL (ref 0.0–0.1)
Immature Granulocytes: 0 %
Lymphocytes Absolute: 0.6 10*3/uL — ABNORMAL LOW (ref 0.7–3.1)
Lymphs: 9 %
MCH: 28.1 pg (ref 26.6–33.0)
MCHC: 31.4 g/dL — ABNORMAL LOW (ref 31.5–35.7)
MCV: 90 fL (ref 79–97)
Monocytes Absolute: 0.4 10*3/uL (ref 0.1–0.9)
Monocytes: 6 %
Neutrophils Absolute: 5.6 10*3/uL (ref 1.4–7.0)
Neutrophils: 82 %
Platelets: 261 10*3/uL (ref 150–450)
RBC: 3.34 x10E6/uL — ABNORMAL LOW (ref 4.14–5.80)
RDW: 13 % (ref 11.6–15.4)
WBC: 6.9 10*3/uL (ref 3.4–10.8)

## 2023-07-08 LAB — CMP14+EGFR
ALT: 14 IU/L (ref 0–44)
AST: 16 IU/L (ref 0–40)
Albumin: 4 g/dL (ref 3.8–4.8)
Alkaline Phosphatase: 242 IU/L — ABNORMAL HIGH (ref 44–121)
BUN/Creatinine Ratio: 14 (ref 10–24)
BUN: 36 mg/dL — ABNORMAL HIGH (ref 8–27)
Bilirubin Total: 0.5 mg/dL (ref 0.0–1.2)
CO2: 24 mmol/L (ref 20–29)
Calcium: 9.2 mg/dL (ref 8.6–10.2)
Chloride: 97 mmol/L (ref 96–106)
Creatinine, Ser: 2.58 mg/dL — ABNORMAL HIGH (ref 0.76–1.27)
Globulin, Total: 2.4 g/dL (ref 1.5–4.5)
Glucose: 112 mg/dL — ABNORMAL HIGH (ref 70–99)
Potassium: 3.4 mmol/L — ABNORMAL LOW (ref 3.5–5.2)
Sodium: 140 mmol/L (ref 134–144)
Total Protein: 6.4 g/dL (ref 6.0–8.5)
eGFR: 25 mL/min/{1.73_m2} — ABNORMAL LOW (ref >59)

## 2023-07-08 LAB — BRAIN NATRIURETIC PEPTIDE: BNP: 859.1 pg/mL — ABNORMAL HIGH (ref 0.0–100.0)

## 2023-07-08 NOTE — Telephone Encounter (Signed)
LMOVM requesting call back to reschedule 4:30pm appointment on 07/09/23 to an earlier time per Dr. Antoine Poche (ok to double-book in the morning whenever convenient for pt). Direct number provided for call back.

## 2023-07-09 ENCOUNTER — Encounter: Payer: Self-pay | Admitting: Cardiology

## 2023-07-09 ENCOUNTER — Ambulatory Visit: Payer: Medicare Other | Attending: Cardiology | Admitting: Cardiology

## 2023-07-09 VITALS — BP 144/68 | HR 73 | Ht 70.0 in | Wt 178.0 lb

## 2023-07-09 DIAGNOSIS — I10 Essential (primary) hypertension: Secondary | ICD-10-CM

## 2023-07-09 DIAGNOSIS — I6529 Occlusion and stenosis of unspecified carotid artery: Secondary | ICD-10-CM

## 2023-07-09 DIAGNOSIS — I5033 Acute on chronic diastolic (congestive) heart failure: Secondary | ICD-10-CM | POA: Diagnosis not present

## 2023-07-09 DIAGNOSIS — I3139 Other pericardial effusion (noninflammatory): Secondary | ICD-10-CM

## 2023-07-09 DIAGNOSIS — I701 Atherosclerosis of renal artery: Secondary | ICD-10-CM

## 2023-07-09 MED ORDER — TORSEMIDE 20 MG PO TABS: 80.0000 mg | ORAL_TABLET | Freq: Every day | ORAL | 0 refills | Status: DC

## 2023-07-09 NOTE — Telephone Encounter (Signed)
LMOVM requesting return call.  Able to reach pt at home number. He wishes to keep his appointment today at 4:30pm as he is not able to come any earlier in the day. Pt is aware that there may be some delays due to scheduling error. He expressed appreciation of call and denies questions at this time.

## 2023-07-09 NOTE — Patient Instructions (Signed)
Medication Instructions:  [facility] (20 mg) take 4 tablets once daily. *If you need a refill on your cardiac medications before your next appointment, please call your pharmacy*  Follow-Up: At [facility], you and your health needs are our priority.  As part of our continuing mission to provide you with exceptional heart care, we have created designated Provider Care Teams.  These Care Teams include your primary Cardiologist (physician) and Advanced Practice Providers (APPs -  Physician Assistants and Nurse Practitioners) who all work together to provide you with the care you need, when you need it.  We recommend signing up for the patient portal called "MyChart".  Sign up information is provided on this After Visit Summary.  MyChart is used to connect with patients for Virtual Visits (Telemedicine).  Patients are able to view lab/test results, encounter notes, upcoming appointments, etc.  Non-urgent messages can be sent to your provider as well.   To learn more about what you can do with MyChart, go to [email or web address].    Your next appointment:   2 month(s)  Provider:   First available APP.

## 2023-07-14 ENCOUNTER — Other Ambulatory Visit: Payer: Medicare Other

## 2023-07-14 ENCOUNTER — Telehealth: Payer: Self-pay | Admitting: *Deleted

## 2023-07-14 DIAGNOSIS — I5033 Acute on chronic diastolic (congestive) heart failure: Secondary | ICD-10-CM

## 2023-07-14 DIAGNOSIS — D649 Anemia, unspecified: Secondary | ICD-10-CM | POA: Diagnosis not present

## 2023-07-14 MED ORDER — AMLODIPINE-ATORVASTATIN 10-40 MG PO TABS: 1.0000 | ORAL_TABLET | Freq: Every day | ORAL | 3 refills | Status: DC

## 2023-07-14 NOTE — Telephone Encounter (Signed)
Please advise on the following from [facility] Pt just recently filled Atorvastatin at local pharmacy Rx for Amlodipine-Atorvastatin was sent in on 07/07/23 Please see hospital discharge from 06/24/23, Last OV was on 07/07/23 And advise on current regiment  Call back # to [facility] [phone] Ref # [id number]

## 2023-07-14 NOTE — Telephone Encounter (Signed)
He was on a combination pill of amlodipine and atorvastatin previously and in the hospitalization they were worried about his blood pressure becoming low since he started a new diuretic and his blood pressure was actually still slightly elevated in the office so I instructed him to go back on his amlodipine-atorvastatin combo pill and stop the atorvastatin separate.

## 2023-07-14 NOTE — Addendum Note (Signed)
Addended by: Dorene Sorrow on: 07/14/2023 01:39 PM   Modules accepted: Orders

## 2023-07-14 NOTE — Telephone Encounter (Signed)
Spoke with pharm tech at [facility]. She is unsure what the DR issue was. Just that Furosemide had been cancelled but needed a new script for amlodipine-atorvastatin.  Refills sent per [name].

## 2023-07-15 ENCOUNTER — Other Ambulatory Visit (HOSPITAL_COMMUNITY): Payer: Self-pay | Admitting: Internal Medicine

## 2023-07-15 DIAGNOSIS — I4819 Other persistent atrial fibrillation: Secondary | ICD-10-CM

## 2023-07-15 LAB — FECAL OCCULT BLOOD, IMMUNOCHEMICAL: Fecal Occult Bld: POSITIVE — AB

## 2023-07-16 ENCOUNTER — Other Ambulatory Visit: Payer: Self-pay | Admitting: Family Medicine

## 2023-07-16 DIAGNOSIS — R195 Other fecal abnormalities: Secondary | ICD-10-CM

## 2023-07-16 NOTE — Telephone Encounter (Signed)
Eliquis 5mg  refill request received. Patient is 77 years old, weight-80.7kg, Crea-2.58 on 07/07/23, Diagnosis-Afib, and last seen by Dr. Antoine Poche on 07/09/23. Dose is appropriate based on dosing criteria. Will send in refill to requested pharmacy.

## 2023-07-23 ENCOUNTER — Telehealth: Payer: Self-pay

## 2023-07-23 NOTE — Telephone Encounter (Signed)
Copied from CRM [id number]. Topic: Clinical - Request for Lab/Test Order >> Jul 23, 2023  2:23 PM Fuller Mandril wrote: Reason for CRM: Pt returning calls about scheduling repeat labs. Per provider note 1 - 1 1/2 months out. Per pt appt already schedule for 1/13. Pt would like to just have labs done that day after appt. Thank You

## 2023-07-23 NOTE — Telephone Encounter (Signed)
Ok for patient to have labs done after seeing PCP on 1/13

## 2023-07-25 ENCOUNTER — Encounter: Payer: Self-pay | Admitting: Family Medicine

## 2023-07-25 DIAGNOSIS — D649 Anemia, unspecified: Secondary | ICD-10-CM

## 2023-07-25 DIAGNOSIS — R195 Other fecal abnormalities: Secondary | ICD-10-CM

## 2023-07-29 ENCOUNTER — Other Ambulatory Visit: Payer: Medicare Other

## 2023-08-04 ENCOUNTER — Other Ambulatory Visit: Payer: Self-pay | Admitting: Cardiology

## 2023-08-04 DIAGNOSIS — I5033 Acute on chronic diastolic (congestive) heart failure: Secondary | ICD-10-CM

## 2023-08-09 ENCOUNTER — Ambulatory Visit: Payer: Medicare Other | Admitting: Family Medicine

## 2023-08-09 ENCOUNTER — Encounter: Payer: Self-pay | Admitting: Family Medicine

## 2023-08-09 VITALS — BP 166/74 | HR 79 | Ht 70.0 in | Wt 177.0 lb

## 2023-08-09 DIAGNOSIS — I5033 Acute on chronic diastolic (congestive) heart failure: Secondary | ICD-10-CM

## 2023-08-09 DIAGNOSIS — N1832 Chronic kidney disease, stage 3b: Secondary | ICD-10-CM | POA: Diagnosis not present

## 2023-08-09 DIAGNOSIS — I159 Secondary hypertension, unspecified: Secondary | ICD-10-CM

## 2023-08-09 DIAGNOSIS — E785 Hyperlipidemia, unspecified: Secondary | ICD-10-CM | POA: Diagnosis not present

## 2023-08-09 DIAGNOSIS — N184 Chronic kidney disease, stage 4 (severe): Secondary | ICD-10-CM

## 2023-08-09 DIAGNOSIS — I10 Essential (primary) hypertension: Secondary | ICD-10-CM

## 2023-08-09 DIAGNOSIS — I130 Hypertensive heart and chronic kidney disease with heart failure and stage 1 through stage 4 chronic kidney disease, or unspecified chronic kidney disease: Secondary | ICD-10-CM

## 2023-08-09 DIAGNOSIS — I701 Atherosclerosis of renal artery: Secondary | ICD-10-CM

## 2023-08-09 DIAGNOSIS — I5042 Chronic combined systolic (congestive) and diastolic (congestive) heart failure: Secondary | ICD-10-CM

## 2023-08-09 LAB — LIPID PANEL

## 2023-08-09 MED ORDER — TORSEMIDE 20 MG PO TABS: 80.0000 mg | ORAL_TABLET | Freq: Two times a day (BID) | ORAL | 3 refills | Status: AC

## 2023-08-09 NOTE — Progress Notes (Addendum)
 BP (!) 166/74   Pulse 79   Ht 5' 10 (1.778 m)   Wt 177 lb (80.3 kg)   SpO2 99%   BMI 25.40 kg/m    Subjective:   Patient ID: James [name], male    DOB: 1946-06-14, 78 y.o.   MRN: [id number]  HPI: James [name] is a 78 y.o. male presenting on 08/09/2023 for Medical Management of Chronic Issues and Hypertension  Hypertension/CHF/CKD 4 Patient is currently taking clonidine , amlodipine , doxazosin , torsemide , and metoprolol . His blood pressure today is 161/74. He denies lightheadedness or dizziness, headaches, vision changes, or chest pain. He reports worsening lower extremity edema and dyspnea with exertion since his hospitalization in late November 2024. Patient has tried elevating his legs at night and periodically throughout the day. He also wears compression stockings as tolerated (compression socks bruise his legs easily since he is on apixaban ). The edema does improve some with these interventions, but it reaccumulates within 5-10 minutes of standing. His son has also noticed he has been more winded when walking short distances, such as from the couch to the kitchen. His son is concerned the patient will be hospitalized for another heart failure exacerbation soon if his medications are not adjusted since he is noticing some of the same signs prior to the last hospitalization.  Hyperlipidemia Patient is currently taking atorvastatin . He denies myalgias or weakness. He does not have a history of liver damage from it.   Memory loss Patient is currently taking donepezil  and sertraline . His memory and mood have been stable on these medications.   Relevant past medical, surgical, family and social history reviewed and updated as indicated. Interim medical history since our last visit reviewed. Allergies and medications reviewed and updated.  Review of Systems  Constitutional:  Negative for chills and fever.  HENT:  Negative for congestion and sore throat.   Eyes:  Negative  for visual disturbance.  Respiratory:  Positive for shortness of breath (with exertion). Negative for cough and chest tightness.   Cardiovascular:  Positive for leg swelling. Negative for chest pain and palpitations.  Gastrointestinal:  Positive for abdominal distention and diarrhea (s/p small bowel resection). Negative for abdominal pain and constipation.  Genitourinary:  Negative for difficulty urinating, dysuria and hematuria.  Musculoskeletal:  Negative for myalgias.  Skin:  Positive for color change (associated with lower extremity edema).  Neurological:  Negative for dizziness, syncope, weakness, light-headedness and headaches.  Hematological:  Bruises/bleeds easily (on apixaban ).    Per HPI unless specifically indicated above   Allergies as of 08/09/2023       Reactions   Plavix [clopidogrel Bisulfate] Other (See Comments)   GI Bleed   Uloric  [febuxostat ] Other (See Comments)   Unknown reaction        Medication List        Accurate as of August 09, 2023  4:59 PM. If you have any questions, ask your nurse or doctor.          allopurinol  100 MG tablet Commonly known as: ZYLOPRIM  TAKE 2 TABLETS DAILY   amLODipine -atorvastatin  10-40 MG tablet Commonly known as: Caduet  Take 1 tablet by mouth daily.   cloNIDine  0.3 MG tablet Commonly known as: CATAPRES  Take 0.3 mg by mouth 3 (three) times daily.   donepezil  5 MG tablet Commonly known as: ARICEPT  Take 1 tablet (5 mg total) by mouth at bedtime.   doxazosin  2 MG tablet Commonly known as: CARDURA  Take 4 mg by mouth 2 (two) times daily.  Eliquis  5 MG Tabs tablet Generic drug: apixaban  TAKE 1 TABLET TWICE A DAY   ipratropium-albuterol  0.5-2.5 (3) MG/3ML Soln Commonly known as: DUONEB Take 3 mLs by nebulization every 6 (six) hours as needed.   Iron  (Ferrous Sulfate ) 325 (65 Fe) MG Tabs Take 325 mg by mouth daily.   metoprolol  tartrate 50 MG tablet Commonly known as: LOPRESSOR  Take 1 tablet (50 mg total)  by mouth 2 (two) times daily.   potassium chloride  10 MEQ CR capsule Commonly known as: MICRO-K  Take 10 mEq by mouth daily.   sertraline  50 MG tablet Commonly known as: ZOLOFT  Take 1 tablet (50 mg total) by mouth daily.   torsemide  20 MG tablet Commonly known as: DEMADEX  Take 4 tablets (80 mg total) by mouth 2 (two) times daily. What changed: when to take this Changed by: Fonda LABOR Reneshia Zuccaro   Vitamin D3 50 MCG (2000 UT) Tabs Take 2,000 Units by mouth daily.         Objective:   BP (!) 166/74   Pulse 79   Ht 5' 10 (1.778 m)   Wt 177 lb (80.3 kg)   SpO2 99%   BMI 25.40 kg/m   Wt Readings from Last 3 Encounters:  08/09/23 177 lb (80.3 kg)  07/09/23 178 lb (80.7 kg)  07/07/23 177 lb (80.3 kg)    Physical Exam Vitals and nursing note reviewed.  Constitutional:      Appearance: Normal appearance. He is normal weight.  HENT:     Head: Normocephalic and atraumatic.     Right Ear: External ear normal.     Left Ear: External ear normal.  Eyes:     Conjunctiva/sclera: Conjunctivae normal.  Cardiovascular:     Rate and Rhythm: Bradycardia present. Rhythm irregular.     Heart sounds: Normal heart sounds.  Pulmonary:     Effort: Pulmonary effort is normal.     Breath sounds: Normal breath sounds. No wheezing or rales.  Abdominal:     General: There is distension (bloating).     Palpations: Abdomen is soft.     Tenderness: There is no abdominal tenderness. There is no right CVA tenderness, left CVA tenderness or guarding.  Musculoskeletal:     Cervical back: Normal range of motion and neck supple. No tenderness.     Right lower leg: Edema (+3 pitting edema up to knees) present.     Left lower leg: Edema (+2 pitting edema around ankles, +1 up to knee) present.  Skin:    General: Skin is warm and dry.     Comments: reddish-brown discoloration around ankles from edema  Neurological:     Mental Status: He is alert and oriented to person, place, and time. Mental status  is at baseline.  Psychiatric:        Mood and Affect: Mood normal.        Behavior: Behavior normal.        Thought Content: Thought content normal.        Judgment: Judgment normal.    Assessment & Plan:   Problem List Items Addressed This Visit       Cardiovascular and Mediastinum   Hypertension - Primary   Relevant Medications   torsemide  (DEMADEX ) 20 MG tablet   Other Relevant Orders   CBC with Differential/Platelet (Completed)   CMP14+EGFR (Completed)   Lipid panel (Completed)   Accelerated secondary hypertension   Relevant Medications   torsemide  (DEMADEX ) 20 MG tablet   Other Relevant Orders   CBC  with Differential/Platelet (Completed)   CMP14+EGFR (Completed)   Lipid panel (Completed)   CHF (congestive heart failure) (HCC)   Relevant Medications   torsemide  (DEMADEX ) 20 MG tablet   Renal artery stenosis (HCC)   Relevant Medications   torsemide  (DEMADEX ) 20 MG tablet     Genitourinary   CKD (chronic kidney disease) stage 4, GFR 15-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 Patient has significant lower extremity edema. Will increase torsemide  to 100 mg twice daily for 4 days to help lessen fluid retention. Educated patient on importance of weighing himself daily to detect fluid retention before developing a heart failure exacerbation. Recommended he notify us  if dyspnea and LE edema fail to improve with additional torsemide . Will recheck electrolytes and kidney function with CMP today. Patient will see nephrologist at the end of the month, so his numbers can be rechecked  there again. Patient is tolerating atorvastatin  well. Will recheck lipid panel today.  Follow up plan: Return in about 4 months (around 12/07/2023), or if symptoms worsen or fail to improve, for Hypertension and CHF and anxiety recheck.  Counseling provided for all of the vaccine components Orders Placed This Encounter  Procedures   CBC with Differential/Platelet   CMP14+EGFR   Lipid panel    Vernell Music, Medical Student [facility] 08/09/2023, 4:59 PM  I was personally present for all components of the history, physical exam and/or medical decision making.  I agree with the documentation performed by the student and agree with assessment and plan above.  Will do short bursts of increased diuretic and we will recheck his renal function with nephrology Fonda Levins, MD [facility] 08/19/2023, 8:01 AM

## 2023-08-10 LAB — CMP14+EGFR
ALT: 23 IU/L (ref 0–44)
AST: 29 IU/L (ref 0–40)
Albumin: 4.1 g/dL (ref 3.8–4.8)
Alkaline Phosphatase: 264 IU/L — ABNORMAL HIGH (ref 44–121)
BUN/Creatinine Ratio: 15 (ref 10–24)
BUN: 34 mg/dL — ABNORMAL HIGH (ref 8–27)
Bilirubin Total: 0.5 mg/dL (ref 0.0–1.2)
CO2: 23 mmol/L (ref 20–29)
Calcium: 9 mg/dL (ref 8.6–10.2)
Chloride: 99 mmol/L (ref 96–106)
Creatinine, Ser: 2.28 mg/dL — ABNORMAL HIGH (ref 0.76–1.27)
Globulin, Total: 2.6 g/dL (ref 1.5–4.5)
Glucose: 91 mg/dL (ref 70–99)
Potassium: 3.5 mmol/L (ref 3.5–5.2)
Sodium: 142 mmol/L (ref 134–144)
Total Protein: 6.7 g/dL (ref 6.0–8.5)
eGFR: 29 mL/min/{1.73_m2} — ABNORMAL LOW (ref >59)

## 2023-08-10 LAB — CBC WITH DIFFERENTIAL/PLATELET
Basophils Absolute: 0.1 10*3/uL (ref 0.0–0.2)
Basos: 1 %
EOS (ABSOLUTE): 0.3 10*3/uL (ref 0.0–0.4)
Eos: 4 %
Hematocrit: 32.1 % — ABNORMAL LOW (ref 37.5–51.0)
Hemoglobin: 10 g/dL — ABNORMAL LOW (ref 13.0–17.7)
Immature Grans (Abs): 0 10*3/uL (ref 0.0–0.1)
Immature Granulocytes: 0 %
Lymphocytes Absolute: 0.9 10*3/uL (ref 0.7–3.1)
Lymphs: 10 %
MCH: 27 pg (ref 26.6–33.0)
MCHC: 31.2 g/dL — ABNORMAL LOW (ref 31.5–35.7)
MCV: 87 fL (ref 79–97)
Monocytes Absolute: 0.5 10*3/uL (ref 0.1–0.9)
Monocytes: 5 %
Neutrophils Absolute: 7.5 10*3/uL — ABNORMAL HIGH (ref 1.4–7.0)
Neutrophils: 80 %
Platelets: 361 10*3/uL (ref 150–450)
RBC: 3.71 x10E6/uL — ABNORMAL LOW (ref 4.14–5.80)
RDW: 13.1 % (ref 11.6–15.4)
WBC: 9.2 10*3/uL (ref 3.4–10.8)

## 2023-08-10 LAB — LIPID PANEL
Cholesterol, Total: 116 mg/dL (ref 100–199)
HDL: 57 mg/dL (ref >39)
LDL CALC COMMENT:: 2 ratio (ref 0.0–5.0)
LDL Chol Calc (NIH): 42 mg/dL (ref 0–99)
Triglycerides: 90 mg/dL (ref 0–149)
VLDL Cholesterol Cal: 17 mg/dL (ref 5–40)

## 2023-08-13 ENCOUNTER — Encounter: Payer: Self-pay | Admitting: Family Medicine

## 2023-08-19 DIAGNOSIS — N184 Chronic kidney disease, stage 4 (severe): Secondary | ICD-10-CM | POA: Diagnosis not present

## 2023-08-23 ENCOUNTER — Other Ambulatory Visit: Payer: Self-pay | Admitting: Gastroenterology

## 2023-08-23 ENCOUNTER — Ambulatory Visit
Admission: EM | Admit: 2023-08-23 | Discharge: 2023-08-23 | Disposition: A | Discharge status: Home / Self Care | Payer: Medicare Other | Attending: Nurse Practitioner | Admitting: Nurse Practitioner

## 2023-08-23 DIAGNOSIS — R32 Unspecified urinary incontinence: Secondary | ICD-10-CM | POA: Diagnosis not present

## 2023-08-23 DIAGNOSIS — S61412A Laceration without foreign body of left hand, initial encounter: Secondary | ICD-10-CM

## 2023-08-23 DIAGNOSIS — D500 Iron deficiency anemia secondary to blood loss (chronic): Secondary | ICD-10-CM | POA: Diagnosis not present

## 2023-08-23 DIAGNOSIS — R195 Other fecal abnormalities: Secondary | ICD-10-CM | POA: Diagnosis not present

## 2023-08-23 DIAGNOSIS — S0181XA Laceration without foreign body of other part of head, initial encounter: Secondary | ICD-10-CM | POA: Diagnosis not present

## 2023-08-23 DIAGNOSIS — S61511A Laceration without foreign body of right wrist, initial encounter: Secondary | ICD-10-CM | POA: Diagnosis not present

## 2023-08-23 NOTE — Discharge Instructions (Signed)
Leave the dressing in place for 24 hours. May take over-the-counter Tylenol as needed for pain or discomfort. Apply ice to the left hand to help with pain and swelling.  Apply for 20 minutes, remove for 1 hour, repeat as needed.  Make sure you apply a cloth in between the ice pack and your skin. Cleanse the areas at least once daily.  You can clean the areas with warm water.  Recommend applying an over-the-counter topical ointment such as Neosporin or Vaseline petroleum jelly to help aid with healing.  Keep the areas covered when you are out in public.  May leave the areas open to air when you are home.  If you have pets, make sure your pets do not come into contact with the wounds.  You may cover the wounds with gauze or Coban with each dressing change. Gentle range of motion exercises of the right hand to keep the joint mobile. Monitor the areas for signs of infection.  This includes increased redness, swelling, or foul-smelling drainage.  If you notice any of the symptoms, follow-up with your primary care physician or the emergency department immediately.

## 2023-08-23 NOTE — ED Triage Notes (Addendum)
Pt states he fell today outside in the grass about 30 minuets ago. Pt now has a skin tear and bruise to right wrist and left hand.  Pt states his last tetanus shot was in October of 2024.

## 2023-08-24 DIAGNOSIS — N184 Chronic kidney disease, stage 4 (severe): Secondary | ICD-10-CM | POA: Diagnosis not present

## 2023-08-24 DIAGNOSIS — R600 Localized edema: Secondary | ICD-10-CM | POA: Diagnosis not present

## 2023-08-24 DIAGNOSIS — N2581 Secondary hyperparathyroidism of renal origin: Secondary | ICD-10-CM | POA: Diagnosis not present

## 2023-08-24 DIAGNOSIS — D631 Anemia in chronic kidney disease: Secondary | ICD-10-CM | POA: Diagnosis not present

## 2023-08-24 DIAGNOSIS — E876 Hypokalemia: Secondary | ICD-10-CM | POA: Diagnosis not present

## 2023-08-24 DIAGNOSIS — N189 Chronic kidney disease, unspecified: Secondary | ICD-10-CM | POA: Diagnosis not present

## 2023-08-24 DIAGNOSIS — I129 Hypertensive chronic kidney disease with stage 1 through stage 4 chronic kidney disease, or unspecified chronic kidney disease: Secondary | ICD-10-CM | POA: Diagnosis not present

## 2023-08-24 NOTE — ED Provider Notes (Signed)
[facility]    CSN: [id number] Arrival date & time: 08/23/23  1932      History   Chief Complaint Chief Complaint  Patient presents with   Laceration    HPI James [name] is a 78 y.o. male.   The history is provided by the patient.   Patient brought in by his family after he fell outside in the grass approximately 30 minutes prior to arrival.  Patient states that he tripped and fell.  Patient and family deny loss of consciousness.  Patient further denies lower extremity weakness, dizziness, or lightheadedness.  Family reports patient is on Eliquis for history of atrial fibrillation.  Patient with skin tear to the backside of the left hand along with bruising, and a skin tear to the right wrist.  Patient also has a mild laceration below the right eye.  Patient states that he did have on his glasses, and when he fell, the glasses cut his face.  Patient reports his last tetanus shot was in October 2024. Past Medical History:  Diagnosis Date   Arthritis    Carotid artery occlusion    Chronic kidney disease    DVT (deep venous thrombosis) (HCC) 2008   GERD (gastroesophageal reflux disease)    takes Protonix daily   Gout    takes Allopurinol daily and Indomethacin prn   H/O renal artery stenosis    H/O: GI bleed    History of blood transfusion    History of colon polyps    History of gastric ulcer    Hyperlipidemia    takes Lipitor daily   Hypertension    takes Metoprolol and Amlodipine daily   Joint pain    Joint swelling    Sleep apnea     Patient Active Problem List   Diagnosis Date Noted   Paroxysmal atrial fibrillation (HCC) 06/25/2023   CHF (congestive heart failure) (HCC) 06/25/2023   COPD (chronic obstructive pulmonary disease) (HCC) 06/25/2023   Iron deficiency anemia 06/25/2023   Dementia (HCC) 06/25/2023   Hypercoagulable state due to persistent atrial fibrillation (HCC) 10/22/2022   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   Pulmonary  nodules 08/30/2022   CKD (chronic kidney disease) stage 4, GFR 15-29 ml/min (HCC) 08/30/2022   Bilateral carotid artery stenosis 11/17/2021   Pericardial effusion 10/13/2020   OSA on CPAP 03/09/2016   Hypersomnia 01/07/2016   Exertional dyspnea 01/07/2016   Gastroesophageal reflux disease with esophagitis 07/17/2015   Accelerated secondary hypertension 07/17/2015   S/P arterial stent 04/29/2015   Renal artery stenosis (HCC) 12/26/2012   Hypertension 10/11/2012   Hyperlipidemia LDL goal <100 10/11/2012   Gout 10/11/2012   Occlusion and stenosis of carotid artery without mention of cerebral infarction 10/12/2011    Past Surgical History:  Procedure Laterality Date   CARDIOVERSION N/A 11/18/2022   Procedure: CARDIOVERSION;  Surgeon: Chilton Si, MD;  Location: [facility] INVASIVE CV LAB;  Service: Cardiovascular;  Laterality: N/A;   CAROTID ENDARTERECTOMY  2008   right CEA   CHOLECYSTECTOMY  mid 90's   COLON SURGERY     COLONOSCOPY     ENDARTERECTOMY  06/02/2012   Procedure: ENDARTERECTOMY CAROTID;  Surgeon: Nada Libman, MD;  Location: [facility] OR;  Service: Vascular;  Laterality: Left;   ESOPHAGOGASTRODUODENOSCOPY     HERNIA REPAIR     umbilical hernia   PATCH ANGIOPLASTY  06/02/2012   Procedure: PATCH ANGIOPLASTY;  Surgeon: Nada Libman, MD;  Location: [facility] OR;  Service: Vascular;  Laterality: Left;  using Vascu-Guard Patch   PERIPHERAL VASCULAR CATHETERIZATION Right 12/03/2015   Procedure: Peripheral Vascular Balloon Angioplasty;  Surgeon: Nada Libman, MD;  Location: [facility] INVASIVE CV LAB;  Service: Cardiovascular;  Laterality: Right;  RENAL unsuccessful   RENAL ARTERY STENT  2009   Right renal artery stenting-  Right Kidney   small intestine removed  2001   d/t random gi bleeding   SUBXYPHOID PERICARDIAL WINDOW N/A 09/23/2022   Procedure: SUBXYPHOID PERICARDIAL WINDOW;  Surgeon: Loreli Slot, MD;  Location: [facility] OR;  Service: Thoracic;  Laterality: N/A;   TEE WITHOUT  CARDIOVERSION N/A 09/23/2022   Procedure: TRANSESOPHAGEAL ECHOCARDIOGRAM;  Surgeon: Loreli Slot, MD;  Location: [facility] OR;  Service: Thoracic;  Laterality: N/A;       Home Medications    Prior to Admission medications   Medication Sig Start Date End Date Taking? Authorizing Provider  allopurinol (ZYLOPRIM) 100 MG tablet TAKE 2 TABLETS DAILY 03/30/23  Yes [name], [name], MD  amLODipine-atorvastatin (CADUET) 10-40 MG tablet Take 1 tablet by mouth daily. 07/14/23  Yes [name], [name], MD  apixaban (ELIQUIS) 5 MG TABS tablet TAKE 1 TABLET TWICE A DAY 07/16/23  Yes Rollene Rotunda, MD  Cholecalciferol (VITAMIN D3) 50 MCG (2000 UT) TABS Take 2,000 Units by mouth daily.   Yes [provider]  cloNIDine (CATAPRES) 0.3 MG tablet Take 0.3 mg by mouth 3 (three) times daily.   Yes [provider]  donepezil (ARICEPT) 5 MG tablet Take 1 tablet (5 mg total) by mouth at bedtime. 05/26/23  Yes [name], [name], MD  doxazosin (CARDURA) 2 MG tablet Take 4 mg by mouth 2 (two) times daily. 08/24/19  Yes [provider]  Iron, Ferrous Sulfate, 325 (65 Fe) MG TABS Take 325 mg by mouth daily. 07/07/23  Yes [name], [name], MD  potassium chloride (MICRO-K) 10 MEQ CR capsule Take 10 mEq by mouth daily. 12/10/19  Yes [provider]  sertraline (ZOLOFT) 50 MG tablet Take 1 tablet (50 mg total) by mouth daily. 05/26/23  Yes [name], [name], MD  torsemide (DEMADEX) 20 MG tablet Take 4 tablets (80 mg total) by mouth 2 (two) times daily. 08/09/23  Yes [name], [name], MD  ipratropium-albuterol (DUONEB) 0.5-2.5 (3) MG/3ML SOLN Take 3 mLs by nebulization every 6 (six) hours as needed. 06/28/23   Arrien, York Ram, MD  metoprolol tartrate (LOPRESSOR) 50 MG tablet Take 1 tablet (50 mg total) by mouth 2 (two) times daily. 07/07/23 08/06/23  [name], [name], MD    Family History Family History  Problem Relation Age of Onset   Hypertension Mother     Hyperlipidemia Mother    Other Mother        varicose veins   Hyperlipidemia Father    Hypertension Father    Diabetes Father    Clotting disorder Father    Deep vein thrombosis Father    Hypertension Sister    Diabetes Sister    Hyperlipidemia Sister    Varicose Veins Sister    Other Sister        Bleeding problems   Hypertension Brother    Hyperlipidemia Brother     Social History Social History   Tobacco Use   Smoking status: Former    Current packs/day: 0.00    Average packs/day: 1.5 packs/day for 36.0 years (54.0 ttl pk-yrs)    Types: Cigarettes    Start date: 12/26/1963    Quit date: 12/26/1999    Years since quitting: 23.6  Smokeless tobacco: Never  Vaping Use   Vaping status: Never Used  Substance Use Topics   Alcohol use: No    Comment: Quit in 1998   Drug use: No     Allergies   Plavix [clopidogrel bisulfate] and Uloric [febuxostat]   Review of Systems Review of Systems Per HPI  Physical Exam Triage Vital Signs ED Triage Vitals  Encounter Vitals Group     BP 08/23/23 1952 (!) 175/86     Systolic BP Percentile --      Diastolic BP Percentile --      Pulse Rate 08/23/23 1952 74     Resp 08/23/23 1952 16     Temp 08/23/23 1952 97.7 F (36.5 C)     Temp Source 08/23/23 1952 Oral     SpO2 08/23/23 1952 97 %     Weight --      Height --      Head Circumference --      Peak Flow --      Pain Score 08/23/23 1947 6     Pain Loc --      Pain Education --      Exclude from Growth Chart --    No data found.  Updated Vital Signs BP (!) 175/86 (BP Location: Right Arm)   Pulse 74   Temp 97.7 F (36.5 C) (Oral)   Resp 16   SpO2 97%   Visual Acuity Right Eye Distance:   Left Eye Distance:   Bilateral Distance:    Right Eye Near:   Left Eye Near:    Bilateral Near:     Physical Exam Vitals and nursing note reviewed.  Constitutional:      General: He is not in acute distress.    Appearance: Normal appearance.  HENT:     Head:  Normocephalic.  Eyes:     Extraocular Movements: Extraocular movements intact.     Conjunctiva/sclera: Conjunctivae normal.     Pupils: Pupils are equal, round, and reactive to light.  Cardiovascular:     Rate and Rhythm: Normal rate and regular rhythm.     Pulses: Normal pulses.     Heart sounds: Normal heart sounds.  Pulmonary:     Effort: Pulmonary effort is normal.     Breath sounds: Normal breath sounds.  Abdominal:     General: Bowel sounds are normal.     Palpations: Abdomen is soft.  Musculoskeletal:     Cervical back: Normal range of motion.  Lymphadenopathy:     Cervical: No cervical adenopathy.  Skin:    General: Skin is warm and dry.     Findings: Laceration and wound present.     Comments: Skin tear noted to the dorsal aspect of the left hand along with moderate bruising.  Skin tear also noted to the right wrist.  Superficial laceration noted below the right eye.  See attached images.  Neurological:     General: No focal deficit present.     Mental Status: He is alert and oriented to person, place, and time.     GCS: GCS eye subscore is 4. GCS verbal subscore is 5. GCS motor subscore is 6.     Cranial Nerves: Cranial nerves 2-12 are intact.     Sensory: Sensation is intact.     Motor: Motor function is intact.     Coordination: Coordination is intact.     Gait: Gait is intact.  Psychiatric:        Mood and  Affect: Mood normal.        Behavior: Behavior normal.          UC Treatments / Results  Labs (all labs ordered are listed, but only abnormal results are displayed) Labs Reviewed - No data to display  EKG   Radiology No results found.  Procedures Laceration Repair  Date/Time: 08/24/2023 3:50 PM  Performed by: Abran Cantor, NP Authorized by: Abran Cantor, NP   Consent:    Consent obtained:  Verbal   Consent given by:  Patient   Risks discussed:  Infection, pain, poor cosmetic result and poor wound healing Universal  protocol:    Procedure explained and questions answered to patient or proxy's satisfaction: yes     Patient identity confirmed:  Verbally with patient Anesthesia:    Anesthesia method:  None Laceration details:    Location:  Hand   Hand location:  L hand, dorsum   Wound length (cm): See attached image.   Laceration depth: See attached image. Treatment:    Wound cleansed with: Hibiclens and sterile water.   Amount of cleaning:  Standard   Irrigation method:  Tap Skin repair:    Repair method:  Steri-Strips Approximation:    Approximation:  Close Repair type:    Repair type:  Simple Post-procedure details:    Dressing:  Antibiotic ointment and non-adherent dressing Comments:     Skin tears to dorsal aspect of left hand repaired with 5- 1/4 inch Steri-Strips.  Bleeding controlled at this time.  Antibiotic ointment with nonadherent gauze and Coban applied.  Skin tear to right wrist provided with antibiotic ointment with nonadherent gauze and Coban.  Superficial laceration noted to the right face under the eye, area was cleansed with Hibiclens soap and sterile water.  No sutures required.  (including critical care time)  Medications Ordered in UC Medications - No data to display  Initial Impression / Assessment and Plan / UC Course  I have reviewed the triage vital signs and the nursing notes.  Pertinent labs & imaging results that were available during my care of the patient were reviewed by me and considered in my medical decision making (see chart for details).  Patient with skin tears to the left hand and right wrist along with a superficial laceration noted under the right eye status post fall.  Tdap performed in October 2024.  Patient denies loss of consciousness.  Supportive care recommendations were provided and discussed with the patient to include applying ice to the left hand to help with swelling and bruising, cleansing the areas twice daily, and to monitor for worsening  symptoms.  Wound care instructions were also provided to the patient.  Patient advised to follow-up with his PCP or in the emergency department if symptoms worsen.  Patient was in agreement with this plan of care and verbalizes understanding.  All questions were answered.  Patient stable for discharge. Final Clinical Impressions(s) / UC Diagnoses   Final diagnoses:  Skin tear of left hand without complication, initial encounter  Tear of skin of right wrist, initial encounter  Facial laceration, initial encounter     Discharge Instructions      Leave the dressing in place for 24 hours. May take over-the-counter Tylenol as needed for pain or discomfort. Apply ice to the left hand to help with pain and swelling.  Apply for 20 minutes, remove for 1 hour, repeat as needed.  Make sure you apply a cloth in between the ice pack and your  skin. Cleanse the areas at least once daily.  You can clean the areas with warm water.  Recommend applying an over-the-counter topical ointment such as Neosporin or Vaseline petroleum jelly to help aid with healing.  Keep the areas covered when you are out in public.  May leave the areas open to air when you are home.  If you have pets, make sure your pets do not come into contact with the wounds.  You may cover the wounds with gauze or Coban with each dressing change. Gentle range of motion exercises of the right hand to keep the joint mobile. Monitor the areas for signs of infection.  This includes increased redness, swelling, or foul-smelling drainage.  If you notice any of the symptoms, follow-up with your primary care physician or the emergency department immediately.    ED Prescriptions   None    PDMP not reviewed this encounter.   Abran Cantor, NP 08/24/23 [phone]

## 2023-08-26 DIAGNOSIS — W1809XA Striking against other object with subsequent fall, initial encounter: Secondary | ICD-10-CM | POA: Diagnosis not present

## 2023-08-26 DIAGNOSIS — Y92009 Unspecified place in unspecified non-institutional (private) residence as the place of occurrence of the external cause: Secondary | ICD-10-CM | POA: Diagnosis not present

## 2023-08-26 DIAGNOSIS — Y9389 Activity, other specified: Secondary | ICD-10-CM | POA: Diagnosis not present

## 2023-08-26 DIAGNOSIS — S61412A Laceration without foreign body of left hand, initial encounter: Secondary | ICD-10-CM | POA: Diagnosis not present

## 2023-08-30 ENCOUNTER — Other Ambulatory Visit: Payer: Medicare Other

## 2023-08-31 ENCOUNTER — Other Ambulatory Visit: Payer: Medicare Other

## 2023-09-10 ENCOUNTER — Ambulatory Visit: Payer: Medicare Other | Admitting: General Practice

## 2023-10-24 ENCOUNTER — Other Ambulatory Visit: Payer: Self-pay | Admitting: Family Medicine

## 2023-10-29 ENCOUNTER — Ambulatory Visit: Payer: Medicare Other | Admitting: Cardiology

## 2023-11-24 ENCOUNTER — Ambulatory Visit (INDEPENDENT_AMBULATORY_CARE_PROVIDER_SITE_OTHER): Payer: Medicare Other | Admitting: Family Medicine

## 2023-11-24 ENCOUNTER — Encounter: Payer: Self-pay | Admitting: Family Medicine

## 2023-11-24 VITALS — BP 173/74 | HR 93 | Ht 70.0 in | Wt 189.0 lb

## 2023-11-24 DIAGNOSIS — N184 Chronic kidney disease, stage 4 (severe): Secondary | ICD-10-CM

## 2023-11-24 DIAGNOSIS — D509 Iron deficiency anemia, unspecified: Secondary | ICD-10-CM

## 2023-11-24 DIAGNOSIS — I159 Secondary hypertension, unspecified: Secondary | ICD-10-CM

## 2023-11-24 DIAGNOSIS — I5042 Chronic combined systolic (congestive) and diastolic (congestive) heart failure: Secondary | ICD-10-CM

## 2023-11-24 DIAGNOSIS — E785 Hyperlipidemia, unspecified: Secondary | ICD-10-CM | POA: Diagnosis not present

## 2023-11-24 MED ORDER — SERTRALINE HCL 50 MG PO TABS: 50.0000 mg | ORAL_TABLET | Freq: Every day | ORAL | 3 refills | Status: DC

## 2023-11-24 MED ORDER — ALLOPURINOL 100 MG PO TABS: 200.0000 mg | ORAL_TABLET | Freq: Every day | ORAL | 3 refills | Status: AC

## 2023-11-24 NOTE — Progress Notes (Signed)
 BP (!) 173/74   Pulse 93   Ht 5\' 10"  (1.778 m)   Wt 189 lb (85.7 kg)   SpO2 93%   BMI 27.12 kg/m    Subjective:   Patient ID: James [name], male    DOB: 1945/11/11, 78 y.o.   MRN: [id number]  HPI: James [name] is a 78 y.o. male presenting on 11/24/2023 for Medical Management of Chronic Issues, Hypertension, and Congestive Heart Failure   HPI Hypertension and CHF and CKD 4 Patient is currently on amlodipine  and clonidine  and doxazosin  and torsemide  and metoprolol , and their blood pressure today is 173/74, though it has been very difficult to control his but it has been a little bit better than it was before.. Patient denies any lightheadedness or dizziness. Patient denies headaches, blurred vision, chest pains, shortness of breath, or weakness. Denies any side effects from medication and is content with current medication.   Hyperlipidemia Patient is coming in for recheck of his hyperlipidemia. The patient is currently taking atorvastatin . They deny any issues with myalgias or history of liver damage from it. They deny any focal numbness or weakness or chest pain.   Patient has been having more swelling in his legs and his legs are.  He normally weighs about 180 and 179 at home but he says at home recently it has been 186 and here in the office today is 189.  He denies any shortness of breath but his son is here with him says that he has had a little bit more difficulty breathing.  Relevant past medical, surgical, family and social history reviewed and updated as indicated. Interim medical history since our last visit reviewed. Allergies and medications reviewed and updated.  Review of Systems  Constitutional:  Negative for chills and fever.  Eyes:  Negative for visual disturbance.  Respiratory:  Negative for shortness of breath and wheezing.   Cardiovascular:  Positive for leg swelling. Negative for chest pain.  Musculoskeletal:  Negative for back pain and gait  problem.  Skin:  Negative for rash.  Neurological:  Negative for dizziness and light-headedness.  All other systems reviewed and are negative.   Per HPI unless specifically indicated above   Allergies as of 11/24/2023       Reactions   Plavix [clopidogrel Bisulfate] Other (See Comments)   GI Bleed   Uloric  [febuxostat ] Other (See Comments)   Unknown reaction        Medication List        Accurate as of November 24, 2023  4:04 PM. If you have any questions, ask your nurse or doctor.          allopurinol  100 MG tablet Commonly known as: ZYLOPRIM  Take 2 tablets (200 mg total) by mouth daily.   amLODipine -atorvastatin  10-40 MG tablet Commonly known as: Caduet  Take 1 tablet by mouth daily.   cloNIDine  0.3 MG tablet Commonly known as: CATAPRES  Take 0.3 mg by mouth 3 (three) times daily.   donepezil  5 MG tablet Commonly known as: ARICEPT  Take 1 tablet (5 mg total) by mouth at bedtime.   doxazosin  2 MG tablet Commonly known as: CARDURA  Take 4 mg by mouth 2 (two) times daily.   Eliquis  5 MG Tabs tablet Generic drug: apixaban  TAKE 1 TABLET TWICE A DAY   ipratropium-albuterol  0.5-2.5 (3) MG/3ML Soln Commonly known as: DUONEB Take 3 mLs by nebulization every 6 (six) hours as needed.   Iron  (Ferrous Sulfate ) 325 (65 Fe) MG Tabs Take 325 mg by  mouth daily.   metoprolol  tartrate 50 MG tablet Commonly known as: LOPRESSOR  Take 1 tablet (50 mg total) by mouth 2 (two) times daily.   potassium chloride  10 MEQ CR capsule Commonly known as: MICRO-K  Take 10 mEq by mouth daily.   sertraline  50 MG tablet Commonly known as: ZOLOFT  Take 1 tablet (50 mg total) by mouth daily.   torsemide  20 MG tablet Commonly known as: DEMADEX  Take 4 tablets (80 mg total) by mouth 2 (two) times daily.   Vitamin D3 50 MCG (2000 UT) Tabs Take 2,000 Units by mouth daily.         Objective:   BP (!) 173/74   Pulse 93   Ht 5\' 10"  (1.778 m)   Wt 189 lb (85.7 kg)   SpO2 93%   BMI  27.12 kg/m   Wt Readings from Last 3 Encounters:  11/24/23 189 lb (85.7 kg)  08/09/23 177 lb (80.3 kg)  07/09/23 178 lb (80.7 kg)    Physical Exam Vitals and nursing note reviewed.  Constitutional:      General: He is not in acute distress.    Appearance: He is well-developed. He is not diaphoretic.  Eyes:     General: No scleral icterus.    Conjunctiva/sclera: Conjunctivae normal.  Neck:     Thyroid : No thyromegaly.  Cardiovascular:     Rate and Rhythm: Normal rate and regular rhythm.     Heart sounds: Normal heart sounds. No murmur heard. Pulmonary:     Effort: Pulmonary effort is normal. No respiratory distress.     Breath sounds: Normal breath sounds. No wheezing.  Musculoskeletal:        General: Swelling (3+ peripheral edema) present. Normal range of motion.     Cervical back: Neck supple.  Lymphadenopathy:     Cervical: No cervical adenopathy.  Skin:    General: Skin is warm and dry.     Findings: No rash.  Neurological:     Mental Status: He is alert and oriented to person, place, and time.     Coordination: Coordination normal.  Psychiatric:        Behavior: Behavior normal.       Assessment & Plan:   Problem List Items Addressed This Visit       Cardiovascular and Mediastinum   Accelerated secondary hypertension - Primary   CHF (congestive heart failure) (HCC)     Genitourinary   CKD (chronic kidney disease) stage 4, GFR 15-29 ml/min (HCC)     Other   Hyperlipidemia LDL goal <100   Iron  deficiency anemia    Blood pressure up but it is about his best to control as we can hear.  Patient fluid does seem like he is starting to head into congestive heart failure and fluid overload.  Recommended that he increase his torsemide  by an extra dose for the next 4 days and weigh himself every day and let us  know how he is doing. Follow up plan: Return in about 3 months (around 02/23/2024), or if symptoms worsen or fail to improve, for CHF and hyperlipidemia and  hypertension.  Counseling provided for all of the vaccine components No orders of the defined types were placed in this encounter.   Jolyne Needs, MD [facility] 11/24/2023, 4:04 PM

## 2023-12-02 DIAGNOSIS — S3131XA Laceration without foreign body of scrotum and testes, initial encounter: Secondary | ICD-10-CM | POA: Diagnosis not present

## 2023-12-03 ENCOUNTER — Telehealth: Payer: Self-pay

## 2023-12-03 NOTE — Telephone Encounter (Signed)
 Okay thanks for the information

## 2023-12-03 NOTE — Telephone Encounter (Unsigned)
 Copied from CRM [id number]. Topic: Clinical - Medical Advice >> Dec 03, 2023  1:26 PM Arlie Benedict B wrote: Reason for CRM: Evertt Hoe from Dr. Lang Pipes called from [phone] to advise Dr. Steen Eden that the patient was referred over to Dr. Florance Hun, was scheduled for an appt and was a no show, office attempted to reschedule with the patient and patient refused. Ms. Evertt Hoe stated she needed to inform the provider of the patient not wanting to attend the visit

## 2023-12-04 ENCOUNTER — Encounter (HOSPITAL_COMMUNITY): Payer: Self-pay

## 2023-12-04 ENCOUNTER — Emergency Department (HOSPITAL_COMMUNITY)
Admission: EM | Admit: 2023-12-04 | Discharge: 2023-12-04 | Disposition: A | Discharge status: Home / Self Care | Attending: Student | Admitting: Student

## 2023-12-04 ENCOUNTER — Other Ambulatory Visit: Payer: Self-pay

## 2023-12-04 DIAGNOSIS — W272XXA Contact with scissors, initial encounter: Secondary | ICD-10-CM | POA: Diagnosis not present

## 2023-12-04 DIAGNOSIS — F0390 Unspecified dementia without behavioral disturbance: Secondary | ICD-10-CM | POA: Diagnosis not present

## 2023-12-04 DIAGNOSIS — N184 Chronic kidney disease, stage 4 (severe): Secondary | ICD-10-CM | POA: Insufficient documentation

## 2023-12-04 DIAGNOSIS — Y93E8 Activity, other personal hygiene: Secondary | ICD-10-CM | POA: Diagnosis not present

## 2023-12-04 DIAGNOSIS — I509 Heart failure, unspecified: Secondary | ICD-10-CM | POA: Insufficient documentation

## 2023-12-04 DIAGNOSIS — J449 Chronic obstructive pulmonary disease, unspecified: Secondary | ICD-10-CM | POA: Insufficient documentation

## 2023-12-04 DIAGNOSIS — S3131XA Laceration without foreign body of scrotum and testes, initial encounter: Secondary | ICD-10-CM | POA: Diagnosis not present

## 2023-12-04 DIAGNOSIS — Z7901 Long term (current) use of anticoagulants: Secondary | ICD-10-CM | POA: Diagnosis not present

## 2023-12-04 DIAGNOSIS — S3131XD Laceration without foreign body of scrotum and testes, subsequent encounter: Secondary | ICD-10-CM | POA: Diagnosis not present

## 2023-12-04 DIAGNOSIS — Z79899 Other long term (current) drug therapy: Secondary | ICD-10-CM | POA: Diagnosis not present

## 2023-12-04 DIAGNOSIS — Z86718 Personal history of other venous thrombosis and embolism: Secondary | ICD-10-CM | POA: Insufficient documentation

## 2023-12-04 DIAGNOSIS — I130 Hypertensive heart and chronic kidney disease with heart failure and stage 1 through stage 4 chronic kidney disease, or unspecified chronic kidney disease: Secondary | ICD-10-CM | POA: Insufficient documentation

## 2023-12-04 DIAGNOSIS — S3994XA Unspecified injury of external genitals, initial encounter: Secondary | ICD-10-CM | POA: Diagnosis present

## 2023-12-04 DIAGNOSIS — Z87891 Personal history of nicotine dependence: Secondary | ICD-10-CM | POA: Insufficient documentation

## 2023-12-04 MED ORDER — LIDOCAINE-EPINEPHRINE (PF) 2 %-1:200000 IJ SOLN
10.0000 mL | Freq: Once | INTRAMUSCULAR | Status: AC
  Administered 2023-12-04: 10 mL via INTRADERMAL
  Filled 2023-12-04: qty 20

## 2023-12-04 NOTE — ED Provider Notes (Addendum)
 [facility] EMERGENCY DEPARTMENT AT [facility] Provider Note  CSN: [id number] Arrival date & time: 12/04/23 1909  Chief Complaint(s) Laceration  HPI James [name] is a 78 y.o. male with PMH DVT on Eliquis , HTN, peptic ulcer disease, COPD, CHF who presents emergency room for evaluation of persistent scrotal bleeding.  Patient states that on 12/02/2023 he was trimming his pubic hair with a pair of scissors when he lacerated his scrotum.  He went to urgent care on 12/02/2023 and they attempted to control the bleeding with pressure and gauze and ultimately discharged him home.  He had persistent oozing so he went to the urgent care today who used silver nitrate (?) to attempt to cauterize nonmucosal bleeding and unsurprisingly did not have success stopping bleeding.  He was then transferred to the emergency department for further evaluation.  Here in emergency room, patient has some mild oozing from a 3 cm scrotal laceration.  No additional complaints today.   Past Medical History Past Medical History:  Diagnosis Date   Arthritis    Carotid artery occlusion    Chronic kidney disease    DVT (deep venous thrombosis) (HCC) 2008   GERD (gastroesophageal reflux disease)    takes Protonix  daily   Gout    takes Allopurinol  daily and Indomethacin  prn   H/O renal artery stenosis    H/O: GI bleed    History of blood transfusion    History of colon polyps    History of gastric ulcer    Hyperlipidemia    takes Lipitor daily   Hypertension    takes Metoprolol  and Amlodipine  daily   Joint pain    Joint swelling    Sleep apnea    Patient Active Problem List   Diagnosis Date Noted   Paroxysmal atrial fibrillation (HCC) 06/25/2023   CHF (congestive heart failure) (HCC) 06/25/2023   COPD (chronic obstructive pulmonary disease) (HCC) 06/25/2023   Iron  deficiency anemia 06/25/2023   Dementia (HCC) 06/25/2023   Hypercoagulable state due to persistent atrial fibrillation (HCC) 10/22/2022    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   Pulmonary nodules 08/30/2022   CKD (chronic kidney disease) stage 4, GFR 15-29 ml/min (HCC) 08/30/2022   Bilateral carotid artery stenosis 11/17/2021   Pericardial effusion 10/13/2020   OSA on CPAP 03/09/2016   Hypersomnia 01/07/2016   Exertional dyspnea 01/07/2016   Gastroesophageal reflux disease with esophagitis 07/17/2015   Accelerated secondary hypertension 07/17/2015   S/P arterial stent 04/29/2015   Renal artery stenosis (HCC) 12/26/2012   Hypertension 10/11/2012   Hyperlipidemia LDL goal <100 10/11/2012   Gout 10/11/2012   Occlusion and stenosis of carotid artery without mention of cerebral infarction 10/12/2011   Home Medication(s) Prior to Admission medications   Medication Sig Start Date End Date Taking? Authorizing Provider  allopurinol  (ZYLOPRIM ) 100 MG tablet Take 2 tablets (200 mg total) by mouth daily. 11/24/23   [name], Lucio Sabin, MD  amLODipine -atorvastatin  (CADUET ) 10-40 MG tablet Take 1 tablet by mouth daily. 07/14/23   [name], Lucio Sabin, MD  apixaban  (ELIQUIS ) 5 MG TABS tablet TAKE 1 TABLET TWICE A DAY 07/16/23   Eilleen Grates, MD  Cholecalciferol  (VITAMIN D3) 50 MCG (2000 UT) TABS Take 2,000 Units by mouth daily.    [provider]  cloNIDine  (CATAPRES ) 0.3 MG tablet Take 0.3 mg by mouth 3 (three) times daily.    [provider]  donepezil  (ARICEPT ) 5 MG tablet Take 1 tablet (5 mg total) by mouth at bedtime. 05/26/23   [name],  Lucio Sabin, MD  doxazosin  (CARDURA ) 2 MG tablet Take 4 mg by mouth 2 (two) times daily. 08/24/19   [provider]  ipratropium-albuterol  (DUONEB) 0.5-2.5 (3) MG/3ML SOLN Take 3 mLs by nebulization every 6 (six) hours as needed. 06/28/23   Arrien, Curlee Doss, MD  Iron , Ferrous Sulfate , 325 (65 Fe) MG TABS Take 325 mg by mouth daily. 07/07/23   [name], Lucio Sabin, MD  metoprolol  tartrate (LOPRESSOR ) 50 MG tablet Take 1 tablet (50 mg total) by mouth 2 (two)  times daily. 07/07/23 08/06/23  [name], Lucio Sabin, MD  potassium chloride  (MICRO-K ) 10 MEQ CR capsule Take 10 mEq by mouth daily. 12/10/19   [provider]  sertraline  (ZOLOFT ) 50 MG tablet Take 1 tablet (50 mg total) by mouth daily. 11/24/23   [name], Lucio Sabin, MD  torsemide  (DEMADEX ) 20 MG tablet Take 4 tablets (80 mg total) by mouth 2 (two) times daily. 08/09/23   [name], Lucio Sabin, MD                                                                                                                                    Past Surgical History Past Surgical History:  Procedure Laterality Date   CARDIOVERSION N/A 11/18/2022   Procedure: CARDIOVERSION;  Surgeon: Maudine Sos, MD;  Location: [facility] INVASIVE CV LAB;  Service: Cardiovascular;  Laterality: N/A;   CAROTID ENDARTERECTOMY  2008   right CEA   CHOLECYSTECTOMY  mid 90's   COLON SURGERY     COLONOSCOPY     ENDARTERECTOMY  06/02/2012   Procedure: ENDARTERECTOMY CAROTID;  Surgeon: Margherita Shell, MD;  Location: [facility] OR;  Service: Vascular;  Laterality: Left;   ESOPHAGOGASTRODUODENOSCOPY     HERNIA REPAIR     umbilical hernia   PATCH ANGIOPLASTY  06/02/2012   Procedure: PATCH ANGIOPLASTY;  Surgeon: Margherita Shell, MD;  Location: [facility] OR;  Service: Vascular;  Laterality: Left;  using Vascu-Guard Patch   PERIPHERAL VASCULAR CATHETERIZATION Right 12/03/2015   Procedure: Peripheral Vascular Balloon Angioplasty;  Surgeon: Margherita Shell, MD;  Location: [facility] INVASIVE CV LAB;  Service: Cardiovascular;  Laterality: Right;  RENAL unsuccessful   RENAL ARTERY STENT  2009   Right renal artery stenting-  Right Kidney   small intestine removed  2001   d/t random gi bleeding   SUBXYPHOID PERICARDIAL WINDOW N/A 09/23/2022   Procedure: SUBXYPHOID PERICARDIAL WINDOW;  Surgeon: Zelphia Higashi, MD;  Location: [facility] OR;  Service: Thoracic;  Laterality: N/A;   TEE WITHOUT CARDIOVERSION N/A 09/23/2022   Procedure: TRANSESOPHAGEAL ECHOCARDIOGRAM;   Surgeon: Zelphia Higashi, MD;  Location: [facility] OR;  Service: Thoracic;  Laterality: N/A;   Family History Family History  Problem Relation Age of Onset   Hypertension Mother    Hyperlipidemia Mother    Other Mother        varicose veins   Hyperlipidemia Father    Hypertension Father  Diabetes Father    Clotting disorder Father    Deep vein thrombosis Father    Hypertension Sister    Diabetes Sister    Hyperlipidemia Sister    Varicose Veins Sister    Other Sister        Bleeding problems   Hypertension Brother    Hyperlipidemia Brother     Social History Social History   Tobacco Use   Smoking status: Former    Current packs/day: 0.00    Average packs/day: 1.5 packs/day for 36.0 years (54.0 ttl pk-yrs)    Types: Cigarettes    Start date: 12/26/1963    Quit date: 12/26/1999    Years since quitting: 23.9   Smokeless tobacco: Never  Vaping Use   Vaping status: Never Used  Substance Use Topics   Alcohol use: No    Comment: Quit in 1998   Drug use: No   Allergies Plavix [clopidogrel bisulfate] and Uloric  [febuxostat ]  Review of Systems Review of Systems  Skin:  Positive for wound.    Physical Exam Vital Signs  I have reviewed the triage vital signs Ht 5\' 10"  (1.778 m)   Wt 85.7 kg   BMI 27.12 kg/m   Physical Exam Vitals and nursing note reviewed.  Constitutional:      General: He is not in acute distress.    Appearance: He is well-developed.  HENT:     Head: Normocephalic and atraumatic.  Eyes:     Conjunctiva/sclera: Conjunctivae normal.  Cardiovascular:     Rate and Rhythm: Normal rate and regular rhythm.     Heart sounds: No murmur heard. Pulmonary:     Effort: Pulmonary effort is normal. No respiratory distress.     Breath sounds: Normal breath sounds.  Abdominal:     Palpations: Abdomen is soft.     Tenderness: There is no abdominal tenderness.  Musculoskeletal:        General: No swelling.     Cervical back: Neck supple.  Skin:     General: Skin is warm and dry.     Capillary Refill: Capillary refill takes less than 2 seconds.     Findings: Lesion present.  Neurological:     Mental Status: He is alert.  Psychiatric:        Mood and Affect: Mood normal.     ED Results and Treatments Labs (all labs ordered are listed, but only abnormal results are displayed) Labs Reviewed - No data to display                                                                                                                        Radiology No results found.  Pertinent labs & imaging results that were available during my care of the patient were reviewed by me and considered in my medical decision making (see MDM for details).  Medications Ordered in ED Medications - No data to display  Procedures .Laceration Repair  Date/Time: 12/04/2023 9:23 PM  Performed by: Karlyn Overman, MD Authorized by: Karlyn Overman, MD   Anesthesia:    Anesthesia method:  Local infiltration   Local anesthetic:  Lidocaine  2% WITH epi Laceration details:    Location:  Anogenital   Anogenital location:  Scrotum   Length (cm):  3 Treatment:    Amount of cleaning:  Standard   Irrigation solution:  Sterile saline   Irrigation method:  Pressure wash Skin repair:    Repair method:  Sutures   Suture size:  5-0   Suture material:  Fast-absorbing gut   Suture technique:  Simple interrupted   Number of sutures:  5 Approximation:    Approximation:  Close Repair type:    Repair type:  Simple Post-procedure details:    Dressing:  Non-adherent dressing   Procedure completion:  Tolerated well, no immediate complications   (including critical care time)  Medical Decision Making / ED Course   This patient presents to the ED for concern of scrotal bleeding, this involves an extensive number of treatment options, and is a  complaint that carries with it a high risk of complications and morbidity.  The differential diagnosis includes laceration, wound infection, tissue maceration from silver nitrate use, coagulopathy  MDM: Patient seen in the emergency room for evaluation of scrotal wound.  Physical exam with a 3 cm oozing scrotal laceration but is otherwise unremarkable.  Laceration repaired with absorbable sutures and we were able to successfully get hemostasis of the area. Outpatient urologic referral placed for wound check.  At this time he does not meet inpatient criteria for admission will be discharged with outpatient follow-up.   Additional history obtained: -Additional history obtained from wife -External records from outside source obtained and reviewed including: Chart review including previous notes, labs, imaging, consultation notes   Medicines ordered and prescription drug management: No orders of the defined types were placed in this encounter.   -I have reviewed the patients home medicines and have made adjustments as needed  Critical interventions none    Cardiac Monitoring: The patient was maintained on a cardiac monitor.  I personally viewed and interpreted the cardiac monitored which showed an underlying rhythm of: NSR  Social Determinants of Health:  Factors impacting patients care include: none   Reevaluation: After the interventions noted above, I reevaluated the patient and found that they have :improved  Co morbidities that complicate the patient evaluation  Past Medical History:  Diagnosis Date   Arthritis    Carotid artery occlusion    Chronic kidney disease    DVT (deep venous thrombosis) (HCC) 2008   GERD (gastroesophageal reflux disease)    takes Protonix  daily   Gout    takes Allopurinol  daily and Indomethacin  prn   H/O renal artery stenosis    H/O: GI bleed    History of blood transfusion    History of colon polyps    History of gastric ulcer     Hyperlipidemia    takes Lipitor daily   Hypertension    takes Metoprolol  and Amlodipine  daily   Joint pain    Joint swelling    Sleep apnea       Dispostion: I considered admission for this patient, but at this time he does not meet inpatient criteria for admission and will be discharged with outpatient follow-up     Final Clinical Impression(s) / ED Diagnoses Final diagnoses:  None     @PCDICTATION @    Arohi Salvatierra, O[place],  MD 12/04/23 2126    Karlyn Overman, MD 12/04/23 2127    Karlyn Overman, MD 12/04/23 2128

## 2023-12-04 NOTE — ED Triage Notes (Signed)
 Pt arrived from home via POV c/o laceration to groin caused by shaving that has not stopped bleeding. Pt was seen at [facility] on Tuesday, then again today. Originally area stopped bleeding then started again today. Per pt UC attempted to use both dermabond and silver nitrate to stop the bleeding both were unsuccessful, pt was then sent here.

## 2023-12-04 NOTE — ED Notes (Signed)
 Area cleaned with NS, small amount of oozing noted, dressing applied,

## 2023-12-22 ENCOUNTER — Other Ambulatory Visit: Payer: Self-pay | Admitting: Family Medicine

## 2023-12-22 DIAGNOSIS — F01B0 Vascular dementia, moderate, without behavioral disturbance, psychotic disturbance, mood disturbance, and anxiety: Secondary | ICD-10-CM

## 2023-12-22 NOTE — Progress Notes (Unsigned)
 Cardiology Office Note:   Date:  12/23/2023  ID:  James [name], DOB 06-16-1946, MRN [id number] PCP: [name], Lucio Sabin, MD  [facility] Providers Cardiologist:  Eilleen Grates, MD {  History of Present Illness:   James [name] is a 78 y.o. male who presents for evaluation of fatigue and difficult to control hypertension and a large pericardial effusion. I followed this for a long while but the effusion became larger and he had elective pericardial window in March 2023.      He had atrial fib and had cardioversion on 11/18/22.  Follow-up echo earlier this month demonstrated very small pericardial effusion.  He had well-preserved ejection fraction.    In Nov 2024 he was in the hospital with acute respiratory failure.  I reviewed these records for this visit.  He had excess volume.  Talking to his son that happened pretty quickly.  He has chronic lower extremity swelling.  This stays relatively stable.  He has progressive renal insufficiency and sees nephrology.  He was sent home from the hospital in November he was changed from Lasix  to torsemide .  He actually saw his primary provider recently and had those increase from 80 twice daily to 100 mg twice daily and he was post to do that for short period but I think he is continued to do that.  He is not having any new shortness of breath.  He says he is dyspneic walking around anywhere short of breath when we walked around the office but he is saturations stayed in the mid 90s.  His lower extremity swelling seems to be baseline.  He is not having any new PND or orthopnea.  He is not having any new chest pressure, neck or arm discomfort.  Family talks about him having progressive dementia which is a little bit obvious in the office today.  Of note because of his progressive lower extremity swelling he did have his amlodipine  held but this was restarted since then.  ROS: As stated in the HPI and negative for all other systems.  Studies  Reviewed:    EKG:   EKG Interpretation Date/Time:  Thursday Dec 23 2023 16:20:46 EDT Ventricular Rate:  69 PR Interval:    QRS Duration:  104 QT Interval:  474 QTC Calculation: 507 R Axis:   192  Text Interpretation: Atrial fibrillation Right superior axis deviation Possible Anterior infarct (cited on or before 25-Jun-2023) Prolonged QT When compared with ECG of 25-Jun-2023 17:51, Vent. rate has decreased BY  36 BPM No significant change since last tracing Confirmed by Eilleen Grates ([id number]) on 12/23/2023 4:32:25 PM    Risk Assessment/Calculations:    CHA2DS2-VASc Score = 4   This indicates a 4.8% annual risk of stroke. The patient's score is based upon: CHF History: 0 HTN History: 1 Diabetes History: 0 Stroke History: 0 Vascular Disease History: 1 Age Score: 2 Gender Score: 0    Physical Exam:   VS:  BP (!) 172/72   Pulse 95   Wt 179 lb 9.6 oz (81.5 kg)   SpO2 96% Comment: after walking , a square around the office exam room  BMI 25.77 kg/m    Wt Readings from Last 3 Encounters:  12/23/23 179 lb 9.6 oz (81.5 kg)  12/04/23 189 lb (85.7 kg)  11/24/23 189 lb (85.7 kg)     GEN: Well nourished, well developed in no acute distress NECK: No JVD; No carotid bruits CARDIAC: Irregular RR, no murmurs, rubs, gallops RESPIRATORY:  Clear  to auscultation without rales, wheezing or rhonchi  ABDOMEN: Soft, non-tender, non-distended EXTREMITIES:  moderate leg edema; No deformity   ASSESSMENT AND PLAN:   PERICARDIAL EFFUSION:   This was small on previous follow-up echo in November.  No further imaging at this point.  HTN:  His BP is still elevated.  He has been either been intolerant to or failed other therapies and I went through the list of previous medications and allergies.  I sent a message to Dr. Charlotte Cookey to see if there is any further imaging of his renal arteries that we might be able to do.  He is followed by nephrology.  At this point no change in therapy given our limited  options.   RAS:   His creatinine is 2.28.  As above.  We will go ahead and order a basic metabolic profile and asked him to go get this done at [facility].   CAROTID STENOSIS:   This has been mild.  No further imaging.   ATRIAL FIB:   He tolerates anticoagulation and has good rate control.  No change in therapy.    ANEMIA:   Hemoglobin was 10.0 which is up from previous.  No change in therapy.   ACUTE DIASTOLIC HF: His volume seems to be mostly lower extremity.  His lungs sound clear.  For right now he will continue on the current dose of torsemide  but further adjustments will be based on his creatinine.  He is watching his salt and fluid.       Follow up with me in six months.   Signed, Eilleen Grates, MD

## 2023-12-23 ENCOUNTER — Encounter: Payer: Self-pay | Admitting: Cardiology

## 2023-12-23 ENCOUNTER — Ambulatory Visit: Attending: Cardiology | Admitting: Cardiology

## 2023-12-23 VITALS — BP 172/72 | HR 95 | Wt 179.6 lb

## 2023-12-23 DIAGNOSIS — I5031 Acute diastolic (congestive) heart failure: Secondary | ICD-10-CM

## 2023-12-23 DIAGNOSIS — I3139 Other pericardial effusion (noninflammatory): Secondary | ICD-10-CM | POA: Diagnosis not present

## 2023-12-23 DIAGNOSIS — I701 Atherosclerosis of renal artery: Secondary | ICD-10-CM

## 2023-12-23 DIAGNOSIS — I6523 Occlusion and stenosis of bilateral carotid arteries: Secondary | ICD-10-CM | POA: Diagnosis not present

## 2023-12-23 NOTE — Patient Instructions (Signed)
 Medication Instructions:  No changes   *If you need a refill on your cardiac medications before your next appointment, please call your pharmacy*   Lab Work: Not needed  If you have labs (blood work) drawn today and your tests are completely normal, you will receive your results only by: MyChart Message (if you have MyChart) OR A paper copy in the mail If you have any lab test that is abnormal or we need to change your treatment, we will call you to review the results.   Testing/Procedures:  Not needed  Follow-Up: At [facility], you and your health needs are our priority.  As part of our continuing mission to provide you with exceptional heart care, we have created designated Provider Care Teams.  These Care Teams include your primary Cardiologist (physician) and Advanced Practice Providers (APPs -  Physician Assistants and Nurse Practitioners) who all work together to provide you with the care you need, when you need it.     Your next appointment:   6 month(s)  The format for your next appointment:   In Person  Provider:   Eilleen Grates, MD

## 2023-12-28 ENCOUNTER — Telehealth (HOSPITAL_BASED_OUTPATIENT_CLINIC_OR_DEPARTMENT_OTHER): Payer: Self-pay

## 2023-12-28 NOTE — Telephone Encounter (Signed)
-----   Message from Eilleen Grates sent at 12/28/2023  9:01 AM EDT -----   ----- Message ----- From: SYSTEM Sent: 12/28/2023  12:15 AM EDT To: Eilleen Grates, MD

## 2024-01-19 ENCOUNTER — Ambulatory Visit: Admitting: Urology

## 2024-02-16 DIAGNOSIS — N184 Chronic kidney disease, stage 4 (severe): Secondary | ICD-10-CM | POA: Diagnosis not present

## 2024-02-17 ENCOUNTER — Other Ambulatory Visit (HOSPITAL_COMMUNITY): Payer: Self-pay | Admitting: Cardiology

## 2024-02-17 DIAGNOSIS — I4819 Other persistent atrial fibrillation: Secondary | ICD-10-CM

## 2024-02-17 NOTE — Telephone Encounter (Signed)
 Prescription refill request for Eliquis  received. Indication: Afib Last office visit: 12/23/23 (Hochrein)  Scr: 2.28 (08/09/23)  Age: 78 Weight: 81.5kg  Appropriate dose. Refill sent.

## 2024-02-21 DIAGNOSIS — R600 Localized edema: Secondary | ICD-10-CM | POA: Diagnosis not present

## 2024-02-21 DIAGNOSIS — I129 Hypertensive chronic kidney disease with stage 1 through stage 4 chronic kidney disease, or unspecified chronic kidney disease: Secondary | ICD-10-CM | POA: Diagnosis not present

## 2024-02-21 DIAGNOSIS — N2581 Secondary hyperparathyroidism of renal origin: Secondary | ICD-10-CM | POA: Diagnosis not present

## 2024-02-21 DIAGNOSIS — N184 Chronic kidney disease, stage 4 (severe): Secondary | ICD-10-CM | POA: Diagnosis not present

## 2024-02-21 DIAGNOSIS — I701 Atherosclerosis of renal artery: Secondary | ICD-10-CM | POA: Diagnosis not present

## 2024-02-21 DIAGNOSIS — D631 Anemia in chronic kidney disease: Secondary | ICD-10-CM | POA: Diagnosis not present

## 2024-02-21 DIAGNOSIS — E876 Hypokalemia: Secondary | ICD-10-CM | POA: Diagnosis not present

## 2024-02-23 ENCOUNTER — Encounter: Payer: Self-pay | Admitting: Family Medicine

## 2024-02-23 ENCOUNTER — Ambulatory Visit (INDEPENDENT_AMBULATORY_CARE_PROVIDER_SITE_OTHER): Admitting: Family Medicine

## 2024-02-23 VITALS — BP 174/68 | HR 85 | Temp 97.9°F | Ht 70.0 in | Wt 185.6 lb

## 2024-02-23 DIAGNOSIS — N184 Chronic kidney disease, stage 4 (severe): Secondary | ICD-10-CM

## 2024-02-23 DIAGNOSIS — F01B0 Vascular dementia, moderate, without behavioral disturbance, psychotic disturbance, mood disturbance, and anxiety: Secondary | ICD-10-CM | POA: Diagnosis not present

## 2024-02-23 DIAGNOSIS — Z125 Encounter for screening for malignant neoplasm of prostate: Secondary | ICD-10-CM

## 2024-02-23 DIAGNOSIS — I5042 Chronic combined systolic (congestive) and diastolic (congestive) heart failure: Secondary | ICD-10-CM | POA: Diagnosis not present

## 2024-02-23 DIAGNOSIS — E785 Hyperlipidemia, unspecified: Secondary | ICD-10-CM

## 2024-02-23 DIAGNOSIS — I10 Essential (primary) hypertension: Secondary | ICD-10-CM | POA: Diagnosis not present

## 2024-02-23 DIAGNOSIS — I5033 Acute on chronic diastolic (congestive) heart failure: Secondary | ICD-10-CM

## 2024-02-23 MED ORDER — METOPROLOL TARTRATE 50 MG PO TABS: 50.0000 mg | ORAL_TABLET | Freq: Two times a day (BID) | ORAL | 3 refills | Status: AC

## 2024-02-23 MED ORDER — DONEPEZIL HCL 5 MG PO TABS: 5.0000 mg | ORAL_TABLET | Freq: Every day | ORAL | 3 refills | Status: AC

## 2024-02-23 MED ORDER — AMLODIPINE-ATORVASTATIN 10-40 MG PO TABS: 1.0000 | ORAL_TABLET | Freq: Every day | ORAL | 3 refills | Status: AC

## 2024-02-23 MED ORDER — IRON (FERROUS SULFATE) 325 (65 FE) MG PO TABS: 325.0000 mg | ORAL_TABLET | Freq: Every day | ORAL | 3 refills | Status: AC

## 2024-02-23 NOTE — Progress Notes (Signed)
 BP (!) 174/68   Pulse 85   Temp 97.9 F (36.6 C) (Temporal)   Ht 5' 10 (1.778 m)   Wt 185 lb 9.6 oz (84.2 kg)   SpO2 98%   BMI 26.63 kg/m    Subjective:   Patient ID: James [name], male    DOB: 1946/02/01, 78 y.o.   MRN: [id number]  HPI: James [name] is a 78 y.o. male presenting on 02/23/2024 for Medical Management of Chronic Issues   HPI Hypertension and CKD recheck and A-fib Patient is currently on Eliquis  and doxazosin  and clonidine  and torsemide  and amlodipine  and metoprolol , and their blood pressure today is 174/68, he was just at a doctor's office couple weeks ago and it was 140s over 60s.. Patient denies any lightheadedness or dizziness. Patient denies headaches, blurred vision, chest pains, shortness of breath, or weakness. Denies any side effects from medication and is content with current medication.   Hyperlipidemia Patient is coming in for recheck of his hyperlipidemia. The patient is currently taking atorvastatin . They deny any issues with myalgias or history of liver damage from it. They deny any focal numbness or weakness or chest pain.   Dementia recheck Patient is coming in today for dementia recheck and is currently taking Aricept .  Patient has been stable and not really worsened significantly.  Likely vascular dementia   Relevant past medical, surgical, family and social history reviewed and updated as indicated. Interim medical history since our last visit reviewed. Allergies and medications reviewed and updated.  Review of Systems  Constitutional:  Negative for chills and fever.  Eyes:  Negative for visual disturbance.  Respiratory:  Negative for shortness of breath and wheezing.   Cardiovascular:  Positive for leg swelling. Negative for chest pain.  Genitourinary:  Positive for frequency.  Musculoskeletal:  Negative for back pain and gait problem.  Skin:  Negative for rash.  All other systems reviewed and are negative.   Per HPI unless  specifically indicated above   Allergies as of 02/23/2024       Reactions   Plavix [clopidogrel Bisulfate] Other (See Comments)   GI Bleed   Uloric  [febuxostat ] Other (See Comments)   Unknown reaction        Medication List        Accurate as of February 23, 2024  4:03 PM. If you have any questions, ask your nurse or doctor.          allopurinol  100 MG tablet Commonly known as: ZYLOPRIM  Take 2 tablets (200 mg total) by mouth daily.   amLODipine -atorvastatin  10-40 MG tablet Commonly known as: Caduet  Take 1 tablet by mouth daily.   cloNIDine  0.3 MG tablet Commonly known as: CATAPRES  Take 0.3 mg by mouth 3 (three) times daily.   donepezil  5 MG tablet Commonly known as: ARICEPT  Take 1 tablet (5 mg total) by mouth at bedtime.   doxazosin  2 MG tablet Commonly known as: CARDURA  Take 4 mg by mouth 2 (two) times daily.   Eliquis  5 MG Tabs tablet Generic drug: apixaban  TAKE 1 TABLET TWICE A DAY   ipratropium-albuterol  0.5-2.5 (3) MG/3ML Soln Commonly known as: DUONEB Take 3 mLs by nebulization every 6 (six) hours as needed.   Iron  (Ferrous Sulfate ) 325 (65 Fe) MG Tabs Take 325 mg by mouth daily.   metoprolol  tartrate 50 MG tablet Commonly known as: LOPRESSOR  Take 1 tablet (50 mg total) by mouth 2 (two) times daily.   potassium chloride  10 MEQ CR capsule Commonly known as:  MICRO-K  Take 10 mEq by mouth daily.   sertraline  50 MG tablet Commonly known as: ZOLOFT  Take 1 tablet (50 mg total) by mouth daily.   torsemide  20 MG tablet Commonly known as: DEMADEX  Take 4 tablets (80 mg total) by mouth 2 (two) times daily.   Vitamin D3 50 MCG (2000 UT) Tabs Take 2,000 Units by mouth daily.         Objective:   BP (!) 174/68   Pulse 85   Temp 97.9 F (36.6 C) (Temporal)   Ht 5' 10 (1.778 m)   Wt 185 lb 9.6 oz (84.2 kg)   SpO2 98%   BMI 26.63 kg/m   Wt Readings from Last 3 Encounters:  02/23/24 185 lb 9.6 oz (84.2 kg)  12/23/23 179 lb 9.6 oz (81.5 kg)   12/04/23 189 lb (85.7 kg)    Physical Exam Vitals and nursing note reviewed.  Constitutional:      General: He is not in acute distress.    Appearance: He is well-developed. He is not diaphoretic.  Eyes:     General: No scleral icterus.    Conjunctiva/sclera: Conjunctivae normal.  Neck:     Thyroid : No thyromegaly.  Cardiovascular:     Rate and Rhythm: Normal rate and regular rhythm.     Heart sounds: Normal heart sounds. No murmur heard. Pulmonary:     Effort: Pulmonary effort is normal. No respiratory distress.     Breath sounds: Normal breath sounds. No wheezing.  Musculoskeletal:        General: Swelling (3+ peripheral edema) present. Normal range of motion.     Cervical back: Neck supple.  Lymphadenopathy:     Cervical: No cervical adenopathy.  Skin:    General: Skin is warm and dry.     Findings: No rash.  Neurological:     Mental Status: He is alert and oriented to person, place, and time.     Coordination: Coordination normal.  Psychiatric:        Behavior: Behavior normal.       Assessment & Plan:   Problem List Items Addressed This Visit       Cardiovascular and Mediastinum   Hypertension - Primary   Relevant Medications   amLODipine -atorvastatin  (CADUET ) 10-40 MG tablet   metoprolol  tartrate (LOPRESSOR ) 50 MG tablet   CHF (congestive heart failure) (HCC)   Relevant Medications   amLODipine -atorvastatin  (CADUET ) 10-40 MG tablet   metoprolol  tartrate (LOPRESSOR ) 50 MG tablet   Other Relevant Orders   TSH     Nervous and Auditory   Dementia (HCC)   Relevant Medications   donepezil  (ARICEPT ) 5 MG tablet     Genitourinary   CKD (chronic kidney disease) stage 4, GFR 15-29 ml/min (HCC)   Relevant Orders   Lipid panel   TSH   PSA, total and free     Other   Hyperlipidemia LDL goal <100   Relevant Medications   amLODipine -atorvastatin  (CADUET ) 10-40 MG tablet   metoprolol  tartrate (LOPRESSOR ) 50 MG tablet   Other Relevant Orders   Lipid panel    Other Visit Diagnoses       Acute on chronic diastolic CHF (congestive heart failure) (HCC)       Relevant Medications   amLODipine -atorvastatin  (CADUET ) 10-40 MG tablet   metoprolol  tartrate (LOPRESSOR ) 50 MG tablet     Prostate cancer screening       Relevant Orders   PSA, total and free       Blood pressure has  been stable, there is really not much else we can add, continue current medicine. Follow up plan: Return if symptoms worsen or fail to improve, for 3 to 51-month hypertension and CKD.  Counseling provided for all of the vaccine components Orders Placed This Encounter  Procedures   Lipid panel   TSH   PSA, total and free    Fonda Levins, MD [facility] 02/23/2024, 4:03 PM

## 2024-02-24 LAB — LIPID PANEL
Chol/HDL Ratio: 2.2 ratio (ref 0.0–5.0)
Cholesterol, Total: 107 mg/dL (ref 100–199)
HDL: 48 mg/dL (ref >39)
LDL Chol Calc (NIH): 43 mg/dL (ref 0–99)
Triglycerides: 76 mg/dL (ref 0–149)
VLDL Cholesterol Cal: 16 mg/dL (ref 5–40)

## 2024-02-24 LAB — PSA, TOTAL AND FREE
PSA, Free Pct: 24.4 %
PSA, Free: 0.66 ng/mL
Prostate Specific Ag, Serum: 2.7 ng/mL (ref 0.0–4.0)

## 2024-02-24 LAB — TSH: TSH: 1.33 u[IU]/mL (ref 0.450–4.500)

## 2024-02-28 ENCOUNTER — Ambulatory Visit: Admitting: Urology

## 2024-03-01 ENCOUNTER — Ambulatory Visit: Payer: Self-pay | Admitting: Family Medicine

## 2024-03-22 ENCOUNTER — Ambulatory Visit: Admitting: Urology

## 2024-04-02 ENCOUNTER — Ambulatory Visit
Admission: EM | Admit: 2024-04-02 | Discharge: 2024-04-02 | Disposition: A | Discharge status: Home / Self Care | Attending: Nurse Practitioner | Admitting: Nurse Practitioner

## 2024-04-02 DIAGNOSIS — L03115 Cellulitis of right lower limb: Secondary | ICD-10-CM | POA: Diagnosis not present

## 2024-04-02 MED ORDER — AMOXICILLIN 500 MG PO TABS: 500.0000 mg | ORAL_TABLET | Freq: Two times a day (BID) | ORAL | 0 refills | Status: AC

## 2024-04-02 MED ORDER — CEFTRIAXONE SODIUM 1 G IJ SOLR
1.0000 g | Freq: Once | INTRAMUSCULAR | Status: AC
  Administered 2024-04-02: 1 g via INTRAMUSCULAR

## 2024-04-02 MED ORDER — DOXYCYCLINE HYCLATE 100 MG PO CAPS: 100.0000 mg | ORAL_CAPSULE | Freq: Two times a day (BID) | ORAL | 0 refills | Status: AC

## 2024-04-02 NOTE — Discharge Instructions (Signed)
 You were seen today for an abrasion on your right lower leg that has developed redness extending into the thigh. The findings are consistent with cellulitis, which is a skin infection. You received an antibiotic injection (Rocephin ) in the clinic today, and you have been prescribed two antibiotics to take at home: doxycycline  100 mg twice a day for 10 days and amoxicillin  500 mg twice a day, which has been adjusted to be safe with your kidney function. It is very important to take both medications exactly as directed and to finish the full course even if you start to feel better.  At home, keep the affected leg clean and dry. You may gently wash with mild soap and water once daily, then pat dry. Avoid scratching, rubbing, or applying ointments or creams unless specifically prescribed. Elevating your leg when sitting or lying down can help with swelling. For discomfort, you may use Tylenol  as directed, but avoid NSAIDs due to your kidney issues and being on Eliquis .   You should contact your primary care provider tomorrow to schedule a follow-up visit later this week to monitor your progress. Go to the emergency department right away if you notice the redness spreading quickly, increasing pain, new swelling that is worse than usual, fever, chills, confusion, or if you feel suddenly unwell. These can be signs that the infection is worsening or spreading.

## 2024-04-02 NOTE — ED Triage Notes (Signed)
 Pt reports right leg cut on the bellow the knee, appeared last night swelling and redness present in the leg. Started yesterday.

## 2024-04-02 NOTE — ED Provider Notes (Signed)
 [facility]    CSN: [id number] Arrival date & time: 04/02/24  1141      History   Chief Complaint No chief complaint on file.   HPI James [name] is a 78 y.o. male.   Discussed the use of AI scribe software for clinical note transcription with the patient, who gave verbal consent to proceed.   History is provided by the patient, his wife, and his son, with the son serving as the most reliable historian for the current complaint and past medical history. The patient has a history of hypertension, stage IV chronic kidney disease, atrial fibrillation on Eliquis , hyperlipidemia, dementia, and congestive heart failure. He presents with redness of the right lower leg and thigh, first noticed by his son yesterday. Approximately one week ago, the patient sustained an abrasion to the right lower leg while working in the yard, caused by a tree limb while cutting bushes. The redness has remained unchanged since onset.  The patient has a known history of chronic fluid retention and wears compression stockings due to kidney disease and heart failure. His lower extremity swelling is baseline, equal bilaterally, and not increased compared to usual. His son reports a gradual weight gain of 10-15 pounds over the past couple of weeks, though daily weights have not shown any acute 3-5 pound increases within 1-2 days. His appetite has also been greater recently.  The patient denies chest pain, shortness of breath, palpitations, leg pain, fevers, or feeling generally unwell.    Past Medical History:  Diagnosis Date   Arthritis    Carotid artery occlusion    Chronic kidney disease    DVT (deep venous thrombosis) (HCC) 2008   GERD (gastroesophageal reflux disease)    takes Protonix  daily   Gout    takes Allopurinol  daily and Indomethacin  prn   H/O renal artery stenosis    H/O: GI bleed    History of blood transfusion    History of colon polyps    History of gastric ulcer     Hyperlipidemia    takes Lipitor daily   Hypertension    takes Metoprolol  and Amlodipine  daily   Joint pain    Joint swelling    Sleep apnea     Patient Active Problem List   Diagnosis Date Noted   Paroxysmal atrial fibrillation (HCC) 06/25/2023   CHF (congestive heart failure) (HCC) 06/25/2023   COPD (chronic obstructive pulmonary disease) (HCC) 06/25/2023   Iron  deficiency anemia 06/25/2023   Dementia (HCC) 06/25/2023   Hypercoagulable state due to persistent atrial fibrillation (HCC) 10/22/2022   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   Pulmonary nodules 08/30/2022   CKD (chronic kidney disease) stage 4, GFR 15-29 ml/min (HCC) 08/30/2022   Bilateral carotid artery stenosis 11/17/2021   Pericardial effusion 10/13/2020   OSA on CPAP 03/09/2016   Hypersomnia 01/07/2016   Exertional dyspnea 01/07/2016   Gastroesophageal reflux disease with esophagitis 07/17/2015   Accelerated secondary hypertension 07/17/2015   S/P arterial stent 04/29/2015   Renal artery stenosis (HCC) 12/26/2012   Hypertension 10/11/2012   Hyperlipidemia LDL goal <100 10/11/2012   Gout 10/11/2012   Occlusion and stenosis of carotid artery without mention of cerebral infarction 10/12/2011    Past Surgical History:  Procedure Laterality Date   CARDIOVERSION N/A 11/18/2022   Procedure: CARDIOVERSION;  Surgeon: Raford Riggs, MD;  Location: [facility] INVASIVE CV LAB;  Service: Cardiovascular;  Laterality: N/A;   CAROTID ENDARTERECTOMY  2008   right CEA   CHOLECYSTECTOMY  mid  90's   COLON SURGERY     COLONOSCOPY     ENDARTERECTOMY  06/02/2012   Procedure: ENDARTERECTOMY CAROTID;  Surgeon: Gaile LELON New, MD;  Location: [facility] OR;  Service: Vascular;  Laterality: Left;   ESOPHAGOGASTRODUODENOSCOPY     HERNIA REPAIR     umbilical hernia   PATCH ANGIOPLASTY  06/02/2012   Procedure: PATCH ANGIOPLASTY;  Surgeon: Gaile LELON New, MD;  Location: [facility] OR;  Service: Vascular;  Laterality: Left;  using Vascu-Guard Patch    PERIPHERAL VASCULAR CATHETERIZATION Right 12/03/2015   Procedure: Peripheral Vascular Balloon Angioplasty;  Surgeon: Gaile LELON New, MD;  Location: [facility] INVASIVE CV LAB;  Service: Cardiovascular;  Laterality: Right;  RENAL unsuccessful   RENAL ARTERY STENT  2009   Right renal artery stenting-  Right Kidney   small intestine removed  2001   d/t random gi bleeding   SUBXYPHOID PERICARDIAL WINDOW N/A 09/23/2022   Procedure: SUBXYPHOID PERICARDIAL WINDOW;  Surgeon: Kerrin Elspeth BROCKS, MD;  Location: [facility] OR;  Service: Thoracic;  Laterality: N/A;   TEE WITHOUT CARDIOVERSION N/A 09/23/2022   Procedure: TRANSESOPHAGEAL ECHOCARDIOGRAM;  Surgeon: Kerrin Elspeth BROCKS, MD;  Location: [facility] OR;  Service: Thoracic;  Laterality: N/A;       Home Medications    Prior to Admission medications   Medication Sig Start Date End Date Taking? Authorizing Provider  amoxicillin  (AMOXIL ) 500 MG tablet Take 1 tablet (500 mg total) by mouth 2 (two) times daily for 10 days. 04/02/24 04/12/24 Yes Iola Lukes, FNP  doxycycline  (VIBRAMYCIN ) 100 MG capsule Take 1 capsule (100 mg total) by mouth 2 (two) times daily for 10 days. 04/02/24 04/12/24 Yes Iola Lukes, FNP  allopurinol  (ZYLOPRIM ) 100 MG tablet Take 2 tablets (200 mg total) by mouth daily. 11/24/23   [name], Fonda LABOR, MD  amLODipine -atorvastatin  (CADUET ) 10-40 MG tablet Take 1 tablet by mouth daily. 02/23/24   [name], Fonda LABOR, MD  apixaban  (ELIQUIS ) 5 MG TABS tablet TAKE 1 TABLET TWICE A DAY 02/17/24   Lavona Agent, MD  Cholecalciferol  (VITAMIN D3) 50 MCG (2000 UT) TABS Take 2,000 Units by mouth daily.    [provider]  cloNIDine  (CATAPRES ) 0.3 MG tablet Take 0.3 mg by mouth 3 (three) times daily.    [provider]  donepezil  (ARICEPT ) 5 MG tablet Take 1 tablet (5 mg total) by mouth at bedtime. 02/23/24   [name], Fonda LABOR, MD  doxazosin  (CARDURA ) 2 MG tablet Take 4 mg by mouth 2 (two) times daily. 08/24/19   [provider]  ipratropium-albuterol  (DUONEB) 0.5-2.5 (3) MG/3ML SOLN Take 3 mLs by nebulization every 6 (six) hours as needed. 06/28/23   Arrien, Elidia Sieving, MD  Iron , Ferrous Sulfate , 325 (65 Fe) MG TABS Take 325 mg by mouth daily. 02/23/24   [name], Fonda LABOR, MD  metoprolol  tartrate (LOPRESSOR ) 50 MG tablet Take 1 tablet (50 mg total) by mouth 2 (two) times daily. 02/23/24 03/24/24  [name], Fonda LABOR, MD  potassium chloride  (MICRO-K ) 10 MEQ CR capsule Take 10 mEq by mouth daily. 12/10/19   [provider]  sertraline  (ZOLOFT ) 50 MG tablet Take 1 tablet (50 mg total) by mouth daily. 11/24/23   [name], Fonda LABOR, MD  torsemide  (DEMADEX ) 20 MG tablet Take 4 tablets (80 mg total) by mouth 2 (two) times daily. 08/09/23   [name], Fonda LABOR, MD    Family History Family History  Problem Relation Age of Onset   Hypertension Mother    Hyperlipidemia Mother    Other Mother  varicose veins   Hyperlipidemia Father    Hypertension Father    Diabetes Father    Clotting disorder Father    Deep vein thrombosis Father    Hypertension Sister    Diabetes Sister    Hyperlipidemia Sister    Varicose Veins Sister    Other Sister        Bleeding problems   Hypertension Brother    Hyperlipidemia Brother     Social History Social History   Tobacco Use   Smoking status: Former    Current packs/day: 0.00    Average packs/day: 1.5 packs/day for 36.0 years (54.0 ttl pk-yrs)    Types: Cigarettes    Start date: 12/26/1963    Quit date: 12/26/1999    Years since quitting: 24.2   Smokeless tobacco: Never  Vaping Use   Vaping status: Never Used  Substance Use Topics   Alcohol use: No    Comment: Quit in 1998   Drug use: No     Allergies   Plavix [clopidogrel bisulfate] and Uloric  [febuxostat ]   Review of Systems Review of Systems  Constitutional:  Negative for appetite change and fever.  Cardiovascular:  Negative for chest pain.  Gastrointestinal:  Negative for abdominal pain,  diarrhea and vomiting.  Skin:  Positive for color change and wound.  All other systems reviewed and are negative.    Physical Exam Triage Vital Signs ED Triage Vitals  Encounter Vitals Group     BP 04/02/24 1237 (!) 165/71     Girls Systolic BP Percentile --      Girls Diastolic BP Percentile --      Boys Systolic BP Percentile --      Boys Diastolic BP Percentile --      Pulse --      Resp 04/02/24 1237 20     Temp 04/02/24 1237 98.3 F (36.8 C)     Temp Source 04/02/24 1237 Oral     SpO2 04/02/24 1237 94 %     Weight --      Height --      Head Circumference --      Peak Flow --      Pain Score 04/02/24 1240 0     Pain Loc --      Pain Education --      Exclude from Growth Chart --    No data found.  Updated Vital Signs BP (!) 165/71 (BP Location: Right Arm)   Temp 98.3 F (36.8 C) (Oral)   Resp 20   SpO2 94%   Visual Acuity Right Eye Distance:   Left Eye Distance:   Bilateral Distance:    Right Eye Near:   Left Eye Near:    Bilateral Near:     Physical Exam Vitals reviewed.  Constitutional:      General: He is awake. He is not in acute distress.    Appearance: Normal appearance. He is well-developed. He is not ill-appearing, toxic-appearing or diaphoretic.  HENT:     Head: Normocephalic.     Right Ear: Hearing normal.     Left Ear: Hearing normal.     Nose: Nose normal.     Mouth/Throat:     Mouth: Mucous membranes are moist.  Eyes:     General: Vision grossly intact.     Conjunctiva/sclera: Conjunctivae normal.  Cardiovascular:     Rate and Rhythm: Normal rate and regular rhythm.     Heart sounds: Normal heart sounds.  Pulmonary:  Effort: Pulmonary effort is normal.     Breath sounds: Normal breath sounds and air entry.  Musculoskeletal:        General: Normal range of motion.     Cervical back: Full passive range of motion without pain, normal range of motion and neck supple.     Right lower leg: Edema present.     Left lower leg: Edema  present.  Skin:    General: Skin is warm and dry.     Findings: Abrasion and erythema present.     Comments: An abrasion is present on the anterior aspect of the right lower leg with surrounding erythema. Additional areas of erythema are observed on the anterior, posterior, lateral, and medial aspects of the right upper and lower leg. There is no increased warmth, no open skin lesions, and no evidence of fluid weeping or drainage (see images below)   Neurological:     General: No focal deficit present.     Mental Status: He is alert and oriented to person, place, and time.  Psychiatric:        Speech: Speech normal.        Behavior: Behavior is cooperative.               UC Treatments / Results  Labs (all labs ordered are listed, but only abnormal results are displayed) Labs Reviewed  CBC WITH DIFFERENTIAL/PLATELET    EKG   Radiology No results found.  Procedures Procedures (including critical care time)  Medications Ordered in UC Medications  cefTRIAXone  (ROCEPHIN ) injection 1 g (1 g Intramuscular Given 04/02/24 1353)    Initial Impression / Assessment and Plan / UC Course  I have reviewed the triage vital signs and the nursing notes.  Pertinent labs & imaging results that were available during my care of the patient were reviewed by me and considered in my medical decision making (see chart for details).     The patient presents with an abrasion to the anterior right lower leg with surrounding erythema extending into the thigh. There is no increased warmth and bilateral lower extremity swelling is present but symmetric and unchanged from baseline. Findings are consistent with acute cellulitis. The patient is afebrile, vital signs are stable, and there is no clinical evidence of sepsis at this time. CBC will be obtained for baseline assessment. Treatment was initiated in clinic with Rocephin  1 g IM. Outpatient therapy will include doxycycline  100 mg twice daily for  10 days, which does not require renal dosing adjustment, and amoxicillin  500 mg twice daily with renal adjustment given GFR of 29. Patient and family were advised to contact the PCP in the morning to arrange an appointment this week for close follow-up. Strict emergency precautions were reviewed, including worsening redness, swelling, systemic symptoms, or new concerns, which should prompt immediate ED evaluation.  Today's evaluation has revealed no signs of a dangerous process. Discussed diagnosis with patient and/or guardian. Patient and/or guardian aware of their diagnosis, possible red flag symptoms to watch out for and need for close follow up. Patient and/or guardian understands verbal and written discharge instructions. Patient and/or guardian comfortable with plan and disposition.  Patient and/or guardian has a clear mental status at this time, good insight into illness (after discussion and teaching) and has clear judgment to make decisions regarding their care  Documentation was completed with the aid of voice recognition software. Transcription may contain typographical errors. Final Clinical Impressions(s) / UC Diagnoses   Final diagnoses:  Cellulitis of  right lower extremity     Discharge Instructions      You were seen today for an abrasion on your right lower leg that has developed redness extending into the thigh. The findings are consistent with cellulitis, which is a skin infection. You received an antibiotic injection (Rocephin ) in the clinic today, and you have been prescribed two antibiotics to take at home: doxycycline  100 mg twice a day for 10 days and amoxicillin  500 mg twice a day, which has been adjusted to be safe with your kidney function. It is very important to take both medications exactly as directed and to finish the full course even if you start to feel better.  At home, keep the affected leg clean and dry. You may gently wash with mild soap and water once daily,  then pat dry. Avoid scratching, rubbing, or applying ointments or creams unless specifically prescribed. Elevating your leg when sitting or lying down can help with swelling. For discomfort, you may use Tylenol  as directed, but avoid NSAIDs due to your kidney issues and being on Eliquis .   You should contact your primary care provider tomorrow to schedule a follow-up visit later this week to monitor your progress. Go to the emergency department right away if you notice the redness spreading quickly, increasing pain, new swelling that is worse than usual, fever, chills, confusion, or if you feel suddenly unwell. These can be signs that the infection is worsening or spreading.     ED Prescriptions     Medication Sig Dispense Auth. Provider   amoxicillin  (AMOXIL ) 500 MG tablet Take 1 tablet (500 mg total) by mouth 2 (two) times daily for 10 days. 20 tablet Iola Lukes, FNP   doxycycline  (VIBRAMYCIN ) 100 MG capsule Take 1 capsule (100 mg total) by mouth 2 (two) times daily for 10 days. 20 capsule Iola Lukes, FNP      PDMP not reviewed this encounter.   Iola Lukes, [place] 04/02/24 1422

## 2024-04-03 ENCOUNTER — Ambulatory Visit (HOSPITAL_COMMUNITY): Payer: Self-pay

## 2024-04-03 LAB — CBC WITH DIFFERENTIAL/PLATELET
Basophils Absolute: 0.1 x10E3/uL (ref 0.0–0.2)
Basos: 1 %
EOS (ABSOLUTE): 0.1 x10E3/uL (ref 0.0–0.4)
Eos: 2 %
Hematocrit: 30.4 % — ABNORMAL LOW (ref 37.5–51.0)
Hemoglobin: 9.7 g/dL — ABNORMAL LOW (ref 13.0–17.7)
Immature Grans (Abs): 0 x10E3/uL (ref 0.0–0.1)
Immature Granulocytes: 0 %
Lymphocytes Absolute: 1.1 x10E3/uL (ref 0.7–3.1)
Lymphs: 16 %
MCH: 27.5 pg (ref 26.6–33.0)
MCHC: 31.9 g/dL (ref 31.5–35.7)
MCV: 86 fL (ref 79–97)
Monocytes Absolute: 0.4 x10E3/uL (ref 0.1–0.9)
Monocytes: 7 %
Neutrophils Absolute: 5 x10E3/uL (ref 1.4–7.0)
Neutrophils: 74 %
Platelets: 286 x10E3/uL (ref 150–450)
RBC: 3.53 x10E6/uL — ABNORMAL LOW (ref 4.14–5.80)
RDW: 14.5 % (ref 11.6–15.4)
WBC: 6.7 x10E3/uL (ref 3.4–10.8)

## 2024-04-03 NOTE — Telephone Encounter (Signed)
 CBC with differential is not concerning for acute infectious process at this time.  Patient does appear to have normocytic anemia.  Further evaluation with primary care provider is recommended.

## 2024-04-10 ENCOUNTER — Encounter: Payer: Self-pay | Admitting: Urology

## 2024-04-10 ENCOUNTER — Ambulatory Visit: Admitting: Urology

## 2024-04-10 VITALS — BP 144/67 | HR 76

## 2024-04-10 DIAGNOSIS — R350 Frequency of micturition: Secondary | ICD-10-CM

## 2024-04-10 DIAGNOSIS — N138 Other obstructive and reflux uropathy: Secondary | ICD-10-CM

## 2024-04-10 DIAGNOSIS — I861 Scrotal varices: Secondary | ICD-10-CM | POA: Diagnosis not present

## 2024-04-10 DIAGNOSIS — N401 Enlarged prostate with lower urinary tract symptoms: Secondary | ICD-10-CM | POA: Diagnosis not present

## 2024-04-10 DIAGNOSIS — S3131XS Laceration without foreign body of scrotum and testes, sequela: Secondary | ICD-10-CM

## 2024-04-10 NOTE — Progress Notes (Unsigned)
 04/10/2024 3:23 PM   Nester Bachus Penner [birth year] [id number]  Referring provider: Albertina Dixon, MD [address] [place],  [place] [address]  No chief complaint on file.   HPI:  New pt-   1) BPH - he has some frequency. No meds or surgery. Patient was seen in 2001 with urgency and hesitancy. His PSA was 2.38. A 2016 PSA was 3.1. He voids with a good flow but had bothersome frequency and urgency.   His AUASS = 17. SHIM = 23. His PVR was 47 ml. F/u AUASS was 6. Cr was 1.84 on 05/15/2019. PSA was 1.9 in 2019. He void with some frequency. Noc x 3-4. Bladder scan 123 ml. Depends on fluid intake. Not bothered. AUASS is 13. PSA was 12/20 3.75 and 07/21 3.2. F/u AUASS was 4.  2) CKD - March 2019 BUN of 19 and cr 1.84. GFR 36. He has a history of renal artery stenosis and the right renal artery stent in 2009. Follow-up Doppler imaging noted that it was patent, but pt told me it was partially stenosed. Renal US  Dec 2019 confirmed RAS and MRI Abd Jan 2020 showed no hydro and a left renal cyst. His Nov 2019 bun was 37 and Cr 2.87. Cr 2.2 10/21 with gfr 33.  His Sep 2023 cr 2.54. His Mar 2024 Cr was 2.6.  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 it stopped. excised jan 2024.   Today seen for the above. Was seen at [facility] in 2024. He had a Scrotal lac  - May 2025 repaired in ED. Jul 2025 PSA 2.7.   Was in hospital for pericardial effusion and had a pericardial window. Needed cardioversion. H/o  atrial fibrillation on Eliquis , hyperlipidemia, dementia, and congestive heart failure, LE edema, compressive stocking use.   His son went to [facility] 2011 - he is a Comptroller. His dad has memory issues .     PMH: Past Medical History:  Diagnosis Date   Arthritis    Carotid artery occlusion    Chronic kidney disease    DVT (deep venous thrombosis) (HCC) 2008   GERD (gastroesophageal reflux disease)    takes Protonix   daily   Gout    takes Allopurinol  daily and Indomethacin  prn   H/O renal artery stenosis    H/O: GI bleed    History of blood transfusion    History of colon polyps    History of gastric ulcer    Hyperlipidemia    takes Lipitor daily   Hypertension    takes Metoprolol  and Amlodipine  daily   Joint pain    Joint swelling    Sleep apnea     Surgical History: Past Surgical History:  Procedure Laterality Date   CARDIOVERSION N/A 11/18/2022   Procedure: CARDIOVERSION;  Surgeon: Raford Riggs, MD;  Location: [facility] INVASIVE CV LAB;  Service: Cardiovascular;  Laterality: N/A;   CAROTID ENDARTERECTOMY  2008   right CEA   CHOLECYSTECTOMY  mid 90's   COLON SURGERY     COLONOSCOPY     ENDARTERECTOMY  06/02/2012   Procedure: ENDARTERECTOMY CAROTID;  Surgeon: Gaile LELON New, MD;  Location: [facility] OR;  Service: Vascular;  Laterality: Left;   ESOPHAGOGASTRODUODENOSCOPY     HERNIA REPAIR     umbilical hernia   PATCH ANGIOPLASTY  06/02/2012   Procedure: PATCH ANGIOPLASTY;  Surgeon: Gaile LELON New, MD;  Location: [facility] OR;  Service: Vascular;  Laterality: Left;  using Vascu-Guard Patch   PERIPHERAL VASCULAR CATHETERIZATION Right 12/03/2015   Procedure: Peripheral Vascular Balloon Angioplasty;  Surgeon: Gaile LELON New, MD;  Location: [facility] INVASIVE CV LAB;  Service: Cardiovascular;  Laterality: Right;  RENAL unsuccessful   RENAL ARTERY STENT  2009   Right renal artery stenting-  Right Kidney   small intestine removed  2001   d/t random gi bleeding   SUBXYPHOID PERICARDIAL WINDOW N/A 09/23/2022   Procedure: SUBXYPHOID PERICARDIAL WINDOW;  Surgeon: Kerrin Elspeth BROCKS, MD;  Location: [facility] OR;  Service: Thoracic;  Laterality: N/A;   TEE WITHOUT CARDIOVERSION N/A 09/23/2022   Procedure: TRANSESOPHAGEAL ECHOCARDIOGRAM;  Surgeon: Kerrin Elspeth BROCKS, MD;  Location: [facility] OR;  Service: Thoracic;  Laterality: N/A;    Home Medications:  Allergies as of 04/10/2024       Reactions   Plavix [clopidogrel Bisulfate]  Other (See Comments)   GI Bleed   Uloric  [febuxostat ] Other (See Comments)   Unknown reaction        Medication List        Accurate as of April 10, 2024  3:23 PM. If you have any questions, ask your nurse or doctor.          PAUSE taking these medications    Iron  (Ferrous Sulfate ) 325 (65 Fe) MG Tabs Wait to take this until: April 13, 2024 Morning Take 325 mg by mouth daily.       TAKE these medications    allopurinol  100 MG tablet Commonly known as: ZYLOPRIM  Take 2 tablets (200 mg total) by mouth daily.   amLODipine -atorvastatin  10-40 MG tablet Commonly known as: Caduet  Take 1 tablet by mouth daily.   amoxicillin  500 MG tablet Commonly known as: AMOXIL  Take 1 tablet (500 mg total) by mouth 2 (two) times daily for 10 days.   cloNIDine  0.3 MG tablet Commonly known as: CATAPRES  Take 0.3 mg by mouth 3 (three) times daily.   donepezil  5 MG tablet Commonly known as: ARICEPT  Take 1 tablet (5 mg total) by mouth at bedtime.   doxazosin  2 MG tablet Commonly known as: CARDURA  Take 4 mg by mouth 2 (two) times daily.   doxycycline  100 MG capsule Commonly known as: VIBRAMYCIN  Take 1 capsule (100 mg total) by mouth 2 (two) times daily for 10 days.   Eliquis  5 MG Tabs tablet Generic drug: apixaban  TAKE 1 TABLET TWICE A DAY   ipratropium-albuterol  0.5-2.5 (3) MG/3ML Soln Commonly known as: DUONEB Take 3 mLs by nebulization every 6 (six) hours as needed.   metoprolol  tartrate 50 MG tablet Commonly known as: LOPRESSOR  Take 1 tablet (50 mg total) by mouth 2 (two) times daily.   potassium chloride  10 MEQ CR capsule Commonly known as: MICRO-K  Take 10 mEq by mouth daily.   sertraline  50 MG tablet Commonly known as: ZOLOFT  Take 1 tablet (50 mg total) by mouth daily.   torsemide  20 MG tablet Commonly known as: DEMADEX  Take 4 tablets (80 mg total) by mouth 2 (two) times daily.   Vitamin D3 50 MCG (2000 UT) Tabs Take 2,000 Units by mouth daily.         Allergies:  Allergies  Allergen Reactions   Plavix [Clopidogrel Bisulfate] Other (See Comments)    GI Bleed   Uloric  [Febuxostat ] Other (See Comments)    Unknown reaction    Family History: Family History  Problem Relation Age of Onset   Hypertension Mother    Hyperlipidemia Mother    Other Mother  varicose veins   Hyperlipidemia Father    Hypertension Father    Diabetes Father    Clotting disorder Father    Deep vein thrombosis Father    Hypertension Sister    Diabetes Sister    Hyperlipidemia Sister    Varicose Veins Sister    Other Sister        Bleeding problems   Hypertension Brother    Hyperlipidemia Brother     Social History:  reports that he quit smoking about 24 years ago. His smoking use included cigarettes. He started smoking about 60 years ago. He has a 54 pack-year smoking history. He has never used smokeless tobacco. He reports that he does not drink alcohol and does not use drugs.   Physical Exam: BP (!) 144/67   Pulse 76   Constitutional:  Alert and oriented, No acute distress. HEENT: [place] AT, moist mucus membranes.  Trachea midline, no masses. Cardiovascular: No clubbing, cyanosis, or edema. Respiratory: Normal respiratory effort, no increased work of breathing. GI: Abdomen is soft, nontender, nondistended, no abdominal masses GU: No CVA tenderness Skin: No rashes, bruises or suspicious lesions. Neurologic: Grossly intact, no focal deficits, moving all 4 extremities. Psychiatric: Normal mood and affect. GU: normal foreskin, multiple scrotal varices. No lesions or bleeding.   Laboratory Data: Lab Results  Component Value Date   WBC 6.7 04/02/2024   HGB 9.7 (L) 04/02/2024   HCT 30.4 (L) 04/02/2024   MCV 86 04/02/2024   PLT 286 04/02/2024    Lab Results  Component Value Date   CREATININE 2.28 (H) 08/09/2023    Lab Results  Component Value Date   PSA 2.5 06/12/2014   PSA 3.0 04/24/2013    No results found for:  TESTOSTERONE  Lab Results  Component Value Date   HGBA1C 6.1 (H) 01/16/2023    Urinalysis    Component Value Date/Time   COLORURINE YELLOW 01/15/2023 2129   APPEARANCEUR CLEAR 01/15/2023 2129   LABSPEC 1.006 01/15/2023 2129   PHURINE 7.0 01/15/2023 2129   GLUCOSEU NEGATIVE 01/15/2023 2129   HGBUR MODERATE (A) 01/15/2023 2129   BILIRUBINUR NEGATIVE 01/15/2023 2129   BILIRUBINUR NEG 06/12/2014 1131   KETONESUR 5 (A) 01/15/2023 2129   PROTEINUR 30 (A) 01/15/2023 2129   UROBILINOGEN negative 06/12/2014 1131   UROBILINOGEN 1.0 05/23/2012 1534   NITRITE NEGATIVE 01/15/2023 2129   LEUKOCYTESUR NEGATIVE 01/15/2023 2129    Lab Results  Component Value Date   LABMICR 2,616.8 06/09/2017   MUCUS NEG 06/12/2014   BACTERIA NONE SEEN 01/15/2023    Pertinent Imaging:  Results for orders placed during the hospital encounter of 12/14/07  US  Renal  Narrative Clinical Data: Increased renal function values.  Hypertension.  RENAL/URINARY TRACT ULTRASOUND  Technique:  Complete ultrasound examination of the urinary tract was performed including evaluation of the kidneys renal collecting systems and urinary bladder.  Comparison: None.  Findings: The right and left kidneys measure 8.4 cm and 10.4 cm in length, respectively.  Renal parenchymal echogenicity is normal. Subtle thinning of right renal cortex compared to the left kidney. 1.1 x 1.2 x 1.5 cm cyst arising from the upper pole of the right kidney.  Bladder unremarkable.  Bilateral ureteral jets are incidentally noted.  IMPRESSION: There is a discrepancy in renal size with the right kidney measuring 2 cm less in length than the left kidney.  Right renal arterial stenosis would be a consideration.  Small right renal cyst.  Provider: Emelia Combs    Assessment & Plan:  Bph, LUTS - stable. PSA low and normal.   Scrotal varices - benign. Cautioned when he grooms not to cut one of them. Will bleed on the OAC.   No  follow-ups on file.  Donnice Brooks, MD  [facility]  [address] [place], [place] [address] [phone]

## 2024-05-29 ENCOUNTER — Ambulatory Visit (INDEPENDENT_AMBULATORY_CARE_PROVIDER_SITE_OTHER): Payer: Self-pay | Admitting: Family Medicine

## 2024-05-29 ENCOUNTER — Encounter: Payer: Self-pay | Admitting: Family Medicine

## 2024-05-29 VITALS — BP 164/77 | HR 74 | Ht 70.0 in | Wt 189.0 lb

## 2024-05-29 DIAGNOSIS — I5042 Chronic combined systolic (congestive) and diastolic (congestive) heart failure: Secondary | ICD-10-CM | POA: Diagnosis not present

## 2024-05-29 DIAGNOSIS — I159 Secondary hypertension, unspecified: Secondary | ICD-10-CM

## 2024-05-29 DIAGNOSIS — E785 Hyperlipidemia, unspecified: Secondary | ICD-10-CM

## 2024-05-29 DIAGNOSIS — I10 Essential (primary) hypertension: Secondary | ICD-10-CM

## 2024-05-29 DIAGNOSIS — N184 Chronic kidney disease, stage 4 (severe): Secondary | ICD-10-CM

## 2024-05-29 DIAGNOSIS — I480 Paroxysmal atrial fibrillation: Secondary | ICD-10-CM

## 2024-05-29 DIAGNOSIS — Z23 Encounter for immunization: Secondary | ICD-10-CM

## 2024-05-29 MED ORDER — SERTRALINE HCL 100 MG PO TABS: 100.0000 mg | ORAL_TABLET | Freq: Every day | ORAL | 3 refills | Status: AC

## 2024-05-29 NOTE — Addendum Note (Signed)
 Addended by: MARYANNE CHEW on: 05/29/2024 04:21 PM   Modules accepted: Orders

## 2024-05-29 NOTE — Progress Notes (Signed)
 BP (!) 164/77   Pulse 74   Ht 5' 10 (1.778 m)   Wt 189 lb (85.7 kg)   SpO2 98%   BMI 27.12 kg/m    Subjective:   Patient ID: James [name], male    DOB: [birth year], 78 y.o.   MRN: [id number]  HPI: James [name] is a 78 y.o. male presenting on 05/29/2024 for Medical Management of Chronic Issues and Hypertension   Discussed the use of AI scribe software for clinical note transcription with the patient, who gave verbal consent to proceed.  History of Present Illness   James [name] is a 78 year old male with hypertension and congestive heart failure who presents for a follow-up visit. He is accompanied by an unnamed individual who provides support during the visit.  Hypertension - Blood pressure readings at home fluctuate between 140 to 160 mmHg, depending on activity level - Recent blood pressure measured around 140 mmHg during a visit to another physician - Currently taking multiple antihypertensive medications, contributing to a total of approximately thirty pills daily - Expresses frustration regarding the high number of medications required  Congestive heart failure and volume status - Weight fluctuates between 175 to 185 pounds, with recent increases prompting adjustments in diuretic therapy - History of significant weight gain requiring increased diuretic dosing - Monitors diet closely, particularly salt intake, to manage fluid status - Avoids salty foods such as ham and fried items - Lower extremity edema present  Psychiatric symptoms - Treated for anxiety and depression with sertraline  (Zoloft ) - Uncertain benefit from sertraline , describing effect as 'hard to tell' - Experiences aggravation, which he attributes to his current medication regimen          Relevant past medical, surgical, family and social history reviewed and updated as indicated. Interim medical history since our last visit reviewed. Allergies and medications reviewed and  updated.  Review of Systems  Constitutional:  Negative for chills and fever.  Eyes:  Negative for visual disturbance.  Respiratory:  Negative for shortness of breath and wheezing.   Cardiovascular:  Positive for leg swelling. Negative for chest pain and palpitations.  Musculoskeletal:  Negative for back pain and gait problem.  Skin:  Negative for rash.  Neurological:  Negative for dizziness and light-headedness.  Psychiatric/Behavioral:  Positive for agitation and decreased concentration. Negative for dysphoric mood.   All other systems reviewed and are negative.   Per HPI unless specifically indicated above   Allergies as of 05/29/2024       Reactions   Plavix [clopidogrel Bisulfate] Other (See Comments)   GI Bleed   Uloric  [febuxostat ] Other (See Comments)   Unknown reaction        Medication List        Accurate as of May 29, 2024  4:18 PM. If you have any questions, ask your nurse or doctor.          allopurinol  100 MG tablet Commonly known as: ZYLOPRIM  Take 2 tablets (200 mg total) by mouth daily.   amLODipine -atorvastatin  10-40 MG tablet Commonly known as: Caduet  Take 1 tablet by mouth daily.   cloNIDine  0.3 MG tablet Commonly known as: CATAPRES  Take 0.3 mg by mouth 3 (three) times daily.   donepezil  5 MG tablet Commonly known as: ARICEPT  Take 1 tablet (5 mg total) by mouth at bedtime.   doxazosin  2 MG tablet Commonly known as: CARDURA  Take 4 mg by mouth 2 (two) times daily.   Eliquis  5 MG Tabs tablet  Generic drug: apixaban  TAKE 1 TABLET TWICE A DAY   ipratropium-albuterol  0.5-2.5 (3) MG/3ML Soln Commonly known as: DUONEB Take 3 mLs by nebulization every 6 (six) hours as needed.   Iron  (Ferrous Sulfate ) 325 (65 Fe) MG Tabs Take 325 mg by mouth daily.   metoprolol  tartrate 50 MG tablet Commonly known as: LOPRESSOR  Take 1 tablet (50 mg total) by mouth 2 (two) times daily.   potassium chloride  10 MEQ CR capsule Commonly known as:  MICRO-K  Take 10 mEq by mouth daily.   sertraline  50 MG tablet Commonly known as: ZOLOFT  Take 1 tablet (50 mg total) by mouth daily.   torsemide  20 MG tablet Commonly known as: DEMADEX  Take 4 tablets (80 mg total) by mouth 2 (two) times daily.   Vitamin D3 50 MCG (2000 UT) Tabs Take 2,000 Units by mouth daily.         Objective:   BP (!) 164/77   Pulse 74   Ht 5' 10 (1.778 m)   Wt 189 lb (85.7 kg)   SpO2 98%   BMI 27.12 kg/m   Wt Readings from Last 3 Encounters:  05/29/24 189 lb (85.7 kg)  02/23/24 185 lb 9.6 oz (84.2 kg)  12/23/23 179 lb 9.6 oz (81.5 kg)    Physical Exam Physical Exam   MEASUREMENTS: Weight- 175-185. CHEST: Lungs clear to auscultation. CARDIOVASCULAR: Irregular rhythm, no murmurs. EXTREMITIES: Bilateral leg swelling, consistent with baseline.         Assessment & Plan:   Problem List Items Addressed This Visit       Cardiovascular and Mediastinum   Hypertension - Primary   Relevant Orders   CBC with Differential/Platelet   CMP14+EGFR   Lipid panel   Accelerated secondary hypertension   Relevant Orders   CBC with Differential/Platelet   CMP14+EGFR   Lipid panel   CHF (congestive heart failure) (HCC)   Relevant Orders   CBC with Differential/Platelet   CMP14+EGFR   Lipid panel   Paroxysmal atrial fibrillation (HCC)     Genitourinary   CKD (chronic kidney disease) stage 4, GFR 15-29 ml/min (HCC)   Relevant Orders   CBC with Differential/Platelet   CMP14+EGFR   Lipid panel     Other   Hyperlipidemia LDL goal <100       Chronic combined systolic and diastolic heart failure Chronic heart failure with stable fluid status. No acute exacerbation. - Monitor weight daily, especially in the morning before dressing. - Advised on dietary modifications to reduce sodium intake, particularly avoiding high-sodium foods like pork products and fried foods.  Chronic kidney disease, stage 4 Chronic kidney disease, stage 4.  Paroxysmal  atrial fibrillation Irregular heart rhythm consistent with paroxysmal atrial fibrillation.  Hypertension Blood pressure readings fluctuate between 140 to 160 mmHg. Current management effective but can improve.  Depression and anxiety Managed with sertraline . Considered increasing dose to manage symptoms better. Patient hesitant due to polypharmacy concerns. - Increased sertraline  dose from 50 mg to 100 mg to better manage anxiety and irritability.          Follow up plan: Return in about 3 months (around 08/29/2024), or if symptoms worsen or fail to improve, for Hypertension and cholesterol recheck.  Counseling provided for all of the vaccine components Orders Placed This Encounter  Procedures   CBC with Differential/Platelet   CMP14+EGFR   Lipid panel    Fonda Levins, MD [facility] 05/29/2024, 4:18 PM

## 2024-05-30 LAB — CMP14+EGFR
ALT: 16 IU/L (ref 0–44)
AST: 27 IU/L (ref 0–40)
Albumin: 3.5 g/dL — ABNORMAL LOW (ref 3.8–4.8)
Alkaline Phosphatase: 221 IU/L — ABNORMAL HIGH (ref 47–123)
BUN/Creatinine Ratio: 14 (ref 10–24)
BUN: 30 mg/dL — ABNORMAL HIGH (ref 8–27)
Bilirubin Total: 0.3 mg/dL (ref 0.0–1.2)
CO2: 26 mmol/L (ref 20–29)
Calcium: 8 mg/dL — ABNORMAL LOW (ref 8.6–10.2)
Chloride: 97 mmol/L (ref 96–106)
Creatinine, Ser: 2.08 mg/dL — ABNORMAL HIGH (ref 0.76–1.27)
Globulin, Total: 2.4 g/dL (ref 1.5–4.5)
Glucose: 104 mg/dL — ABNORMAL HIGH (ref 70–99)
Potassium: 4 mmol/L (ref 3.5–5.2)
Sodium: 135 mmol/L (ref 134–144)
Total Protein: 5.9 g/dL — ABNORMAL LOW (ref 6.0–8.5)
eGFR: 32 mL/min/1.73 — ABNORMAL LOW (ref >59)

## 2024-05-30 LAB — CBC WITH DIFFERENTIAL/PLATELET
Basophils Absolute: 0.1 x10E3/uL (ref 0.0–0.2)
Basos: 1 %
EOS (ABSOLUTE): 0.3 x10E3/uL (ref 0.0–0.4)
Eos: 3 %
Hematocrit: 30.3 % — ABNORMAL LOW (ref 37.5–51.0)
Hemoglobin: 9.4 g/dL — ABNORMAL LOW (ref 13.0–17.7)
Immature Grans (Abs): 0 x10E3/uL (ref 0.0–0.1)
Immature Granulocytes: 0 %
Lymphocytes Absolute: 0.8 x10E3/uL (ref 0.7–3.1)
Lymphs: 11 %
MCH: 26.9 pg (ref 26.6–33.0)
MCHC: 31 g/dL — ABNORMAL LOW (ref 31.5–35.7)
MCV: 87 fL (ref 79–97)
Monocytes Absolute: 0.5 x10E3/uL (ref 0.1–0.9)
Monocytes: 7 %
Neutrophils Absolute: 5.9 x10E3/uL (ref 1.4–7.0)
Neutrophils: 77 %
Platelets: 281 x10E3/uL (ref 150–450)
RBC: 3.5 x10E6/uL — ABNORMAL LOW (ref 4.14–5.80)
RDW: 13.7 % (ref 11.6–15.4)
WBC: 7.6 x10E3/uL (ref 3.4–10.8)

## 2024-05-30 LAB — LIPID PANEL
Chol/HDL Ratio: 2.1 ratio (ref 0.0–5.0)
Cholesterol, Total: 129 mg/dL (ref 100–199)
HDL: 61 mg/dL (ref >39)
LDL Chol Calc (NIH): 53 mg/dL (ref 0–99)
Triglycerides: 73 mg/dL (ref 0–149)
VLDL Cholesterol Cal: 15 mg/dL (ref 5–40)

## 2024-06-02 ENCOUNTER — Ambulatory Visit: Payer: Self-pay | Admitting: Family Medicine

## 2024-06-28 DIAGNOSIS — N184 Chronic kidney disease, stage 4 (severe): Secondary | ICD-10-CM | POA: Diagnosis not present

## 2024-08-09 ENCOUNTER — Other Ambulatory Visit: Payer: Self-pay | Admitting: Cardiology

## 2024-08-09 DIAGNOSIS — I4819 Other persistent atrial fibrillation: Secondary | ICD-10-CM

## 2024-08-30 ENCOUNTER — Ambulatory Visit: Admitting: Family Medicine

## 2024-10-16 ENCOUNTER — Ambulatory Visit: Admitting: Family Medicine

## 2025-04-16 ENCOUNTER — Ambulatory Visit: Admitting: Urology
